# Patient Record
Sex: Male | Born: 1950 | Race: White | Hispanic: No | Marital: Married | Smoking: Former smoker
Health system: Southern US, Community
[De-identification: ages and names within clinical notes are randomized; demographics above are authoritative.]

## PROBLEM LIST (undated history)

## (undated) DIAGNOSIS — I10 Essential (primary) hypertension: Secondary | ICD-10-CM

## (undated) DIAGNOSIS — J45909 Unspecified asthma, uncomplicated: Secondary | ICD-10-CM

## (undated) DIAGNOSIS — J690 Pneumonitis due to inhalation of food and vomit: Secondary | ICD-10-CM

## (undated) DIAGNOSIS — D649 Anemia, unspecified: Secondary | ICD-10-CM

## (undated) DIAGNOSIS — N1831 Chronic kidney disease, stage 3a: Secondary | ICD-10-CM

## (undated) DIAGNOSIS — R079 Chest pain, unspecified: Secondary | ICD-10-CM

## (undated) DIAGNOSIS — J449 Chronic obstructive pulmonary disease, unspecified: Secondary | ICD-10-CM

## (undated) DIAGNOSIS — K219 Gastro-esophageal reflux disease without esophagitis: Secondary | ICD-10-CM

## (undated) DIAGNOSIS — C801 Malignant (primary) neoplasm, unspecified: Secondary | ICD-10-CM

## (undated) DIAGNOSIS — R7303 Prediabetes: Secondary | ICD-10-CM

## (undated) DIAGNOSIS — I2510 Atherosclerotic heart disease of native coronary artery without angina pectoris: Secondary | ICD-10-CM

## (undated) DIAGNOSIS — M869 Osteomyelitis, unspecified: Secondary | ICD-10-CM

## (undated) DIAGNOSIS — Z8719 Personal history of other diseases of the digestive system: Secondary | ICD-10-CM

## (undated) DIAGNOSIS — E785 Hyperlipidemia, unspecified: Secondary | ICD-10-CM

## (undated) DIAGNOSIS — I779 Disorder of arteries and arterioles, unspecified: Secondary | ICD-10-CM

## (undated) DIAGNOSIS — I219 Acute myocardial infarction, unspecified: Secondary | ICD-10-CM

## (undated) DIAGNOSIS — F909 Attention-deficit hyperactivity disorder, unspecified type: Secondary | ICD-10-CM

## (undated) DIAGNOSIS — R911 Solitary pulmonary nodule: Secondary | ICD-10-CM

## (undated) DIAGNOSIS — M2550 Pain in unspecified joint: Secondary | ICD-10-CM

## (undated) DIAGNOSIS — Z87442 Personal history of urinary calculi: Secondary | ICD-10-CM

## (undated) DIAGNOSIS — I00 Rheumatic fever without heart involvement: Secondary | ICD-10-CM

## (undated) DIAGNOSIS — M810 Age-related osteoporosis without current pathological fracture: Secondary | ICD-10-CM

## (undated) HISTORY — DX: Pain in unspecified joint: M25.50

## (undated) HISTORY — PX: COLONOSCOPY WITH ESOPHAGOGASTRODUODENOSCOPY (EGD): SHX5779

## (undated) HISTORY — PX: CYSTOSCOPY/RETROGRADE/URETEROSCOPY/STONE EXTRACTION WITH BASKET: SHX5317

## (undated) HISTORY — PX: OTHER SURGICAL HISTORY: SHX169

## (undated) HISTORY — DX: Osteomyelitis, unspecified: M86.9

## (undated) HISTORY — DX: Chest pain, unspecified: R07.9

## (undated) HISTORY — DX: Rheumatic fever without heart involvement: I00

## (undated) HISTORY — PX: ESOPHAGECTOMY: SUR457

---

## 2007-02-18 ENCOUNTER — Ambulatory Visit (HOSPITAL_COMMUNITY): Admission: RE | Admit: 2007-02-18 | Discharge: 2007-02-18 | Payer: Self-pay | Admitting: Urology

## 2007-02-18 IMAGING — CR DG ABDOMEN 1V
1 series · 1 of 1 positions shown · non-contrast
Comparison: none

HISTORY: Left ureteral calculus, pre lithotripsy

[view not recorded]
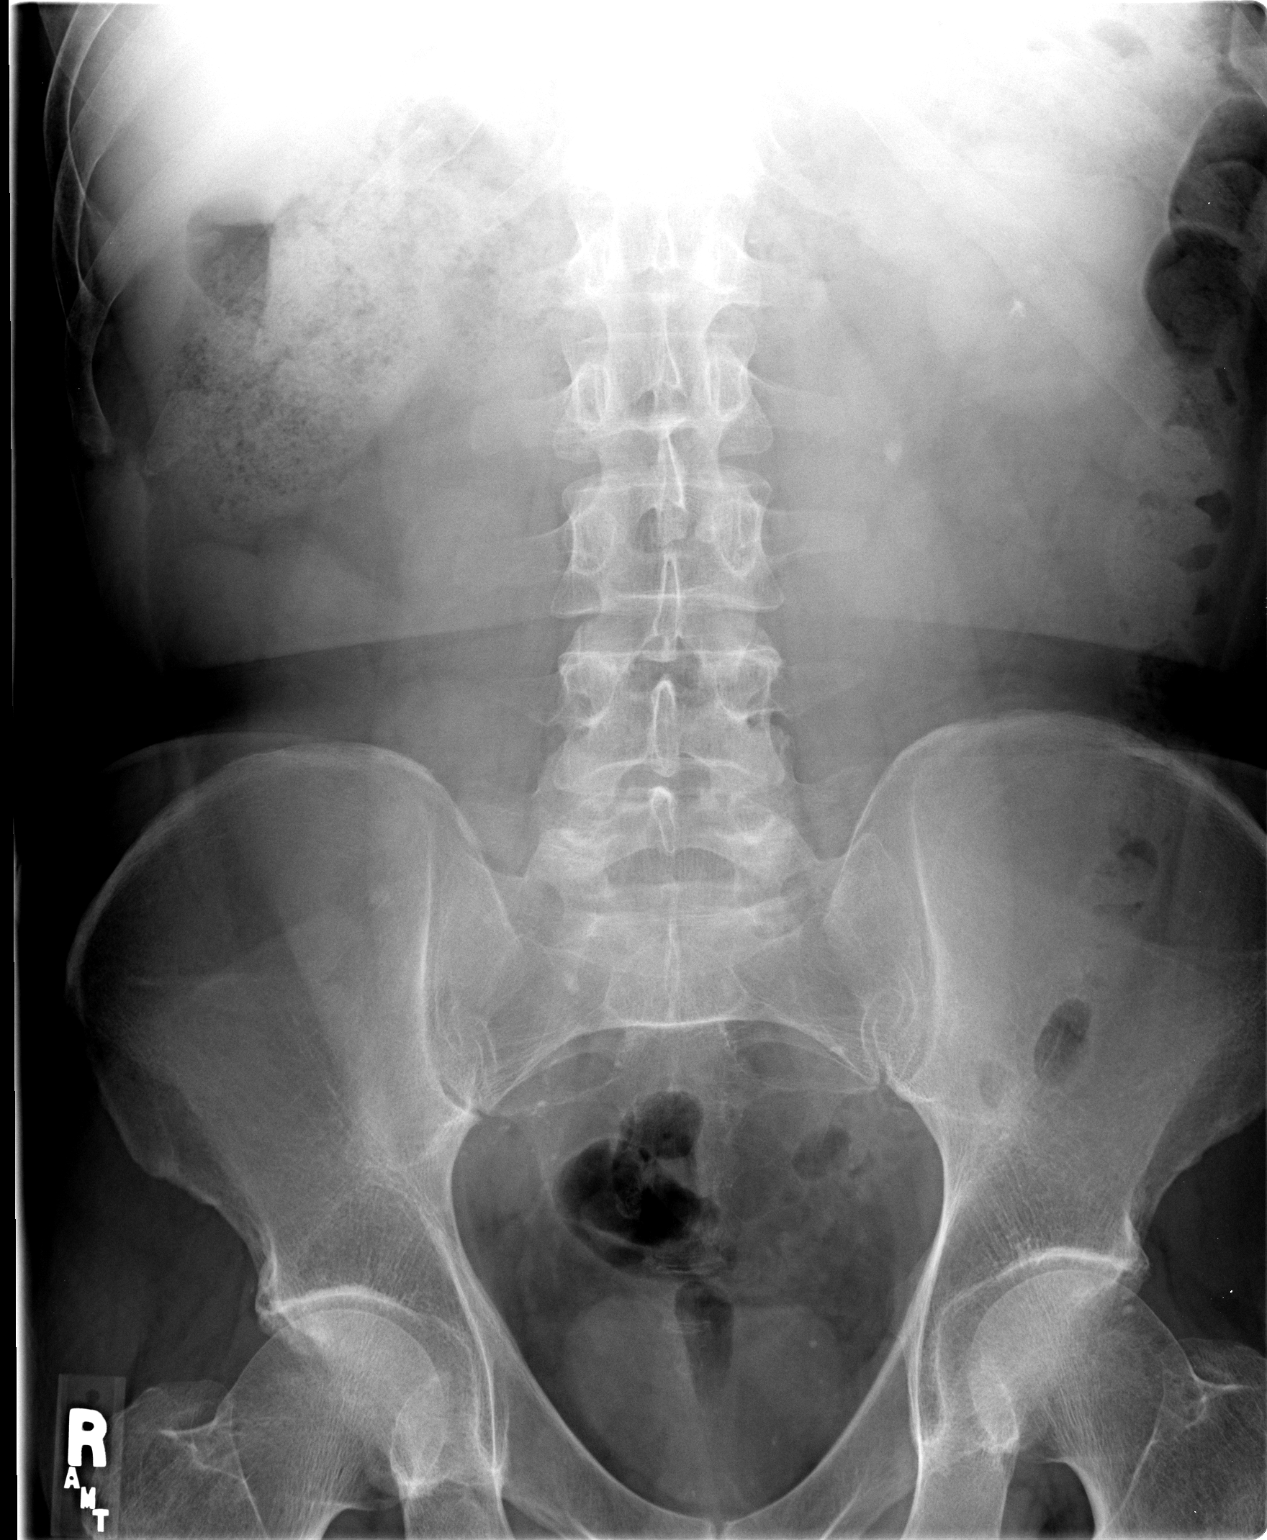

[1 of 1 positions shown; findings below may reference images not displayed]

ABDOMEN ONE VIEW:

6 mm diameter calculus left paraspinal at the L2-L3 disc space LEVEL,  either AT
left ureteropelvic junction or very proximal left ureter.
Tiny pelvic phleboliths.
Additional calculus in left kidney, 5 x 5 mm.
Question additional calculus 3 mm diameter mid left kidney.
Mildly demineralization.
Calcification projects over the right sacrum, uncertain etiology, 6 mm diameter.
Bowel gas pattern normal.
IMPRESSION: Two small left renal calculi with 6 mm diameter calculus at either the left
renal pelvic junction or proximal left ureter.

## 2007-02-20 ENCOUNTER — Ambulatory Visit (HOSPITAL_COMMUNITY): Admission: RE | Admit: 2007-02-20 | Discharge: 2007-02-20 | Payer: Self-pay | Admitting: Urology

## 2007-04-27 ENCOUNTER — Ambulatory Visit (HOSPITAL_COMMUNITY): Admission: RE | Admit: 2007-04-27 | Discharge: 2007-04-27 | Payer: Self-pay | Admitting: Urology

## 2007-04-27 IMAGING — CR DG ABDOMEN 1V
2 series · 2 of 2 positions shown · non-contrast
Comparison: [DATE]

CLINICAL DATA: Left ureteral calculus

ABDOMEN - 1 VIEW

[view not recorded (1 of 2)]
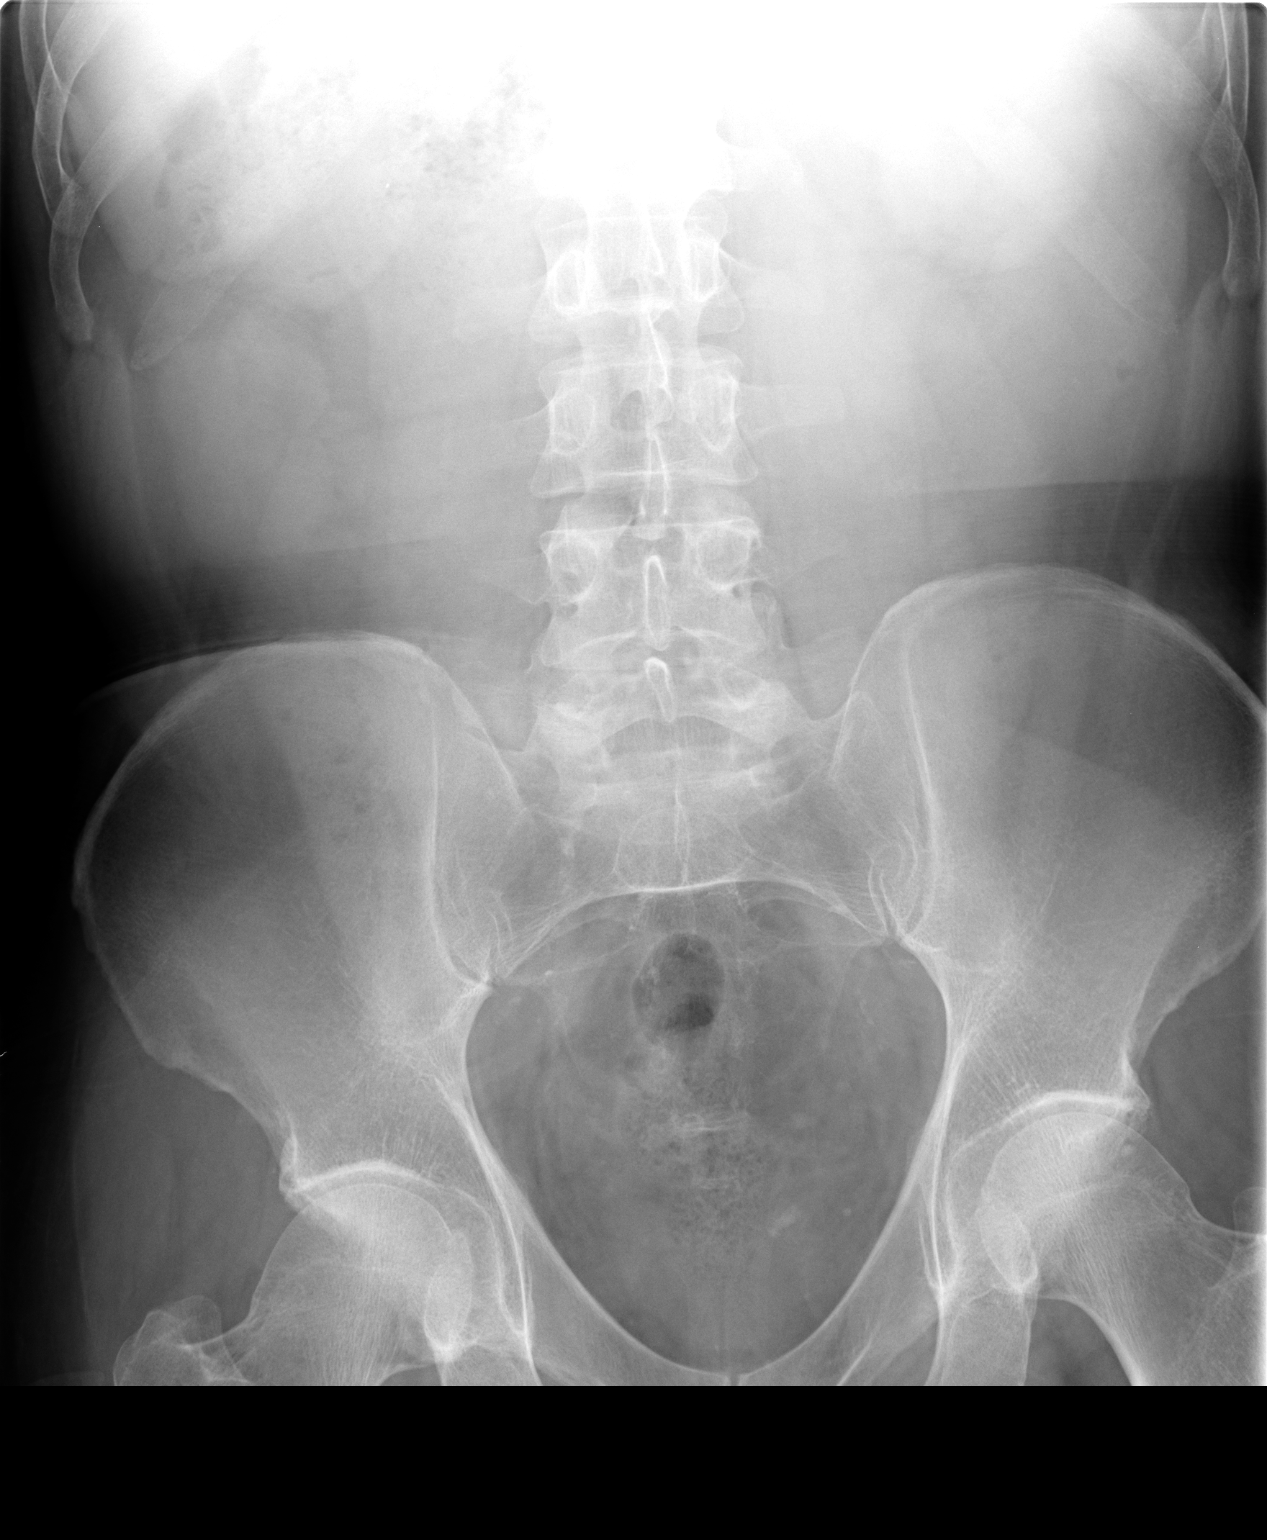

[view not recorded (2 of 2)]
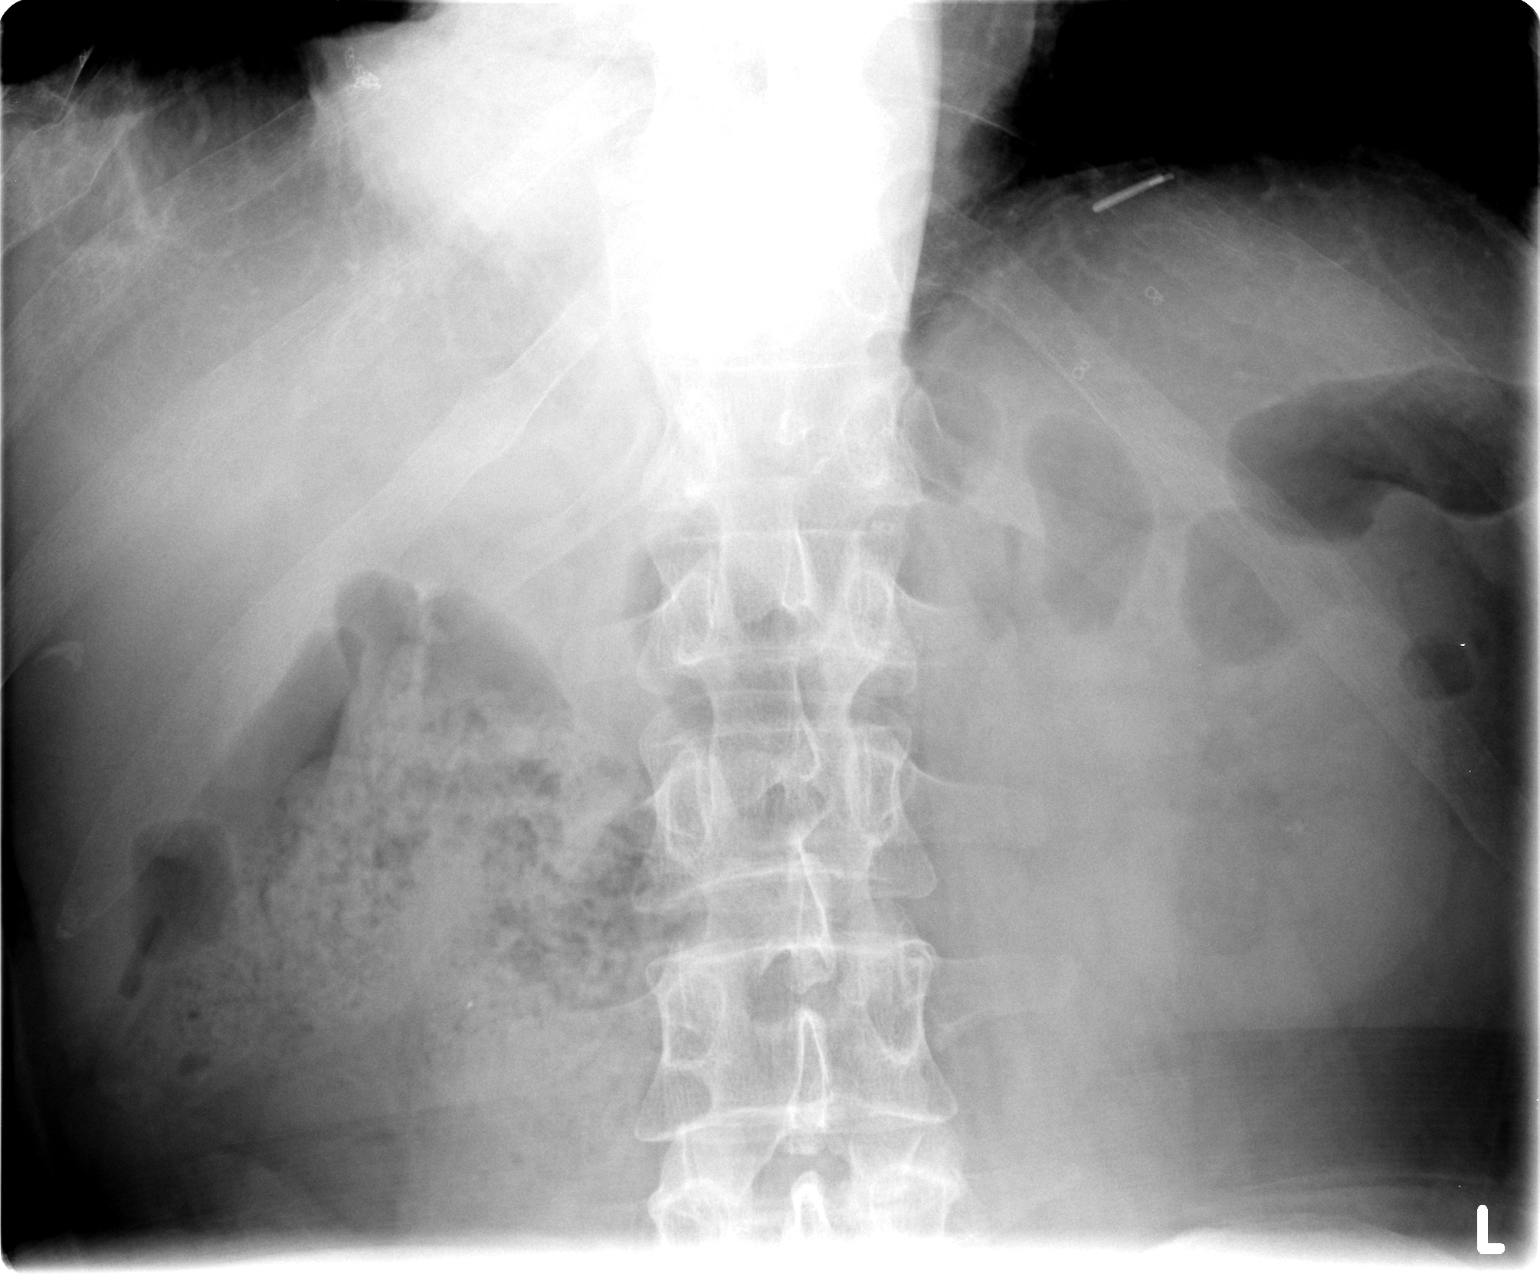

[2 of 2 positions shown; findings below may reference images not displayed]

FINDINGS: Small pelvic phleboliths.
Additional calcifications in left pelvis are new since previous
exam compatible with distal left ureteral calculi, measuring up to
7 x 4 and 2 x 2 mm.
Minimal vascular calcification.
Tiny calculus 2-3 mm diameter in mid left kidney.
No definite right side calculi, stool artifacts projecting over
right renal fossa.
Bony demineralization.
Bowel gas pattern normal.
IMPRESSION: Suspect two small distal left ureteral calculi.

## 2007-05-04 ENCOUNTER — Ambulatory Visit (HOSPITAL_COMMUNITY): Admission: RE | Admit: 2007-05-04 | Discharge: 2007-05-04 | Payer: Self-pay | Admitting: Urology

## 2007-05-04 IMAGING — CR DG ABDOMEN 1V
1 series · 1 of 1 positions shown · non-contrast
Comparison: [DATE]

CLINICAL DATA: Left ureteral calculus

ABDOMEN - 1 VIEW

[view not recorded]
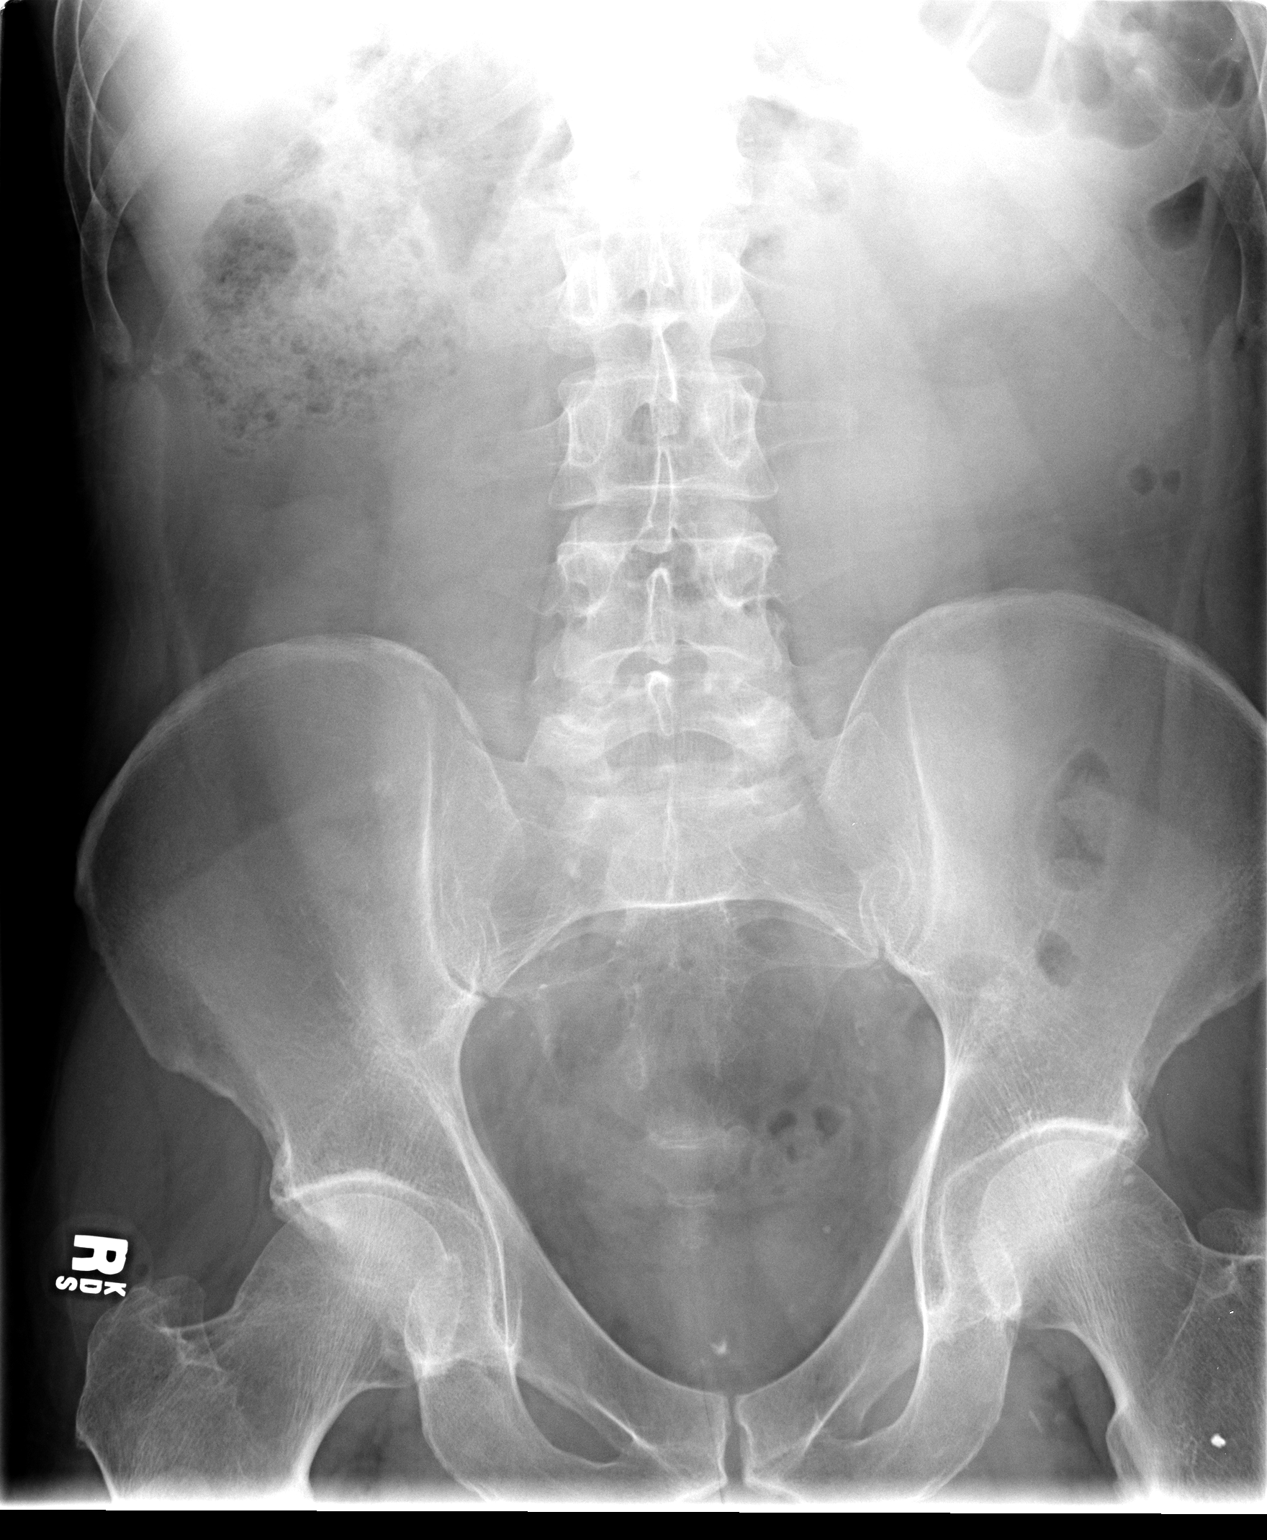

[1 of 1 positions shown; findings below may reference images not displayed]

FINDINGS: Previously identified tiny calculi in distal left ureter are no
longer seen at that site.
Single new calcification is seen dependently at the base of the
bladder which may represent passed stone within the bladder lumen,
not seen previously, measuring 5 x 3 mm.
Minimal vascular calcification.
Diffuse bony demineralization.
Bowel gas pattern normal.
IMPRESSION: Previously seen distal left ureteral calculus no longer identified,
with questionable visualization of a passed calculus in urinary
bladder.

## 2007-08-20 ENCOUNTER — Ambulatory Visit (HOSPITAL_COMMUNITY): Admission: RE | Admit: 2007-08-20 | Discharge: 2007-08-20 | Payer: Self-pay | Admitting: Urology

## 2008-08-29 ENCOUNTER — Ambulatory Visit (HOSPITAL_COMMUNITY): Admission: RE | Admit: 2008-08-29 | Discharge: 2008-08-29 | Payer: Self-pay | Admitting: Urology

## 2008-08-29 IMAGING — CR DG ABDOMEN 1V
1 series · 1 of 1 positions shown · non-contrast
Comparison: [DATE] and earlier.

CLINICAL DATA: 58-year-old male with right ureteral calculus.

ABDOMEN - 1 VIEW

[view not recorded]
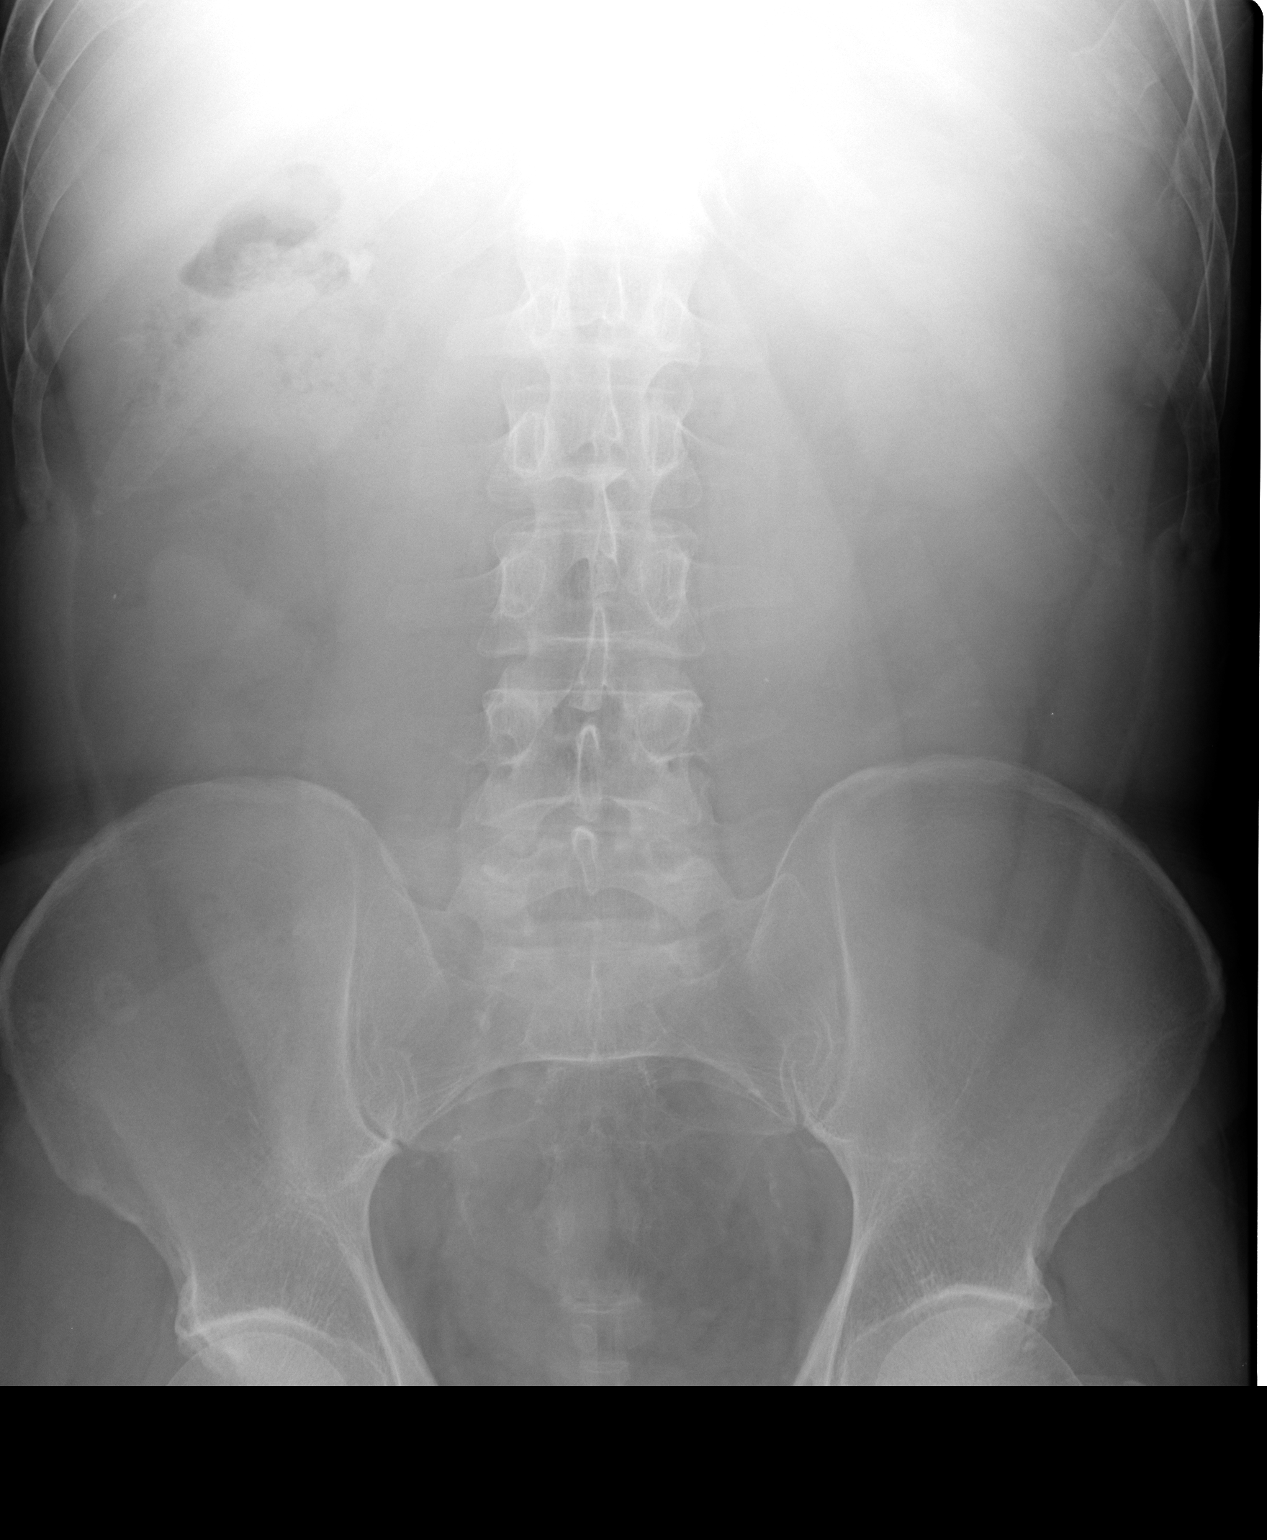

[1 of 1 positions shown; findings below may reference images not displayed]

FINDINGS: Supine AP view of the abdomen.  Paucity of bowel gas.
Visceral contours are within normal limits.  Right nephrolithiasis
suspected measuring 9 x 7 mm.  Alternatively, this could be
artifact due to enteric contents of the hepatic flexure.  Stable
small sclerotic foci, probably related to calcified
atherosclerosis, projecting over the right hemi sacrum.  No
discrete ureteral calculus identified.  No definite left
nephrolithiasis. No acute osseous abnormality identified.
IMPRESSION: 1.  No definite ureteral calculus identified, correlation with
cross sectional imaging would be helpful.
2.  Right nephrolithiasis suspected measuring 7 x 9 mm at the level
of the mid pole.

## 2008-09-02 ENCOUNTER — Ambulatory Visit (HOSPITAL_COMMUNITY): Admission: RE | Admit: 2008-09-02 | Discharge: 2008-09-02 | Payer: Self-pay | Admitting: Urology

## 2008-09-02 IMAGING — US US RENAL
1 series · 14 of 22 positions shown · non-contrast
Comparison: KUB [DATE]

CLINICAL DATA: Right renal stone.

RENAL/URINARY TRACT ULTRASOUND COMPLETE

[Series 1: unknown · 0.27mm/px · 14 of 22 slices shown]
[im 1/22]
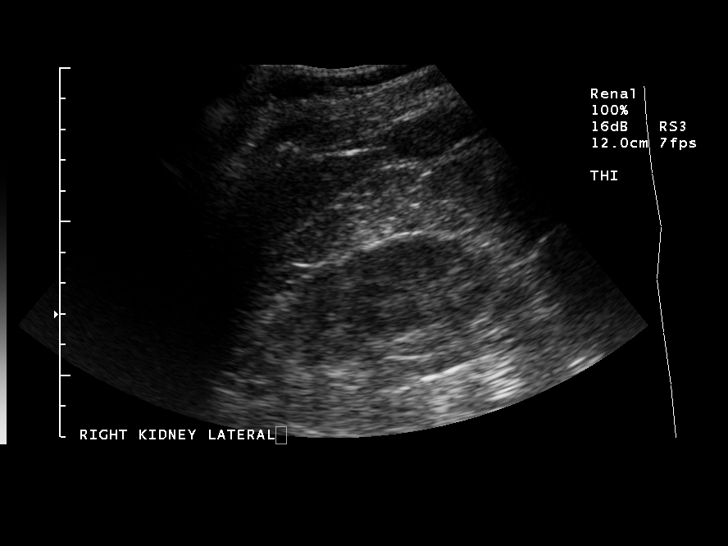
[im 3/22]
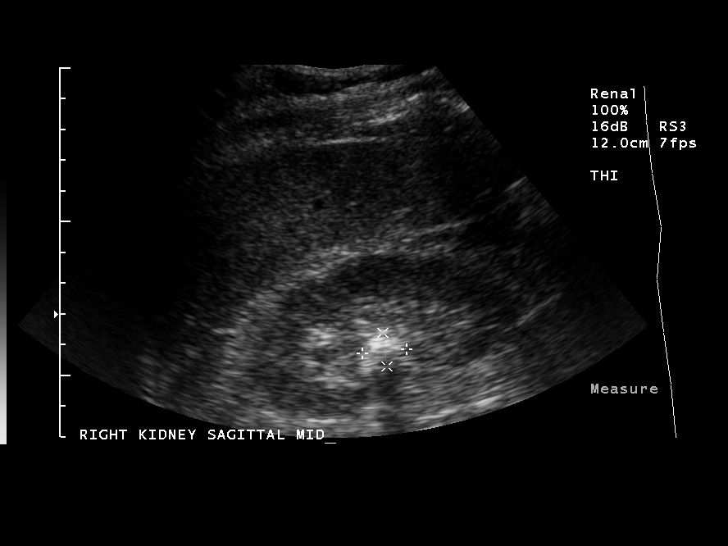
[im 4/22]
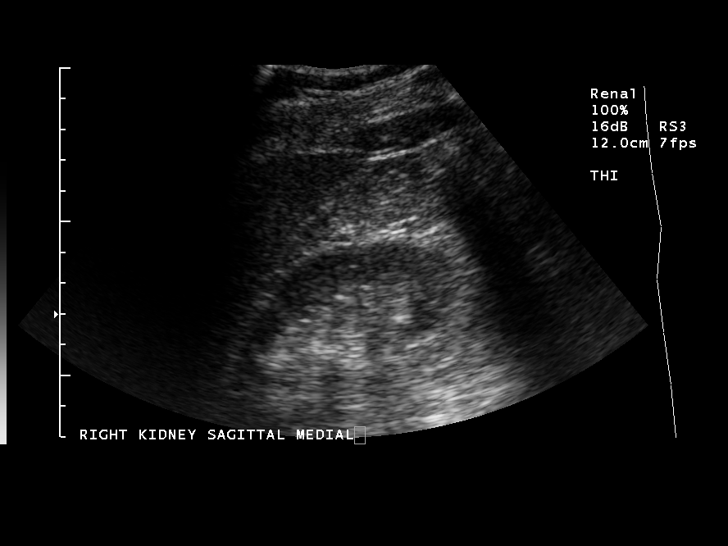
[im 6/22]
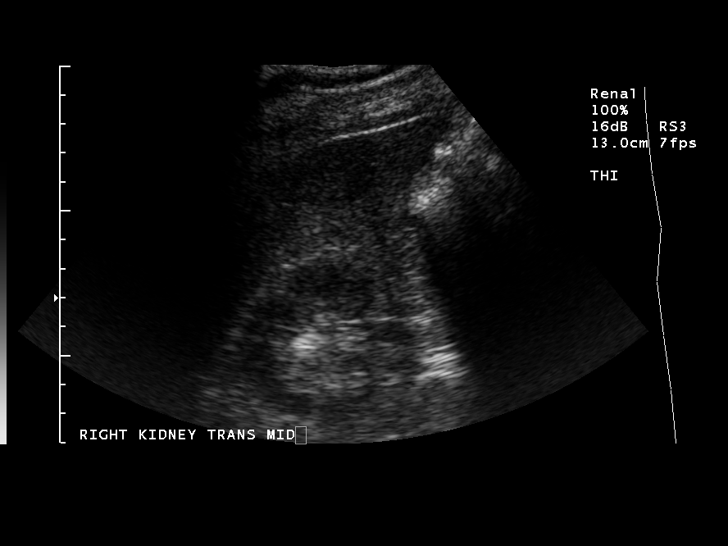
[im 8/22]
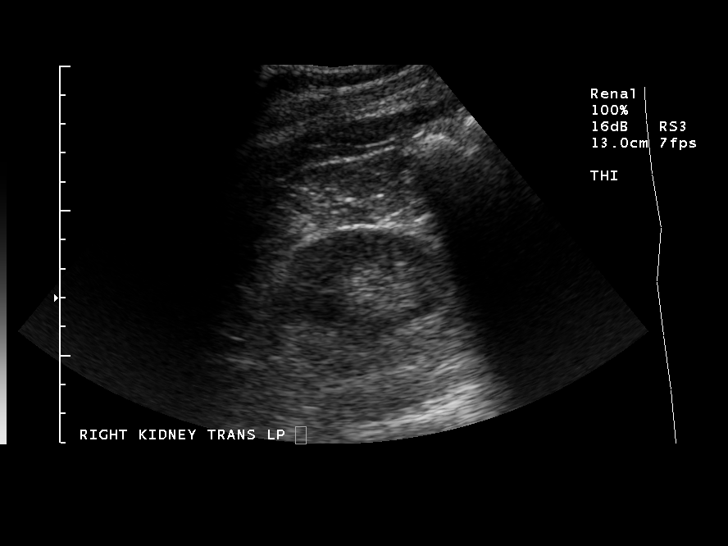
[im 9/22]
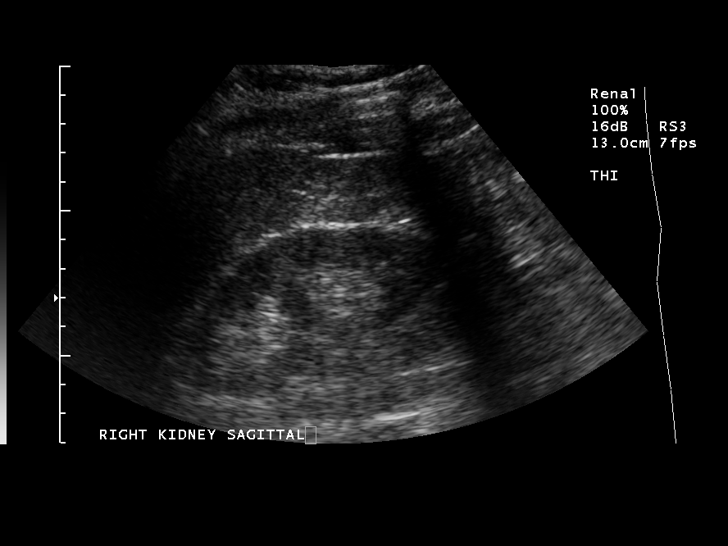
[im 11/22]
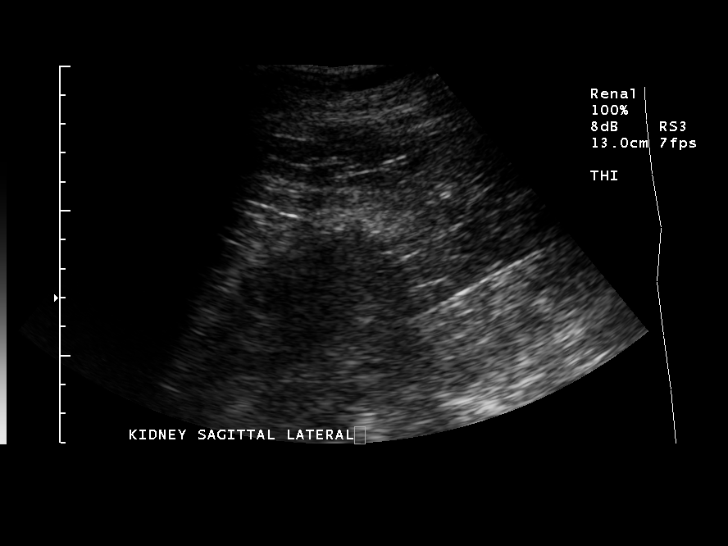
[im 12/22]
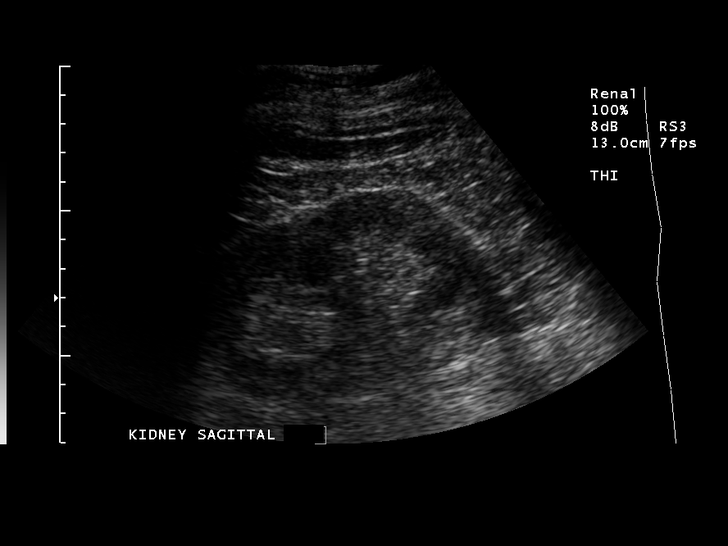
[im 14/22]
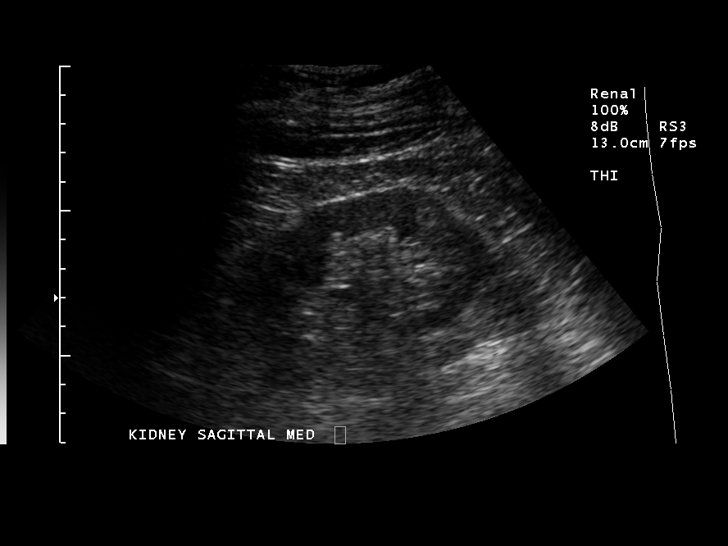
[im 15/22]
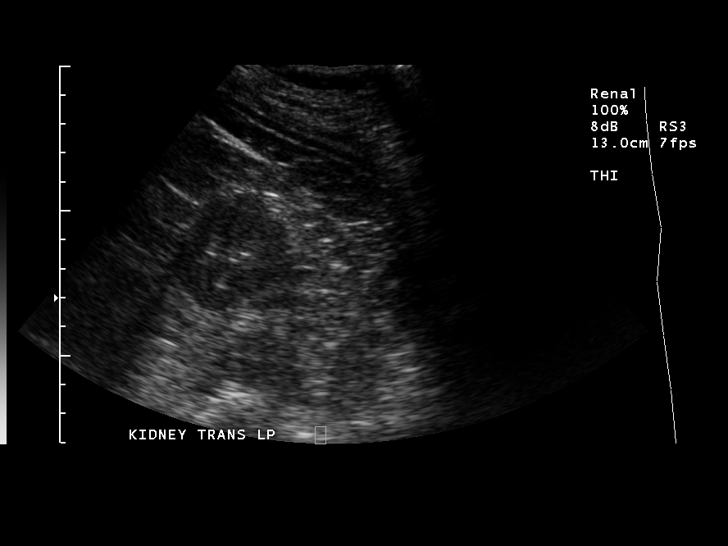
[im 17/22]
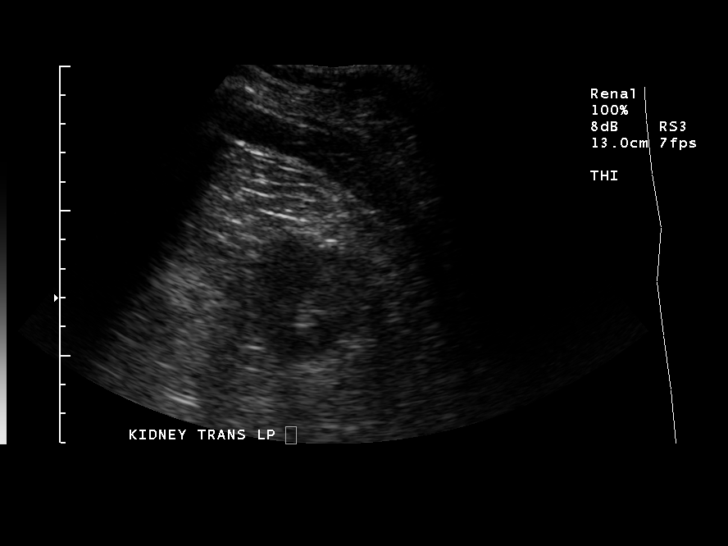
[im 19/22]
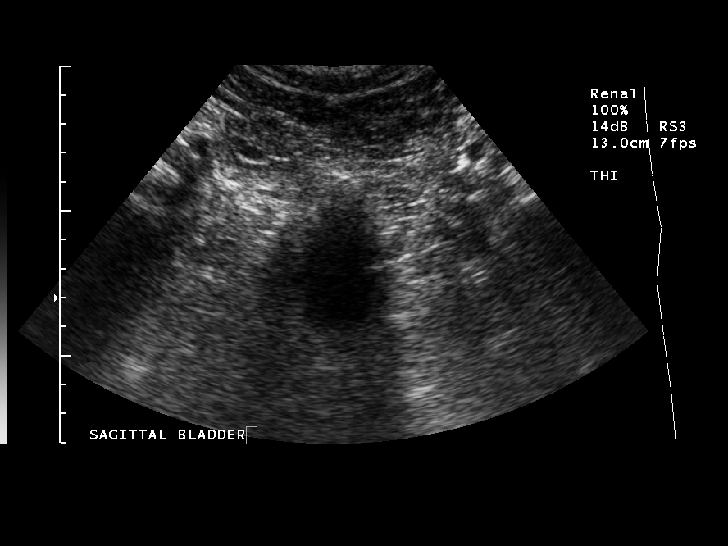
[im 20/22]
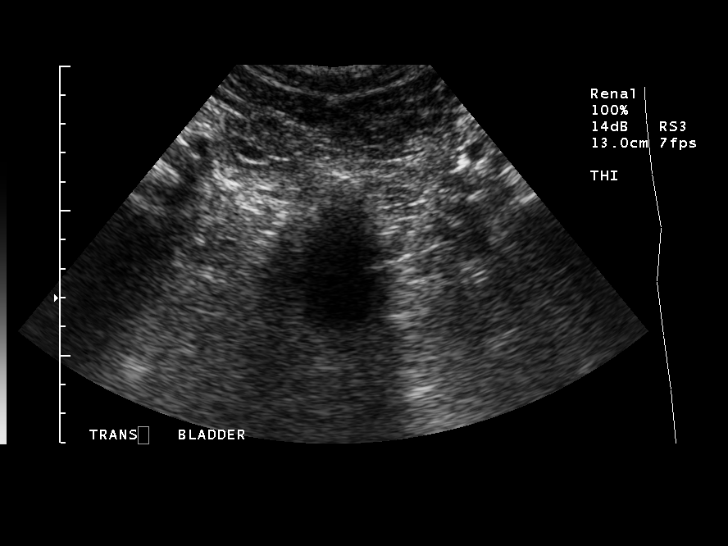
[im 22/22]
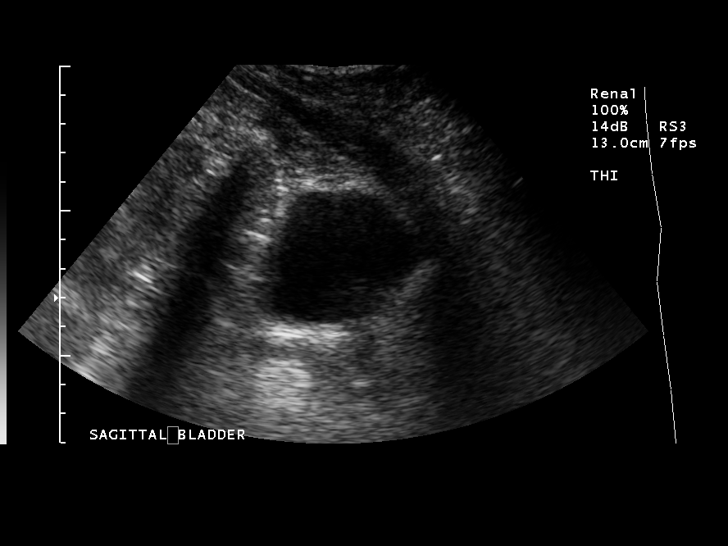

[14 of 22 positions shown; findings below may reference images not displayed]

FINDINGS: Right Kidney:  Measures 10.1 cm.  A 1.4 x 0.8 x 1.4 cm echogenic
lesion with posterior acoustic shadowing is seen in the mid pole of
the right kidney.  No obstruction.

Left Kidney:  Measures 10.1 cm, negative.

Bladder:  Under distended, otherwise unremarkable.
IMPRESSION: Nonobstructing right renal stone.

## 2008-10-24 ENCOUNTER — Ambulatory Visit (HOSPITAL_COMMUNITY): Admission: RE | Admit: 2008-10-24 | Discharge: 2008-10-24 | Payer: Self-pay | Admitting: Urology

## 2008-10-24 IMAGING — CR DG ABDOMEN 1V
1 series · 1 of 1 positions shown · non-contrast
Comparison: Renal ultrasound [DATE].  Abdominal plain film
[DATE].

CLINICAL DATA: Right flank pain.  History of kidney stones.

ABDOMEN - 1 VIEW

[view not recorded]
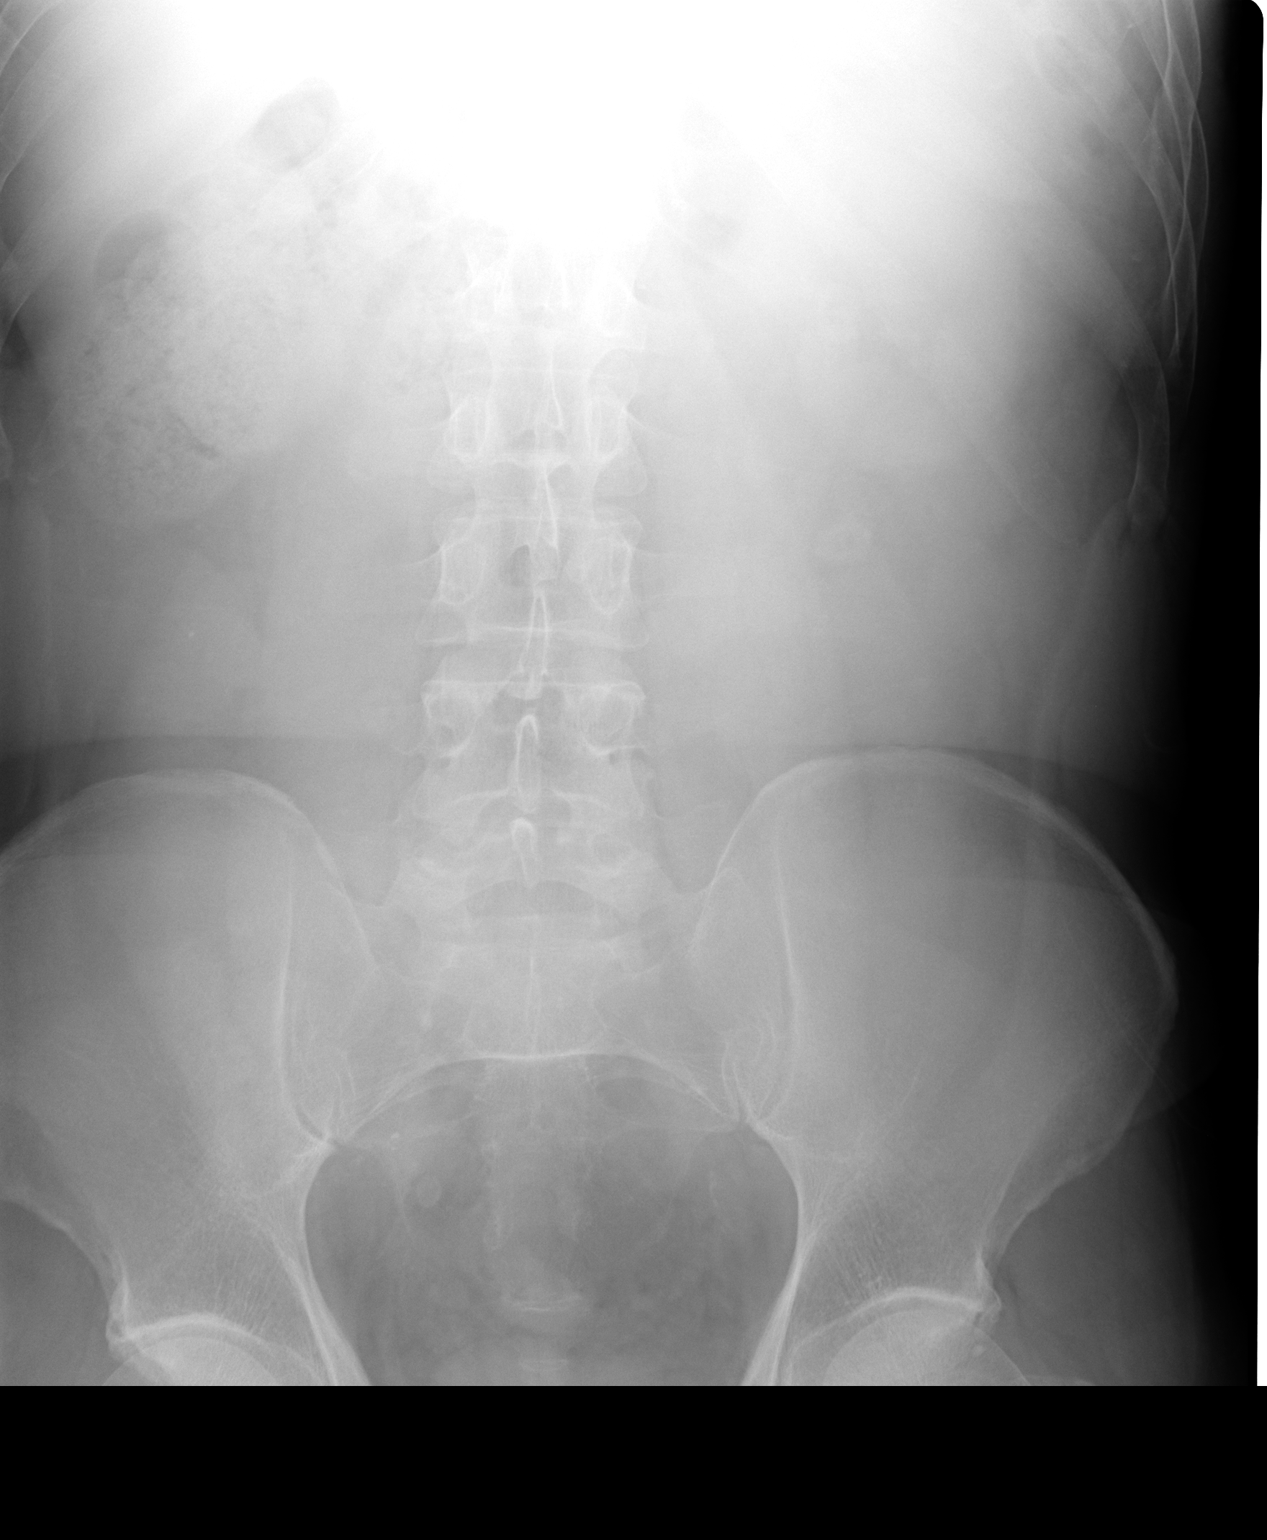

[1 of 1 positions shown; findings below may reference images not displayed]

FINDINGS: No radiopaque stones are identified within the abdomen.
There is a peripherally calcified density in the anatomic pelvis
which likely represents a phlebolith.  There is stool overlying the
right kidney.
IMPRESSION: 1.  No definite radiopaque stones identified over the expected
location of the right kidney or ureter.
2.  Rounded calcification in the anatomic pelvis likely reflects a
phlebolith.

## 2009-08-21 ENCOUNTER — Ambulatory Visit (HOSPITAL_COMMUNITY): Admission: RE | Admit: 2009-08-21 | Discharge: 2009-08-21 | Payer: Self-pay | Admitting: Urology

## 2009-08-21 IMAGING — CR DG ABDOMEN 1V
2 series · 2 of 2 positions shown · non-contrast
Comparison: [DATE]

CLINICAL DATA: Kidney stone.

ABDOMEN - 1 VIEW

[view not recorded (1 of 2)]
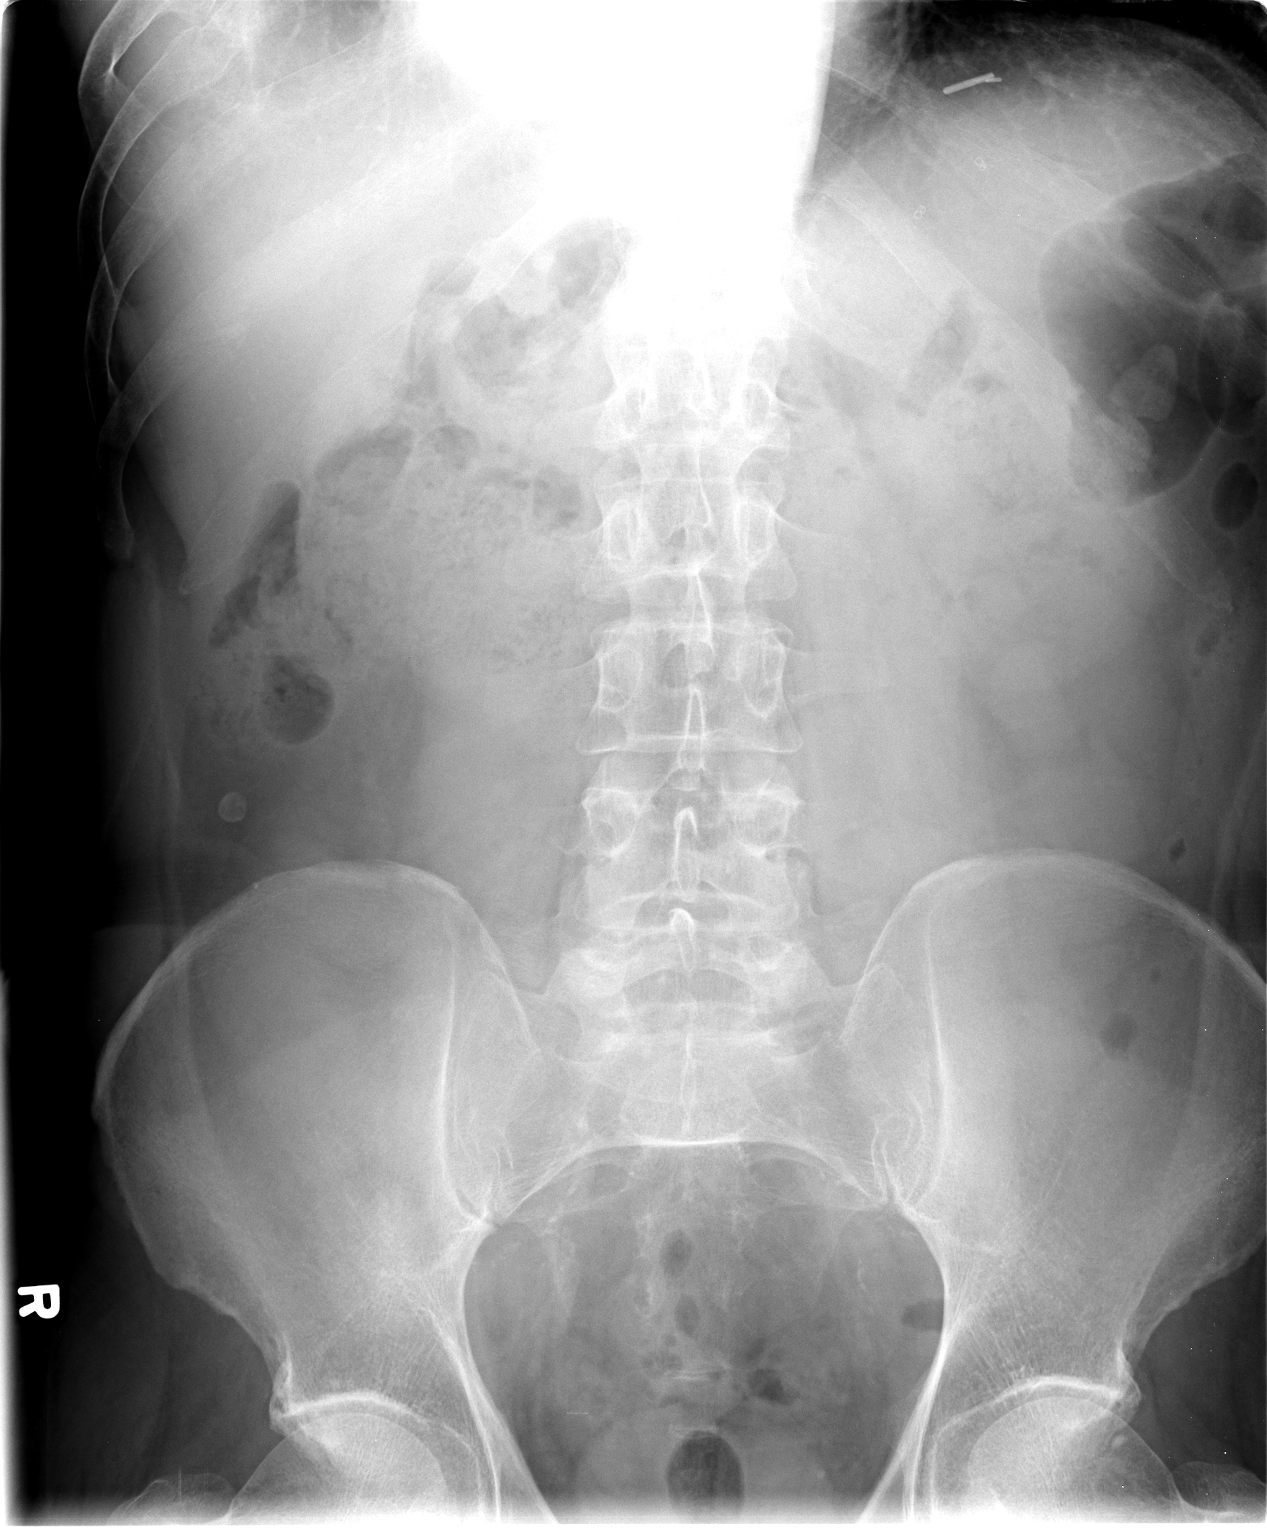

[view not recorded (2 of 2)]
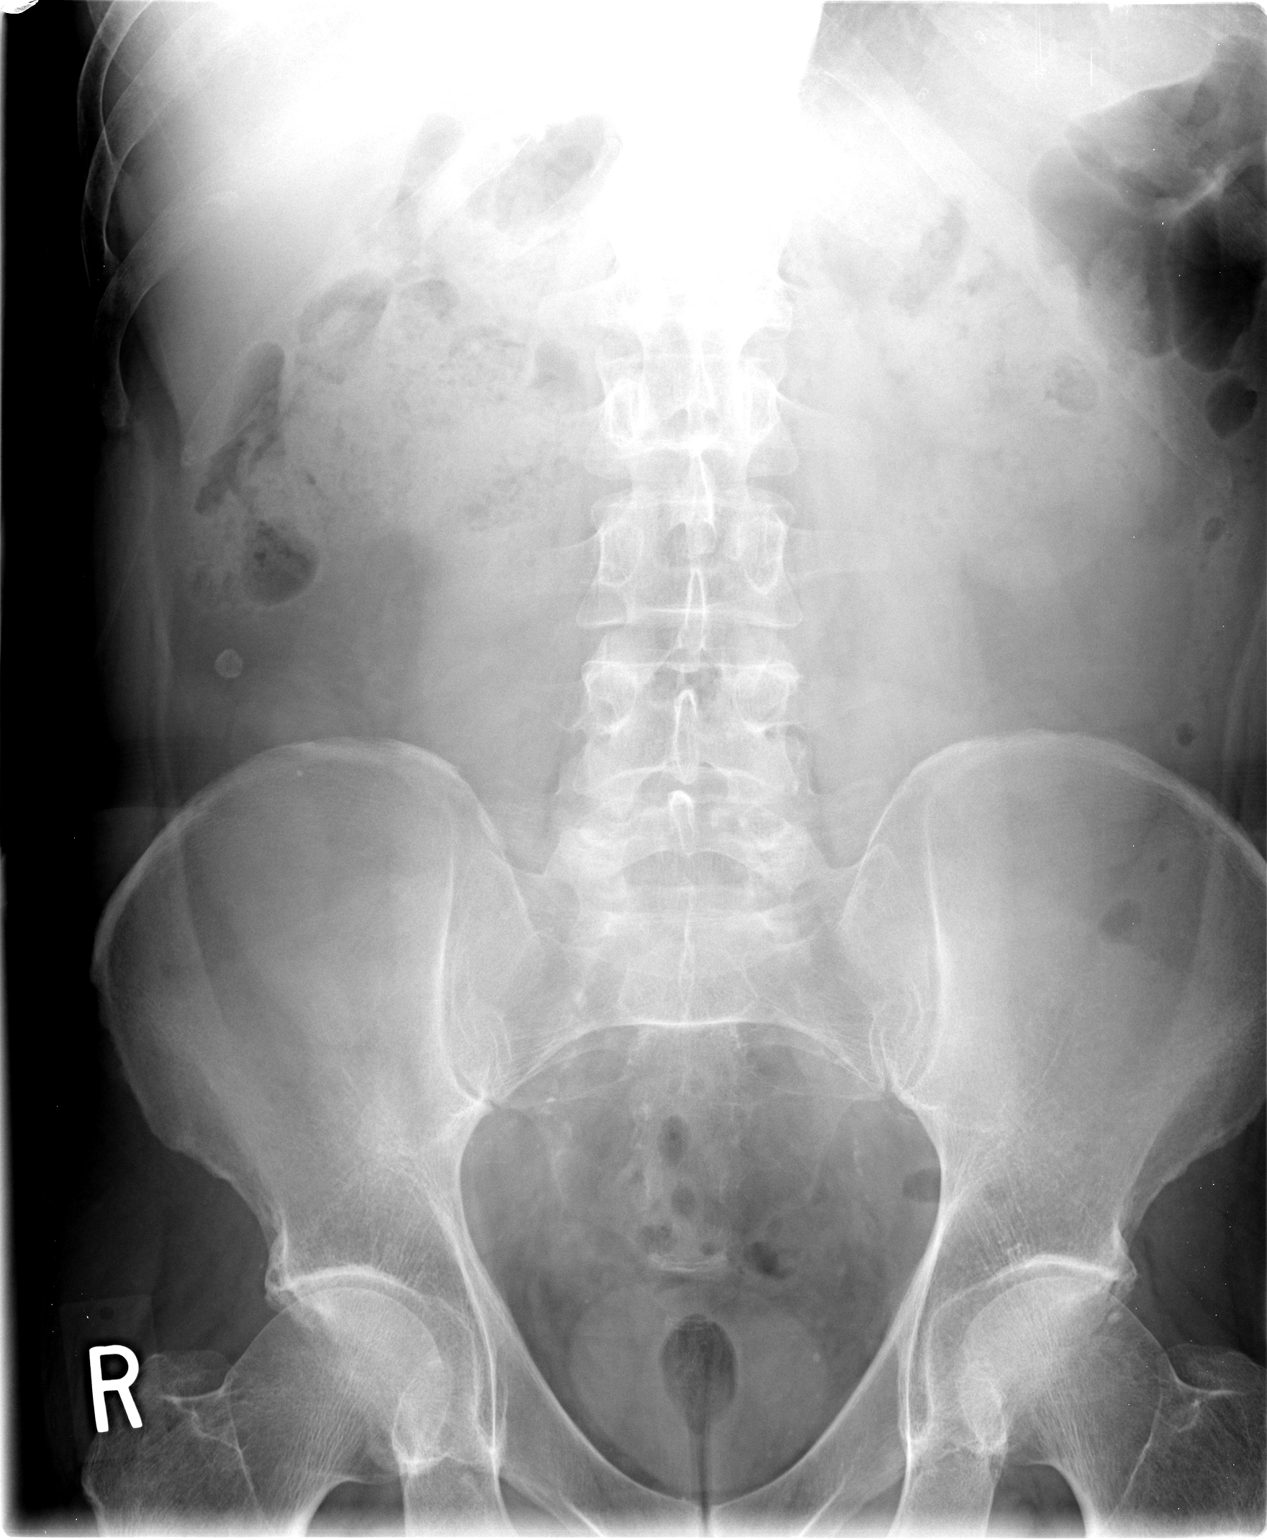

[2 of 2 positions shown; findings below may reference images not displayed]

FINDINGS: No definite radiopaque calcifications over the kidneys or
expected course of the ureters.  Stable phlebolith within the left
anatomic pelvis.  There is a normal bowel gas pattern.  No free
air, organomegaly or suspicious calcification.
IMPRESSION: No definite urinary tract calculi.  No acute findings.

## 2010-02-19 ENCOUNTER — Other Ambulatory Visit (HOSPITAL_COMMUNITY): Payer: Self-pay | Admitting: Urology

## 2010-02-19 ENCOUNTER — Ambulatory Visit (HOSPITAL_COMMUNITY)
Admission: RE | Admit: 2010-02-19 | Discharge: 2010-02-19 | Disposition: A | Discharge status: Home / Self Care | Payer: 59 | Source: Ambulatory Visit | Attending: Urology | Admitting: Urology

## 2010-02-19 DIAGNOSIS — R109 Unspecified abdominal pain: Secondary | ICD-10-CM | POA: Insufficient documentation

## 2010-02-19 DIAGNOSIS — N200 Calculus of kidney: Secondary | ICD-10-CM

## 2010-02-19 IMAGING — CR DG ABDOMEN 1V
2 series · 2 of 2 positions shown · non-contrast
Comparison: [DATE]

CLINICAL DATA: Right renal calculus

ABDOMEN - 1 VIEW

[view not recorded (1 of 2)]
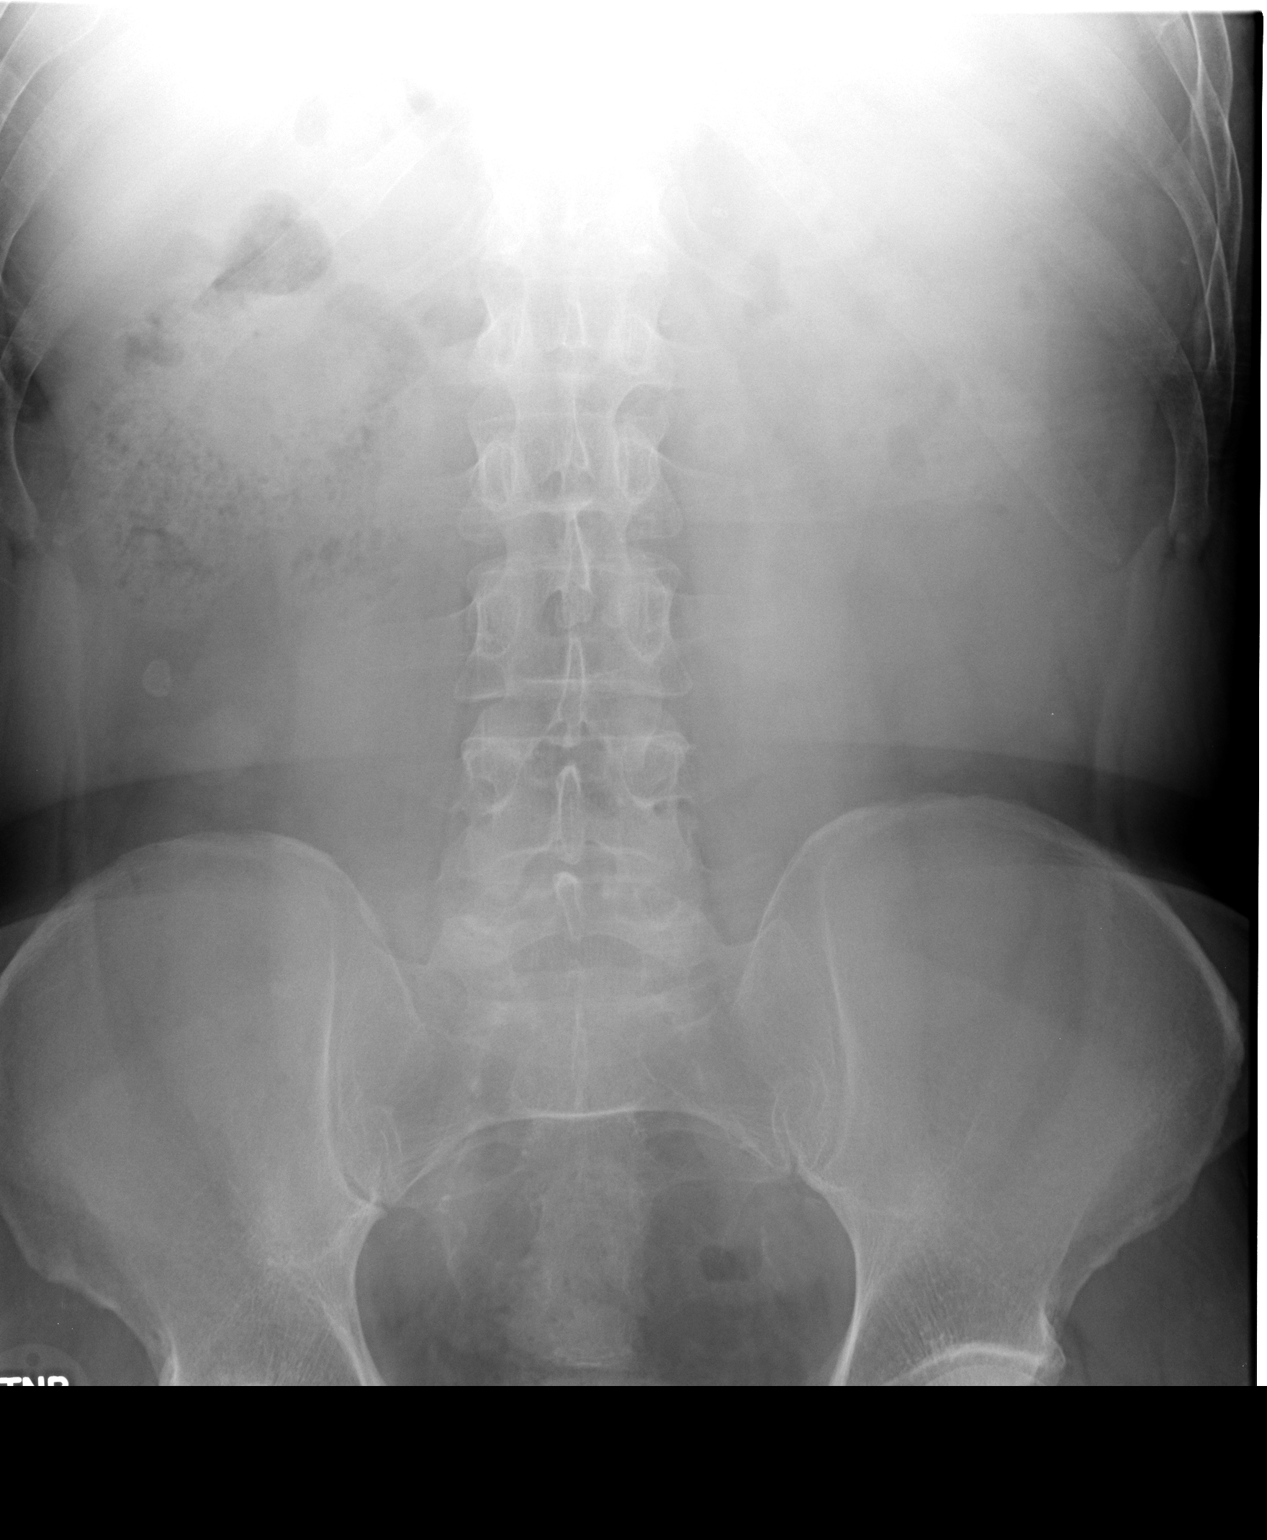

[view not recorded (2 of 2)]
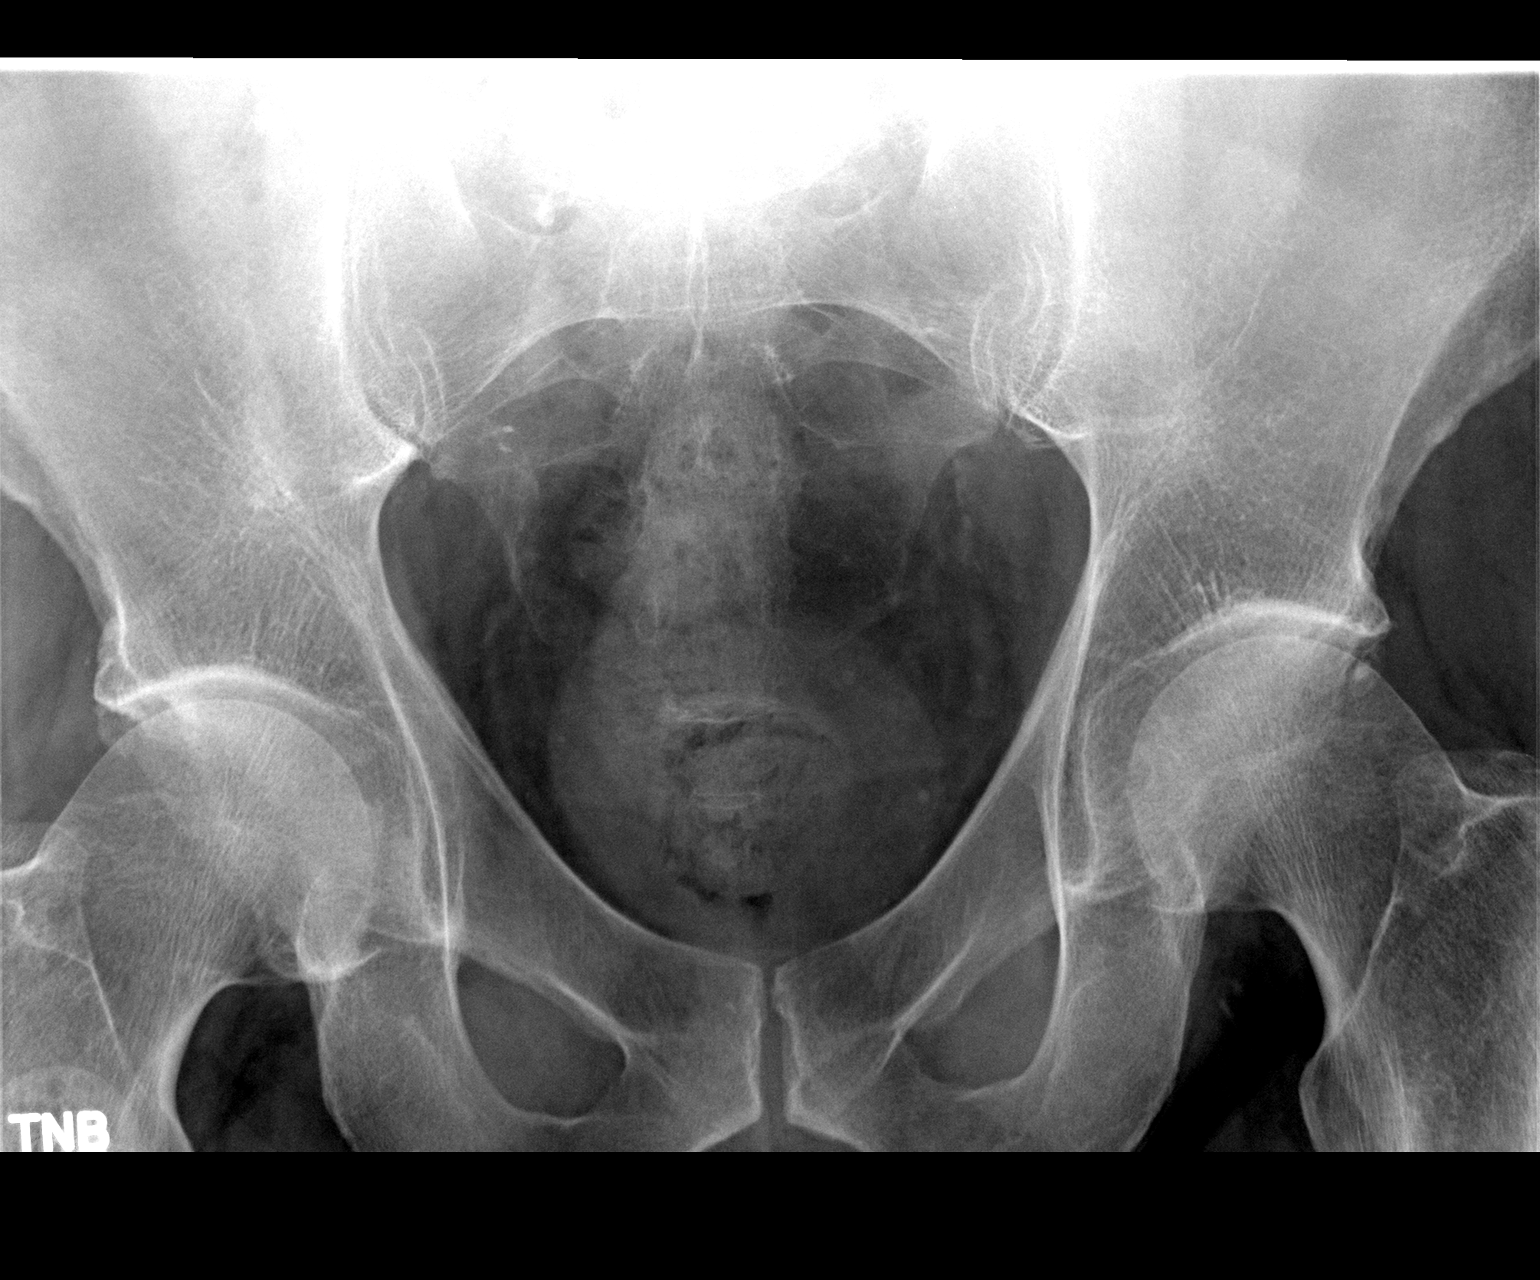

[2 of 2 positions shown; findings below may reference images not displayed]

FINDINGS: Calcification of the right abdomen could represent a
phlebolith, appendicolith, or remote fat necrosis.

A faint rounded radiodensity projects between the spinous processes
of L1-L2 on the left.  This is probably some form a button.

Vascular calcifications noted in the pelvis.

I do not discern a definite renal calculus or ureteral calculus.
IMPRESSION: 1.  No definite urinary tract calculus is identified.

## 2010-05-22 NOTE — H&P (Signed)
NAMEDUVALL, COMES              ACCOUNT NO.:  [id number]   MEDICAL RECORD NO.:  [id number]          PATIENT TYPE:  AMB   LOCATION:  DAY                           FACILITY:  [facility]   PHYSICIAN:  Dennie Maizes, M.D.   DATE OF BIRTH:  1950-04-02   DATE OF ADMISSION:  02/18/2007  DATE OF DISCHARGE:  LH                              HISTORY & PHYSICAL   CHIEF COMPLAINT:  Severe left flank pain, left upper ureteral calculus  with obstruction.   HISTORY OF PRESENT ILLNESS:  This 60 year old male has a past history of  recurrent urolithiasis.  Undergone multiple lithotripsies in the past.  Last lithotripsy was done in June 2007.  He has been having intermittent  severe left flank pain, and he was evaluated in the emergency room at  [facility].  CT scan of abdomen and pelvis without  contrast revealed a 6 mm size left proximal ureteral calculus with  obstruction and hydronephrosis.  The patient also has a 4-mm size  nonobstructing right renal calculus.  Denied having any fever, chills,  voiding difficulty or gross hematuria at present.  He is brought to the  short stay center today for extracorporeal shock wave lithotripsy of  obstructing left upper ureteral calculus.   PAST MEDICAL HISTORY:  1. Status post multiple lithotripsies for recurrent urolithiasis.  2. History of aspiration pneumonia and acute respiratory failure in      June 2007 after stent placement.  3. History of osteoporosis.  4. Gastroesophageal reflux disease.  5. Sliding hiatal hernia.  6. Hypertension.  7. Status post esophageal surgery in 1995.   MEDICATIONS:  1. Micardis 40 mg p.o. daily.  2. Nexium 40 mg p.o. b.i.d.   ALLERGIES:  None.   PHYSICAL EXAMINATION:  HEAD, EYES, EARS, NOSE AND THROAT:  Normal.  NECK:  No masses.  LUNGS:  Clear to auscultation.  HEART:  Regular rate and rhythm.  No murmurs.  ABDOMEN:  Soft. No palpable flank mass.  Mild left costovertebral angle  tenderness is  noted.  Bladder was not palpable.  GU:  Penis and testes are normal.   IMPRESSION:  1. Left upper ureteral calculus with obstruction.  2. Left renal colic.  3. Left hydronephrosis.  4. Nonobstructing right renal calculus.   PLAN:  Extracorporeal shock wave lithotripsy of left upper ureteral  calculus with IV sedation in the hospital in the short-stay center.  I  explained to the patient regarding diagnosis, operative details,  alternative treatments, outcome, possible risks and complications.  They  have agreed for the procedure to be done.  Due to gastroesophageal  reflux disease, he has a risk of aspiration pneumonia, and this was  discussed with the patient and his family.      Dennie Maizes, M.D.  Electronically Signed     SK/MEDQ  D:  02/18/2007  T:  02/18/2007  Job:  [id number]   cc:   [facility]  Fax: [phone]

## 2011-03-21 ENCOUNTER — Ambulatory Visit (HOSPITAL_COMMUNITY)
Admission: RE | Admit: 2011-03-21 | Discharge: 2011-03-21 | Disposition: A | Discharge status: Home / Self Care | Payer: 59 | Source: Ambulatory Visit | Attending: Urology | Admitting: Urology

## 2011-03-21 ENCOUNTER — Other Ambulatory Visit (HOSPITAL_COMMUNITY): Payer: Self-pay | Admitting: Urology

## 2011-03-21 DIAGNOSIS — N200 Calculus of kidney: Secondary | ICD-10-CM | POA: Insufficient documentation

## 2011-03-21 DIAGNOSIS — N201 Calculus of ureter: Secondary | ICD-10-CM

## 2011-03-21 IMAGING — CR DG ABDOMEN 1V
2 series · 2 of 2 positions shown · non-contrast
Comparison: CT scan dated [DATE] and abdominal radiographs
dated [DATE] and [DATE].

CLINICAL DATA: Bilateral renal calculi.  Right lithotripsy 2 weeks
ago.  Right ureteral calculus.

ABDOMEN - 1 VIEW

[view not recorded (1 of 2)]
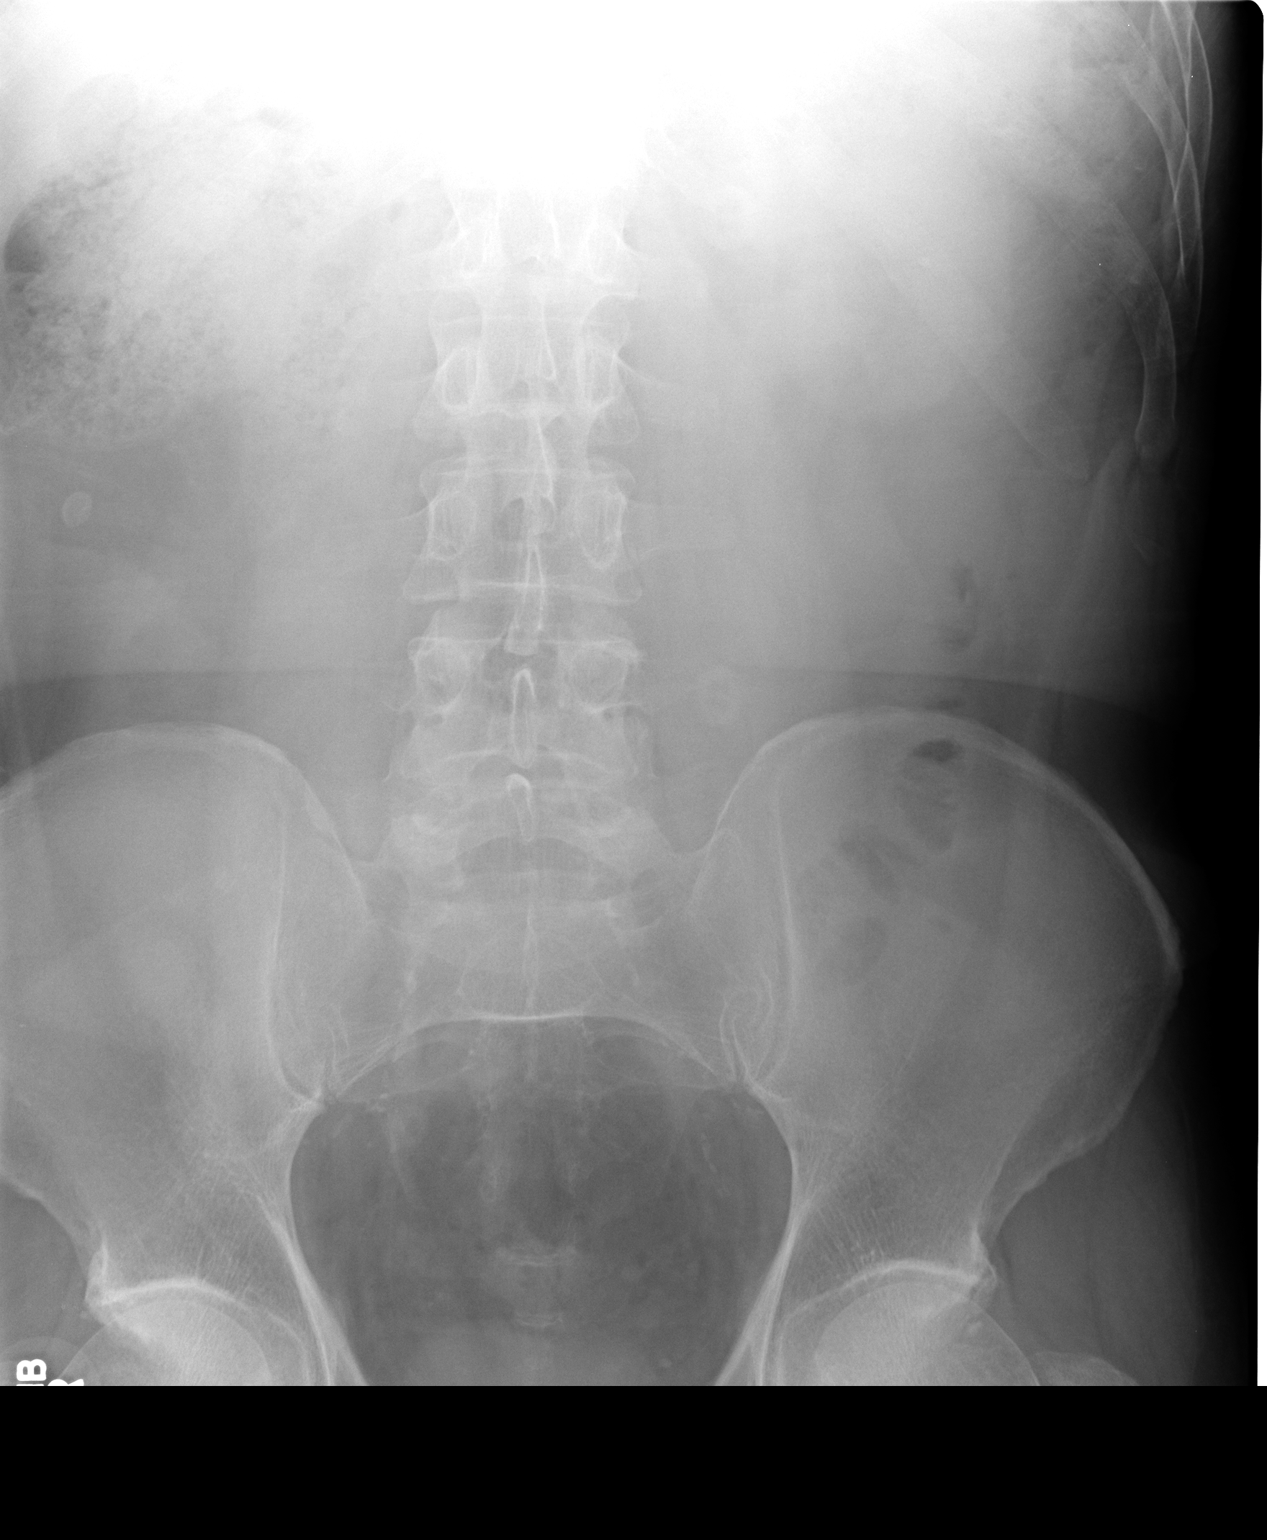

[view not recorded (2 of 2)]
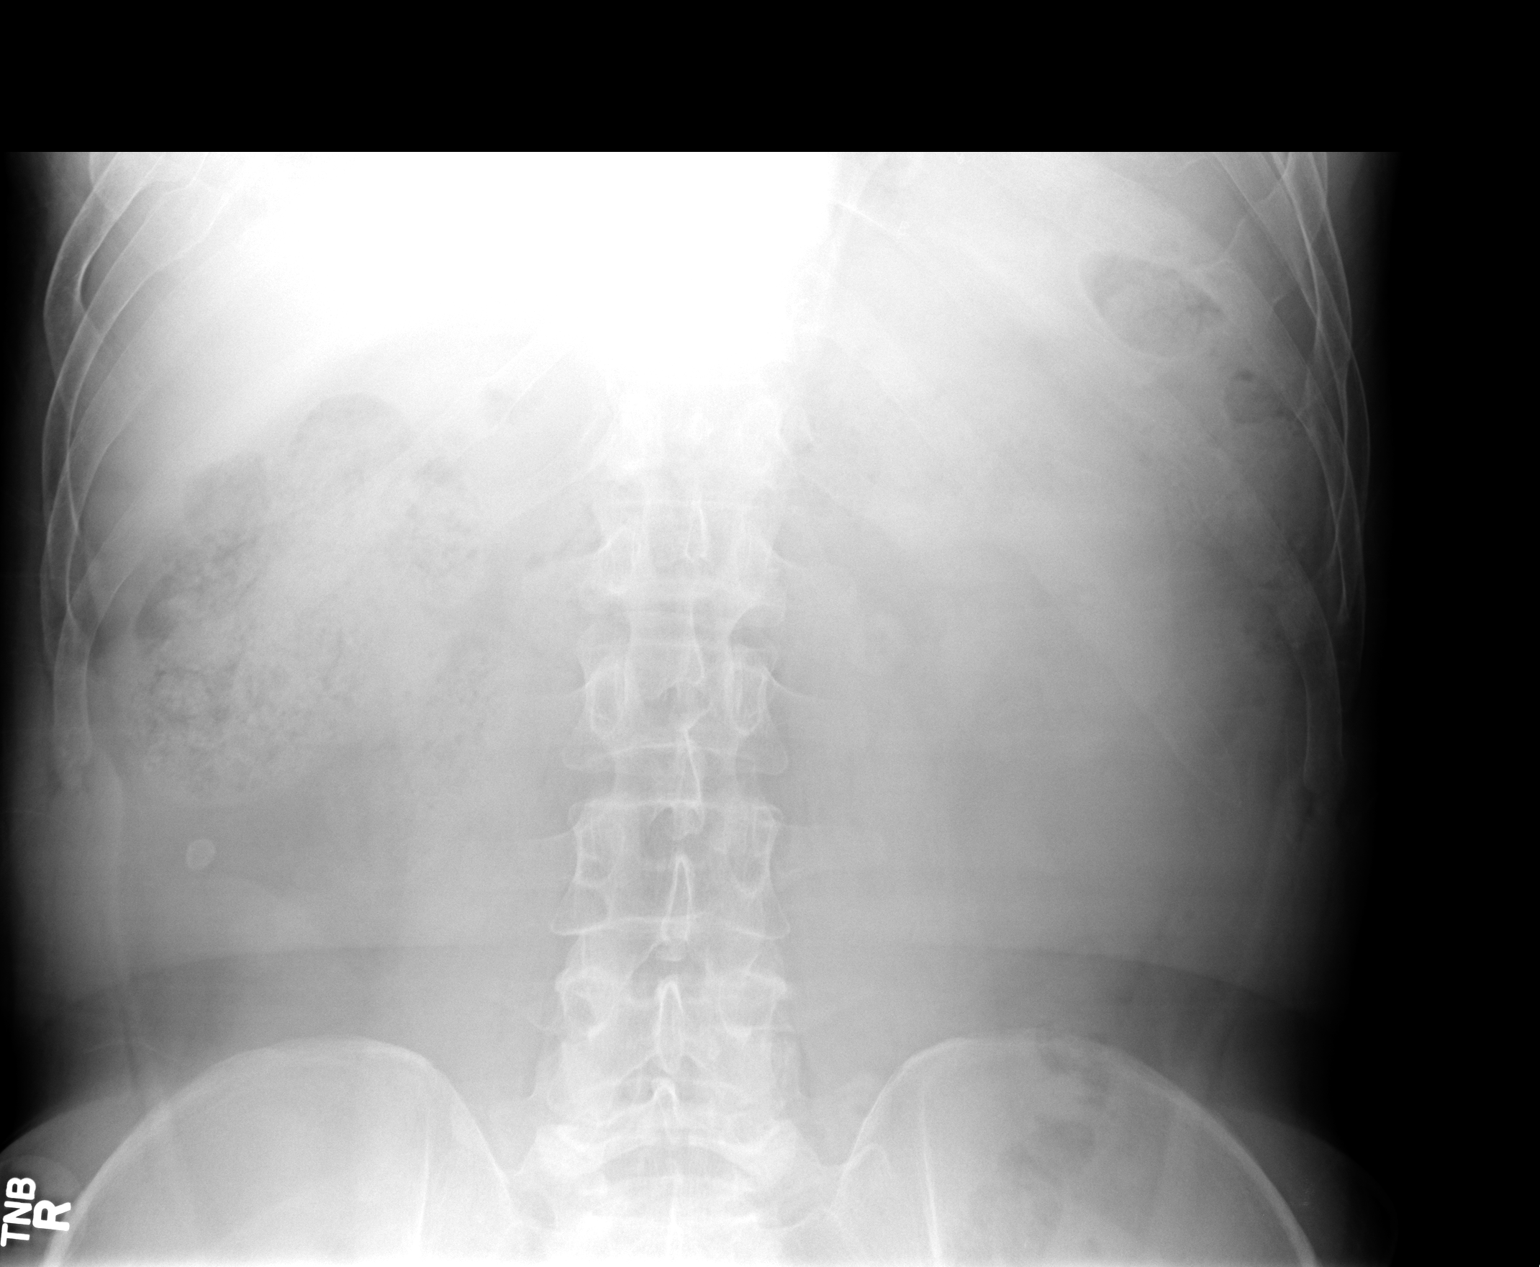

[2 of 2 positions shown; findings below may reference images not displayed]

FINDINGS: There are no visible stones in the right kidney or along
the course of the right ureter.  Vascular calcifications are
present in the pelvis.  Tiny stone in the lower pole of the left
kidney is unchanged.

Bowel gas pattern is normal.  11 mm calcification in the right mid
abdomen has been previously demonstrated to be adjacent to the
bowel, probably a calcified epiploic appendage of no significance.
IMPRESSION: 1.  No visible right renal or ureteral calculi.
2.  Stable tiny stone in the lower pole of the left kidney.

## 2011-08-02 DIAGNOSIS — D3131 Benign neoplasm of right choroid: Secondary | ICD-10-CM | POA: Insufficient documentation

## 2011-09-06 DIAGNOSIS — H029 Unspecified disorder of eyelid: Secondary | ICD-10-CM | POA: Insufficient documentation

## 2011-12-17 DIAGNOSIS — D509 Iron deficiency anemia, unspecified: Secondary | ICD-10-CM | POA: Insufficient documentation

## 2013-04-29 DIAGNOSIS — K2270 Barrett's esophagus without dysplasia: Secondary | ICD-10-CM | POA: Insufficient documentation

## 2013-12-21 DIAGNOSIS — R7302 Impaired glucose tolerance (oral): Secondary | ICD-10-CM | POA: Insufficient documentation

## 2017-07-19 ENCOUNTER — Encounter (HOSPITAL_COMMUNITY): Payer: Self-pay

## 2017-07-19 ENCOUNTER — Other Ambulatory Visit: Payer: Self-pay

## 2017-07-19 ENCOUNTER — Emergency Department (HOSPITAL_COMMUNITY): Payer: BLUE CROSS/BLUE SHIELD

## 2017-07-19 ENCOUNTER — Inpatient Hospital Stay (HOSPITAL_COMMUNITY)
Admission: EM | Admit: 2017-07-19 | Discharge: 2017-07-21 | DRG: 177 | Disposition: A | Discharge status: Home / Self Care | Payer: BLUE CROSS/BLUE SHIELD | Attending: Internal Medicine | Admitting: Internal Medicine

## 2017-07-19 DIAGNOSIS — J449 Chronic obstructive pulmonary disease, unspecified: Secondary | ICD-10-CM | POA: Diagnosis present

## 2017-07-19 DIAGNOSIS — R000 Tachycardia, unspecified: Secondary | ICD-10-CM | POA: Diagnosis present

## 2017-07-19 DIAGNOSIS — J9811 Atelectasis: Secondary | ICD-10-CM | POA: Diagnosis present

## 2017-07-19 DIAGNOSIS — J690 Pneumonitis due to inhalation of food and vomit: Secondary | ICD-10-CM | POA: Diagnosis present

## 2017-07-19 DIAGNOSIS — K210 Gastro-esophageal reflux disease with esophagitis: Secondary | ICD-10-CM | POA: Diagnosis present

## 2017-07-19 DIAGNOSIS — I10 Essential (primary) hypertension: Secondary | ICD-10-CM | POA: Diagnosis present

## 2017-07-19 DIAGNOSIS — Z881 Allergy status to other antibiotic agents status: Secondary | ICD-10-CM | POA: Diagnosis not present

## 2017-07-19 DIAGNOSIS — E785 Hyperlipidemia, unspecified: Secondary | ICD-10-CM | POA: Diagnosis present

## 2017-07-19 DIAGNOSIS — R55 Syncope and collapse: Secondary | ICD-10-CM | POA: Diagnosis present

## 2017-07-19 DIAGNOSIS — K22711 Barrett's esophagus with high grade dysplasia: Secondary | ICD-10-CM | POA: Diagnosis present

## 2017-07-19 DIAGNOSIS — Z8501 Personal history of malignant neoplasm of esophagus: Secondary | ICD-10-CM | POA: Diagnosis not present

## 2017-07-19 DIAGNOSIS — J9601 Acute respiratory failure with hypoxia: Secondary | ICD-10-CM | POA: Diagnosis present

## 2017-07-19 DIAGNOSIS — Z79899 Other long term (current) drug therapy: Secondary | ICD-10-CM

## 2017-07-19 DIAGNOSIS — Z87891 Personal history of nicotine dependence: Secondary | ICD-10-CM | POA: Diagnosis not present

## 2017-07-19 DIAGNOSIS — R0600 Dyspnea, unspecified: Secondary | ICD-10-CM

## 2017-07-19 DIAGNOSIS — M810 Age-related osteoporosis without current pathological fracture: Secondary | ICD-10-CM | POA: Diagnosis present

## 2017-07-19 HISTORY — DX: Gastro-esophageal reflux disease without esophagitis: K21.9

## 2017-07-19 HISTORY — DX: Essential (primary) hypertension: I10

## 2017-07-19 HISTORY — DX: Chronic obstructive pulmonary disease, unspecified: J44.9

## 2017-07-19 HISTORY — DX: Malignant (primary) neoplasm, unspecified: C80.1

## 2017-07-19 HISTORY — DX: Anemia, unspecified: D64.9

## 2017-07-19 HISTORY — DX: Age-related osteoporosis without current pathological fracture: M81.0

## 2017-07-19 HISTORY — DX: Hyperlipidemia, unspecified: E78.5

## 2017-07-19 HISTORY — DX: Pneumonitis due to inhalation of food and vomit: J69.0

## 2017-07-19 LAB — CBC WITH DIFFERENTIAL/PLATELET
BASOS ABS: 0 10*3/uL (ref 0.0–0.1)
BASOS PCT: 0 %
Eosinophils Absolute: 0 10*3/uL (ref 0.0–0.7)
Eosinophils Relative: 0 %
HEMATOCRIT: 43.3 % (ref 39.0–52.0)
HEMOGLOBIN: 14.3 g/dL (ref 13.0–17.0)
LYMPHS PCT: 4 %
Lymphs Abs: 0.7 10*3/uL (ref 0.7–4.0)
MCH: 29.9 pg (ref 26.0–34.0)
MCHC: 33 g/dL (ref 30.0–36.0)
MCV: 90.4 fL (ref 78.0–100.0)
Monocytes Absolute: 0.9 10*3/uL (ref 0.1–1.0)
Monocytes Relative: 4 %
NEUTROS ABS: 17.8 10*3/uL — AB (ref 1.7–7.7)
Neutrophils Relative %: 92 %
Platelets: 189 10*3/uL (ref 150–400)
RBC: 4.79 MIL/uL (ref 4.22–5.81)
RDW: 15.1 % (ref 11.5–15.5)
WBC: 19.4 10*3/uL — AB (ref 4.0–10.5)

## 2017-07-19 LAB — COMPREHENSIVE METABOLIC PANEL
ALBUMIN: 3.9 g/dL (ref 3.5–5.0)
ALK PHOS: 42 U/L (ref 38–126)
ALT: 25 U/L (ref 0–44)
ANION GAP: 8 (ref 5–15)
AST: 27 U/L (ref 15–41)
BUN: 14 mg/dL (ref 8–23)
CALCIUM: 8.7 mg/dL — AB (ref 8.9–10.3)
CO2: 26 mmol/L (ref 22–32)
Chloride: 107 mmol/L (ref 98–111)
Creatinine, Ser: 1.22 mg/dL (ref 0.61–1.24)
GLUCOSE: 155 mg/dL — AB (ref 70–99)
POTASSIUM: 4.3 mmol/L (ref 3.5–5.1)
Sodium: 141 mmol/L (ref 135–145)
TOTAL PROTEIN: 7.4 g/dL (ref 6.5–8.1)
Total Bilirubin: 0.6 mg/dL (ref 0.3–1.2)

## 2017-07-19 LAB — BLOOD GAS, VENOUS
ACID-BASE EXCESS: 2.1 mmol/L — AB (ref 0.0–2.0)
BICARBONATE: 24 mmol/L (ref 20.0–28.0)
FIO2: 21
O2 Saturation: 32.5 %
PATIENT TEMPERATURE: 37.3
PCO2 VEN: 48 mmHg (ref 44.0–60.0)
pH, Ven: 7.36 (ref 7.250–7.430)

## 2017-07-19 LAB — TROPONIN I: Troponin I: <0.03 ng/mL (ref <0.03)

## 2017-07-19 LAB — PROCALCITONIN: Procalcitonin: 0.22 ng/mL

## 2017-07-19 LAB — D-DIMER, QUANTITATIVE (NOT AT ARMC): D DIMER QUANT: 0.9 ug{FEU}/mL — AB (ref 0.00–0.50)

## 2017-07-19 IMAGING — CT CT CHEST W/O CM
2 of 3 series · 15 of 36 positions shown, 18 images · non-contrast
Comparison: Chest x-ray on [DATE]

CLINICAL DATA: Per ED note.Pt woke up with acid obstructing airway.
Pt became weak, dizzy and nauseated due to trying to clear airway.
Reported by wife that he had 2 syncopal episodes. Pt reports became
pale and diaphoretic. History of esophageal cancer with
esophagectomy. History of COPD.

EXAM:
CT CHEST WITHOUT CONTRAST
TECHNIQUE: Multidetector CT imaging of the chest was performed following the
standard protocol without IV contrast.

[Series 2: thorax · axial · 0.70mm/px · z∈[+1287,+1543]mm · 12 of 152 slices shown, 15 images]
[im 12/152  mediastinal]
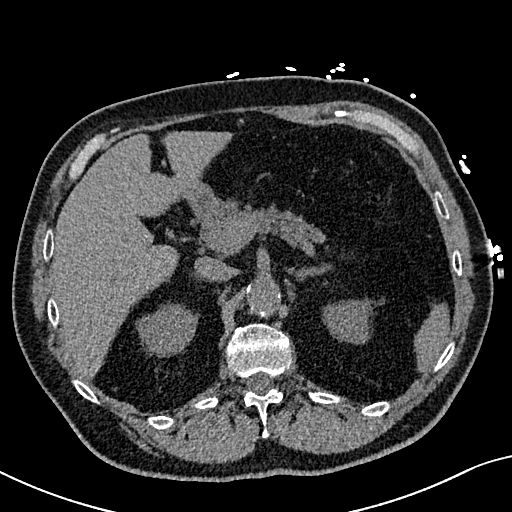
[im 12/152  lung]
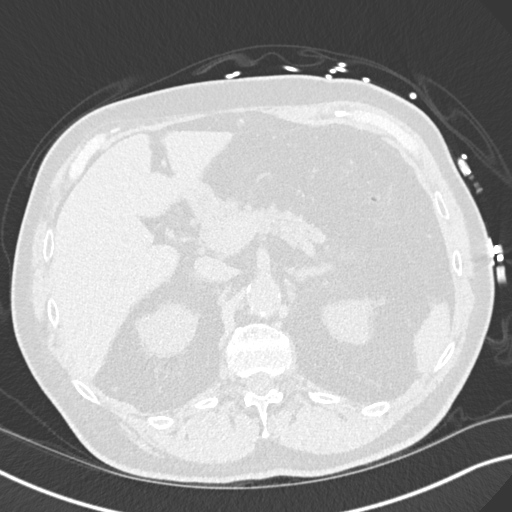
[im 23/152  lung]
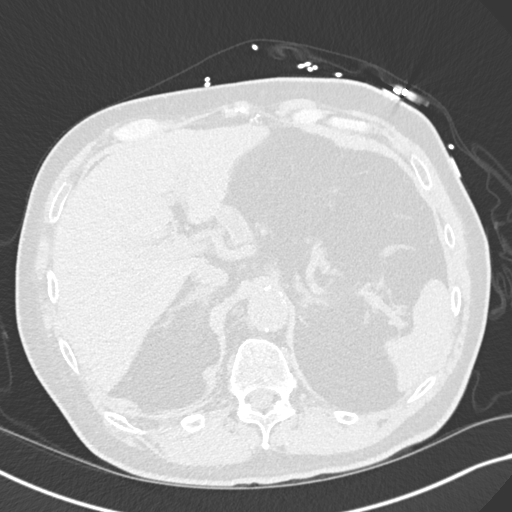
[im 34/152  lung]
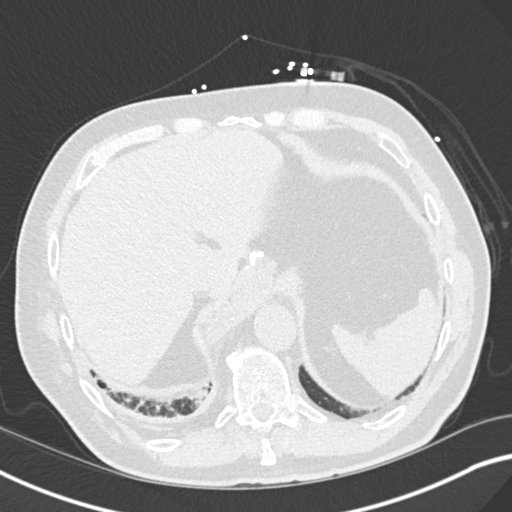
[im 45/152  lung]
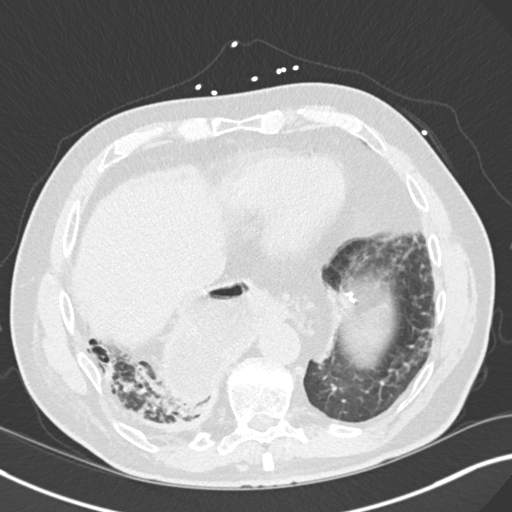
[im 56/152  mediastinal]
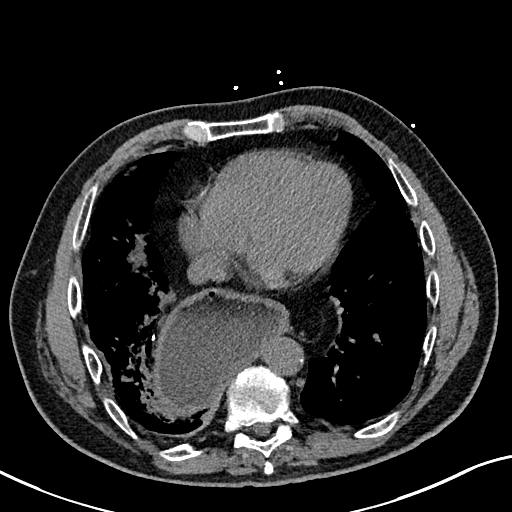
[im 56/152  lung]
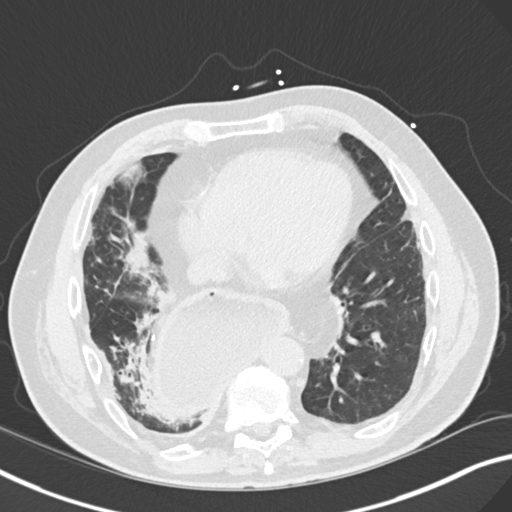
[im 68/152  lung]
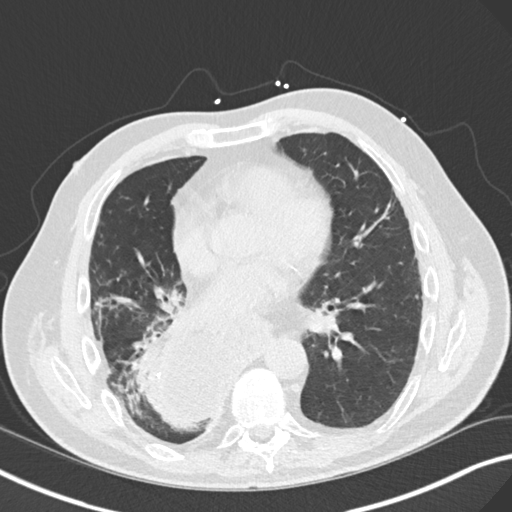
[im 84/152  lung]
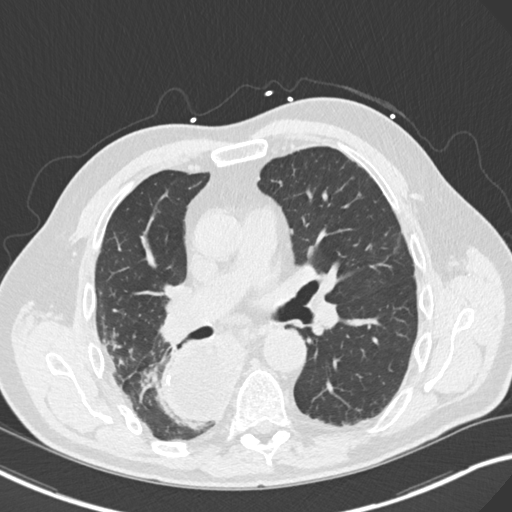
[im 96/152  lung]
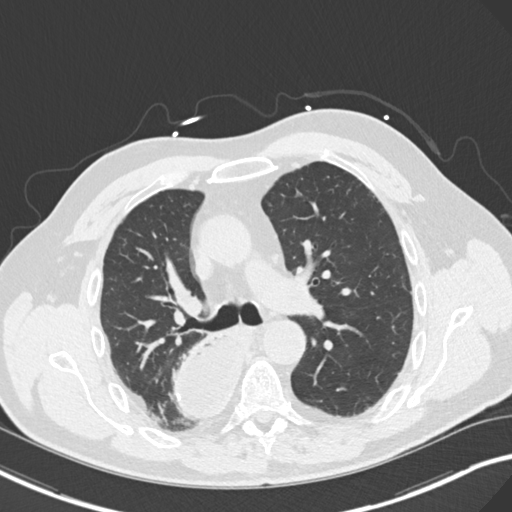
[im 107/152  mediastinal]
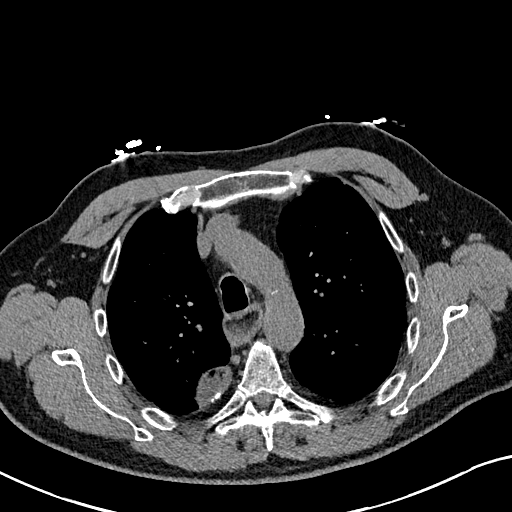
[im 107/152  lung]
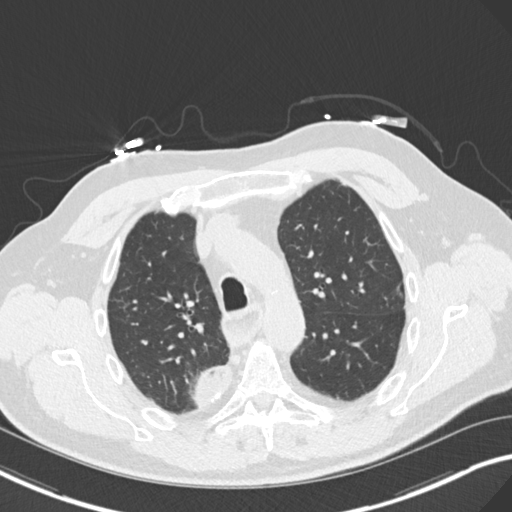
[im 118/152  lung]
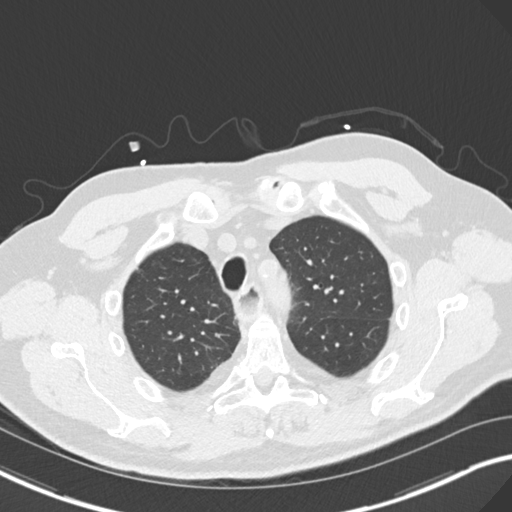
[im 129/152  lung]
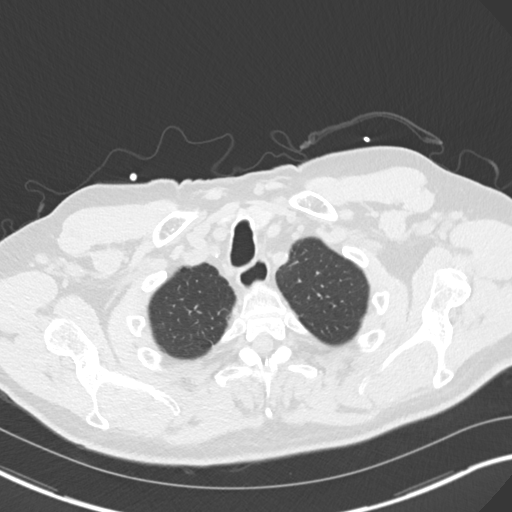
[im 140/152  lung]
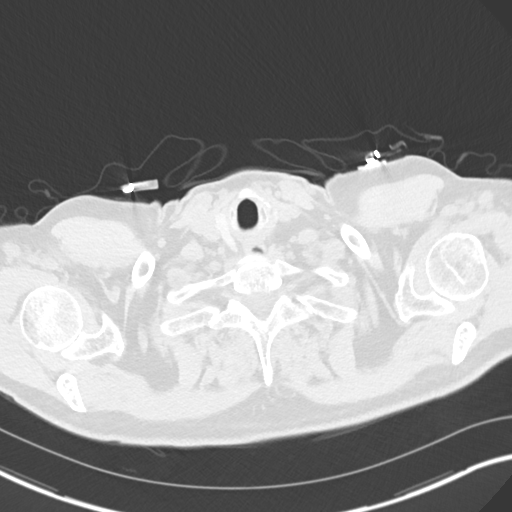

[Series 5: coronal · coronal · 0.59mm/px · 3 of 131 slices shown]
[im 27/131  lung]
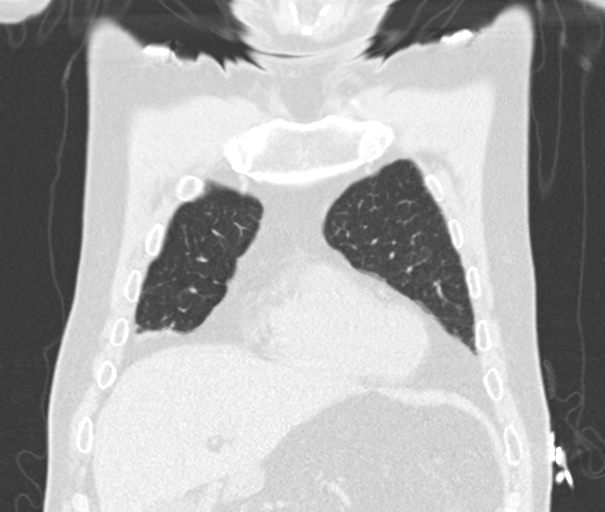
[im 53/131  lung]
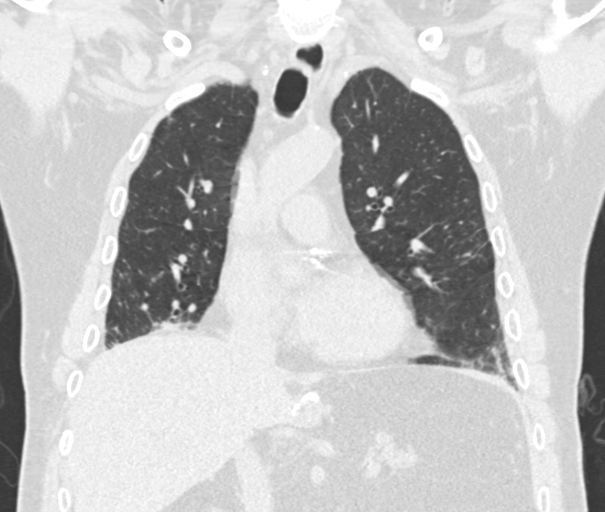
[im 79/131  lung]
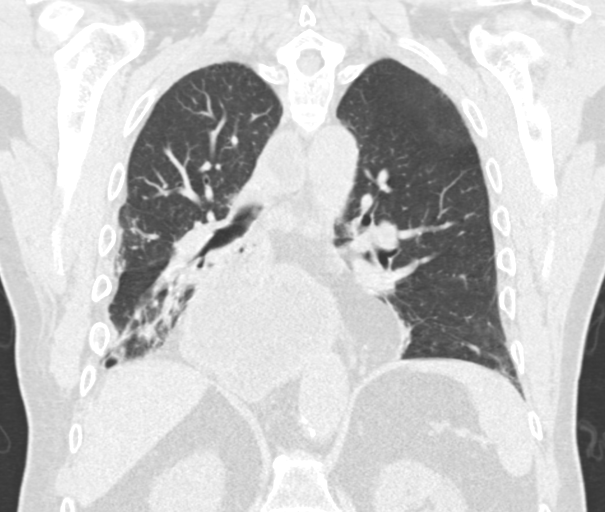

[15 of 36 positions shown; findings below may reference images not displayed]

FINDINGS: Cardiovascular: The heart size is normal. There is atherosclerotic
calcification of the coronary vessels. No pericardial effusion.
There is minimal atherosclerotic calcification of the thoracic aorta
not associated with aneurysm. Normal noncontrast appearance of the
pulmonary arteries.

Mediastinum/Nodes: Status post esophagectomy and gastric
pull-through. Expected air-fluid level identified within the
thoracic stomach. The visualized portion of the thyroid gland has a
normal appearance. No mediastinal, hilar, or axillary adenopathy.

Lungs/Pleura: Airways are patent. There is atelectasis in the RIGHT
LOWER lobe adjacent to the gastric pull-through. No suspicious
pulmonary nodules, consolidations, or pleural effusions.

Upper Abdomen: Nonobstructing calculi in the UPPER pole region of
the RIGHT kidney measure up to 4 millimeters in diameter. There is
atherosclerotic calcification of the abdominal aorta.

Musculoskeletal: Kyphotic curvature of the thoracic spine. Numerous
wedge compression fractures, of indeterminate age. With
IMPRESSION: 1. No acute abnormality.
2. Coronary artery disease.
3.  Aortic atherosclerosis.  ([9X]-[9X])
4. Esophagectomy and gastric pull-through, unremarkable in
appearance.
5. No suspicious pulmonary abnormality. RIGHT LOWER lobe
atelectasis.
6. Numerous thoracic wedge compression fractures.

## 2017-07-19 MED ORDER — SENNOSIDES-DOCUSATE SODIUM 8.6-50 MG PO TABS
1.0000 | ORAL_TABLET | Freq: Every evening | ORAL | Status: DC | PRN
Start: 2017-07-19 — End: 2017-07-21

## 2017-07-19 MED ORDER — ACETAMINOPHEN 325 MG PO TABS
650.0000 mg | ORAL_TABLET | Freq: Four times a day (QID) | ORAL | Status: DC | PRN
  Administered 2017-07-20: 650 mg via ORAL
  Filled 2017-07-19: qty 2

## 2017-07-19 MED ORDER — SODIUM CHLORIDE 0.9% FLUSH
3.0000 mL | Freq: Two times a day (BID) | INTRAVENOUS | Status: DC
  Administered 2017-07-19 – 2017-07-20 (×3): 3 mL via INTRAVENOUS

## 2017-07-19 MED ORDER — SODIUM CHLORIDE 0.9 % IV SOLN
INTRAVENOUS | Status: AC
  Administered 2017-07-19: 23:00:00 via INTRAVENOUS

## 2017-07-19 MED ORDER — ACETAMINOPHEN 650 MG RE SUPP: 650.0000 mg | Freq: Four times a day (QID) | RECTAL | Status: DC | PRN

## 2017-07-19 MED ORDER — ALBUTEROL SULFATE (2.5 MG/3ML) 0.083% IN NEBU: 2.5000 mg | INHALATION_SOLUTION | RESPIRATORY_TRACT | Status: DC | PRN

## 2017-07-19 MED ORDER — IRBESARTAN 75 MG PO TABS
75.0000 mg | ORAL_TABLET | Freq: Every day | ORAL | Status: DC
  Administered 2017-07-20 – 2017-07-21 (×2): 75 mg via ORAL
  Filled 2017-07-19 (×2): qty 1

## 2017-07-19 MED ORDER — FAMOTIDINE IN NACL 20-0.9 MG/50ML-% IV SOLN
20.0000 mg | Freq: Two times a day (BID) | INTRAVENOUS | Status: DC
  Administered 2017-07-20 (×2): 20 mg via INTRAVENOUS
  Filled 2017-07-19 (×2): qty 50

## 2017-07-19 MED ORDER — DICLOFENAC SODIUM 1 % TD GEL
4.0000 g | Freq: Four times a day (QID) | TRANSDERMAL | Status: DC | PRN
  Filled 2017-07-19 (×2): qty 100

## 2017-07-19 MED ORDER — SODIUM CHLORIDE 0.9 % IV BOLUS
500.0000 mL | Freq: Once | INTRAVENOUS | Status: AC
  Administered 2017-07-19: 500 mL via INTRAVENOUS

## 2017-07-19 MED ORDER — ONDANSETRON HCL 4 MG PO TABS: 4.0000 mg | ORAL_TABLET | Freq: Four times a day (QID) | ORAL | Status: DC | PRN

## 2017-07-19 MED ORDER — ENOXAPARIN SODIUM 40 MG/0.4ML ~~LOC~~ SOLN
40.0000 mg | SUBCUTANEOUS | Status: DC
  Administered 2017-07-20 (×2): 40 mg via SUBCUTANEOUS
  Filled 2017-07-19 (×2): qty 0.4

## 2017-07-19 MED ORDER — HYDROCODONE-ACETAMINOPHEN 5-325 MG PO TABS: 1.0000 | ORAL_TABLET | ORAL | Status: DC | PRN

## 2017-07-19 MED ORDER — CLINDAMYCIN PHOSPHATE 600 MG/50ML IV SOLN
600.0000 mg | Freq: Once | INTRAVENOUS | Status: AC
  Administered 2017-07-19: 600 mg via INTRAVENOUS
  Filled 2017-07-19: qty 50

## 2017-07-19 MED ORDER — VITAMIN B COMPLEX PO TABS: 1.0000 | ORAL_TABLET | Freq: Two times a day (BID) | ORAL | Status: DC

## 2017-07-19 MED ORDER — ONDANSETRON HCL 4 MG/2ML IJ SOLN: 4.0000 mg | Freq: Four times a day (QID) | INTRAMUSCULAR | Status: DC | PRN

## 2017-07-19 MED ORDER — SODIUM CHLORIDE 0.9 % IV SOLN
3.0000 g | Freq: Four times a day (QID) | INTRAVENOUS | Status: DC
  Administered 2017-07-19 – 2017-07-21 (×6): 3 g via INTRAVENOUS
  Filled 2017-07-19 (×12): qty 3

## 2017-07-19 MED ORDER — B COMPLEX-C PO TABS
1.0000 | ORAL_TABLET | Freq: Two times a day (BID) | ORAL | Status: DC
  Administered 2017-07-20 – 2017-07-21 (×3): 1 via ORAL
  Filled 2017-07-19 (×3): qty 1

## 2017-07-19 MED ORDER — NON FORMULARY
40.0000 mg | Freq: Once | Status: DC
  Administered 2017-07-19: 40 mg via ORAL

## 2017-07-19 NOTE — ED Notes (Signed)
Hospitalist in room.

## 2017-07-19 NOTE — ED Notes (Signed)
Dr Myna Hidalgo ok'd for pt to take home Nexium as well as able to eat

## 2017-07-19 NOTE — ED Notes (Signed)
O2 initiating at 3 L/min via [place]

## 2017-07-19 NOTE — ED Triage Notes (Addendum)
Pt woke up with acid obstructing airway. Pt became weak, dizzy and nauseated due to trying to clear airway. Reported by wife that he had 2 syncopal episodes. Pt reports became pale and diaphoretic. Pt taken to [place] and worked up. Pt experienced 2nd syncopal episode while transferring into care at [facility]. Lasting approx 15-20 secs. Pt wanted to be transferred to this hosp. Pt has hx of esophageal cancer with partial esophageal ejectomy. Pt given approx 800 cc of fluid by EMS

## 2017-07-19 NOTE — ED Notes (Signed)
CRITICAL VALUE ALERT  Critical Value:  PO2 not registering  Date & Time Notied: 07/19/17 1900  Provider Notified: Dr Eulis Foster  Orders Received/Actions taken: No new orders

## 2017-07-19 NOTE — ED Provider Notes (Signed)
[facility] EMERGENCY DEPARTMENT Provider Note   CSN: [id number] Arrival date & time: 07/19/17  1649     History   Chief Complaint Chief Complaint  Patient presents with  . Loss of Consciousness    HPI James Wolf is a 67 y.o. male.  HPI   Patient is here for evaluation of shortness of breath with "wheezing," and 2 episodes of syncope which occurred after "aspirating stomach contents."  Patient was sitting in his recliner around 9:30 AM when he had reflux of gastric contents, and began to choke on them.  Subsequent to that he had trouble breathing with wheezing.  After that he was resting, and his wife was with him when she saw him pass out for 15 to 20 seconds.  She tried to arouse him but he did not speak became able to talk around 30 seconds after passing out.  Patient reported to his wife that he was able to hear her but could not speak while he was "passed out."  He had a second episode of syncope while he is being evaluated in urgent care.  At that point the provider at the urgent care 1 to transfer him to a skilled facility in [place], [place].  Patient and his wife chose to come to this facility, because they were more comfortable with evaluation here.  He presents by EMS for evaluation.  Patient states he feels drained and washed out.  He denies headache, chest pain, back pain, dizziness, nausea or vomiting.  Patient has frequent episodes of reflux of fluid from his stomach, almost daily.  He apparently does self esophageal dilation several times a week.  He apparently had a partial esophageal removal with connection of his stomach in his mid chest by his report.  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  Past Medical History:  Diagnosis Date  . Anemia   . Aspiration pneumonia ([place])   . Cancer ([place])    esophageal cancer 1995  . COPD (chronic obstructive pulmonary disease) ([place])   . GERD (gastroesophageal reflux disease)    . Hyperlipidemia   . Hypertension   . Osteoporosis     Patient Active Problem List   Diagnosis Date Noted  . Hypertension 07/19/2017  . COPD (chronic obstructive pulmonary disease) ([place]) 07/19/2017  . History of esophageal cancer 07/19/2017  . Acute respiratory failure with hypoxia ([place]) 07/19/2017  . Aspiration pneumonitis ([place]) 07/19/2017  . Syncope 07/19/2017    Past Surgical History:  Procedure Laterality Date  . partial esophageal ejectory          Home Medications    Prior to Admission medications   Medication Sig Start Date End Date Taking? Authorizing Provider  B Complex Vitamins (VITAMIN B COMPLEX PO) Take 1 tablet by mouth 2 (two) times daily. 03/10/07  Yes [provider]  diclofenac sodium (VOLTAREN) 1 % GEL 4 GRAM ON SKIN 4 TIMES A DAY AS NEEDED 09/24/15  Yes [provider]  esomeprazole (NEXIUM) 40 MG capsule Take 1 capsule by mouth 2 (two) times daily. 05/26/17  Yes [provider]  telmisartan (MICARDIS) 20 MG tablet Take 1 tablet by mouth daily. 06/30/17  Yes [provider]  Vitamin D, Ergocalciferol, (DRISDOL) 50000 units CAPS capsule Take by mouth.   Yes [provider]    Family History History reviewed. No pertinent family history.  Social History Social History   Tobacco Use  . Smoking status: Former Smoker  Last attempt to quit: 02/19/1993    Years since quitting: 24.4  Substance Use Topics  . Alcohol use: Not Currently  . Drug use: Never     Allergies   Zithromax [azithromycin]   Review of Systems Review of Systems  All other systems reviewed and are negative.    Physical Exam Updated Vital Signs BP 134/87   Pulse (!) 117   Temp 99.2 F (37.3 C) (Oral)   Resp (!) 30   Ht 5\' 5"  (1.651 m)   Wt 77.1 kg (170 lb)   SpO2 96%   BMI 28.29 kg/m   Physical Exam  Constitutional: He is oriented to person, place, and time. He appears well-developed and well-nourished. No distress.    HENT:  Head: Normocephalic and atraumatic.  Right Ear: External ear normal.  Left Ear: External ear normal.  Eyes: Pupils are equal, round, and reactive to light. Conjunctivae and EOM are normal.  Neck: Normal range of motion and phonation normal. Neck supple.  Cardiovascular: Normal rate, regular rhythm and normal heart sounds.  Pulmonary/Chest: Effort normal. No respiratory distress. He exhibits no bony tenderness.  Decreased breath sounds right mid and lower lung.  There is no increased work of breathing.  There are no generalized wheezes.  Abdominal: Soft. There is no tenderness.  Musculoskeletal: Normal range of motion.  Neurological: He is alert and oriented to person, place, and time. No cranial nerve deficit or sensory deficit. He exhibits normal muscle tone. Coordination normal.  Skin: Skin is warm, dry and intact.  Psychiatric: He has a normal mood and affect. His behavior is normal. Judgment and thought content normal.  Nursing note and vitals reviewed.    ED Treatments / Results  Labs (all labs ordered are listed, but only abnormal results are displayed) Labs Reviewed  BLOOD GAS, VENOUS - Abnormal; Notable for the following components:      Result Value   Acid-Base Excess 2.1 (*)    All other components within normal limits  COMPREHENSIVE METABOLIC PANEL - Abnormal; Notable for the following components:   Glucose, Bld 155 (*)    Calcium 8.7 (*)    All other components within normal limits  CBC WITH DIFFERENTIAL/PLATELET - Abnormal; Notable for the following components:   WBC 19.4 (*)    Neutro Abs 17.8 (*)    All other components within normal limits  TROPONIN I    EKG EKG Interpretation  Date/Time:  Saturday July 19 2017 17:37:39 EDT Ventricular Rate:  112 PR Interval:    QRS Duration: 101 QT Interval:  326 QTC Calculation: 445 R Axis:   8 Text Interpretation:  Sinus tachycardia Abnormal R-wave progression, early transition Nonspecific repol abnormality,  inferior leads No old tracing to compare Confirmed by Daleen Bo [phone]) on 07/19/2017 5:56:46 PM   Radiology Ct Chest Wo Contrast  Result Date: 07/19/2017 CLINICAL DATA:  Per ED note.Pt woke up with acid obstructing airway. Pt became weak, dizzy and nauseated due to trying to clear airway. Reported by wife that he had 2 syncopal episodes. Pt reports became pale and diaphoretic. History of esophageal cancer with esophagectomy. History of COPD. EXAM: CT CHEST WITHOUT CONTRAST TECHNIQUE: Multidetector CT imaging of the chest was performed following the standard protocol without IV contrast. COMPARISON:  Chest x-ray on 03/21/2009 FINDINGS: Cardiovascular: The heart size is normal. There is atherosclerotic calcification of the coronary vessels. No pericardial effusion. There is minimal atherosclerotic calcification of the thoracic aorta not associated with aneurysm. Normal noncontrast appearance of the  pulmonary arteries. Mediastinum/Nodes: Status post esophagectomy and gastric pull-through. Expected air-fluid level identified within the thoracic stomach. The visualized portion of the thyroid gland has a normal appearance. No mediastinal, hilar, or axillary adenopathy. Lungs/Pleura: Airways are patent. There is atelectasis in the RIGHT LOWER lobe adjacent to the gastric pull-through. No suspicious pulmonary nodules, consolidations, or pleural effusions. Upper Abdomen: Nonobstructing calculi in the UPPER pole region of the RIGHT kidney measure up to 4 millimeters in diameter. There is atherosclerotic calcification of the abdominal aorta. Musculoskeletal: Kyphotic curvature of the thoracic spine. Numerous wedge compression fractures, of indeterminate age. With IMPRESSION: 1. No acute abnormality. 2. Coronary artery disease. 3.  Aortic atherosclerosis.  (ICD10-I70.0) 4. Esophagectomy and gastric pull-through, unremarkable in appearance. 5. No suspicious pulmonary abnormality. RIGHT LOWER lobe atelectasis. 6.  Numerous thoracic wedge compression fractures. Electronically Signed   By: Nolon Nations M.D.   On: 07/19/2017 18:33    Procedures .Critical Care Performed by: Daleen Bo, MD Authorized by: Daleen Bo, MD   Critical care provider statement:    Critical care time (minutes):  40   Critical care start time:  07/19/2017 5:00 PM   Critical care end time:  07/19/2017 8:37 PM   Critical care time was exclusive of:  Separately billable procedures and treating other patients   Critical care was necessary to treat or prevent imminent or life-threatening deterioration of the following conditions:  Respiratory failure   Critical care was time spent personally by me on the following activities:  Blood draw for specimens, development of treatment plan with patient or surrogate, discussions with consultants, evaluation of patient's response to treatment, examination of patient, obtaining history from patient or surrogate, ordering and performing treatments and interventions, ordering and review of laboratory studies, pulse oximetry, re-evaluation of patient's condition, review of old charts and ordering and review of radiographic studies   (including critical care time)  Medications Ordered in ED Medications  sodium chloride 0.9 % bolus 500 mL (500 mLs Intravenous New Bag/Given 07/19/17 2044)  clindamycin (CLEOCIN) IVPB 600 mg (600 mg Intravenous New Bag/Given 07/19/17 2044)  Vitamin B Complex TABS 1 tablet (has no administration in time range)  diclofenac sodium (VOLTAREN) 1 % transdermal gel 4 g (has no administration in time range)  irbesartan (AVAPRO) tablet 75 mg (has no administration in time range)  sodium chloride flush (NS) 0.9 % injection 3 mL (has no administration in time range)  enoxaparin (LOVENOX) injection 40 mg (has no administration in time range)  0.9 %  sodium chloride infusion (has no administration in time range)  acetaminophen (TYLENOL) tablet 650 mg (has no administration  in time range)    Or  acetaminophen (TYLENOL) suppository 650 mg (has no administration in time range)  HYDROcodone-acetaminophen (NORCO/VICODIN) 5-325 MG per tablet 1-2 tablet (has no administration in time range)  senna-docusate (Senokot-S) tablet 1 tablet (has no administration in time range)  ondansetron (ZOFRAN) tablet 4 mg (has no administration in time range)    Or  ondansetron (ZOFRAN) injection 4 mg (has no administration in time range)  albuterol (PROVENTIL) (2.5 MG/3ML) 0.083% nebulizer solution 2.5 mg (has no administration in time range)  sodium chloride 0.9 % bolus 500 mL (0 mLs Intravenous Stopped 07/19/17 1833)  NON FORMULARY 40 mg (40 mg Oral Given 07/19/17 2052)     Initial Impression / Assessment and Plan / ED Course  I have reviewed the triage vital signs and the nursing notes.  Pertinent labs & imaging results that were available during my  care of the patient were reviewed by me and considered in my medical decision making (see chart for details).  Clinical Course as of Jul 20 2102  Sat Jul 19, 2017  1949 Normal except oxygen value is invalid.  Blood gas, venous(!) [EW]  1954 Normal except white count elevated  CBC with Differential(!) [EW]  1955 Normal  Troponin I [EW]  1956 Normal except glucose high 155, calcium low 8.7  Comprehensive metabolic panel(!) [EW]  1700 No abnormal fluid collection, no pneumonia, images reviewed by me  CT CHEST WO CONTRAST [EW]    Clinical Course User Index [EW] Daleen Bo, MD     Patient Vitals for the past 24 hrs:  BP Temp Temp src Pulse Resp SpO2 Height Weight  07/19/17 2025 - 99.2 F (37.3 C) Oral - - - - -  07/19/17 2020 - - - - - 96 % - -  07/19/17 1944 134/87 - - (!) 117 - 97 % - -  07/19/17 1900 - - - (!) 111 - 96 % - -  07/19/17 1834 - 99.1 F (37.3 C) Oral - - - - -  07/19/17 1830 (!) 132/91 - - (!) 117 (!) 30 93 % - -  07/19/17 1800 129/88 - - (!) 118 20 93 % - -  07/19/17 1730 122/89 - - (!) 115 (!) 24  91 % - -  07/19/17 1709 - 99.3 F (37.4 C) Oral - - - - -  07/19/17 1700 (!) 120/91 - - (!) 106 18 94 % - -  07/19/17 1657 - - - - - - 5\' 5"  (1.651 m) 77.1 kg (170 lb)  07/19/17 1656 136/80 - Oral (!) 108 (!) 28 92 % - -    8:11 PM Reevaluation with update and discussion. After initial assessment and treatment, an updated evaluation reveals oxygen saturation nadir was 91%.  He was placed on oxygen per nasal cannula with improvement of the saturation to 97%.  He continues to be tachypneic and tachycardic.  Lung exam continues to have decreased air movement in right lower lung.  Findings discussed with patient and family member. Daleen Bo   Medical Decision Making: Patient with clinical aspiration, by report, with abnormal lung exam and persistent tachypnea and tachycardia.  CT imaging does not indicate pneumonitis, abnormal appearance of lung, or show signs of early pneumonia.  Patient with borderline low oxygen saturation.  Saturation normalizes with nasal cannula oxygen.  Patient with chronic, daily, reflux with esophageal abnormality requiring frequent dilations, which he does 3 times a week using a bougie catheter, at home.  Patient is at risk for decompensation, therefore clindamycin will be started, to prevent infection, and he will be admitted for observation and symptomatic treatment.  CRITICAL CARE-yes Performed by: Daleen Bo   Nursing Notes Reviewed/ Care Coordinated Applicable Imaging Reviewed Interpretation of Laboratory Data incorporated into ED treatment   8:36 PM-Consult complete with hospitalist. Patient case explained and discussed.  He agrees to admit patient for further evaluation and treatment. Call ended at 8:42 PM  Plan: Admit    Final Clinical Impressions(s) / ED Diagnoses   Final diagnoses:  Aspiration pneumonia of right lung due to gastric secretions, unspecified part of lung [facility])    ED Discharge Orders    None       Daleen Bo, MD 07/19/17  2105

## 2017-07-19 NOTE — H&P (Addendum)
History and Physical    James Wolf NIO:[id number] DOB: Apr 04, 1950 DOA: 07/19/2017  PCP: Sherrilee Gilles, DO   Patient coming from: Home  Chief Complaint: Reflux, coughing, SOB, syncope   HPI: James Wolf is a 67 y.o. male with medical history significant for hypertension and history of esophageal cancer status post esophagectomy with gastric pull-through, the presenting to the emergency department for evaluation of shortness of breath, cough, and syncope. Patient reports that he went to bed last night in his usual state of health and woke this morning with a "reflux on the gush of stomach fluids."  He reports occasional regurgitation of gastric fluids that he describes as acid reflux and managed with Nexium at home. He had one of these episodes upon waking this morning that was followed immediately by a severe coughing fit. Patient reports that he choked on the fluids and aspirated. Patient's wife describes severe coughing fits, states that the patient appeared pale, and had a brief syncopal episode. He went on to have severe dyspnea and cough with a second brief syncopal episode. Patient denies any chest pain or palpitations and denies any lower extremity swelling or tenderness. He reports experiencing similar symptoms many times in the past, but not this severe.   ED Course: Upon arrival to the ED, patient is found to be afebrile, requiring 2 L/m supplemental oxygen in order to maintain saturations in the 90s, tachypneic, tachycardic, and with stable blood pressure. EKG features a sinus tachycardia with rate 112 noncontrast chest CT is negative for acute findings, but notable for stable appearance to his esophagectomy and gastric cold through, as well as right lower lobe atelectasis. Chemistry panel was unremarkable and CBC is notable for a leukocytosis to 19,400.  Troponin is undetectable. Patient was treated with a liter of normal saline in the ED and a dose of clindamycin. Tachycardia and  tachypnea improved, blood pressure remained stable, and patient will be admitted for ongoing evaluation and management of acute hypoxic respiratory failure suspected secondary to aspiration.  Review of Systems:  All other systems reviewed and apart from HPI, are negative.  Past Medical History:  Diagnosis Date  . Anemia   . Aspiration pneumonia ([place])   . Cancer ([place])    esophageal cancer 1995  . COPD (chronic obstructive pulmonary disease) ([place])   . GERD (gastroesophageal reflux disease)   . Hyperlipidemia   . Hypertension   . Osteoporosis     Past Surgical History:  Procedure Laterality Date  . partial esophageal ejectory       reports that he quit smoking about 24 years ago. He does not have any smokeless tobacco history on file. He reports that he drank alcohol. He reports that he does not use drugs.  Allergies  Allergen Reactions  . Zithromax [Azithromycin] Diarrhea    Causes pt's stomach to "tear up" does not want to take medication again.     History reviewed. No pertinent family history.   Prior to Admission medications   Medication Sig Start Date End Date Taking? Authorizing Provider  B Complex Vitamins (VITAMIN B COMPLEX PO) Take 1 tablet by mouth 2 (two) times daily. 03/10/07  Yes [provider]  diclofenac sodium (VOLTAREN) 1 % GEL 4 GRAM ON SKIN 4 TIMES A DAY AS NEEDED 09/24/15  Yes [provider]  esomeprazole (NEXIUM) 40 MG capsule Take 1 capsule by mouth 2 (two) times daily. 05/26/17  Yes [provider]  telmisartan (MICARDIS) 20 MG tablet Take 1 tablet by  mouth daily. 06/30/17  Yes [provider]  Vitamin D, Ergocalciferol, (DRISDOL) 50000 units CAPS capsule Take by mouth.   Yes [provider]    Physical Exam: Vitals:   07/19/17 1900 07/19/17 1944 07/19/17 2020 07/19/17 2025  BP:  134/87    Pulse: (!) 111 (!) 117    Resp:      Temp:    99.2 F (37.3 C)  TempSrc:    Oral  SpO2: 96% 97% 96%   Weight:        Height:          Constitutional: Not in acute distress, no pallor, no diaphoresis Eyes: PERTLA, lids and conjunctivae normal ENMT: Mucous membranes are moist. Posterior pharynx clear of any exudate or lesions.   Neck: normal, supple, no masses, no thyromegaly Respiratory: Mild tachypnea, speaking full sentances. Rhonchi at right base. No accessory muscle use.  Cardiovascular: Rate ~110 and regular. No extremity edema. No significant JVD. Abdomen: No distension, no tenderness, soft. Bowel sounds active.  Musculoskeletal: no clubbing / cyanosis. No joint deformity upper and lower extremities.   Skin: no significant rashes, lesions, ulcers. Warm, dry, well-perfused. Neurologic: CN 2-12 grossly intact. Sensation intact. Strength 5/5 in all 4 limbs.  Psychiatric: Alert and oriented x 3. Calm, cooperative.     Labs on Admission: I have personally reviewed following labs and imaging studies  CBC: Recent Labs  Lab 07/19/17 1759  WBC 19.4*  NEUTROABS 17.8*  HGB 14.3  HCT 43.3  MCV 90.4  PLT 702   Basic Metabolic Panel: Recent Labs  Lab 07/19/17 1759  NA 141  K 4.3  CL 107  CO2 26  GLUCOSE 155*  BUN 14  CREATININE 1.22  CALCIUM 8.7*   GFR: Estimated Creatinine Clearance: 57 mL/min (by C-G formula based on SCr of 1.22 mg/dL). Liver Function Tests: Recent Labs  Lab 07/19/17 1759  AST 27  ALT 25  ALKPHOS 42  BILITOT 0.6  PROT 7.4  ALBUMIN 3.9   No results for input(s): LIPASE, AMYLASE in the last 168 hours. No results for input(s): AMMONIA in the last 168 hours. Coagulation Profile: No results for input(s): INR, PROTIME in the last 168 hours. Cardiac Enzymes: Recent Labs  Lab 07/19/17 1759  TROPONINI <0.03   BNP (last 3 results) No results for input(s): PROBNP in the last 8760 hours. HbA1C: No results for input(s): HGBA1C in the last 72 hours. CBG: No results for input(s): GLUCAP in the last 168 hours. Lipid Profile: No results for input(s): CHOL, HDL,  LDLCALC, TRIG, CHOLHDL, LDLDIRECT in the last 72 hours. Thyroid Function Tests: No results for input(s): TSH, T4TOTAL, FREET4, T3FREE, THYROIDAB in the last 72 hours. Anemia Panel: No results for input(s): VITAMINB12, FOLATE, FERRITIN, TIBC, IRON, RETICCTPCT in the last 72 hours. Urine analysis: No results found for: COLORURINE, APPEARANCEUR, LABSPEC, PHURINE, GLUCOSEU, HGBUR, BILIRUBINUR, KETONESUR, PROTEINUR, UROBILINOGEN, NITRITE, LEUKOCYTESUR Sepsis Labs: @LABRCNTIP (procalcitonin:4,lacticidven:4) )No results found for this or any previous visit (from the past 240 hour(s)).   Radiological Exams on Admission: Ct Chest Wo Contrast  Result Date: 07/19/2017 CLINICAL DATA:  Per ED note.Pt woke up with acid obstructing airway. Pt became weak, dizzy and nauseated due to trying to clear airway. Reported by wife that he had 2 syncopal episodes. Pt reports became pale and diaphoretic. History of esophageal cancer with esophagectomy. History of COPD. EXAM: CT CHEST WITHOUT CONTRAST TECHNIQUE: Multidetector CT imaging of the chest was performed following the standard protocol without IV contrast. COMPARISON:  Chest x-ray  on 03/21/2009 FINDINGS: Cardiovascular: The heart size is normal. There is atherosclerotic calcification of the coronary vessels. No pericardial effusion. There is minimal atherosclerotic calcification of the thoracic aorta not associated with aneurysm. Normal noncontrast appearance of the pulmonary arteries. Mediastinum/Nodes: Status post esophagectomy and gastric pull-through. Expected air-fluid level identified within the thoracic stomach. The visualized portion of the thyroid gland has a normal appearance. No mediastinal, hilar, or axillary adenopathy. Lungs/Pleura: Airways are patent. There is atelectasis in the RIGHT LOWER lobe adjacent to the gastric pull-through. No suspicious pulmonary nodules, consolidations, or pleural effusions. Upper Abdomen: Nonobstructing calculi in the UPPER  pole region of the RIGHT kidney measure up to 4 millimeters in diameter. There is atherosclerotic calcification of the abdominal aorta. Musculoskeletal: Kyphotic curvature of the thoracic spine. Numerous wedge compression fractures, of indeterminate age. With IMPRESSION: 1. No acute abnormality. 2. Coronary artery disease. 3.  Aortic atherosclerosis.  (ICD10-I70.0) 4. Esophagectomy and gastric pull-through, unremarkable in appearance. 5. No suspicious pulmonary abnormality. RIGHT LOWER lobe atelectasis. 6. Numerous thoracic wedge compression fractures. Electronically Signed   By: Nolon Nations M.D.   On: 07/19/2017 18:33    EKG: Independently reviewed. Sinus tachycardia (rate 112).   Assessment/Plan   1. Acute hypoxic respiratory failure; suspected aspiration  - Presents with acute-onset of SOB and coughing fits, preceded by reflux of gastric fluids, followed by syncope x2  - Found to be tachycardic, tachypneic, hypoxic, and with marked leukocytosis  - No acute findings noted on non-contrast CT chest  - Aspiration suspected; PE considered but no evidence of DVT and no hx of VTE  - Continue supportive care with supplemental O2, SLP eval, check sputum culture, check procalcitonin, check d-dimer, favor continuing empiric abx given severity of illness and reassess need tomorrow    2. Syncope  - Pt reports 2 syncopal episodes prior to arrival  - Suspect this was neurally-mediated in setting of coughing fits - PE considered given hypoxia with clear lungs on imaging, persistent tachycardia, and hx of cancer; check d-dimer, orthostatic vitals, continue cardiac monitoring   3. Hypertension  - BP at goal  - Continue ARB  4. Hx of esophageal cancer  - Status-post esophagectomy with gastric pull-through  - CT chest appears stable  - Continue supportive care with PPI (pt will use his home Nexium, reporting Protonix not effective)     DVT prophylaxis: Lovenox  Code Status: Full  Family  Communication: Wife updated at bedside Consults called: None Admission status: Inpatient     Vianne Bulls, MD [facility] Pager [phone]  If 7PM-7AM, please contact night-coverage [email or web address] Password [facility]  07/19/2017, 9:05 PM

## 2017-07-19 NOTE — Progress Notes (Signed)
Pharmacy Antibiotic Note  James Wolf is a 67 y.o. male admitted on 07/19/2017 with pneumonia.  Pharmacy has been consulted for Unasyn  dosing.  Plan: Start Unasyn 3g IV q6h Pharmacy will continue to monitor renal function, cultures and patient progress.   Height: 5\' 5"  (165.1 cm) Weight: 170 lb (77.1 kg) IBW/kg (Calculated) : 61.5  Temp (24hrs), Avg:99.2 F (37.3 C), Min:99.1 F (37.3 C), Max:99.3 F (37.4 C)  Recent Labs  Lab 07/19/17 1759  WBC 19.4*  CREATININE 1.22    Estimated Creatinine Clearance: 57 mL/min (by C-G formula based on SCr of 1.22 mg/dL).    Allergies  Allergen Reactions  . Zithromax [Azithromycin] Diarrhea    Causes pt's stomach to "tear up" does not want to take medication again.     Antimicrobials this admission: 7/13 Unasyn >>    Dose adjustments this admission: n/a  Microbiology results: n/a  Thank you for allowing pharmacy to be a part of this patient's care.  Despina Pole 07/19/2017 9:54 PM

## 2017-07-20 ENCOUNTER — Inpatient Hospital Stay (HOSPITAL_COMMUNITY): Payer: BLUE CROSS/BLUE SHIELD

## 2017-07-20 DIAGNOSIS — K22711 Barrett's esophagus with high grade dysplasia: Secondary | ICD-10-CM

## 2017-07-20 LAB — BASIC METABOLIC PANEL
ANION GAP: 7 (ref 5–15)
BUN: 12 mg/dL (ref 8–23)
CO2: 26 mmol/L (ref 22–32)
Calcium: 8 mg/dL — ABNORMAL LOW (ref 8.9–10.3)
Chloride: 109 mmol/L (ref 98–111)
Creatinine, Ser: 1.14 mg/dL (ref 0.61–1.24)
GFR calc Af Amer: >60 mL/min (ref >60)
Glucose, Bld: 133 mg/dL — ABNORMAL HIGH (ref 70–99)
POTASSIUM: 3.7 mmol/L (ref 3.5–5.1)
SODIUM: 142 mmol/L (ref 135–145)

## 2017-07-20 LAB — PROCALCITONIN: PROCALCITONIN: 0.63 ng/mL

## 2017-07-20 LAB — CBC
HEMATOCRIT: 38.7 % — AB (ref 39.0–52.0)
HEMOGLOBIN: 12.6 g/dL — AB (ref 13.0–17.0)
MCH: 29.4 pg (ref 26.0–34.0)
MCHC: 32.6 g/dL (ref 30.0–36.0)
MCV: 90.2 fL (ref 78.0–100.0)
Platelets: 168 10*3/uL (ref 150–400)
RBC: 4.29 MIL/uL (ref 4.22–5.81)
RDW: 15.7 % — AB (ref 11.5–15.5)
WBC: 16.7 10*3/uL — AB (ref 4.0–10.5)

## 2017-07-20 IMAGING — CT CT ANGIO CHEST
2 of 6 series · 18 of 46 positions shown · IV contrast (Isovue)
Comparison: Prior CT scan of the chest yesterday, [DATE]

CLINICAL DATA: 67-year-old male with chest pain, history of
esophageal cancer and positive D-dimer. Evaluate for pulmonary
embolus.

EXAM:
CT ANGIOGRAPHY CHEST WITH CONTRAST
TECHNIQUE: Multidetector CT imaging of the chest was performed using the
standard protocol during bolus administration of intravenous
contrast. Multiplanar CT image reconstructions and MIPs were
obtained to evaluate the vascular anatomy.
CONTRAST:  100mL [4B] IOPAMIDOL ([4B]) INJECTION 76%

[Series 5: thins · axial · 0.63mm/px · z∈[+1277,+1563]mm · 15 of 314 slices shown]
[im 14/314  lung]
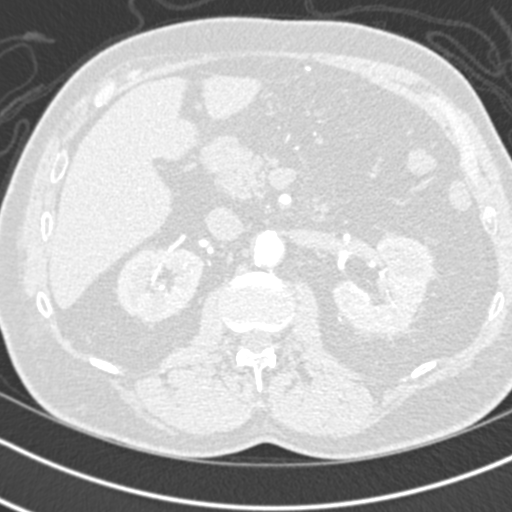
[im 41/314  soft-tissue]
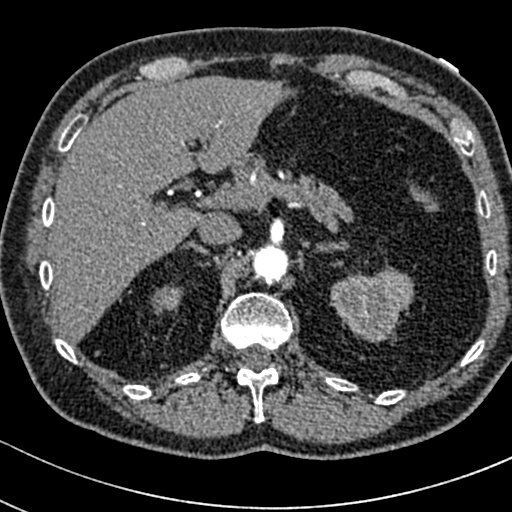
[im 55/314  lung]
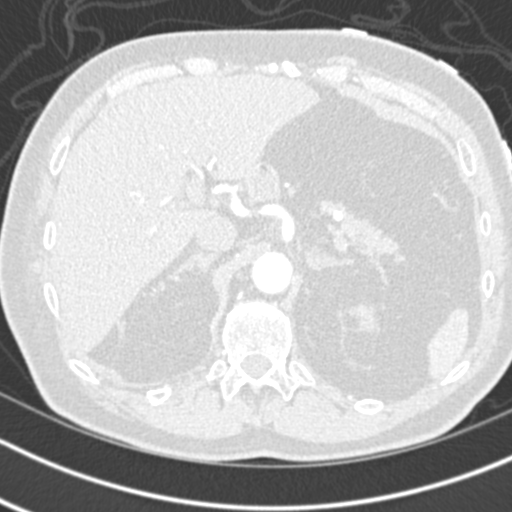
[im 82/314  soft-tissue]
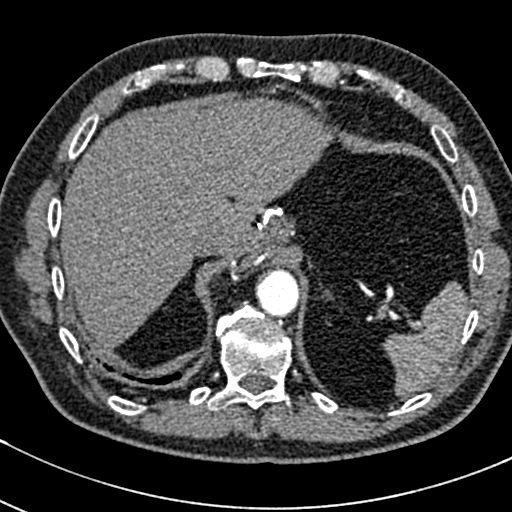
[im 96/314  lung]
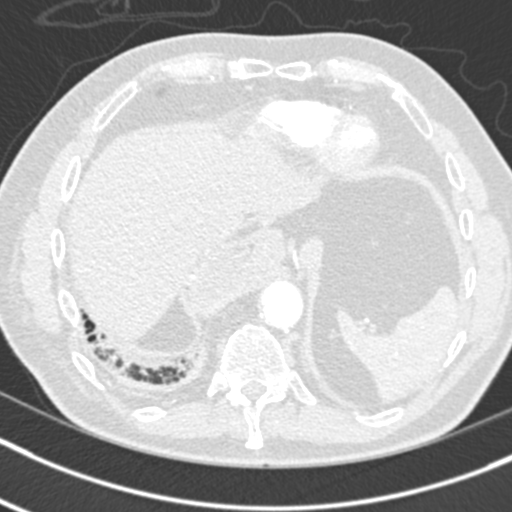
[im 123/314  soft-tissue]
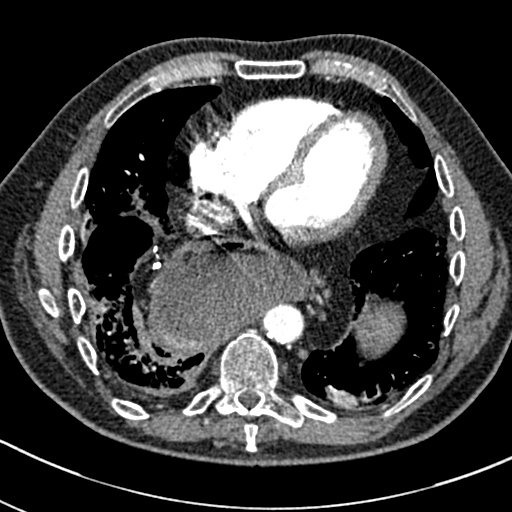
[im 137/314  lung]
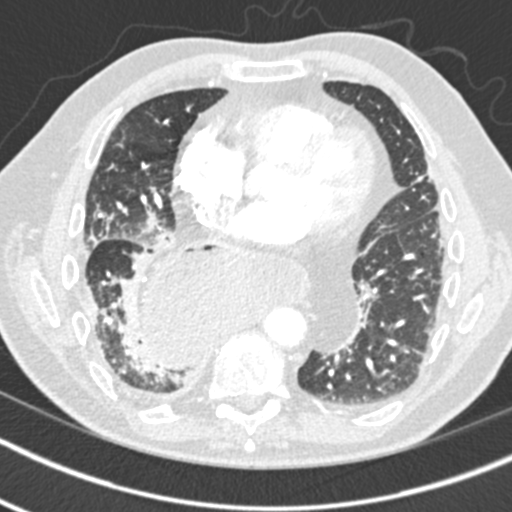
[im 164/314  soft-tissue]
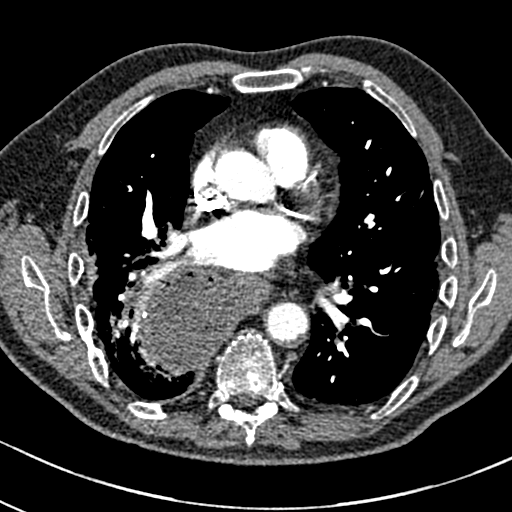
[im 177/314  lung]
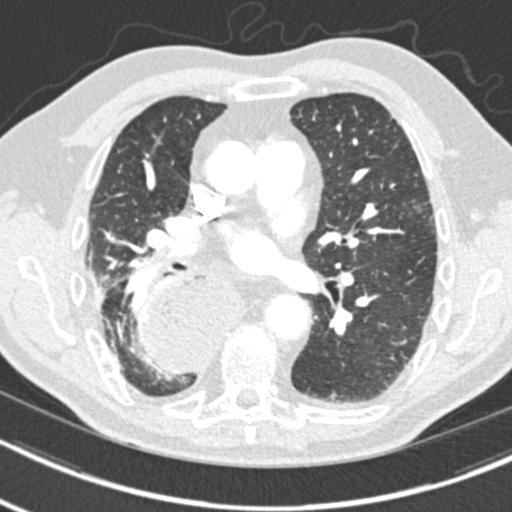
[im 191/314  soft-tissue]
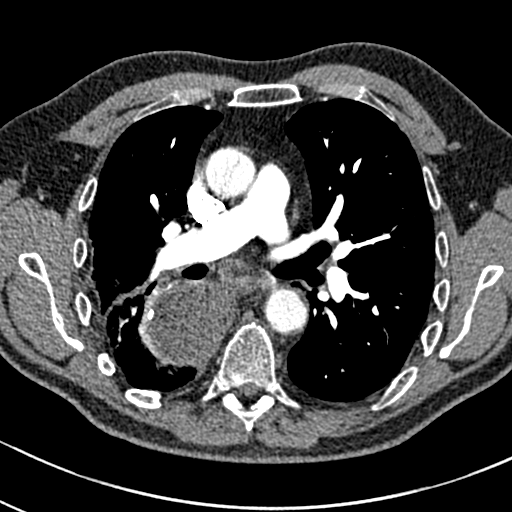
[im 218/314  lung]
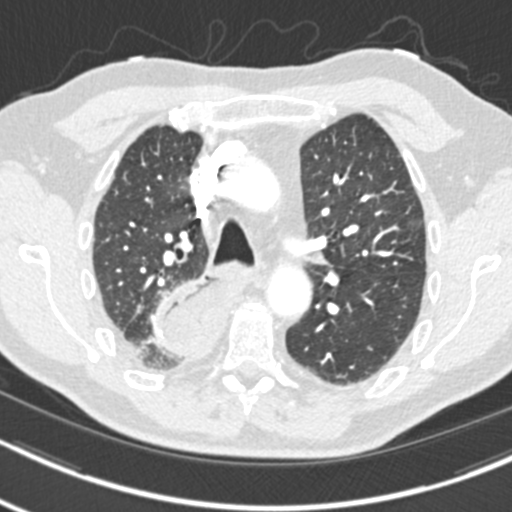
[im 232/314  soft-tissue]
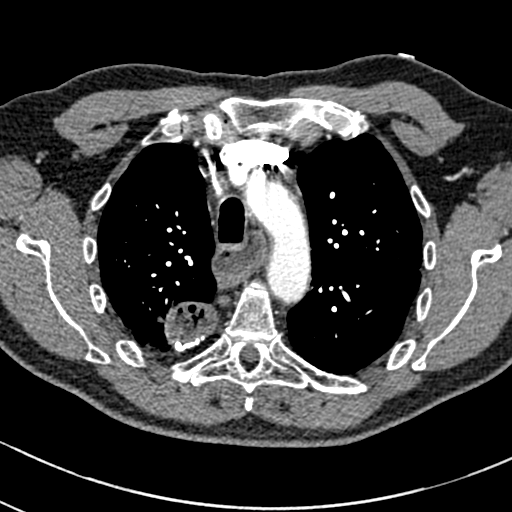
[im 259/314  lung]
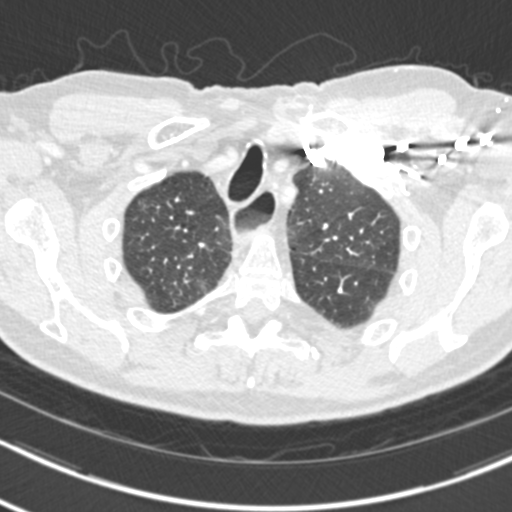
[im 273/314  soft-tissue]
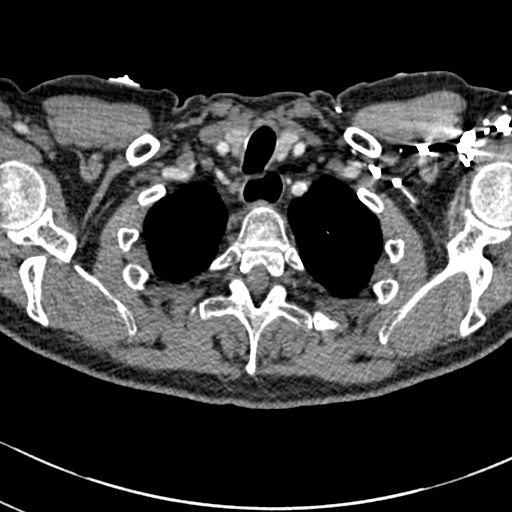
[im 300/314  lung]
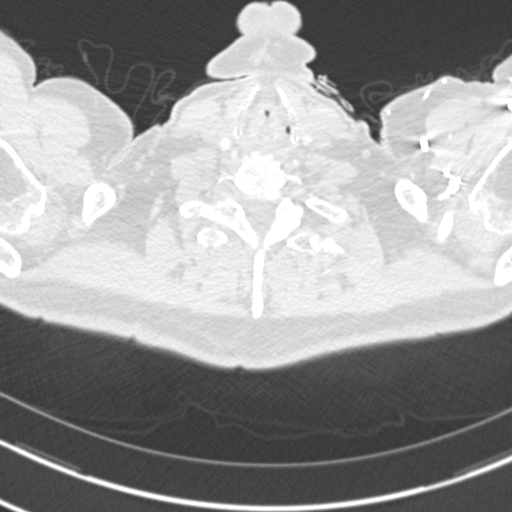

[Series 7: coronal mpr · coronal · 0.68mm/px · 3 of 151 slices shown]
[im 38/151  soft-tissue]
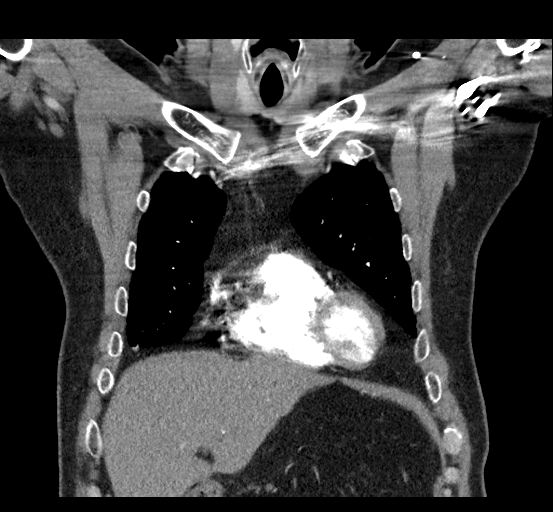
[im 76/151  soft-tissue]
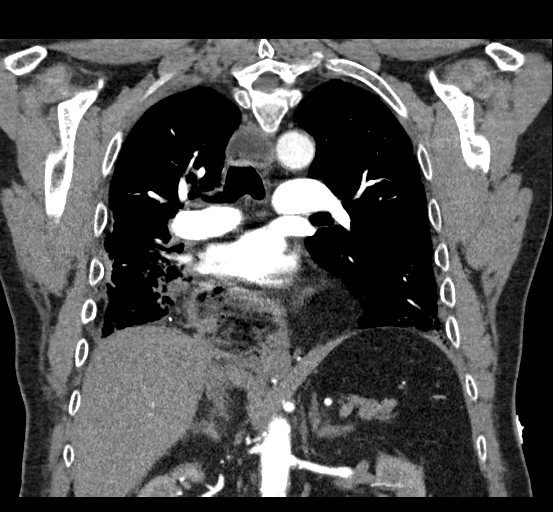
[im 113/151  soft-tissue]
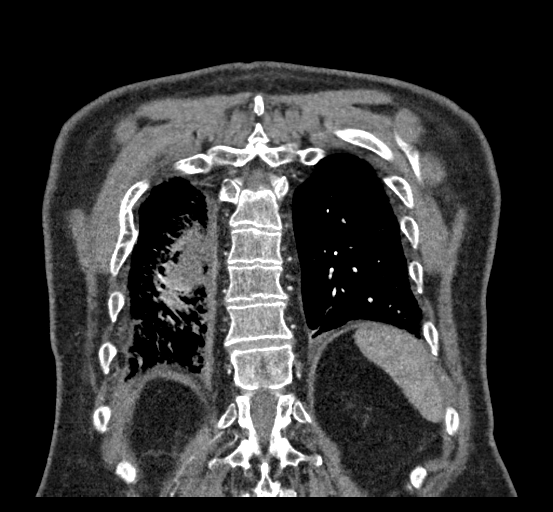

[18 of 46 positions shown; findings below may reference images not displayed]

FINDINGS: Cardiovascular: Adequate opacification of the pulmonary arteries to
the proximal segmental level. No evidence of central filling defect
to suggest acute pulmonary embolus. The main pulmonary artery is
normal in size. The thoracic aorta is normal in size. Four vessel
arch anatomy. The left vertebral artery arises directly from the
aorta. Mild scattered atherosclerotic plaque throughout the thoracic
aorta. Calcifications present throughout the coronary arteries. The
heart remains normal in size. No pericardial effusion.

Mediastinum/Nodes: Unremarkable appearance of the thyroid gland. No
suspicious mediastinal or hilar adenopathy. Surgical changes of
prior esophagectomy with gastric pull-through.

Lungs/Pleura: Similar appearance of passive atelectasis in the right
lower lobe compared to yesterday's imaging. Slightly increased
subsegmental atelectasis in the inferior left lower lobe and
inferior middle lobe as well. The lungs are otherwise clear. No new
pleural effusion or pneumothorax.

Upper Abdomen: No acute abnormality in the visualized upper abdomen.

Musculoskeletal: No chest wall abnormality. No acute or significant
osseous findings.

Review of the MIP images confirms the above findings.
IMPRESSION: 1. Negative for acute pulmonary embolus, pneumonia or other acute
cardiopulmonary process.
2. Stable findings of esophagectomy and gastric pull-through with
associated chronic right lower lobe atelectasis.
3. Coronary artery calcifications.
4.  Aortic Atherosclerosis ([4B]-170.0)

## 2017-07-20 MED ORDER — SUCRALFATE 1 GM/10ML PO SUSP
1.0000 g | Freq: Three times a day (TID) | ORAL | Status: DC
  Administered 2017-07-20 – 2017-07-21 (×3): 1 g via ORAL
  Filled 2017-07-20 (×3): qty 10

## 2017-07-20 MED ORDER — ESOMEPRAZOLE MAGNESIUM 20 MG PO CPDR: 40.0000 mg | DELAYED_RELEASE_CAPSULE | Freq: Two times a day (BID) | ORAL | Status: DC

## 2017-07-20 MED ORDER — PANTOPRAZOLE SODIUM 40 MG PO TBEC: 40.0000 mg | DELAYED_RELEASE_TABLET | Freq: Two times a day (BID) | ORAL | Status: DC

## 2017-07-20 MED ORDER — ESOMEPRAZOLE MAGNESIUM 40 MG PO CPDR
40.0000 mg | DELAYED_RELEASE_CAPSULE | Freq: Two times a day (BID) | ORAL | Status: DC
  Administered 2017-07-20 – 2017-07-21 (×2): 40 mg via ORAL

## 2017-07-20 MED ORDER — IOPAMIDOL (ISOVUE-370) INJECTION 76%
100.0000 mL | Freq: Once | INTRAVENOUS | Status: AC | PRN
  Administered 2017-07-20: 100 mL via INTRAVENOUS

## 2017-07-20 NOTE — Progress Notes (Signed)
PROGRESS NOTE    James Wolf  IOX:[id number] DOB: August 02, 1950 DOA: 07/19/2017 PCP: Sherrilee Gilles, DO    Brief Narrative:  67 yo male who presented with dyspnea, cough and syncope. 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  He does have esophageal gastric reflux disease, on the day of admission he experienced severe reflux that woke him up from his sleep, followed immediately by severe coughing and dyspnea.  After several episodes of coughing spells he had a brief syncope episode.  On the initial physical examination blood pressure 134/87, heart rate 117, temperature 99.2, oxygen saturation 96% on submental oxygen.  Moist mucous membranes, lungs with rhonchi at right base, heart S1-S2 present, tachycardic, regular, abdomen soft nontender, no lower extremity edema.  Sodium 141, potassium 4.3, chloride 1 7, bicarb 26, glucose 155, creatinine 1.22, 0.03, procalcitonin 0.22, white count 19.4, hemoglobin 14.3, hematocrit 43.3, platelets 189, noncontrast CT chest with right lower lobe atelectasis no pneumonic infiltrate.  EKG sinus tachycardia, normal intervals, normal axis.  Patient was admitted to the hospital with working diagnosis of acute hypoxic respiratory failure due to aspiration pneumonitis.  Assessment & Plan:   Principal Problem:   Aspiration pneumonitis ([place]) Active Problems:   Hypertension   COPD (chronic obstructive pulmonary disease) ([place])   History of esophageal cancer   Acute respiratory failure with hypoxia (HCC)   Syncope   1. Acute hypoxic respiratory failure due to aspiration pneumonitis due to severe reflux disease (present on admission). Will continue oxymetry monitoring and supplemental 02 per [place], follow on repeat chest imaging today. Patient with positive d dimer and will get CT angiography. Will keep antibiotic therapy with IV Unasyn for the next 24 hours with  intentions to discontinue pending result of imaging and procalcitonin. Aspiration precautions, head elevated 45 degrees at all times.   2. GERD with severe reflux. Patient is sp esophagectomy and gastric pull-through, will continue antiacid therapy with po pantoprazole and will add sucralfate, patient will bring his own Nexium. Will request bedside swallow evaluation. Will add sucralfate.   3. HTN. Will continue blood pressure control with irbesartan. Systolic blood pressure 268 to 131 mmHg.   4. Barrett's esophagus with high grade dysplasia sp esophagectomy and gastric pull-through. Last EGD at [facility], 10/2016 Grade B esophagitis at the anastomosis, recommendation for bid proton pump inhibitor. (Old records personally reviewed).   DVT prophylaxis: enoxaparin   Code Status: full Family Communication: I spoke with patient's family at the bedside and all questions were addressed.  Disposition Plan/ discharge barriers: Possible discharge in am, pending oxymetry and chest imaging.    Consultants:     Procedures:     Antimicrobials:   IV Unasyn     Subjective: Patient is feeling well, no further dyspnea or cough, no nausea or chest pain, no lower extremity edema or history of DVT in the past.   Objective: Vitals:   07/19/17 2228 07/19/17 2232 07/20/17 0159 07/20/17 0557  BP: 121/76 115/86 119/79 131/80  Pulse: (!) 111 (!) 116 (!) 112 90  Resp: 20  18 18   Temp:   98.9 F (37.2 C) 98.9 F (37.2 C)  TempSrc:   Oral Oral  SpO2: 96% 96% 97% 95%  Weight:    77.3 kg (170 lb 6.7 oz)  Height:        Intake/Output Summary (Last 24 hours) at 07/20/2017 0858 Last data filed at 07/20/2017 0527 Gross per 24 hour  Intake 1235.42 ml  Output -  Net 1235.42 ml   Filed Weights   07/19/17 1657 07/19/17 2226 07/20/17 0557  Weight: 77.1 kg (170 lb) 77.4 kg (170 lb 11.2 oz) 77.3 kg (170 lb 6.7 oz)    Examination:   General: Not in pain or dyspnea.  Neurology: Awake and alert, non focal    E ENT: mild pallor, no icterus, oral mucosa moist Cardiovascular: No JVD. S1-S2 present, rhythmic, no gallops, rubs, or murmurs. No lower extremity edema. Pulmonary: positive breath sounds bilaterally, adequate air movement, no wheezing, rhonchi or rales. Gastrointestinal. Abdomen with no organomegaly, non tender, no rebound or guarding Skin. No rashes Musculoskeletal: no joint deformities     Data Reviewed: I have personally reviewed following labs and imaging studies  CBC: Recent Labs  Lab 07/19/17 1759 07/20/17 0628  WBC 19.4* 16.7*  NEUTROABS 17.8*  --   HGB 14.3 12.6*  HCT 43.3 38.7*  MCV 90.4 90.2  PLT 189 165   Basic Metabolic Panel: Recent Labs  Lab 07/19/17 1759 07/20/17 0628  NA 141 142  K 4.3 3.7  CL 107 109  CO2 26 26  GLUCOSE 155* 133*  BUN 14 12  CREATININE 1.22 1.14  CALCIUM 8.7* 8.0*   GFR: Estimated Creatinine Clearance: 61.1 mL/min (by C-G formula based on SCr of 1.14 mg/dL). Liver Function Tests: Recent Labs  Lab 07/19/17 1759  AST 27  ALT 25  ALKPHOS 42  BILITOT 0.6  PROT 7.4  ALBUMIN 3.9   No results for input(s): LIPASE, AMYLASE in the last 168 hours. No results for input(s): AMMONIA in the last 168 hours. Coagulation Profile: No results for input(s): INR, PROTIME in the last 168 hours. Cardiac Enzymes: Recent Labs  Lab 07/19/17 1759  TROPONINI <0.03   BNP (last 3 results) No results for input(s): PROBNP in the last 8760 hours. HbA1C: No results for input(s): HGBA1C in the last 72 hours. CBG: No results for input(s): GLUCAP in the last 168 hours. Lipid Profile: No results for input(s): CHOL, HDL, LDLCALC, TRIG, CHOLHDL, LDLDIRECT in the last 72 hours. Thyroid Function Tests: No results for input(s): TSH, T4TOTAL, FREET4, T3FREE, THYROIDAB in the last 72 hours. Anemia Panel: No results for input(s): VITAMINB12, FOLATE, FERRITIN, TIBC, IRON, RETICCTPCT in the last 72 hours.    Radiology Studies: I have reviewed all of  the imaging during this hospital visit personally     Scheduled Meds: . B-complex with vitamin C  1 tablet Oral BID  . enoxaparin (LOVENOX) injection  40 mg Subcutaneous Q24H  . irbesartan  75 mg Oral Daily  . sodium chloride flush  3 mL Intravenous Q12H   Continuous Infusions: . sodium chloride 75 mL/hr at 07/19/17 2312  . ampicillin-sulbactam (UNASYN) IV Stopped (07/20/17 0557)  . famotidine (PEPCID) IV 20 mg (07/20/17 0810)     LOS: 1 day        Reem Fleury Gerome Apley, MD [facility] Pager [phone]

## 2017-07-21 LAB — BASIC METABOLIC PANEL
ANION GAP: 6 (ref 5–15)
BUN: 9 mg/dL (ref 8–23)
CALCIUM: 8.2 mg/dL — AB (ref 8.9–10.3)
CO2: 28 mmol/L (ref 22–32)
CREATININE: 1.08 mg/dL (ref 0.61–1.24)
Chloride: 108 mmol/L (ref 98–111)
GFR calc Af Amer: >60 mL/min (ref >60)
GLUCOSE: 113 mg/dL — AB (ref 70–99)
Potassium: 3.8 mmol/L (ref 3.5–5.1)
Sodium: 142 mmol/L (ref 135–145)

## 2017-07-21 LAB — CBC WITH DIFFERENTIAL/PLATELET
Basophils Absolute: 0 10*3/uL (ref 0.0–0.1)
Basophils Relative: 0 %
Eosinophils Absolute: 0.2 10*3/uL (ref 0.0–0.7)
Eosinophils Relative: 1 %
HEMATOCRIT: 37.6 % — AB (ref 39.0–52.0)
HEMOGLOBIN: 12.3 g/dL — AB (ref 13.0–17.0)
LYMPHS PCT: 10 %
Lymphs Abs: 1.3 10*3/uL (ref 0.7–4.0)
MCH: 29.6 pg (ref 26.0–34.0)
MCHC: 32.7 g/dL (ref 30.0–36.0)
MCV: 90.6 fL (ref 78.0–100.0)
Monocytes Absolute: 1.3 10*3/uL — ABNORMAL HIGH (ref 0.1–1.0)
Monocytes Relative: 9 %
NEUTROS PCT: 80 %
Neutro Abs: 10.6 10*3/uL — ABNORMAL HIGH (ref 1.7–7.7)
PLATELETS: 160 10*3/uL (ref 150–400)
RBC: 4.15 MIL/uL — AB (ref 4.22–5.81)
RDW: 15.6 % — AB (ref 11.5–15.5)
WBC: 13.4 10*3/uL — AB (ref 4.0–10.5)

## 2017-07-21 LAB — PROCALCITONIN: Procalcitonin: 0.31 ng/mL

## 2017-07-21 MED ORDER — AMOXICILLIN-POT CLAVULANATE 875-125 MG PO TABS: 1.0000 | ORAL_TABLET | Freq: Two times a day (BID) | ORAL | 0 refills | Status: AC

## 2017-07-21 MED ORDER — SUCRALFATE 1 GM/10ML PO SUSP: 1.0000 g | Freq: Three times a day (TID) | ORAL | 0 refills | Status: DC

## 2017-07-21 NOTE — Progress Notes (Signed)
Patient discharged home with personal belongings and prescriptions. Patient's IV removed and site intact.

## 2017-07-21 NOTE — Discharge Summary (Signed)
Physician Discharge Summary  TRI CHITTICK FYB:[id number] DOB: [birth year] DOA: 07/19/2017  PCP: Sherrilee Gilles, DO  Admit date: 07/19/2017  Discharge date: 07/21/2017  Admitted From:Home  Disposition:  Home  Recommendations for Outpatient Follow-up:  1. Follow up with PCP in 1-2 weeks  Home Health:N/A  Equipment/Devices:N/A  Discharge Condition:Stable  CODE STATUS: Full  Diet recommendation: Heart Healthy  Brief/Interim Summary:  67 yo male who presented with dyspnea, cough and syncope. 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  He does have esophageal gastric reflux disease, on the day of admission he experienced severe reflux that woke him up from his sleep, followed immediately by severe coughing and dyspnea.  After several episodes of coughing spells he also had a brief syncopal episode.  On the initial physical examination blood pressure 134/87, heart rate 117, temperature 99.2, oxygen saturation 96% on submental oxygen.  Moist mucous membranes, lungs with rhonchi at right base, heart S1-S2 present, tachycardic, regular, abdomen soft nontender, no lower extremity edema.  Sodium 141, potassium 4.3, chloride 1 7, bicarb 26, glucose 155, creatinine 1.22, 0.03, procalcitonin 0.22, white count 19.4, hemoglobin 14.3, hematocrit 43.3, platelets 189, noncontrast CT chest with right lower lobe atelectasis no pneumonic infiltrate.  EKG sinus tachycardia, normal intervals, normal axis.  Patient was admitted to the hospital with working diagnosis of acute hypoxic respiratory failure due to aspiration pneumonitis.  He has empirically remained on IV Unasyn with improvement in leukocytosis, but continues to have some elevated procalcitonin levels.  He has no significant signs or symptoms of pneumonia, but does have some COPD at home.  He will remain on Augmentin for 5 more days to finish a  7-day course of treatment.  Additionally, he has been recommended to remain on his home oral Nexium and use Zantac or other H2 blockers as needed.  We will also prescribe him sucralfate to continue taking and follow-up with his GI physician in [place], [place].  I have recommended that he discuss possible use of a longer acting PPI such as Dexilant in the near future to assist with his symptoms.   Discharge Diagnoses:  Principal Problem:   Aspiration pneumonitis ([place]) Active Problems:   Hypertension   COPD (chronic obstructive pulmonary disease) ([place])   History of esophageal cancer   Acute respiratory failure with hypoxia (HCC)   Syncope  1. Acute hypoxemic respiratory failure secondary to aspiration pneumonitis related to severe reflux disease.  Continue on home PPI as prescribed with H2 blockers as needed and Carafate prescribed.  Follow-up with GI and consider use of long-acting PPI as noted above.  Will empirically complete course of treatment with Augmentin for 5 more days which has been prescribed. 2. GERD with severe reflux in the setting of partial esophagectomy and gastric pull-through.  Continue medications as prescribed with GI follow-up in the near future. 3. Barrett's esophagitis related to above.  Last EGD at [facility] 10/2016 with grade B esophagitis.  Continue PPI twice daily as prescribed. 4. Hypertension.  This is controlled with home medication of irbesartan.  Discharge Instructions  Discharge Instructions    Diet - low sodium heart healthy   Complete by:  As directed    Increase activity slowly   Complete by:  As directed      Allergies as of 07/21/2017      Reactions   Zithromax [azithromycin] Diarrhea   Causes pt's stomach to "tear up" does not want to  take medication again.       Medication List    TAKE these medications   amoxicillin-clavulanate 875-125 MG tablet Commonly known as:  AUGMENTIN Take 1 tablet by mouth 2 (two) times daily for 5 days.    diclofenac sodium 1 % Gel Commonly known as:  VOLTAREN 4 GRAM ON SKIN 4 TIMES A DAY AS NEEDED   esomeprazole 40 MG capsule Commonly known as:  NEXIUM Take 1 capsule by mouth 2 (two) times daily.   sucralfate 1 GM/10ML suspension Commonly known as:  CARAFATE Take 10 mLs (1 g total) by mouth 4 (four) times daily -  with meals and at bedtime.   telmisartan 20 MG tablet Commonly known as:  MICARDIS Take 1 tablet by mouth daily.   VITAMIN B COMPLEX PO Take 1 tablet by mouth 2 (two) times daily.   Vitamin D (Ergocalciferol) 50000 units Caps capsule Commonly known as:  DRISDOL Take by mouth.      Follow-up Information    Sherrilee Gilles, DO Follow up in 2 week(s).   Specialty:  Family Medicine Contact information: [place] [address] [phone]          Allergies  Allergen Reactions  . Zithromax [Azithromycin] Diarrhea    Causes pt's stomach to "tear up" does not want to take medication again.     Consultations:  None   Procedures/Studies: Ct Chest Wo Contrast  Result Date: 07/19/2017 CLINICAL DATA:  Per ED note.Pt woke up with acid obstructing airway. Pt became weak, dizzy and nauseated due to trying to clear airway. Reported by wife that he had 2 syncopal episodes. Pt reports became pale and diaphoretic. History of esophageal cancer with esophagectomy. History of COPD. EXAM: CT CHEST WITHOUT CONTRAST TECHNIQUE: Multidetector CT imaging of the chest was performed following the standard protocol without IV contrast. COMPARISON:  Chest x-ray on 03/21/2009 FINDINGS: Cardiovascular: The heart size is normal. There is atherosclerotic calcification of the coronary vessels. No pericardial effusion. There is minimal atherosclerotic calcification of the thoracic aorta not associated with aneurysm. Normal noncontrast appearance of the pulmonary arteries. Mediastinum/Nodes: Status post esophagectomy and gastric pull-through. Expected air-fluid level identified within  the thoracic stomach. The visualized portion of the thyroid gland has a normal appearance. No mediastinal, hilar, or axillary adenopathy. Lungs/Pleura: Airways are patent. There is atelectasis in the RIGHT LOWER lobe adjacent to the gastric pull-through. No suspicious pulmonary nodules, consolidations, or pleural effusions. Upper Abdomen: Nonobstructing calculi in the UPPER pole region of the RIGHT kidney measure up to 4 millimeters in diameter. There is atherosclerotic calcification of the abdominal aorta. Musculoskeletal: Kyphotic curvature of the thoracic spine. Numerous wedge compression fractures, of indeterminate age. With IMPRESSION: 1. No acute abnormality. 2. Coronary artery disease. 3.  Aortic atherosclerosis.  (ICD10-I70.0) 4. Esophagectomy and gastric pull-through, unremarkable in appearance. 5. No suspicious pulmonary abnormality. RIGHT LOWER lobe atelectasis. 6. Numerous thoracic wedge compression fractures. Electronically Signed   By: Nolon Nations M.D.   On: 07/19/2017 18:33   Ct Angio Chest Pe W Or Wo Contrast  Result Date: 07/20/2017 CLINICAL DATA:  67 year old male with chest pain, history of esophageal cancer and positive D-dimer. Evaluate for pulmonary embolus. EXAM: CT ANGIOGRAPHY CHEST WITH CONTRAST TECHNIQUE: Multidetector CT imaging of the chest was performed using the standard protocol during bolus administration of intravenous contrast. Multiplanar CT image reconstructions and MIPs were obtained to evaluate the vascular anatomy. CONTRAST:  150mL ISOVUE-370 IOPAMIDOL (ISOVUE-370) INJECTION 76% COMPARISON:  Prior CT scan of the chest  yesterday, 07/19/2017 FINDINGS: Cardiovascular: Adequate opacification of the pulmonary arteries to the proximal segmental level. No evidence of central filling defect to suggest acute pulmonary embolus. The main pulmonary artery is normal in size. The thoracic aorta is normal in size. Four vessel arch anatomy. The left vertebral artery arises directly  from the aorta. Mild scattered atherosclerotic plaque throughout the thoracic aorta. Calcifications present throughout the coronary arteries. The heart remains normal in size. No pericardial effusion. Mediastinum/Nodes: Unremarkable appearance of the thyroid gland. No suspicious mediastinal or hilar adenopathy. Surgical changes of prior esophagectomy with gastric pull-through. Lungs/Pleura: Similar appearance of passive atelectasis in the right lower lobe compared to yesterday's imaging. Slightly increased subsegmental atelectasis in the inferior left lower lobe and inferior middle lobe as well. The lungs are otherwise clear. No new pleural effusion or pneumothorax. Upper Abdomen: No acute abnormality in the visualized upper abdomen. Musculoskeletal: No chest wall abnormality. No acute or significant osseous findings. Review of the MIP images confirms the above findings. IMPRESSION: 1. Negative for acute pulmonary embolus, pneumonia or other acute cardiopulmonary process. 2. Stable findings of esophagectomy and gastric pull-through with associated chronic right lower lobe atelectasis. 3. Coronary artery calcifications. 4.  Aortic Atherosclerosis (ICD10-170.0) Electronically Signed   By: Jacqulynn Cadet M.D.   On: 07/20/2017 12:05    Discharge Exam: Vitals:   07/20/17 2141 07/21/17 0641  BP: 133/87 139/84  Pulse: 97 89  Resp:    Temp: 98.8 F (37.1 C) 98.2 F (36.8 C)  SpO2: 92% 94%   Vitals:   07/20/17 1403 07/20/17 1800 07/20/17 2141 07/21/17 0641  BP: 117/76 124/71 133/87 139/84  Pulse: 78 92 97 89  Resp: 18 20    Temp: 98.5 F (36.9 C) 99.6 F (37.6 C) 98.8 F (37.1 C) 98.2 F (36.8 C)  TempSrc: Oral Oral Oral Oral  SpO2: 99% 95% 92% 94%  Weight:    77.1 kg (169 lb 15.6 oz)  Height:        General: Pt is alert, awake, not in acute distress Cardiovascular: RRR, S1/S2 +, no rubs, no gallops Respiratory: CTA bilaterally, no wheezing, no rhonchi Abdominal: Soft, NT, ND, bowel sounds  + Extremities: no edema, no cyanosis    The results of significant diagnostics from this hospitalization (including imaging, microbiology, ancillary and laboratory) are listed below for reference.     Microbiology: No results found for this or any previous visit (from the past 240 hour(s)).   Labs: BNP (last 3 results) No results for input(s): BNP in the last 8760 hours. Basic Metabolic Panel: Recent Labs  Lab 07/19/17 1759 07/20/17 0628 07/21/17 0742  NA 141 142 142  K 4.3 3.7 3.8  CL 107 109 108  CO2 26 26 28   GLUCOSE 155* 133* 113*  BUN 14 12 9   CREATININE 1.22 1.14 1.08  CALCIUM 8.7* 8.0* 8.2*   Liver Function Tests: Recent Labs  Lab 07/19/17 1759  AST 27  ALT 25  ALKPHOS 42  BILITOT 0.6  PROT 7.4  ALBUMIN 3.9   No results for input(s): LIPASE, AMYLASE in the last 168 hours. No results for input(s): AMMONIA in the last 168 hours. CBC: Recent Labs  Lab 07/19/17 1759 07/20/17 0628 07/21/17 0742  WBC 19.4* 16.7* 13.4*  NEUTROABS 17.8*  --  10.6*  HGB 14.3 12.6* 12.3*  HCT 43.3 38.7* 37.6*  MCV 90.4 90.2 90.6  PLT 189 168 160   Cardiac Enzymes: Recent Labs  Lab 07/19/17 1759  TROPONINI <0.03   BNP:  Invalid input(s): POCBNP CBG: No results for input(s): GLUCAP in the last 168 hours. D-Dimer Recent Labs    07/19/17 1759  DDIMER 0.90*   Hgb A1c No results for input(s): HGBA1C in the last 72 hours. Lipid Profile No results for input(s): CHOL, HDL, LDLCALC, TRIG, CHOLHDL, LDLDIRECT in the last 72 hours. Thyroid function studies No results for input(s): TSH, T4TOTAL, T3FREE, THYROIDAB in the last 72 hours.  Invalid input(s): FREET3 Anemia work up No results for input(s): VITAMINB12, FOLATE, FERRITIN, TIBC, IRON, RETICCTPCT in the last 72 hours. Urinalysis No results found for: COLORURINE, APPEARANCEUR, LABSPEC, [place], GLUCOSEU, HGBUR, BILIRUBINUR, KETONESUR, PROTEINUR, UROBILINOGEN, NITRITE, LEUKOCYTESUR Sepsis Labs Invalid input(s):  PROCALCITONIN,  WBC,  LACTICIDVEN Microbiology No results found for this or any previous visit (from the past 240 hour(s)).   Time coordinating discharge: 35 minutes  SIGNED:   Rodena Goldmann, DO [facility] 07/21/2017, 10:00 AM Pager [phone]  If 7PM-7AM, please contact night-coverage [email or web address] Password [email or web address]

## 2017-07-22 LAB — HIV ANTIBODY (ROUTINE TESTING W REFLEX): HIV Screen 4th Generation wRfx: NONREACTIVE

## 2018-11-02 ENCOUNTER — Other Ambulatory Visit: Payer: Self-pay

## 2018-11-02 ENCOUNTER — Emergency Department (HOSPITAL_COMMUNITY): Payer: BC Managed Care – PPO

## 2018-11-02 ENCOUNTER — Emergency Department (HOSPITAL_COMMUNITY)
Admission: EM | Admit: 2018-11-02 | Discharge: 2018-11-03 | Disposition: A | Discharge status: Home / Self Care | Payer: BC Managed Care – PPO | Attending: Emergency Medicine | Admitting: Emergency Medicine

## 2018-11-02 ENCOUNTER — Encounter (HOSPITAL_COMMUNITY): Payer: Self-pay | Admitting: Emergency Medicine

## 2018-11-02 DIAGNOSIS — Z79899 Other long term (current) drug therapy: Secondary | ICD-10-CM | POA: Diagnosis not present

## 2018-11-02 DIAGNOSIS — Z87891 Personal history of nicotine dependence: Secondary | ICD-10-CM | POA: Insufficient documentation

## 2018-11-02 DIAGNOSIS — I10 Essential (primary) hypertension: Secondary | ICD-10-CM | POA: Diagnosis not present

## 2018-11-02 DIAGNOSIS — J449 Chronic obstructive pulmonary disease, unspecified: Secondary | ICD-10-CM | POA: Insufficient documentation

## 2018-11-02 DIAGNOSIS — Z20828 Contact with and (suspected) exposure to other viral communicable diseases: Secondary | ICD-10-CM | POA: Insufficient documentation

## 2018-11-02 DIAGNOSIS — J189 Pneumonia, unspecified organism: Secondary | ICD-10-CM | POA: Diagnosis not present

## 2018-11-02 DIAGNOSIS — R05 Cough: Secondary | ICD-10-CM | POA: Diagnosis present

## 2018-11-02 LAB — BASIC METABOLIC PANEL
Anion gap: 11 (ref 5–15)
BUN: 17 mg/dL (ref 8–23)
CO2: 25 mmol/L (ref 22–32)
Calcium: 9.1 mg/dL (ref 8.9–10.3)
Chloride: 103 mmol/L (ref 98–111)
Creatinine, Ser: 1.32 mg/dL — ABNORMAL HIGH (ref 0.61–1.24)
GFR calc Af Amer: >60 mL/min (ref >60)
GFR calc non Af Amer: 55 mL/min — ABNORMAL LOW (ref >60)
Glucose, Bld: 134 mg/dL — ABNORMAL HIGH (ref 70–99)
Potassium: 4.1 mmol/L (ref 3.5–5.1)
Sodium: 139 mmol/L (ref 135–145)

## 2018-11-02 LAB — CBC WITH DIFFERENTIAL/PLATELET
Abs Immature Granulocytes: 0.06 10*3/uL (ref 0.00–0.07)
Basophils Absolute: 0.1 10*3/uL (ref 0.0–0.1)
Basophils Relative: 0 %
Eosinophils Absolute: 0.1 10*3/uL (ref 0.0–0.5)
Eosinophils Relative: 0 %
HCT: 47.3 % (ref 39.0–52.0)
Hemoglobin: 15 g/dL (ref 13.0–17.0)
Immature Granulocytes: 0 %
Lymphocytes Relative: 9 %
Lymphs Abs: 1.4 10*3/uL (ref 0.7–4.0)
MCH: 29.4 pg (ref 26.0–34.0)
MCHC: 31.7 g/dL (ref 30.0–36.0)
MCV: 92.7 fL (ref 80.0–100.0)
Monocytes Absolute: 1.2 10*3/uL — ABNORMAL HIGH (ref 0.1–1.0)
Monocytes Relative: 8 %
Neutro Abs: 12.6 10*3/uL — ABNORMAL HIGH (ref 1.7–7.7)
Neutrophils Relative %: 83 %
Platelets: 248 10*3/uL (ref 150–400)
RBC: 5.1 MIL/uL (ref 4.22–5.81)
RDW: 14 % (ref 11.5–15.5)
WBC: 15.3 10*3/uL — ABNORMAL HIGH (ref 4.0–10.5)
nRBC: 0 % (ref 0.0–0.2)

## 2018-11-02 NOTE — ED Triage Notes (Signed)
Pt states he aspirated 3 days ago due to reflux and has been coughing since. Pt went to urgent care in [place] and was told come to the er. Pt states he has pneumonia.

## 2018-11-03 LAB — SARS CORONAVIRUS 2 (TAT 6-24 HRS): SARS Coronavirus 2: NEGATIVE

## 2018-11-03 IMAGING — DX DG CHEST 2V
2 series · 2 of 2 positions shown · non-contrast
Comparison: Chest radiograph dated [DATE]

CLINICAL DATA: 68-year-old male with cough.

EXAM:
CHEST - 2 VIEW

[chest pa]
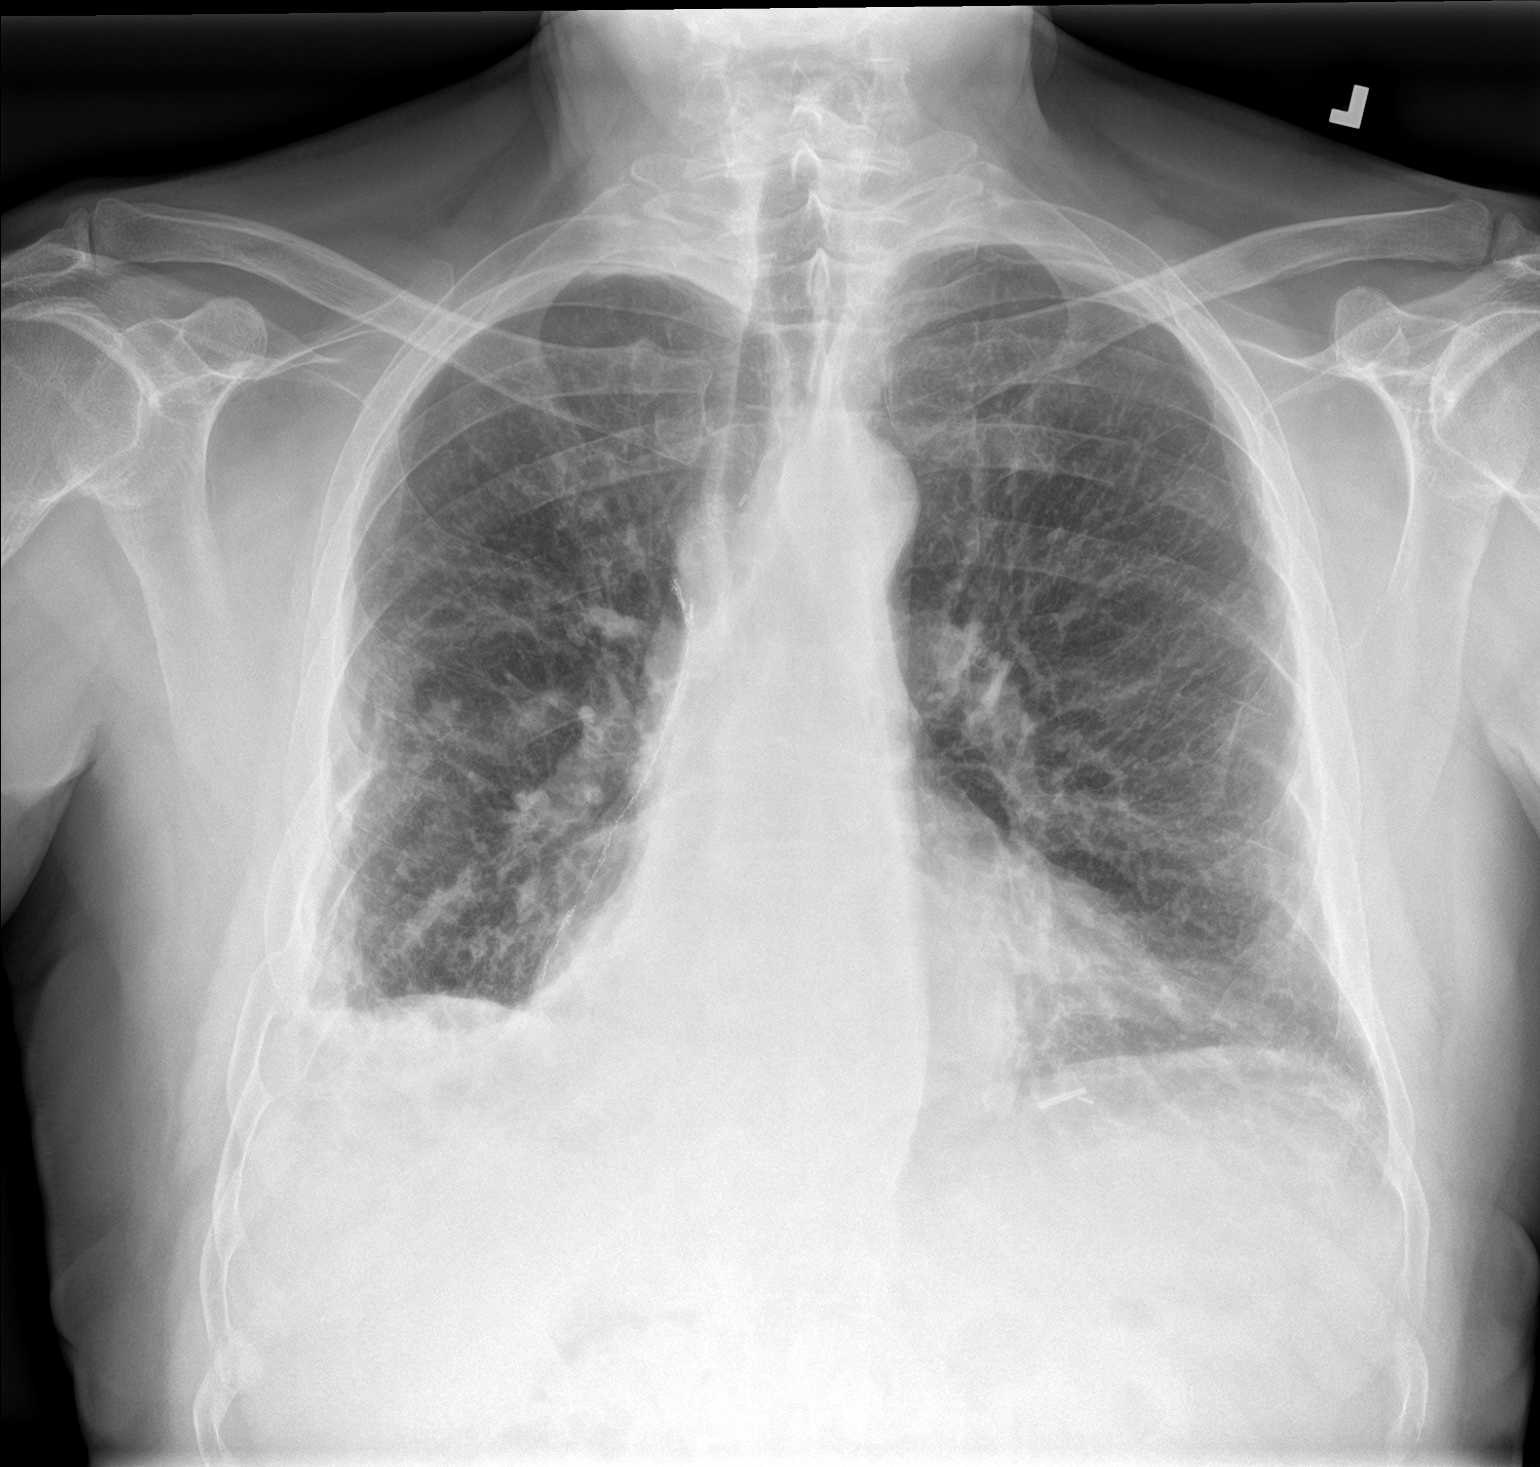

[chest lat]
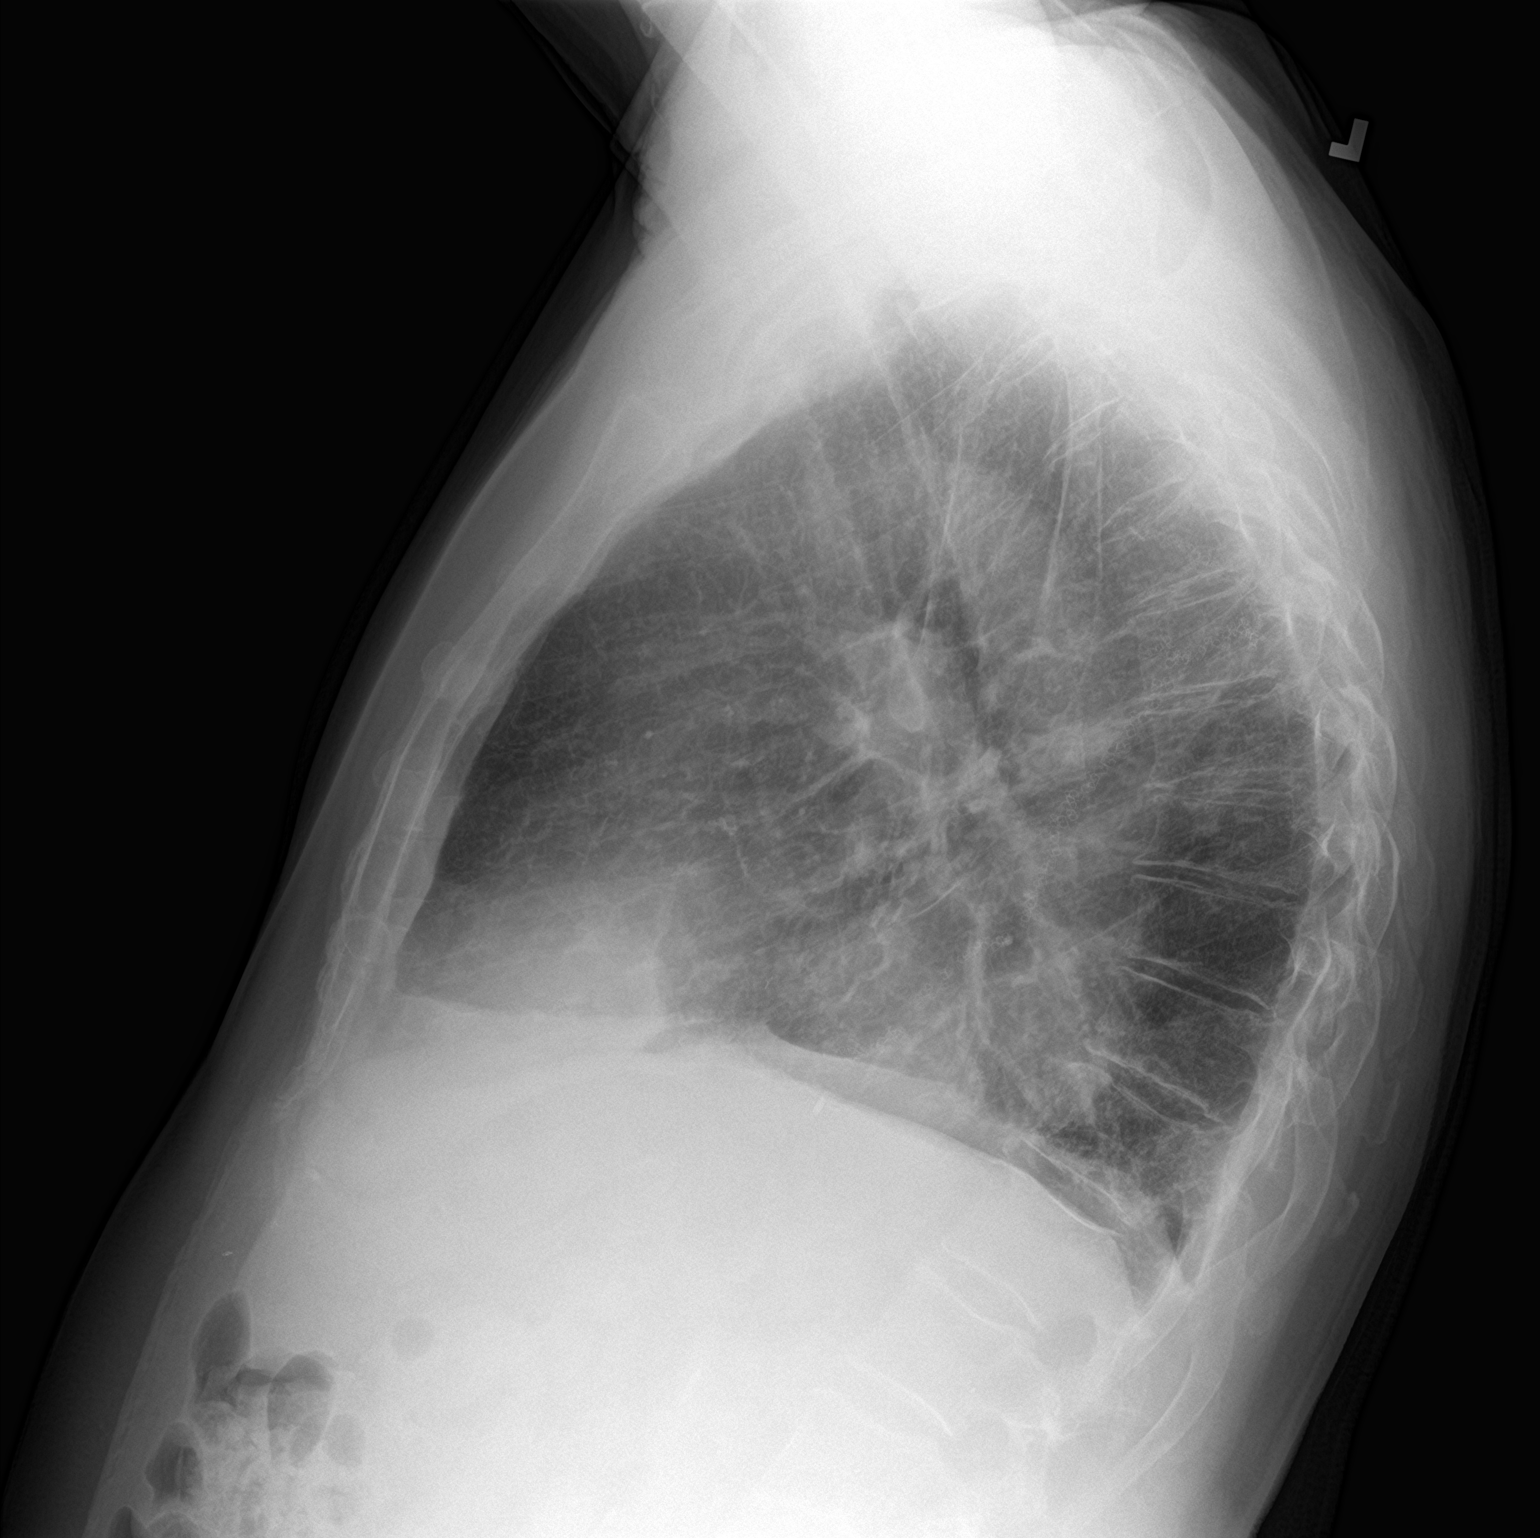

[2 of 2 positions shown; findings below may reference images not displayed]

FINDINGS: Focal area of apparent discontinuity of the posterolateral aspect of
a right lower rib may be artifactual or represent and acute
fracture. Correlation with clinical exam and point tenderness
recommended. Several old healed right rib fractures noted. Density
along the lateral aspect of the right pleural surface may represent
atelectasis/scarring or small right pleural effusion or hematoma
secondary to rib fractures. There are bibasilar interstitial
coarsening which may represent atelectasis/scarring or developing
infiltrate. No lobar consolidation or pneumothorax. Stable cardiac
silhouette. Postsurgical changes of the right cardiac border.
Degenerative changes of the spine.
IMPRESSION: 1. Artifact versus a possible acute right rib fractures with
probable adjacent pleuroparenchymal contusion. No large
pneumothorax.
2. Bibasilar atelectasis/scarring versus developing infiltrate.

## 2018-11-03 MED ORDER — AMOXICILLIN-POT CLAVULANATE 875-125 MG PO TABS
1.0000 | ORAL_TABLET | Freq: Once | ORAL | Status: AC
  Administered 2018-11-03: 1 via ORAL
  Filled 2018-11-03: qty 1

## 2018-11-03 MED ORDER — AMOXICILLIN-POT CLAVULANATE 875-125 MG PO TABS: 1.0000 | ORAL_TABLET | Freq: Two times a day (BID) | ORAL | 0 refills | Status: DC

## 2018-11-03 MED ORDER — DEXAMETHASONE 4 MG PO TABS
10.0000 mg | ORAL_TABLET | Freq: Once | ORAL | Status: AC
  Administered 2018-11-03: 10 mg via ORAL
  Filled 2018-11-03: qty 3

## 2018-11-03 MED ORDER — ALBUTEROL SULFATE HFA 108 (90 BASE) MCG/ACT IN AERS
2.0000 | INHALATION_SPRAY | Freq: Once | RESPIRATORY_TRACT | Status: AC
  Administered 2018-11-03: 2 via RESPIRATORY_TRACT
  Filled 2018-11-03: qty 6.7

## 2018-11-03 MED ORDER — DEXAMETHASONE 6 MG PO TABS: 6.0000 mg | ORAL_TABLET | Freq: Every day | ORAL | 0 refills | Status: DC

## 2018-11-03 NOTE — ED Notes (Signed)
Ambulated pt 02 started out at 97% full lap around nursing station,02 ended at 97%,patiet steady on feet

## 2018-11-03 NOTE — ED Provider Notes (Signed)
[facility] EMERGENCY DEPARTMENT Provider Note   CSN: [place] Wheeler:[id number] Arrival date & time: 11/02/18  2021    History   Chief Complaint Chief Complaint  Patient presents with   Cough    HPI James Wolf is a 68 y.o. male.   The history is provided by the patient.  He has history of hypertension, hyperlipidemia, COPD, esophageal surgery for esophageal cancer and frequent, aspiration pneumonia.  He comes in because of an episode of aspiration which occurred 2 days ago.  He had recurrent episodes of aspiration yesterday and today.  He has been short of breath and has run low-grade fevers as high as 100.3.  He denies chills or sweats.  There has been some dyspnea.  Has a nonproductive cough with episodes of posttussive emesis.  He denies exposure to COVID-19 and denies change in sense of smell or taste.  There has been no diarrhea and no arthralgias or myalgias.  He went to an urgent care center where they were concerned about an area on his chest x-ray and was advised to come to the emergency department.  Past Medical History:  Diagnosis Date   Anemia    Aspiration pneumonia ([place])    Cancer ([place])    esophageal cancer 1995   COPD (chronic obstructive pulmonary disease) ([place])    GERD (gastroesophageal reflux disease)    Hyperlipidemia    Hypertension    Osteoporosis     Patient Active Problem List   Diagnosis Date Noted   Hypertension 07/19/2017   COPD (chronic obstructive pulmonary disease) ([place]) 07/19/2017   History of esophageal cancer 07/19/2017   Acute respiratory failure with hypoxia ([place]) 07/19/2017   Aspiration pneumonitis ([place]) 07/19/2017   Syncope 07/19/2017    Past Surgical History:  Procedure Laterality Date   partial esophageal ejectory          Home Medications    Prior to Admission medications   Medication Sig Start Date End Date Taking? Authorizing Provider  B Complex Vitamins (VITAMIN B COMPLEX PO) Take 1 tablet by mouth 2 (two)  times daily. 03/10/07   [provider]  diclofenac sodium (VOLTAREN) 1 % GEL 4 GRAM ON SKIN 4 TIMES A DAY AS NEEDED 09/24/15   [provider]  esomeprazole (NEXIUM) 40 MG capsule Take 1 capsule by mouth 2 (two) times daily. 05/26/17   [provider]  sucralfate (CARAFATE) 1 GM/10ML suspension Take 10 mLs (1 g total) by mouth 4 (four) times daily -  with meals and at bedtime. 07/21/17   Manuella Ghazi, Pratik D, DO  telmisartan (MICARDIS) 20 MG tablet Take 1 tablet by mouth daily. 06/30/17   [provider]  Vitamin D, Ergocalciferol, (DRISDOL) 50000 units CAPS capsule Take by mouth.    [provider]    Family History No family history on file.  Social History Social History   Tobacco Use   Smoking status: Former Smoker    Quit date: 02/19/1993    Years since quitting: 25.7   Smokeless tobacco: Never Used  Substance Use Topics   Alcohol use: Not Currently   Drug use: Never     Allergies   Zithromax [azithromycin]   Review of Systems Review of Systems  All other systems reviewed and are negative.    Physical Exam Updated Vital Signs BP 105/76    Pulse (!) 111    Temp 98.2 F (36.8 C)    Resp (!) 21    Ht 5\' 5"  (1.651 m)  Wt 78.9 kg    SpO2 96%    BMI 28.96 kg/m   Physical Exam Vitals signs and nursing note reviewed.    68 year old male, mildly dyspneic, but in no acute distress. Vital signs are significant for mildly elevated respiratory rate and heart rate. Oxygen saturation is 96%, which is normal. Head is normocephalic and atraumatic. PERRLA, EOMI. Oropharynx is clear. Neck is nontender and supple without adenopathy or JVD. Back is nontender and there is no CVA tenderness. Lungs have mild wheezing which is worse on the right.  There are no rales or rhonchi. Chest is nontender. Heart has regular rate and rhythm without murmur. Abdomen is soft, flat, nontender without masses or hepatosplenomegaly and peristalsis is  normoactive. Extremities have no cyanosis or edema, full range of motion is present. Skin is warm and dry without rash. Neurologic: Mental status is normal, cranial nerves are intact, there are no motor or sensory deficits.  ED Treatments / Results  Labs (all labs ordered are listed, but only abnormal results are displayed) Labs Reviewed  CBC WITH DIFFERENTIAL/PLATELET - Abnormal; Notable for the following components:      Result Value   WBC 15.3 (*)    Neutro Abs 12.6 (*)    Monocytes Absolute 1.2 (*)    All other components within normal limits  BASIC METABOLIC PANEL - Abnormal; Notable for the following components:   Glucose, Bld 134 (*)    Creatinine, Ser 1.32 (*)    GFR calc non Af Amer 55 (*)    All other components within normal limits   Radiology Dg Chest 2 View  Result Date: 11/03/2018 CLINICAL DATA:  68 year old male with cough. EXAM: CHEST - 2 VIEW COMPARISON:  Chest radiograph dated 03/21/2009 FINDINGS: Focal area of apparent discontinuity of the posterolateral aspect of a right lower rib may be artifactual or represent and acute fracture. Correlation with clinical exam and point tenderness recommended. Several old healed right rib fractures noted. Density along the lateral aspect of the right pleural surface may represent atelectasis/scarring or small right pleural effusion or hematoma secondary to rib fractures. There are bibasilar interstitial coarsening which may represent atelectasis/scarring or developing infiltrate. No lobar consolidation or pneumothorax. Stable cardiac silhouette. Postsurgical changes of the right cardiac border. Degenerative changes of the spine. IMPRESSION: 1. Artifact versus a possible acute right rib fractures with probable adjacent pleuroparenchymal contusion. No large pneumothorax. 2. Bibasilar atelectasis/scarring versus developing infiltrate. Electronically Signed   By: Anner Crete M.D.   On: 11/03/2018 00:48    Procedures Procedures    Medications Ordered in ED Medications  amoxicillin-clavulanate (AUGMENTIN) 875-125 MG per tablet 1 tablet (has no administration in time range)  albuterol (VENTOLIN HFA) 108 (90 Base) MCG/ACT inhaler 2 puff (2 puffs Inhalation Given 11/03/18 0104)  dexamethasone (DECADRON) tablet 10 mg (10 mg Oral Given 11/03/18 0144)     Initial Impression / Assessment and Plan / ED Course  I have reviewed the triage vital signs and the nursing notes.  Pertinent labs & imaging results that were available during my care of the patient were reviewed by me and considered in my medical decision making (see chart for details).  Probable aspiration pneumonia.  Old records are reviewed, and he did have a CT of his chest in 2019 showing some chronic scarring and atelectasis of the right base.  X-ray from urgent care was reviewed and apparently there were concerned about what appears to be a right pleural effusion.  This actually just appears  to be known chronic scarring.  However, there is some concern about developing infiltrate.  He will need to be placed on antibiotics.  We will also test for COVID-19, although this is somewhat unlikely since he has been very careful about isolation from others and the clinical course is typical of his prior episodes of aspiration.  Labs do show mild leukocytosis, mild renal insufficiency, mildly elevated glucose.  None of these is significantly changed from baseline.  Old records reviewed confirming prior hospitalizations for aspiration pneumonia.  Currently, patient does not appear sick enough to need hospitalization.  He will be given albuterol for his wheezing and started on dexamethasone which would treat COPD exacerbation and also be helpful for COVID-19.  He feels somewhat better following albuterol.  He was ambulated in the emergency department and did very well with no oxygen desaturation.  KOLBE MIAH was evaluated in Emergency Department on 11/03/2018 for the  symptoms described in the history of present illness. He was evaluated in the context of the global COVID-19 pandemic, which necessitated consideration that the patient might be at risk for infection with the SARS-CoV-2 virus that causes COVID-19. Institutional protocols and algorithms that pertain to the evaluation of patients at risk for COVID-19 are in a state of rapid change based on information released by regulatory bodies including the CDC and federal and state organizations. These policies and algorithms were followed during the patient's care in the ED.  Final Clinical Impressions(s) / ED Diagnoses   Final diagnoses:  Community acquired pneumonia of right lower lobe of lung    ED Discharge Orders         Ordered    amoxicillin-clavulanate (AUGMENTIN) 875-125 MG tablet  2 times daily     11/03/18 0221    dexamethasone (DECADRON) 6 MG tablet  Daily     11/03/18 [id number]           Delora Fuel, MD [id number] [phone]

## 2018-11-03 NOTE — Discharge Instructions (Addendum)
"  Use the inhaler as needed.  Return if shortness of breath or fever are getting worse.

## 2018-11-29 ENCOUNTER — Emergency Department (HOSPITAL_COMMUNITY): Payer: BC Managed Care – PPO

## 2018-11-29 ENCOUNTER — Other Ambulatory Visit: Payer: Self-pay

## 2018-11-29 ENCOUNTER — Encounter (HOSPITAL_COMMUNITY): Payer: Self-pay | Admitting: Emergency Medicine

## 2018-11-29 ENCOUNTER — Emergency Department (HOSPITAL_COMMUNITY)
Admission: EM | Admit: 2018-11-29 | Discharge: 2018-11-29 | Disposition: A | Discharge status: Home / Self Care | Payer: BC Managed Care – PPO | Attending: Emergency Medicine | Admitting: Emergency Medicine

## 2018-11-29 DIAGNOSIS — J449 Chronic obstructive pulmonary disease, unspecified: Secondary | ICD-10-CM | POA: Diagnosis not present

## 2018-11-29 DIAGNOSIS — Z8501 Personal history of malignant neoplasm of esophagus: Secondary | ICD-10-CM | POA: Diagnosis not present

## 2018-11-29 DIAGNOSIS — I10 Essential (primary) hypertension: Secondary | ICD-10-CM | POA: Diagnosis not present

## 2018-11-29 DIAGNOSIS — R0602 Shortness of breath: Secondary | ICD-10-CM | POA: Insufficient documentation

## 2018-11-29 DIAGNOSIS — Z87891 Personal history of nicotine dependence: Secondary | ICD-10-CM | POA: Diagnosis not present

## 2018-11-29 DIAGNOSIS — R05 Cough: Secondary | ICD-10-CM | POA: Diagnosis not present

## 2018-11-29 LAB — CBC WITH DIFFERENTIAL/PLATELET
Abs Immature Granulocytes: 0.05 10*3/uL (ref 0.00–0.07)
Basophils Absolute: 0 10*3/uL (ref 0.0–0.1)
Basophils Relative: 0 %
Eosinophils Absolute: 0 10*3/uL (ref 0.0–0.5)
Eosinophils Relative: 0 %
HCT: 47 % (ref 39.0–52.0)
Hemoglobin: 14.8 g/dL (ref 13.0–17.0)
Immature Granulocytes: 0 %
Lymphocytes Relative: 5 %
Lymphs Abs: 0.7 10*3/uL (ref 0.7–4.0)
MCH: 28.8 pg (ref 26.0–34.0)
MCHC: 31.5 g/dL (ref 30.0–36.0)
MCV: 91.6 fL (ref 80.0–100.0)
Monocytes Absolute: 1 10*3/uL (ref 0.1–1.0)
Monocytes Relative: 8 %
Neutro Abs: 11.5 10*3/uL — ABNORMAL HIGH (ref 1.7–7.7)
Neutrophils Relative %: 87 %
Platelets: 278 10*3/uL (ref 150–400)
RBC: 5.13 MIL/uL (ref 4.22–5.81)
RDW: 14 % (ref 11.5–15.5)
WBC: 13.3 10*3/uL — ABNORMAL HIGH (ref 4.0–10.5)
nRBC: 0 % (ref 0.0–0.2)

## 2018-11-29 LAB — COMPREHENSIVE METABOLIC PANEL
ALT: 21 U/L (ref 0–44)
AST: 21 U/L (ref 15–41)
Albumin: 3.9 g/dL (ref 3.5–5.0)
Alkaline Phosphatase: 49 U/L (ref 38–126)
Anion gap: 9 (ref 5–15)
BUN: 13 mg/dL (ref 8–23)
CO2: 23 mmol/L (ref 22–32)
Calcium: 8.8 mg/dL — ABNORMAL LOW (ref 8.9–10.3)
Chloride: 104 mmol/L (ref 98–111)
Creatinine, Ser: 1.31 mg/dL — ABNORMAL HIGH (ref 0.61–1.24)
GFR calc Af Amer: >60 mL/min (ref >60)
GFR calc non Af Amer: 56 mL/min — ABNORMAL LOW (ref >60)
Glucose, Bld: 162 mg/dL — ABNORMAL HIGH (ref 70–99)
Potassium: 3.8 mmol/L (ref 3.5–5.1)
Sodium: 136 mmol/L (ref 135–145)
Total Bilirubin: 0.9 mg/dL (ref 0.3–1.2)
Total Protein: 7.9 g/dL (ref 6.5–8.1)

## 2018-11-29 IMAGING — DX DG CHEST 1V PORT
1 series · 1 of 1 positions shown · non-contrast
Comparison: [DATE]

CLINICAL DATA: Pt states he aspirated this AM about [I9]. Pt was
seen in [HOSPITAL] in [HOSPITAL] and was sent here. Pt c/o SOB and a
cough since it happened.

EXAM:
PORTABLE CHEST 1 VIEW

[chest ap]
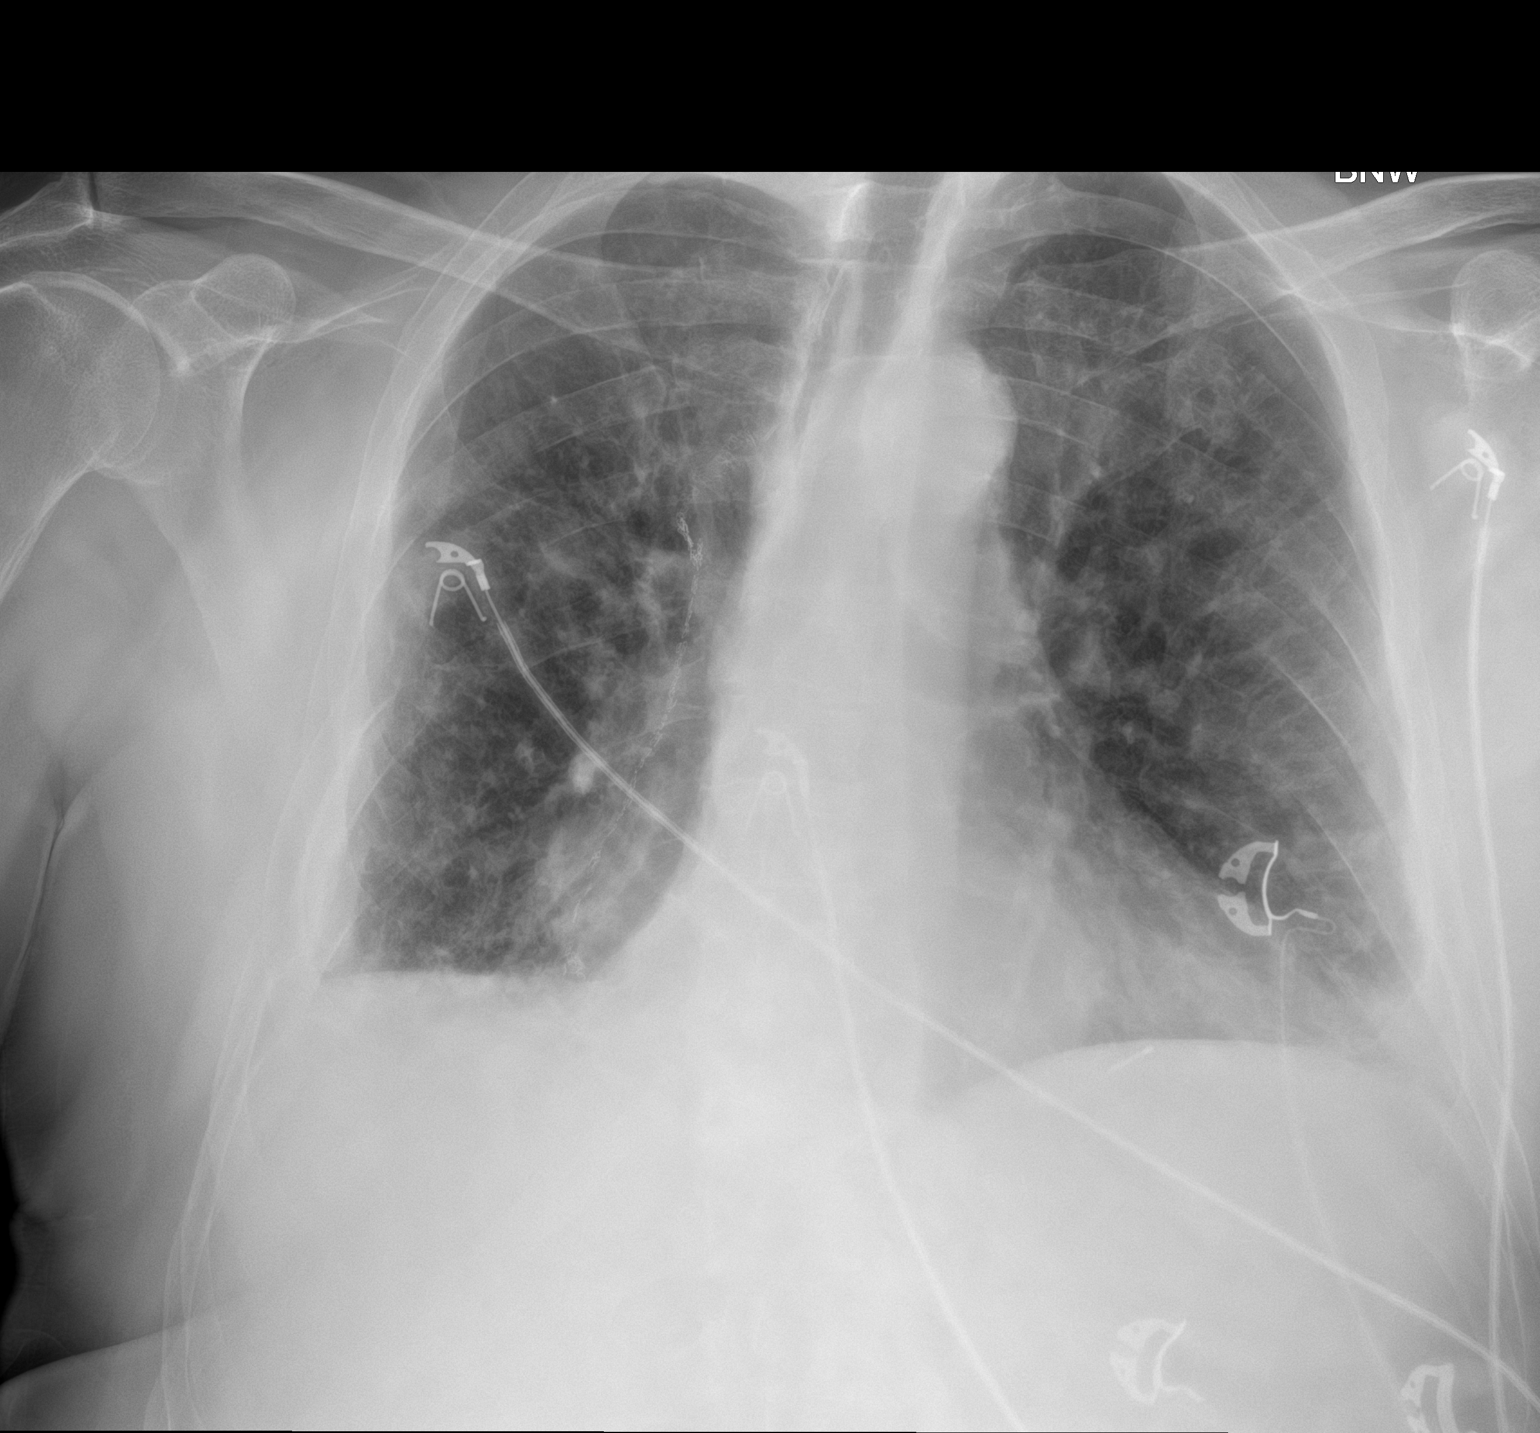

[1 of 1 positions shown; findings below may reference images not displayed]

FINDINGS: Cardiac silhouette is normal in size. No mediastinal or hilar
masses.

Left lateral lung base hazy opacity. Mild medial right lung base
opacity. There are prominent bronchovascular markings bilaterally,
stable. There stable pulmonary anastomosis staples extending from
the right hilum towards the right medial lung base.

No convincing pleural effusion.  No pneumothorax.

Old right-sided rib fractures.
IMPRESSION: 1. Mild opacity noted at the lung bases which may reflect
atelectasis. And aspiration pneumonitis is possible as is infection.
2. No other evidence of acute cardiopulmonary disease.

## 2018-11-29 MED ORDER — AEROCHAMBER PLUS FLO-VU MISC
1.0000 | Freq: Once | Status: AC
  Administered 2018-11-29: 1
  Filled 2018-11-29: qty 1

## 2018-11-29 MED ORDER — FAMOTIDINE IN NACL 20-0.9 MG/50ML-% IV SOLN
20.0000 mg | Freq: Once | INTRAVENOUS | Status: AC
  Administered 2018-11-29: 20:00:00 20 mg via INTRAVENOUS
  Filled 2018-11-29: qty 50

## 2018-11-29 MED ORDER — AMOXICILLIN-POT CLAVULANATE 875-125 MG PO TABS: 1.0000 | ORAL_TABLET | Freq: Two times a day (BID) | ORAL | 0 refills | Status: DC

## 2018-11-29 MED ORDER — METHYLPREDNISOLONE 4 MG PO TBPK: ORAL_TABLET | ORAL | 0 refills | Status: DC

## 2018-11-29 MED ORDER — SODIUM CHLORIDE 0.9 % IV BOLUS
1000.0000 mL | Freq: Once | INTRAVENOUS | Status: AC
  Administered 2018-11-29: 1000 mL via INTRAVENOUS

## 2018-11-29 MED ORDER — ALBUTEROL SULFATE HFA 108 (90 BASE) MCG/ACT IN AERS
4.0000 | INHALATION_SPRAY | Freq: Once | RESPIRATORY_TRACT | Status: AC
  Administered 2018-11-29: 4 via RESPIRATORY_TRACT
  Filled 2018-11-29: qty 6.7

## 2018-11-29 MED ORDER — DEXAMETHASONE SODIUM PHOSPHATE 10 MG/ML IJ SOLN
10.0000 mg | Freq: Once | INTRAMUSCULAR | Status: AC
  Administered 2018-11-29: 10 mg via INTRAVENOUS
  Filled 2018-11-29: qty 1

## 2018-11-29 NOTE — ED Triage Notes (Signed)
Pt states he aspirated this AM about 0420. Pt was seen in Urgent Care in [place] and was sent here. Pt c/o SOB and a cough since it happened.

## 2018-11-29 NOTE — Discharge Instructions (Addendum)
Get help right away if: You have worsening shortness of breath, even when resting. You have trouble talking. You have severe chest pain. You cough up blood. You have a fever. You have weakness, vomit repeatedly, or faint. You feel confused. You are not able to sleep because of your symptoms. You have trouble doing daily activities.

## 2018-11-29 NOTE — ED Provider Notes (Signed)
[facility] EMERGENCY DEPARTMENT Provider Note   CSN: SK:[id number] Arrival date & time: 11/29/18  1859     History   Chief Complaint Chief Complaint  Patient presents with   Aspiration    HPI James Wolf is a 68 y.o. male with a pmh of hypertension, hyperlipidemia, COPD, and previous espophagectomy for esophageal cancer with recurrent episodes aspiration pneumonia.  The patient states taht around 4:00AM he had an episodes of aspiration. Since that time he has had coughing, sob, post tussive vomiting, reflux and wheezing. His wife tried percussive massage at home without relief. He did not use his inhaler. He denies fever or chills.     HPI  Past Medical History:  Diagnosis Date   Anemia    Aspiration pneumonia ([place])    Cancer ([place])    esophageal cancer 1995   COPD (chronic obstructive pulmonary disease) ([place])    GERD (gastroesophageal reflux disease)    Hyperlipidemia    Hypertension    Osteoporosis     Patient Active Problem List   Diagnosis Date Noted   Hypertension 07/19/2017   COPD (chronic obstructive pulmonary disease) ([place]) 07/19/2017   History of esophageal cancer 07/19/2017   Acute respiratory failure with hypoxia ([place]) 07/19/2017   Aspiration pneumonitis ([place]) 07/19/2017   Syncope 07/19/2017    Past Surgical History:  Procedure Laterality Date   partial esophageal ejectory          Home Medications    Prior to Admission medications   Medication Sig Start Date End Date Taking? Authorizing Provider  amoxicillin-clavulanate (AUGMENTIN) 875-125 MG tablet Take 1 tablet by mouth 2 (two) times daily. One po bid x 7 days [id number]   Delora Fuel, MD  B Complex Vitamins (VITAMIN B COMPLEX PO) Take 1 tablet by mouth 2 (two) times daily. 03/10/07   [provider]  dexamethasone (DECADRON) 6 MG tablet Take 1 tablet (6 mg total) by mouth daily. [id number]   Delora Fuel, MD  diclofenac sodium (VOLTAREN) 1 % GEL 4 GRAM ON SKIN 4 TIMES A  DAY AS NEEDED 09/24/15   [provider]  esomeprazole (NEXIUM) 40 MG capsule Take 1 capsule by mouth 2 (two) times daily. 05/26/17   [provider]  sucralfate (CARAFATE) 1 GM/10ML suspension Take 10 mLs (1 g total) by mouth 4 (four) times daily -  with meals and at bedtime. 07/21/17   Manuella Ghazi, Pratik D, DO  telmisartan (MICARDIS) 20 MG tablet Take 1 tablet by mouth daily. 06/30/17   [provider]  Vitamin D, Ergocalciferol, (DRISDOL) 50000 units CAPS capsule Take by mouth.    [provider]    Family History No family history on file.  Social History Social History   Tobacco Use   Smoking status: Former Smoker    Quit date: 02/19/1993    Years since quitting: 25.7   Smokeless tobacco: Never Used  Substance Use Topics   Alcohol use: Not Currently    Comment: occ   Drug use: Never     Allergies   Zithromax [azithromycin]   Review of Systems Review of Systems  Ten systems reviewed and are negative for acute change, except as noted in the HPI.   Physical Exam Updated Vital Signs BP 108/81    Pulse (!) 129    Temp 99.4 F (37.4 C)    Resp 18    Ht 5' 5.5" (1.664 m)    Wt 77.1 kg    SpO2 98%    BMI 27.86  kg/m   Physical Exam Vitals signs and nursing note reviewed.  Constitutional:      General: He is not in acute distress.    Appearance: He is well-developed. He is not diaphoretic.  HENT:     Head: Normocephalic and atraumatic.  Eyes:     General: No scleral icterus.    Conjunctiva/sclera: Conjunctivae normal.  Neck:     Musculoskeletal: Normal range of motion and neck supple.  Cardiovascular:     Rate and Rhythm: Normal rate and regular rhythm.     Heart sounds: Normal heart sounds.  Pulmonary:     Effort: No respiratory distress.     Breath sounds: Wheezing and rhonchi present.     Comments: Dyspneic, winded speech Abdominal:     Palpations: Abdomen is soft.     Tenderness: There is no abdominal tenderness.  Skin:     General: Skin is warm and dry.  Neurological:     Mental Status: He is alert.  Psychiatric:        Behavior: Behavior normal.      ED Treatments / Results  Labs (all labs ordered are listed, but only abnormal results are displayed) Labs Reviewed  CBC WITH DIFFERENTIAL/PLATELET  COMPREHENSIVE METABOLIC PANEL    EKG EKG Interpretation  Date/Time:  Sunday November 29 2018 19:13:20 EST Ventricular Rate:  126 PR Interval:    QRS Duration: 92 QT Interval:  299 QTC Calculation: 433 R Axis:   -126 Text Interpretation: Sinus tachycardia Markedly posterior QRS axis Baseline wander in lead(s) V2 V3 Confirmed by Gerlene Fee [phone]) on 11/29/2018 7:27:23 PM   Radiology No results found.  Procedures Procedures (including critical care time)  Medications Ordered in ED Medications  albuterol (VENTOLIN HFA) 108 (90 Base) MCG/ACT inhaler 4 puff (has no administration in time range)  aerochamber plus with mask device 1 each (has no administration in time range)  sodium chloride 0.9 % bolus 1,000 mL (has no administration in time range)  dexamethasone (DECADRON) injection 10 mg (has no administration in time range)  famotidine (PEPCID) IVPB 20 mg premix (has no administration in time range)     Initial Impression / Assessment and Plan / ED Course  I have reviewed the triage vital signs and the nursing notes.  Pertinent labs & imaging results that were available during my care of the patient were reviewed by me and considered in my medical decision making (see chart for details).  Clinical Course as of Nov 28 2209  Nancy Fetter Nov 29, 2018  2007 Glucose(!): 162 [AH]  2008 WBC(!): 13.3 [AH]    Clinical Course User Index [AH] Margarita Mail, PA-C       CC: Cough and shortness of breath VS:  Vitals:   11/29/18 2030 11/29/18 2100 11/29/18 2115 11/29/18 2130  BP: 110/74 105/80  (!) 119/94  Pulse: (!) 116 (!) 113 (!) 124 (!) 124  Resp:  16 (!) 29 18  Temp:      SpO2: 94% 97% 94%  95%  Weight:      Height:        [id number] is gathered by patient, EMR, and wife who is at bedside. DDX:The emergent differential diagnosis for shortness of breath includes, but is not limited to, Pulmonary edema, bronchoconstriction, Pneumonia, Pulmonary embolism, Pneumotherax/ Hemothorax, Dysrythmia, ACS.  Labs: I reviewed the labs which show slightly elevated white white blood cell count she appears chronic in nature.  Elevated blood glucose.  Creatinine is slightly above baseline. Imaging: I personally reviewed  the images (1 view chest x-ray) which show(s) atelectasis versus developing infiltrate on my interpretation EKG: Sinus tachycardia MDM: Patient here with shortness of breath, complaint of aspiration.  Patient treated as a COPD D exacerbation with significant improvement in his symptoms.  Patient will be discharged with Medrol Dosepak.  He is given a spacer and advised close follow-up with outpatient PCP.  Patient is also advised to hold an antibiotic at this time unless he develops fever or worsening symptoms.  He then may then begin the antibiotics at that point.  Patient feels greatly improved and appears appropriate for discharge at this time Patient disposition: Discharge Patient condition: Good. The patient appears reasonably screened and/or stabilized for discharge and I doubt any other medical condition or other [facility] requiring further screening, evaluation, or treatment in the ED at this time prior to discharge. I have discussed lab and/or imaging findings with the patient and answered all questions/concerns to the best of my ability. I have discussed return precautions and OP follow up.      Final Clinical Impressions(s) / ED Diagnoses   Final diagnoses:  SOB (shortness of breath)    ED Discharge Orders    None       Margarita Mail, PA-C 11/29/18 2216    Maudie Flakes, MD 11/30/18 [phone]

## 2019-03-22 ENCOUNTER — Encounter (HOSPITAL_COMMUNITY): Payer: Self-pay

## 2019-03-22 ENCOUNTER — Emergency Department (HOSPITAL_COMMUNITY): Payer: BC Managed Care – PPO

## 2019-03-22 ENCOUNTER — Other Ambulatory Visit: Payer: Self-pay

## 2019-03-22 ENCOUNTER — Emergency Department (HOSPITAL_COMMUNITY)
Admission: EM | Admit: 2019-03-22 | Discharge: 2019-03-23 | Disposition: A | Discharge status: Home / Self Care | Payer: BC Managed Care – PPO | Attending: Emergency Medicine | Admitting: Emergency Medicine

## 2019-03-22 DIAGNOSIS — J449 Chronic obstructive pulmonary disease, unspecified: Secondary | ICD-10-CM | POA: Insufficient documentation

## 2019-03-22 DIAGNOSIS — N132 Hydronephrosis with renal and ureteral calculous obstruction: Secondary | ICD-10-CM | POA: Diagnosis not present

## 2019-03-22 DIAGNOSIS — Z79899 Other long term (current) drug therapy: Secondary | ICD-10-CM | POA: Insufficient documentation

## 2019-03-22 DIAGNOSIS — Z8501 Personal history of malignant neoplasm of esophagus: Secondary | ICD-10-CM | POA: Insufficient documentation

## 2019-03-22 DIAGNOSIS — R1030 Lower abdominal pain, unspecified: Secondary | ICD-10-CM | POA: Diagnosis present

## 2019-03-22 DIAGNOSIS — I10 Essential (primary) hypertension: Secondary | ICD-10-CM | POA: Diagnosis not present

## 2019-03-22 DIAGNOSIS — N23 Unspecified renal colic: Secondary | ICD-10-CM

## 2019-03-22 DIAGNOSIS — Z87891 Personal history of nicotine dependence: Secondary | ICD-10-CM | POA: Diagnosis not present

## 2019-03-22 LAB — URINALYSIS, ROUTINE W REFLEX MICROSCOPIC
Bacteria, UA: NONE SEEN
Bilirubin Urine: NEGATIVE
Glucose, UA: 150 mg/dL — AB
Ketones, ur: NEGATIVE mg/dL
Leukocytes,Ua: NEGATIVE
Nitrite: NEGATIVE
Protein, ur: NEGATIVE mg/dL
Specific Gravity, Urine: 1.015 (ref 1.005–1.030)
pH: 6 (ref 5.0–8.0)

## 2019-03-22 IMAGING — CT CT RENAL STONE PROTOCOL
2 of 4 series · 16 of 46 positions shown, 18 images · non-contrast
Comparison: Chest CT [DATE]

CLINICAL DATA: Left-sided flank pain

EXAM:
CT ABDOMEN AND PELVIS WITHOUT CONTRAST
TECHNIQUE: Multidetector CT imaging of the abdomen and pelvis was performed
following the standard protocol without IV contrast.

[Series 2: axial st · axial · 0.86mm/px · z∈[-558,-158]mm · 13 of 91 slices shown, 15 images]
[im 6/91  soft-tissue]
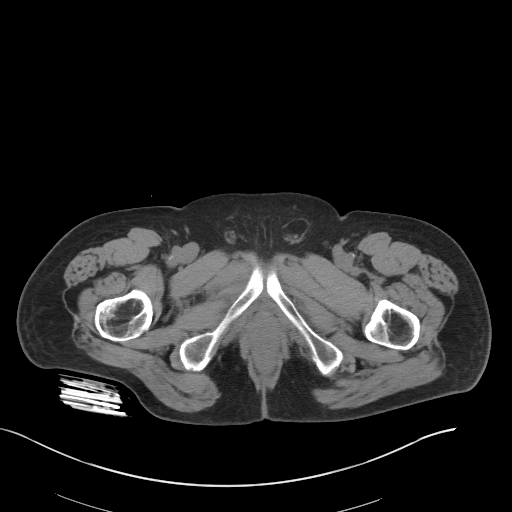
[im 6/91  bone]
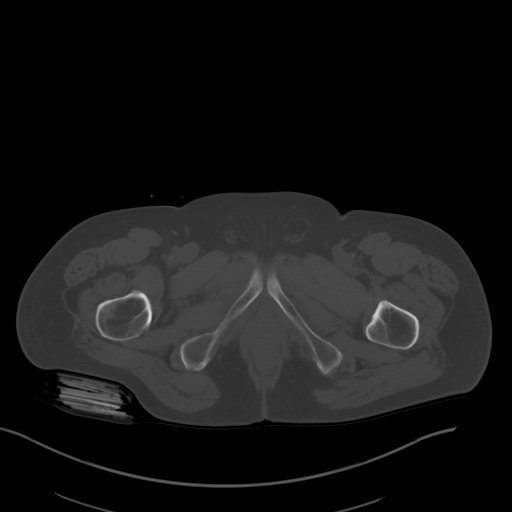
[im 11/91  soft-tissue]
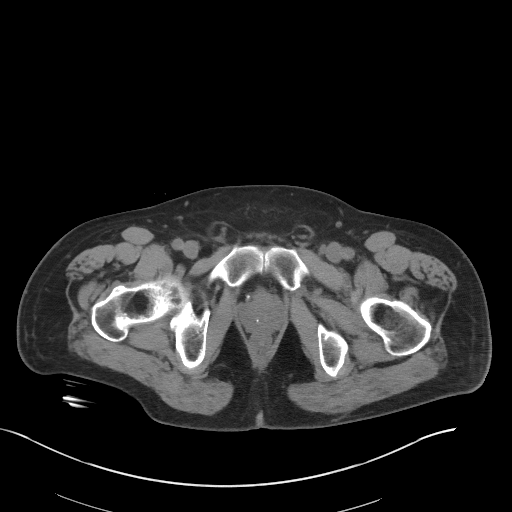
[im 21/91  soft-tissue]
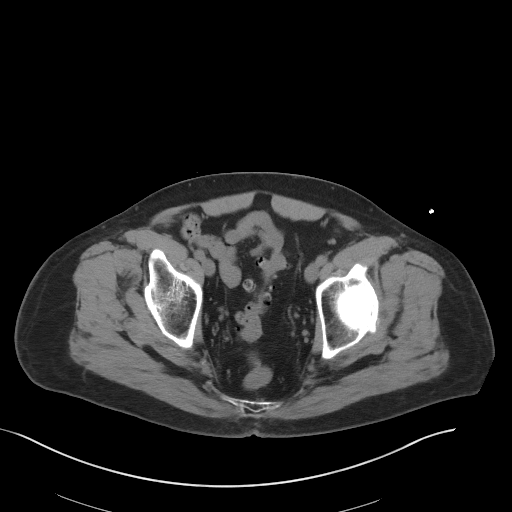
[im 26/91  soft-tissue]
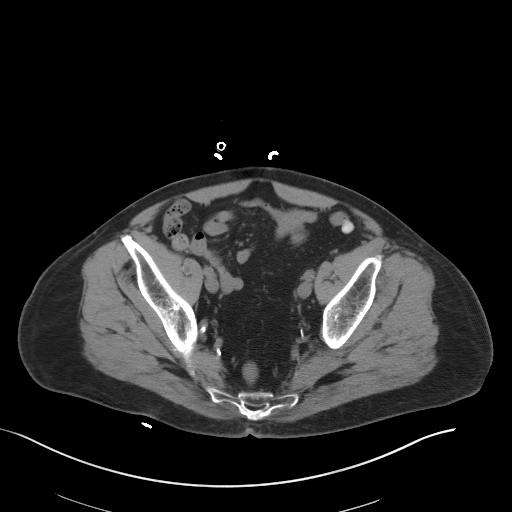
[im 31/91  soft-tissue]
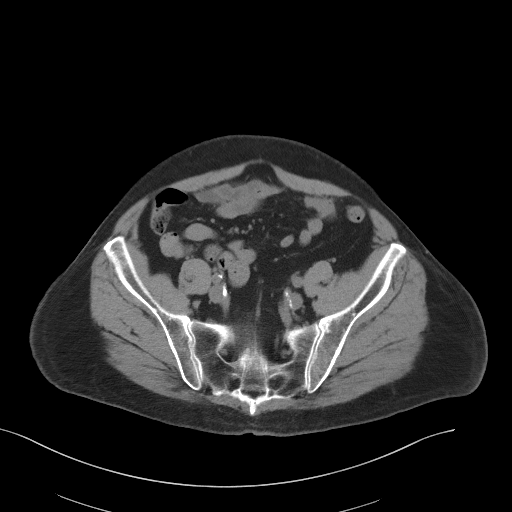
[im 41/91  soft-tissue]
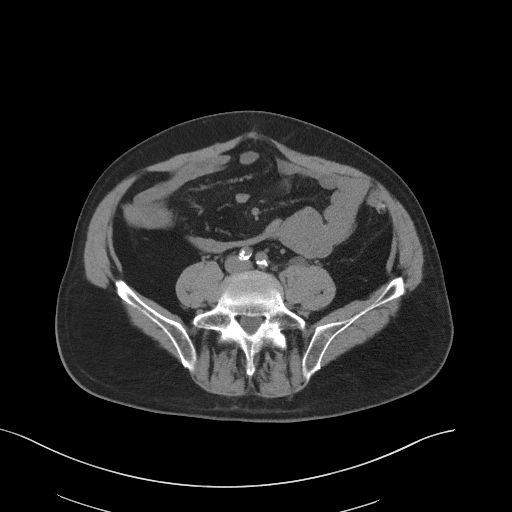
[im 46/91  soft-tissue]
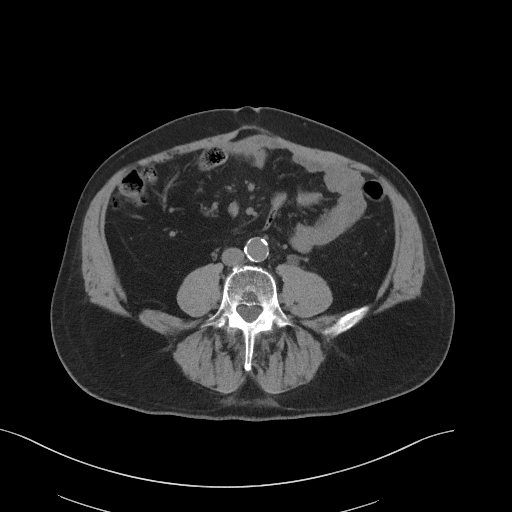
[im 51/91  soft-tissue]
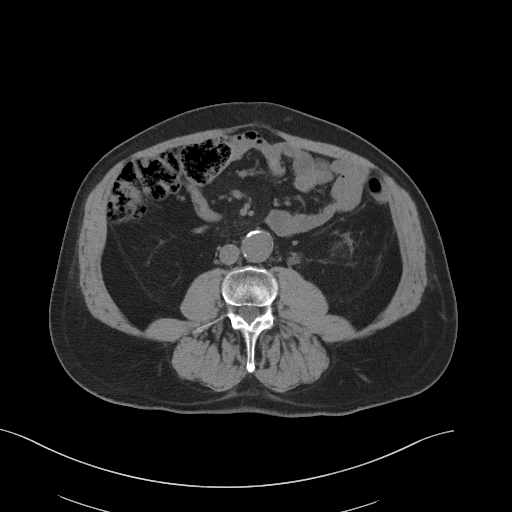
[im 61/91  soft-tissue]
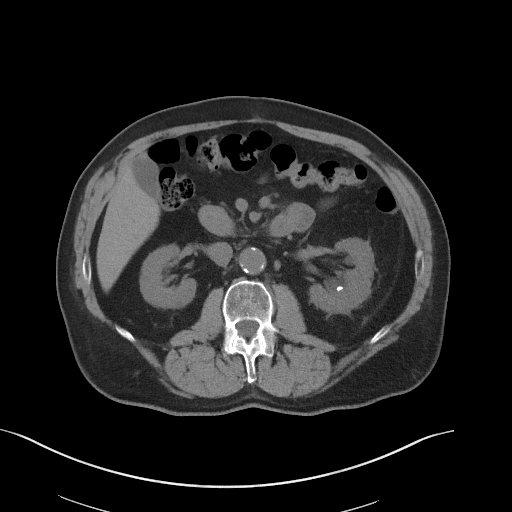
[im 61/91  bone]
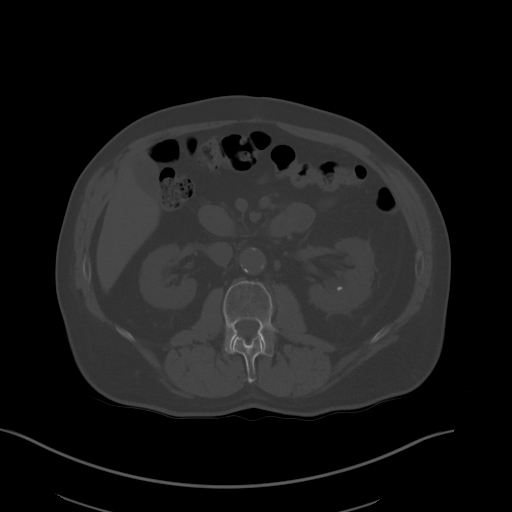
[im 66/91  soft-tissue]
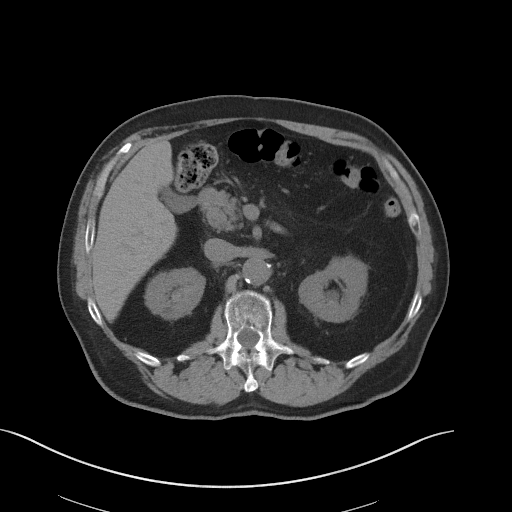
[im 71/91  soft-tissue]
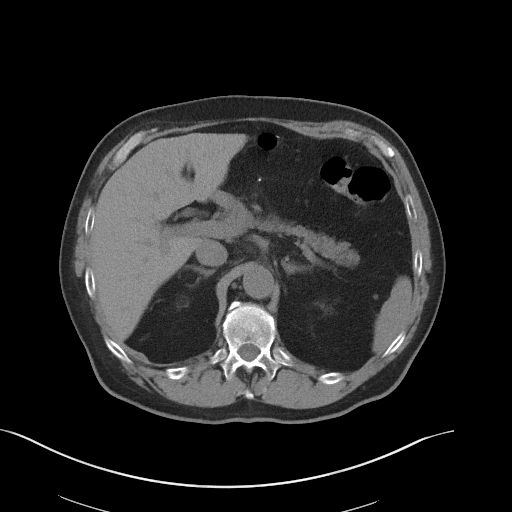
[im 81/91  soft-tissue]
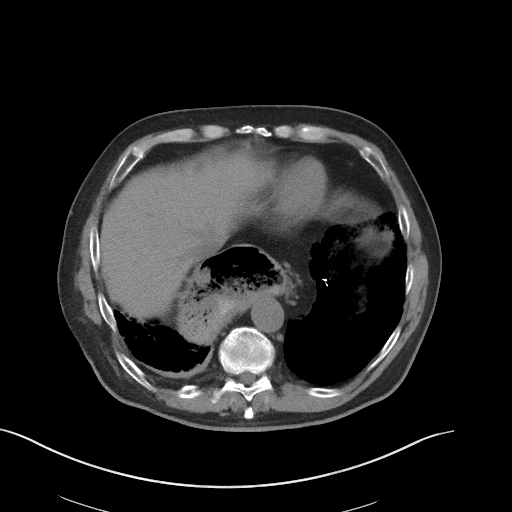
[im 86/91  soft-tissue]
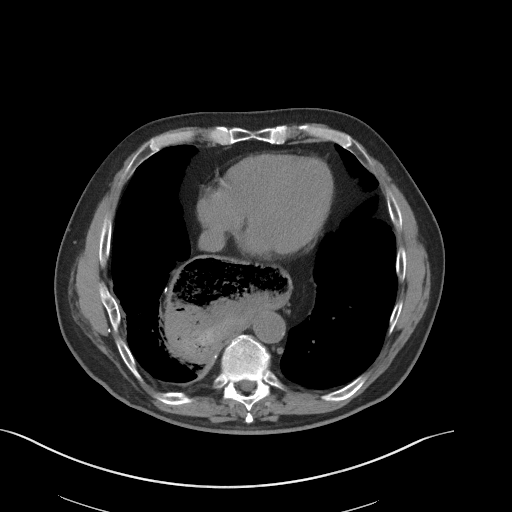

[Series 5: coronal st · coronal · 0.76mm/px · 3 of 91 slices shown]
[im 31/91  soft-tissue]
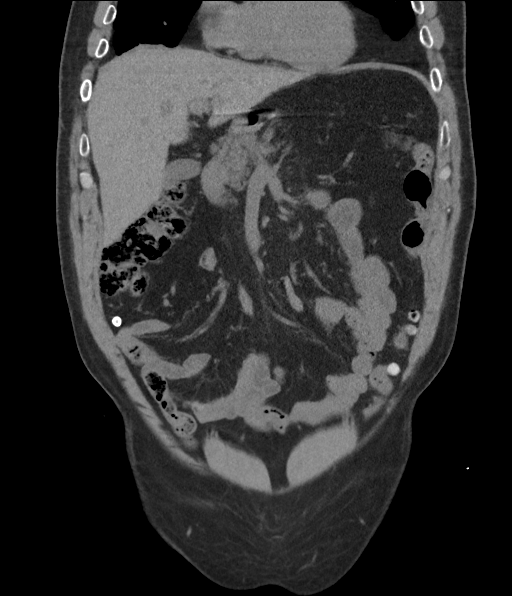
[im 41/91  soft-tissue]
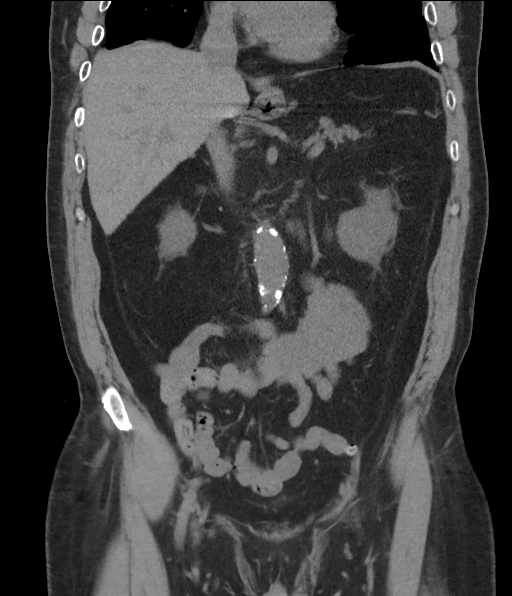
[im 51/91  soft-tissue]
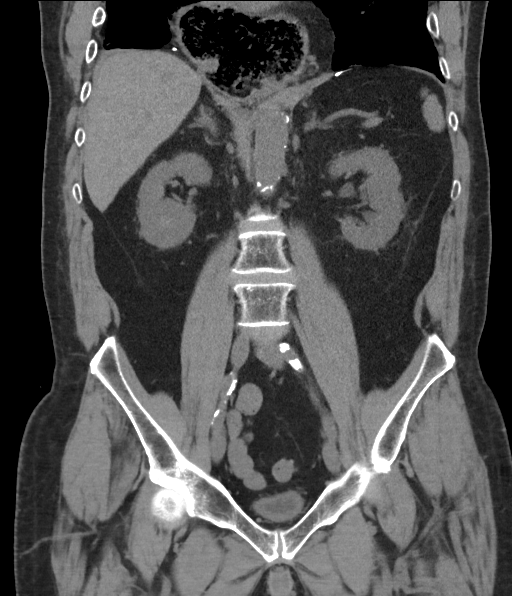

[16 of 46 positions shown; findings below may reference images not displayed]

FINDINGS: Lower chest: Lung bases demonstrate partially visualized gastric
pull-through with internal debris. Atelectasis in the right lower
lobe. Mild fibrosis at the lung bases. No acute consolidation or
effusion. Normal heart size.

Hepatobiliary: No focal liver abnormality is seen. No gallstones,
gallbladder wall thickening, or biliary dilatation.

Pancreas: Unremarkable. No pancreatic ductal dilatation or
surrounding inflammatory changes.

Spleen: Normal in size without focal abnormality.

Adrenals/Urinary Tract: Adrenal glands are normal. Intrarenal stones
bilaterally. On the right, stones measure up to 4 mm in size. On the
left, stones measure up to 5 mm in size. Mild left hydronephrosis
hydroureter, secondary to a 4 mm stone in the mid left ureter.
Bladder is nearly empty

Stomach/Bowel: Status post gastric pull-through surgery. No dilated
small bowel. No colon wall thickening. Diverticular disease of the
left colon without acute inflammatory changes. Negative appendix.

Vascular/Lymphatic: Moderate aortic atherosclerosis without
aneurysm. No suspicious adenopathy.

Reproductive: Prostate is unremarkable.

Other: Negative for free air or free fluid. Small fat containing
umbilical hernia

Musculoskeletal: No acute or suspicious osseous abnormality
IMPRESSION: 1. Mild left hydronephrosis and hydroureter secondary to a 4 mm
stone in the mid left ureter.
2. Intrarenal stones bilaterally.
3. Status post gastric pull-through surgery
4. Left colon diverticular disease without acute inflammatory change

## 2019-03-22 NOTE — ED Provider Notes (Signed)
[facility] EMERGENCY DEPARTMENT Provider Note   CSN: [id number] Arrival date & time: 03/22/19  2228     History Chief Complaint  Patient presents with  . Flank Pain    James Wolf is a 69 y.o. male.  The history is provided by the patient.  Flank Pain This is a new problem. The current episode started more than 1 week ago. The problem occurs daily. The problem has been gradually worsening. Associated symptoms include abdominal pain. Pertinent negatives include no chest pain and no shortness of breath. Nothing aggravates the symptoms. Nothing relieves the symptoms.  Patient presents with flank pain and testicle pain He reports he has had this intermittently for 2 weeks, worse over the past day.  He reports long history of kidney stones and has required urologic procedures in the past No fevers or vomiting.  He has no other acute complaints     Past Medical History:  Diagnosis Date  . Anemia   . Aspiration pneumonia ([place])   . Cancer ([place])    esophageal cancer 1995  . COPD (chronic obstructive pulmonary disease) ([place])   . GERD (gastroesophageal reflux disease)   . Hyperlipidemia   . Hypertension   . Osteoporosis     Patient Active Problem List   Diagnosis Date Noted  . Hypertension 07/19/2017  . COPD (chronic obstructive pulmonary disease) ([place]) 07/19/2017  . History of esophageal cancer 07/19/2017  . Acute respiratory failure with hypoxia ([place]) 07/19/2017  . Aspiration pneumonitis ([place]) 07/19/2017  . Syncope 07/19/2017    Past Surgical History:  Procedure Laterality Date  . partial esophageal ejectory         No family history on file.  Social History   Tobacco Use  . Smoking status: Former Smoker    Quit date: 02/19/1993    Years since quitting: 26.1  . Smokeless tobacco: Never Used  Substance Use Topics  . Alcohol use: Not Currently    Comment: occ  . Drug use: Never    Home Medications Prior to Admission medications   Medication Sig Start  Date End Date Taking? Authorizing Provider  B Complex Vitamins (VITAMIN B COMPLEX PO) Take 1 tablet by mouth 2 (two) times daily. 03/10/07   [provider]  diclofenac sodium (VOLTAREN) 1 % GEL 4 GRAM ON SKIN 4 TIMES A DAY AS NEEDED 09/24/15   [provider]  esomeprazole (NEXIUM) 40 MG capsule Take 1 capsule by mouth 2 (two) times daily. 05/26/17   [provider]  sucralfate (CARAFATE) 1 GM/10ML suspension Take 10 mLs (1 g total) by mouth 4 (four) times daily -  with meals and at bedtime. 07/21/17   Manuella Ghazi, Pratik D, DO  telmisartan (MICARDIS) 20 MG tablet Take 1 tablet by mouth daily. 06/30/17   [provider]  Vitamin D, Ergocalciferol, (DRISDOL) 50000 units CAPS capsule Take by mouth.    [provider]    Allergies    Zithromax [azithromycin]  Review of Systems   Review of Systems  Constitutional: Negative for fever.  Respiratory: Negative for shortness of breath.   Cardiovascular: Negative for chest pain.  Gastrointestinal: Positive for abdominal pain.  Genitourinary: Positive for flank pain and testicular pain. Negative for dysuria.  All other systems reviewed and are negative.   Physical Exam Updated Vital Signs BP (!) 157/80 (BP Location: Right Arm)   Pulse 90   Temp 97.8 F (36.6 C) (Oral)   Ht 1.651 m (5\' 5" )   Wt 77.1 kg  SpO2 100%   BMI 28.29 kg/m   Physical Exam CONSTITUTIONAL: Well developed/well nourished HEAD: Normocephalic/atraumatic EYES: EOMI/PERRL ENMT: Mucous membranes moist NECK: supple no meningeal signs SPINE/BACK:entire spine nontender CV: S1/S2 noted, no murmurs/rubs/gallops noted LUNGS: Lungs are clear to auscultation bilaterally, no apparent distress ABDOMEN: soft, nontender, no rebound or guarding, bowel sounds noted throughout abdomen, small umbilical hernia that is easily reducible GU:no cva tenderness, no scrotal tenderness, testicles descended bilaterally, no inguinal hernia, no testicular  tenderness, nurse Vivien Rota present for exam NEURO: Pt is awake/alert/appropriate, moves all extremitiesx4.  No facial droop.   EXTREMITIES: pulses normal/equal, full ROM SKIN: warm, color normal PSYCH: no abnormalities of mood noted, alert and oriented to situation  ED Results / Procedures / Treatments   Labs (all labs ordered are listed, but only abnormal results are displayed) Labs Reviewed  URINALYSIS, ROUTINE W REFLEX MICROSCOPIC - Abnormal; Notable for the following components:      Result Value   Glucose, UA 150 (*)    Hgb urine dipstick SMALL (*)    All other components within normal limits    EKG None  Radiology CT Renal Stone Study  Result Date: 03/22/2019 CLINICAL DATA:  Left-sided flank pain EXAM: CT ABDOMEN AND PELVIS WITHOUT CONTRAST TECHNIQUE: Multidetector CT imaging of the abdomen and pelvis was performed following the standard protocol without IV contrast. COMPARISON:  Chest CT 07/20/2017 FINDINGS: Lower chest: Lung bases demonstrate partially visualized gastric pull-through with internal debris. Atelectasis in the right lower lobe. Mild fibrosis at the lung bases. No acute consolidation or effusion. Normal heart size. Hepatobiliary: No focal liver abnormality is seen. No gallstones, gallbladder wall thickening, or biliary dilatation. Pancreas: Unremarkable. No pancreatic ductal dilatation or surrounding inflammatory changes. Spleen: Normal in size without focal abnormality. Adrenals/Urinary Tract: Adrenal glands are normal. Intrarenal stones bilaterally. On the right, stones measure up to 4 mm in size. On the left, stones measure up to 5 mm in size. Mild left hydronephrosis hydroureter, secondary to a 4 mm stone in the mid left ureter. Bladder is nearly empty Stomach/Bowel: Status post gastric pull-through surgery. No dilated small bowel. No colon wall thickening. 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 Negative appendix. Vascular/Lymphatic:  Moderate aortic atherosclerosis without aneurysm. No suspicious adenopathy. Reproductive: Prostate is unremarkable. Other: Negative for free air or free fluid. Small fat containing umbilical hernia Musculoskeletal: No acute or suspicious osseous abnormality IMPRESSION: 1. Mild left hydronephrosis and hydroureter secondary to a 4 mm stone in the mid left ureter. 2. Intrarenal stones bilaterally. 3. Status post gastric pull-through surgery 4. 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   On: 03/22/2019 23:48    Procedures Procedures  Medications Ordered in ED Medications - No data to display  ED Course  I have reviewed the triage vital signs and the nursing notes.  Pertinent labs & imaging results that were available during my care of the patient were reviewed by me and considered in my medical decision making (see chart for details).    MDM Rules/Calculators/A&P                      Patient is very well-appearing.  He admits that his pain is actually improving at this time.  He declines any pain medication at this time Patient was found to have a left 4 mm ureteral stone with mild hydronephrosis No signs of infectious etiology Patient already has urology follow-up arranged.  He is already on Flomax.  Will give short course of pain medicines.  We discussed return precautions Final Clinical Impression(s) / ED Diagnoses Final diagnoses:  Ureteral colic    Rx / DC Orders ED Discharge Orders         Ordered    HYDROcodone-acetaminophen (NORCO/VICODIN) 5-325 MG tablet  Every 6 hours PRN     03/23/19 0018           Ripley Fraise, MD 03/23/19 0037

## 2019-03-22 NOTE — ED Triage Notes (Signed)
Pt presents to ED with complaints of left sided flank pain and groin pain. Pt with hx of kidney stones. Denies UA symptoms.

## 2019-03-23 MED ORDER — HYDROCODONE-ACETAMINOPHEN 5-325 MG PO TABS: 1.0000 | ORAL_TABLET | Freq: Four times a day (QID) | ORAL | 0 refills | Status: DC | PRN

## 2019-06-29 ENCOUNTER — Encounter: Payer: Self-pay | Admitting: Orthopaedic Surgery

## 2019-06-29 ENCOUNTER — Other Ambulatory Visit: Payer: Self-pay

## 2019-06-29 ENCOUNTER — Ambulatory Visit: Payer: BC Managed Care – PPO | Admitting: Orthopaedic Surgery

## 2019-06-29 ENCOUNTER — Ambulatory Visit: Payer: BC Managed Care – PPO

## 2019-06-29 VITALS — BP 123/78 | HR 95 | Ht 65.5 in | Wt 167.0 lb

## 2019-06-29 DIAGNOSIS — G8929 Other chronic pain: Secondary | ICD-10-CM

## 2019-06-29 DIAGNOSIS — M25512 Pain in left shoulder: Secondary | ICD-10-CM

## 2019-06-29 DIAGNOSIS — Z8501 Personal history of malignant neoplasm of esophagus: Secondary | ICD-10-CM

## 2019-06-29 NOTE — Progress Notes (Signed)
Subjective:    Patient ID: James Wolf, male    DOB: 1950-05-21, 69 y.o.   MRN: [id number]  HPI He has a long history of left shoulder pain getting worse over the last several months.  He has no trauma.  He has pain with overhead use and laying on it in bed.  He has no paresthesias or neck pain, no redness or weakness.  He has history of esophageal cancer about 20 years ago and cannot take any NSAIDs.  He has used Voltaren Gel on the shoulder which helps some as well as ice and heat.  He is tired of the shoulder hurting.  I have notes from [facility] and I have reviewed the notes dated 06-09-2019.  He has not had any x-rays recently.   Review of Systems  Constitutional: Positive for activity change.  Respiratory: Positive for shortness of breath.   Musculoskeletal: Positive for arthralgias and back pain.  All other systems reviewed and are negative. He has history of compression fractures in the back.  For Review of Systems, all other systems reviewed and are negative.  The following is a summary of the past history medically, past history surgically, known current medicines, social history and family history.  This information is gathered electronically by the computer from prior information and documentation.  I review this each visit and have found including this information at this point in the chart is beneficial and informative.   Past Medical History:  Diagnosis Date  . Anemia   . Aspiration pneumonia ([place])   . Cancer ([place])    esophageal cancer 1995  . Chest pain   . COPD (chronic obstructive pulmonary disease) ([place])   . Depression   . GERD (gastroesophageal reflux disease)   . 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 Hyperlipidemia   . Hypertension   . Joint pain   . Loss of consciousness ([place])   . Osteomyelitis ([place])   . Osteoporosis   . Rheumatic fever     Past Surgical History:  Procedure Laterality Date  . partial esophageal ejectory      Current Outpatient  Medications on File Prior to Visit  Medication Sig Dispense Refill  . B Complex Vitamins (VITAMIN B COMPLEX PO) Take 1 tablet by mouth 2 (two) times daily.    . diclofenac sodium (VOLTAREN) 1 % GEL 4 GRAM ON SKIN 4 TIMES A DAY AS NEEDED    . esomeprazole (NEXIUM) 40 MG capsule Take 1 capsule by mouth 2 (two) times daily.    Marland Kitchen telmisartan (MICARDIS) 20 MG tablet Take 1 tablet by mouth daily.    Marland Kitchen HYDROcodone-acetaminophen (NORCO/VICODIN) 5-325 MG tablet Take 1 tablet by mouth every 6 (six) hours as needed. (Patient not taking: Reported on 06/29/2019) 5 tablet 0  . sucralfate (CARAFATE) 1 GM/10ML suspension Take 10 mLs (1 g total) by mouth 4 (four) times daily -  with meals and at bedtime. (Patient not taking: Reported on 06/29/2019) 420 mL 0  . Vitamin D, Ergocalciferol, (DRISDOL) 50000 units CAPS capsule Take by mouth. (Patient not taking: Reported on 06/29/2019)     No current facility-administered medications on file prior to visit.    Social History   Socioeconomic History  . Marital status: Married    Spouse name: Not on file  . Number of children: Not on file  . Years of education: Not on file  . Highest education level: Not on file  Occupational History  . Not on file  Tobacco  Use  . Smoking status: Former Smoker    Quit date: 02/19/1993    Years since quitting: 26.3  . Smokeless tobacco: Never Used  Substance and Sexual Activity  . Alcohol use: Not Currently    Comment: occ  . Drug use: Never  . Sexual activity: Not on file  Other Topics Concern  . Not on file  Social History Narrative  . Not on file   Social Determinants of Health   Financial Resource Strain:   . Difficulty of Paying Living Expenses:   Food Insecurity:   . Worried About Charity fundraiser in the Last Year:   . Arboriculturist in the Last Year:   Transportation Needs:   . Film/video editor (Medical):   Marland Kitchen Lack of Transportation (Non-Medical):   Physical Activity:   . Days of Exercise per Week:    . Minutes of Exercise per Session:   Stress:   . Feeling of Stress :   Social Connections:   . Frequency of Communication with Friends and Family:   . Frequency of Social Gatherings with Friends and Family:   . Attends Religious Services:   . Active Member of Clubs or Organizations:   . Attends Archivist Meetings:   Marland Kitchen Marital Status:   Intimate Partner Violence:   . Fear of Current or Ex-Partner:   . Emotionally Abused:   Marland Kitchen Physically Abused:   . Sexually Abused:     History reviewed. No pertinent family history.  BP 123/78   Pulse 95   Ht 5' 5.5" (1.664 m)   Wt 167 lb (75.8 kg)   BMI 27.37 kg/m   Body mass index is 27.37 kg/m.      Objective:   Physical Exam Vitals and nursing note reviewed.  Constitutional:      Appearance: He is well-developed.  HENT:     Head: Normocephalic and atraumatic.  Eyes:     Conjunctiva/sclera: Conjunctivae normal.     Pupils: Pupils are equal, round, and reactive to light.  Cardiovascular:     Rate and Rhythm: Normal rate and regular rhythm.  Pulmonary:     Effort: Pulmonary effort is normal.  Abdominal:     Palpations: Abdomen is soft.  Musculoskeletal:       Arms:     Cervical back: Normal range of motion and neck supple.  Skin:    General: Skin is warm and dry.  Neurological:     Mental Status: He is alert and oriented to person, place, and time.     Cranial Nerves: No cranial nerve deficit.     Motor: No abnormal muscle tone.     Coordination: Coordination normal.     Deep Tendon Reflexes: Reflexes are normal and symmetric. Reflexes normal.  Psychiatric:        Behavior: Behavior normal.        Thought Content: Thought content normal.        Judgment: Judgment normal.    X-rays were done of the left shoulder, reported separately.       Assessment & Plan:   Encounter Diagnoses  Name Primary?  . Chronic left shoulder pain Yes  . History of esophageal cancer    PROCEDURE NOTE:  The patient  request injection, verbal consent was obtained.  The left shoulder was prepped appropriately after time out was performed.   Sterile technique was observed and injection of 1 cc of Depo-Medrol 40 mg with several cc's of plain xylocaine.  Anesthesia was provided by ethyl chloride and a 20-gauge needle was used to inject the shoulder area. A posterior approach was used.  The injection was tolerated well.  A band aid dressing was applied.  The patient was advised to apply ice later today and tomorrow to the injection sight as needed.  Continue the Voltaren Gel.  Begin PT in [place].  He is going to beach in the next few weeks.  I have gone over exercises with him to do in a pool.  Return in one month.  Call if any problem.  Precautions discussed.   Electronically Signed Sanjuana Kava, MD 6/22/20212:38 PM

## 2019-07-27 ENCOUNTER — Other Ambulatory Visit: Payer: Self-pay

## 2019-07-27 ENCOUNTER — Encounter: Payer: Self-pay | Admitting: Orthopaedic Surgery

## 2019-07-27 ENCOUNTER — Ambulatory Visit (INDEPENDENT_AMBULATORY_CARE_PROVIDER_SITE_OTHER): Payer: BC Managed Care – PPO | Admitting: Orthopaedic Surgery

## 2019-07-27 VITALS — Ht 65.5 in | Wt 169.0 lb

## 2019-07-27 DIAGNOSIS — M25512 Pain in left shoulder: Secondary | ICD-10-CM

## 2019-07-27 DIAGNOSIS — G8929 Other chronic pain: Secondary | ICD-10-CM | POA: Diagnosis not present

## 2019-07-27 MED ORDER — PREDNISONE 5 MG (21) PO TBPK: ORAL_TABLET | ORAL | 0 refills | Status: DC

## 2019-07-27 NOTE — Progress Notes (Signed)
Patient James Wolf, male DOB:Sep 06, 1950, 69 y.o. [id number]  Chief Complaint  Patient presents with  . Shoulder Pain    Lt shoulder    HPI  James Wolf is a 69 y.o. male who has right shoulder pain.  He has been to PT and is making slow progress.  He went to [facility] last week and did well with no new trauma.  I have read the PT notes.  I want him to continue this.  Most of his pain is anterior on the left shoulder.  I will call in prednisone dose pack.   Body mass index is 27.7 kg/m.  ROS  Review of Systems  Constitutional: Positive for activity change.  Respiratory: Positive for shortness of breath.   Musculoskeletal: Positive for arthralgias and back pain.  All other systems reviewed and are negative.   All other systems reviewed and are negative.  The following is a summary of the past history medically, past history surgically, known current medicines, social history and family history.  This information is gathered electronically by the computer from prior information and documentation.  I review this each visit and have found including this information at this point in the chart is beneficial and informative.    Past Medical History:  Diagnosis Date  . Anemia   . Aspiration pneumonia ([place])   . Cancer ([place])    esophageal cancer 1995  . Chest pain   . COPD (chronic obstructive pulmonary disease) ([place])   . GERD (gastroesophageal reflux disease)   . Hyperlipidemia   . Hypertension   . Joint pain   . Osteomyelitis ([place])   . Osteoporosis   . Rheumatic fever     Past Surgical History:  Procedure Laterality Date  . partial esophageal ejectory      History reviewed. No pertinent family history.  Social History Social History   Tobacco Use  . Smoking status: Former Smoker    Quit date: 02/19/1993    Years since quitting: 26.4  . Smokeless tobacco: Never Used  Substance Use Topics  . Alcohol use: Not Currently    Comment: occ  . Drug  use: Never    Allergies  Allergen Reactions  . Zithromax [Azithromycin] Diarrhea    Causes pt's stomach to "tear up" does not want to take medication again.     Current Outpatient Medications  Medication Sig Dispense Refill  . B Complex Vitamins (VITAMIN B COMPLEX PO) Take 1 tablet by mouth 2 (two) times daily.    . diclofenac sodium (VOLTAREN) 1 % GEL 4 GRAM ON SKIN 4 TIMES A DAY AS NEEDED    . esomeprazole (NEXIUM) 40 MG capsule Take 1 capsule by mouth 2 (two) times daily.    Marland Kitchen telmisartan (MICARDIS) 20 MG tablet Take 1 tablet by mouth daily.    Marland Kitchen HYDROcodone-acetaminophen (NORCO/VICODIN) 5-325 MG tablet Take 1 tablet by mouth every 6 (six) hours as needed. (Patient not taking: Reported on 06/29/2019) 5 tablet 0  . predniSONE (STERAPRED UNI-PAK 21 TAB) 5 MG (21) TBPK tablet Take 6 pills first day; 5 pills second day; 4 pills third day; 3 pills fourth day; 2 pills next day and 1 pill last day. 21 tablet 0  . sucralfate (CARAFATE) 1 GM/10ML suspension Take 10 mLs (1 g total) by mouth 4 (four) times daily -  with meals and at bedtime. (Patient not taking: Reported on 06/29/2019) 420 mL 0  . Vitamin D, Ergocalciferol, (DRISDOL) 50000 units CAPS capsule Take by mouth. (  Patient not taking: Reported on 06/29/2019)     No current facility-administered medications for this visit.     Physical Exam  Height 5' 5.5" (1.664 m), weight 169 lb (76.7 kg).  Constitutional: overall normal hygiene, normal nutrition, well developed, normal grooming, normal body habitus. Assistive device:none  Musculoskeletal: gait and station Limp none, muscle tone and strength are normal, no tremors or atrophy is present.  .  Neurological: coordination overall normal.  Deep tendon reflex/nerve stretch intact.  Sensation normal.  Cranial nerves II-XII intact.   Skin:   Normal overall no scars, lesions, ulcers or rashes. No psoriasis.  Psychiatric: Alert and oriented x 3.  Recent memory intact, remote memory unclear.   Normal mood and affect. Well groomed.  Good eye contact.  Cardiovascular: overall no swelling, no varicosities, no edema bilaterally, normal temperatures of the legs and arms, no clubbing, cyanosis and good capillary refill.  Examination of left Upper Extremity is done.  Inspection:   Overall:  Elbow non-tender without crepitus or defects, forearm non-tender without crepitus or defects, wrist non-tender without crepitus or defects, hand non-tender.    Shoulder: with glenohumeral joint tenderness, without effusion.   Upper arm:  without swelling and tenderness   Range of motion:   Overall:  Full range of motion of the elbow, full range of motion of wrist and full range of motion in fingers.   Shoulder:  left  160 degrees forward flexion; 145 degrees abduction; 25 degrees internal rotation, 25 degrees external rotation, 10 degrees extension, 40 degrees adduction.   Stability:   Overall:  Shoulder, elbow and wrist stable   Strength and Tone:   Overall full shoulder muscles strength, full upper arm strength and normal upper arm bulk and tone.  Lymphatic: palpation is normal.  All other systems reviewed and are negative   The patient has been educated about the nature of the problem(s) and counseled on treatment options.  The patient appeared to understand what I have discussed and is in agreement with it.  Encounter Diagnosis  Name Primary?  . Chronic left shoulder pain Yes    PLAN Call if any problems.  Precautions discussed.  Continue current medications. Prednisone dose pack called in.  Return to clinic 3 weeks   Electronically Signed Sanjuana Kava, MD 7/20/20212:59 PM

## 2019-08-17 ENCOUNTER — Ambulatory Visit: Payer: BC Managed Care – PPO | Admitting: Orthopaedic Surgery

## 2019-08-17 ENCOUNTER — Other Ambulatory Visit: Payer: Self-pay

## 2019-08-17 ENCOUNTER — Encounter: Payer: Self-pay | Admitting: Orthopaedic Surgery

## 2019-08-17 VITALS — BP 132/91 | HR 85 | Ht 65.5 in | Wt 170.5 lb

## 2019-08-17 DIAGNOSIS — M25512 Pain in left shoulder: Secondary | ICD-10-CM | POA: Diagnosis not present

## 2019-08-17 DIAGNOSIS — G8929 Other chronic pain: Secondary | ICD-10-CM

## 2019-08-17 NOTE — Progress Notes (Signed)
Patient DX:James Wolf, male DOB:04-20-1950, 69 y.o. VEH:[id number]  Chief Complaint  Patient presents with  . Shoulder Pain    L/still hurting some/PT helps some    HPI  James Wolf is a 69 y.o. male who has continued pain in the left shoulder.  He has been to PT and is doing swimming.  I do not have the PT notes to review.  I am concerned about a tear of the rotator cuff.  I will get MRI.  He has no new trauma, no numbness.   Body mass index is 27.94 kg/m.  ROS  Review of Systems  Constitutional: Positive for activity change.  Respiratory: Positive for shortness of breath.   Musculoskeletal: Positive for arthralgias and back pain.  All other systems reviewed and are negative.   All other systems reviewed and are negative.  The following is a summary of the past history medically, past history surgically, known current medicines, social history and family history.  This information is gathered electronically by the computer from prior information and documentation.  I review this each visit and have found including this information at this point in the chart is beneficial and informative.    Past Medical History:  Diagnosis Date  . Anemia   . Aspiration pneumonia ([place])   . Cancer ([place])    esophageal cancer 1995  . Chest pain   . COPD (chronic obstructive pulmonary disease) ([place])   . GERD (gastroesophageal reflux disease)   . Hyperlipidemia   . Hypertension   . Joint pain   . Osteomyelitis ([place])   . Osteoporosis   . Rheumatic fever     Past Surgical History:  Procedure Laterality Date  . partial esophageal ejectory      No family history on file.  Social History Social History   Tobacco Use  . Smoking status: Former Smoker    Quit date: 02/19/1993    Years since quitting: 26.5  . Smokeless tobacco: Never Used  Substance Use Topics  . Alcohol use: Not Currently    Comment: occ  . Drug use: Never    Allergies  Allergen Reactions  . Zithromax  [Azithromycin] Diarrhea    Causes pt's stomach to "tear up" does not want to take medication again.     Current Outpatient Medications  Medication Sig Dispense Refill  . B Complex Vitamins (VITAMIN B COMPLEX PO) Take 1 tablet by mouth 2 (two) times daily.    . diclofenac sodium (VOLTAREN) 1 % GEL 4 GRAM ON SKIN 4 TIMES A DAY AS NEEDED    . esomeprazole (NEXIUM) 40 MG capsule Take 1 capsule by mouth 2 (two) times daily.    Marland Kitchen HYDROcodone-acetaminophen (NORCO/VICODIN) 5-325 MG tablet Take 1 tablet by mouth every 6 (six) hours as needed. 5 tablet 0  . predniSONE (STERAPRED UNI-PAK 21 TAB) 5 MG (21) TBPK tablet Take 6 pills first day; 5 pills second day; 4 pills third day; 3 pills fourth day; 2 pills next day and 1 pill last day. 21 tablet 0  . sucralfate (CARAFATE) 1 GM/10ML suspension Take 10 mLs (1 g total) by mouth 4 (four) times daily -  with meals and at bedtime. 420 mL 0  . telmisartan (MICARDIS) 20 MG tablet Take 1 tablet by mouth daily.    . Vitamin D, Ergocalciferol, (DRISDOL) 50000 units CAPS capsule Take by mouth.      No current facility-administered medications for this visit.     Physical Exam  Blood pressure Marland Kitchen)  132/91, pulse 85, height 5' 5.5" (1.664 m), weight 170 lb 8 oz (77.3 kg).  Constitutional: overall normal hygiene, normal nutrition, well developed, normal grooming, normal body habitus. Assistive device:none  Musculoskeletal: gait and station Limp none, muscle tone and strength are normal, no tremors or atrophy is present.  .  Neurological: coordination overall normal.  Deep tendon reflex/nerve stretch intact.  Sensation normal.  Cranial nerves II-XII intact.   Skin:   Normal overall no scars, lesions, ulcers or rashes. No psoriasis.  Psychiatric: Alert and oriented x 3.  Recent memory intact, remote memory unclear.  Normal mood and affect. Well groomed.  Good eye contact.  Cardiovascular: overall no swelling, no varicosities, no edema bilaterally, normal  temperatures of the legs and arms, no clubbing, cyanosis and good capillary refill.  Left shoulder has good ROM but pain in the extremes.  Pain to resisted abduction.  NV intact.  Lymphatic: palpation is normal.  All other systems reviewed and are negative   The patient has been educated about the nature of the problem(s) and counseled on treatment options.  The patient appeared to understand what I have discussed and is in agreement with it.  No diagnosis found.  PLAN Call if any problems.  Precautions discussed.  Continue current medications.   Return to clinic 3 weeks   Get MRI of the left shoulder  Continue PT.  Electronically Signed Sanjuana Kava, MD 8/10/20212:51 PM

## 2019-08-25 ENCOUNTER — Ambulatory Visit (HOSPITAL_COMMUNITY): Payer: BC Managed Care – PPO

## 2019-09-07 ENCOUNTER — Ambulatory Visit: Payer: BC Managed Care – PPO | Admitting: Orthopaedic Surgery

## 2019-09-20 ENCOUNTER — Ambulatory Visit (HOSPITAL_COMMUNITY)
Admission: RE | Admit: 2019-09-20 | Discharge: 2019-09-20 | Disposition: A | Discharge status: Home / Self Care | Payer: BC Managed Care – PPO | Source: Ambulatory Visit | Attending: Orthopaedic Surgery | Admitting: Orthopaedic Surgery

## 2019-09-20 ENCOUNTER — Other Ambulatory Visit: Payer: Self-pay

## 2019-09-20 DIAGNOSIS — G8929 Other chronic pain: Secondary | ICD-10-CM | POA: Insufficient documentation

## 2019-09-20 DIAGNOSIS — M25512 Pain in left shoulder: Secondary | ICD-10-CM | POA: Diagnosis present

## 2019-09-20 IMAGING — MR MR SHOULDER*L* W/O CM
5 series · 40 of 40 positions shown · non-contrast
Comparison: Left shoulder x-rays dated [DATE].

CLINICAL DATA: Chronic left shoulder pain for the past 6 months. No
injury or prior surgery.

EXAM:
MRI OF THE LEFT SHOULDER WITHOUT CONTRAST
TECHNIQUE: Multiplanar, multisequence MR imaging of the shoulder was performed.
No intravenous contrast was administered.

[Series 6: T2 fat-sat · sagittal · left · 4.0mm · 0.47mm/px · 8 of 24 slices shown (1 of 2)]
[im 1/24]
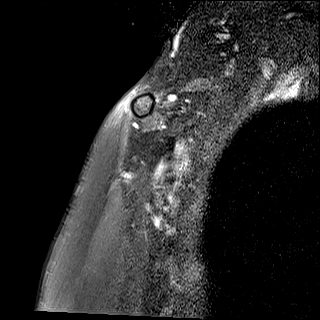
[im 4/24]
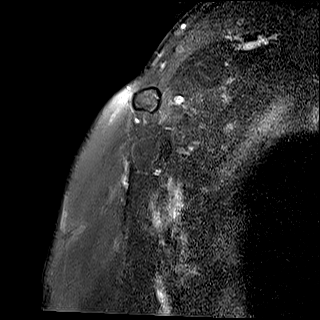
[im 7/24]
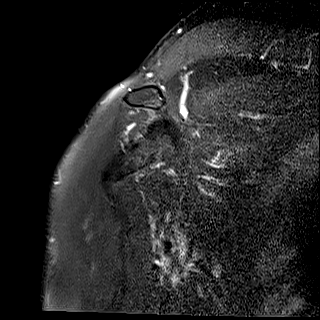
[im 10/24]
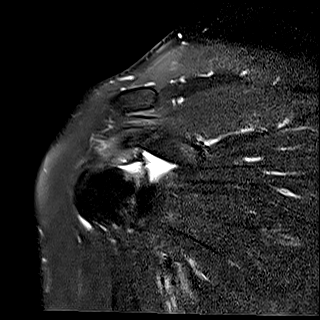
[im 14/24]
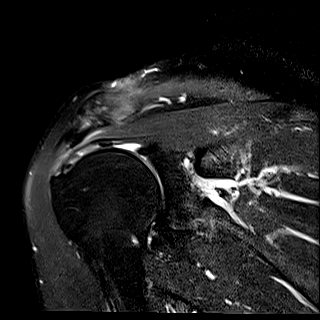
[im 17/24]
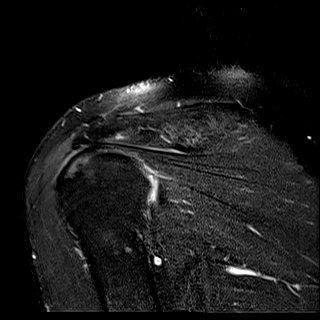
[im 20/24]
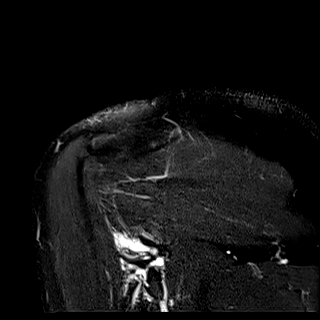
[im 24/24]
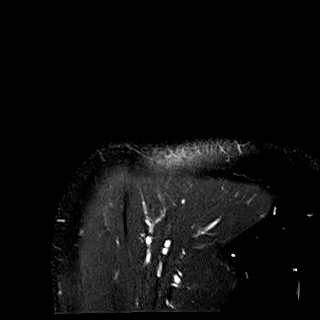

[Series 7: PD fat-sat · sagittal · left · 4.0mm · 0.43mm/px · 8 of 24 slices shown (1 of 2)]
[im 1/24]
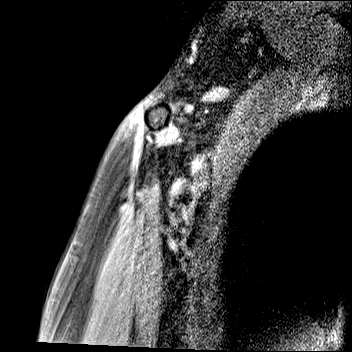
[im 4/24]
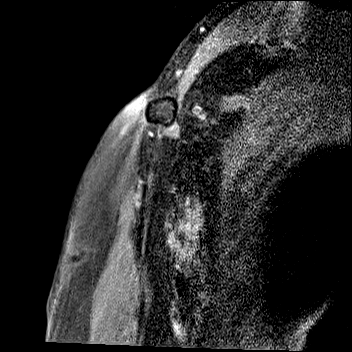
[im 7/24]
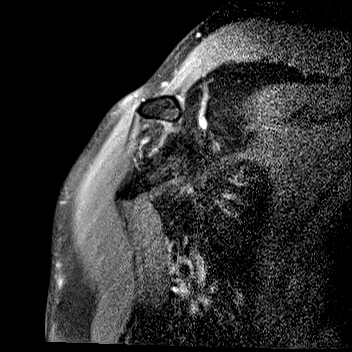
[im 10/24]
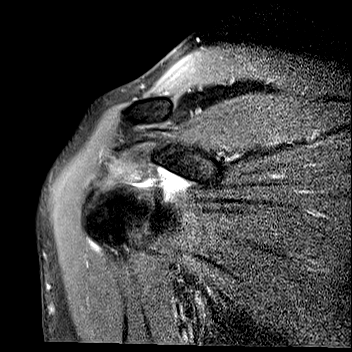
[im 14/24]
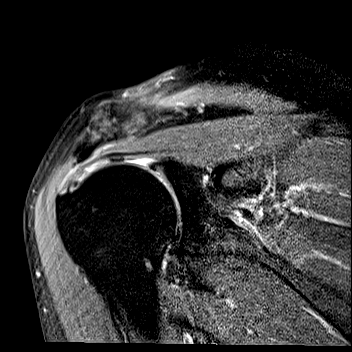
[im 17/24]
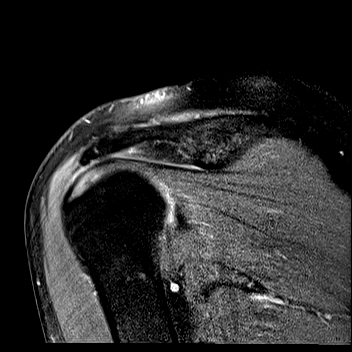
[im 20/24]
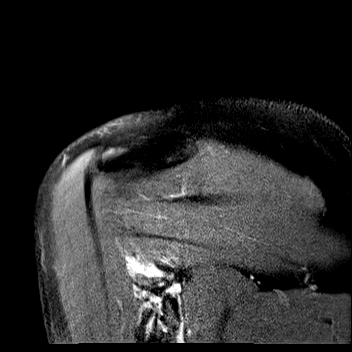
[im 24/24]
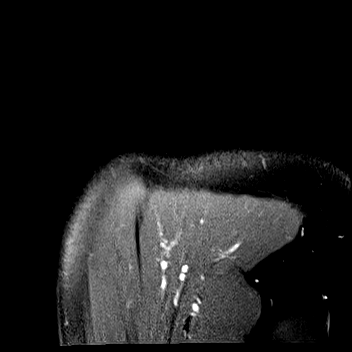

[Series 8: T2 fat-sat · coronal · left · 4.0mm · 0.47mm/px · 8 of 25 slices shown (2 of 2)]
[im 1/25]
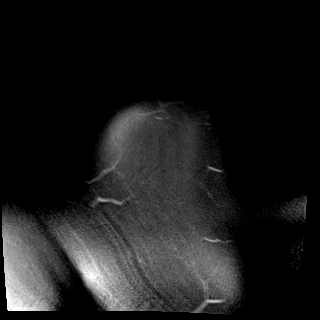
[im 4/25]
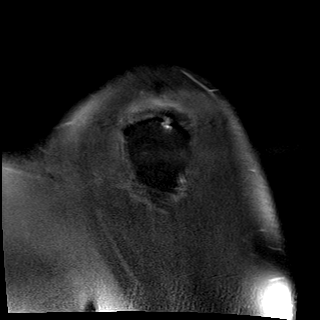
[im 7/25]
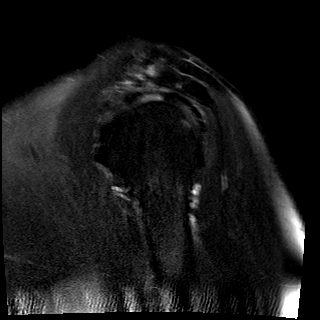
[im 11/25]
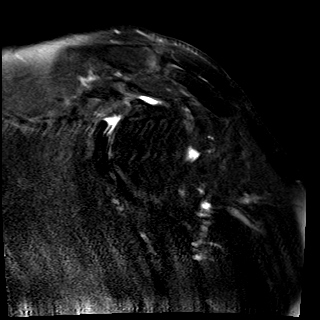
[im 14/25]
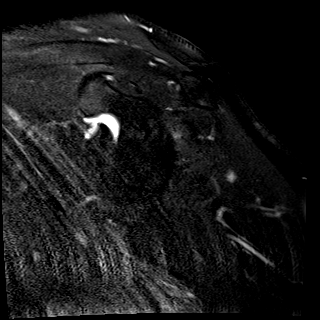
[im 18/25]
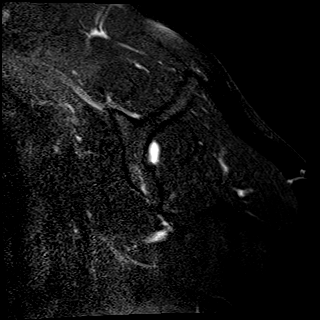
[im 21/25]
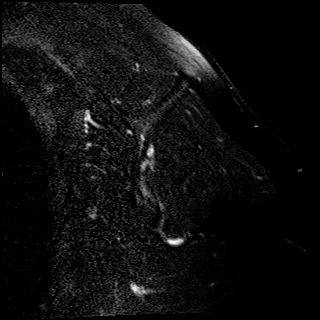
[im 25/25]
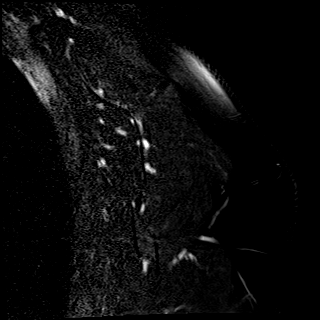

[Series 9: T1 · coronal · left · 4.0mm · 0.47mm/px · 8 of 25 slices shown]
[im 1/25]
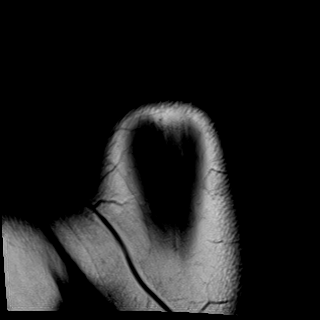
[im 4/25]
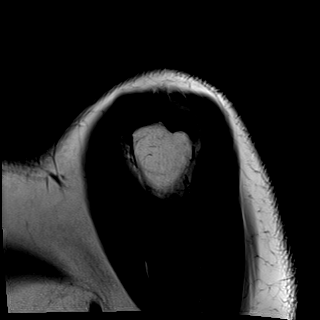
[im 7/25]
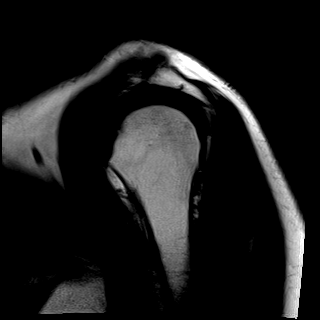
[im 11/25]
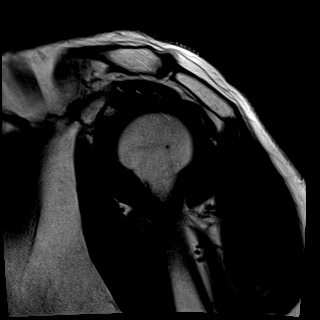
[im 14/25]
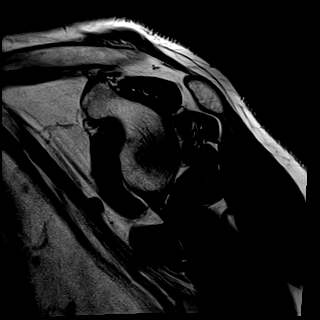
[im 18/25]
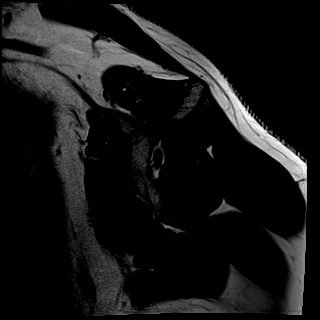
[im 21/25]
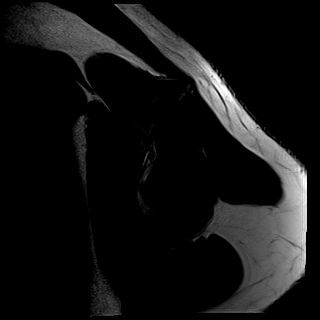
[im 25/25]
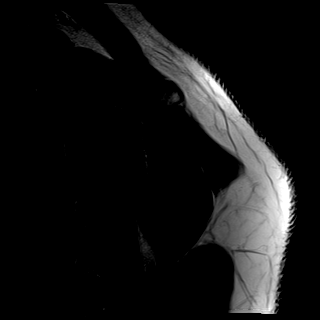

[Series 11: PD fat-sat · axial · left · 4.0mm · 0.36mm/px · z∈[-92,+15]mm · 8 of 25 slices shown (2 of 2)]
[im 1/25]
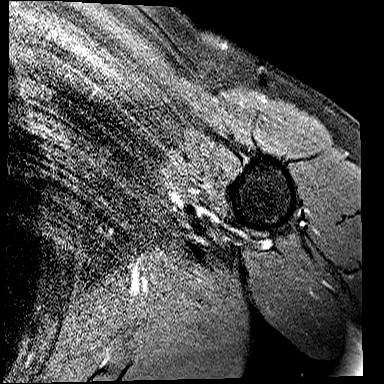
[im 4/25]
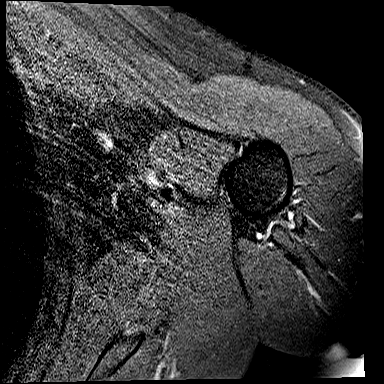
[im 7/25]
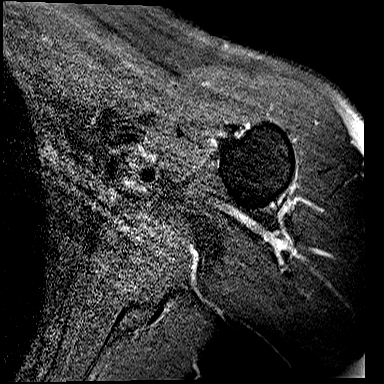
[im 11/25]
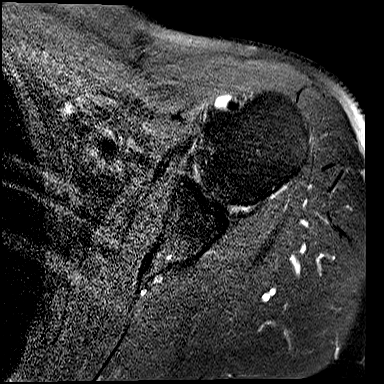
[im 14/25]
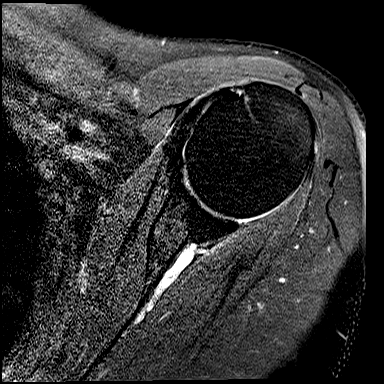
[im 18/25]
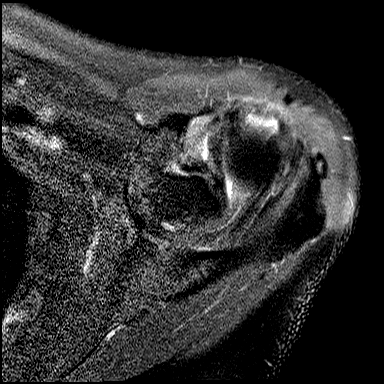
[im 21/25]
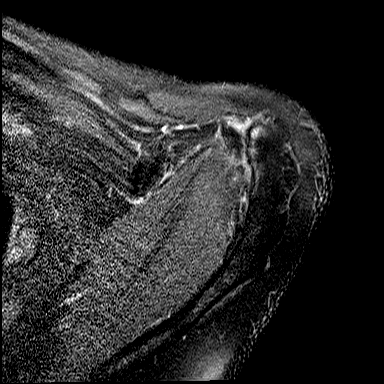
[im 25/25]
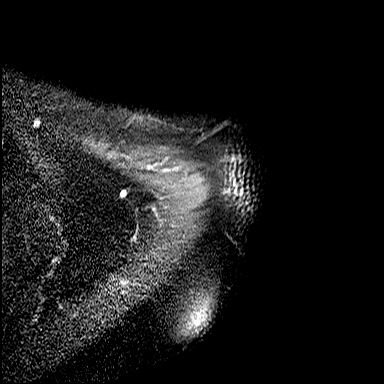

[40 of 40 positions shown; findings below may reference images not displayed]

FINDINGS: Rotator cuff: Moderate supraspinatus tendinosis with low-grade
partial-thickness articular surface tear of the mid and distal
tendon. Moderate distal infraspinatus tendinosis. The subscapularis
and teres minor tendons are unremarkable.

Muscles: No atrophy or abnormal signal of the muscles of the rotator
cuff.

Biceps long head:  Intact and normally positioned.

Acromioclavicular Joint: Mild arthropathy of the acromioclavicular
joint. Type II acromion. No subacromial/subdeltoid bursal fluid.

Glenohumeral Joint: No joint effusion. Mild diffuse cartilage
thinning.

Labrum: Grossly intact, but evaluation is limited by lack of
intraarticular fluid. Anterior superior sublabral foramen.

Bones:  No marrow abnormality, fracture or dislocation.

Other: None.
IMPRESSION: 1. Moderate supraspinatus tendinosis with low-grade
partial-thickness articular surface tear of the mid and distal
tendon.
2. Moderate distal infraspinatus tendinosis.
3. Mild acromioclavicular and glenohumeral osteoarthritis.

## 2019-09-28 ENCOUNTER — Encounter: Payer: Self-pay | Admitting: Orthopaedic Surgery

## 2019-09-28 ENCOUNTER — Other Ambulatory Visit: Payer: Self-pay

## 2019-09-28 ENCOUNTER — Ambulatory Visit: Payer: BC Managed Care – PPO | Admitting: Orthopaedic Surgery

## 2019-09-28 VITALS — BP 147/86 | HR 76 | Ht 65.5 in | Wt 178.0 lb

## 2019-09-28 DIAGNOSIS — M25512 Pain in left shoulder: Secondary | ICD-10-CM | POA: Diagnosis not present

## 2019-09-28 DIAGNOSIS — Z8501 Personal history of malignant neoplasm of esophagus: Secondary | ICD-10-CM | POA: Diagnosis not present

## 2019-09-28 DIAGNOSIS — G8929 Other chronic pain: Secondary | ICD-10-CM | POA: Diagnosis not present

## 2019-09-28 NOTE — Progress Notes (Signed)
Patient NO:IBBCWUG James Wolf, male DOB:July 29, 1950, 69 y.o. QBV:[id number]  Chief Complaint  Patient presents with  . Shoulder Pain    HPI  James Wolf is a 69 y.o. male who has shoulder pain on the left.  He has been to PT, has done exercises, taken medicine, had injection and still hurts.  He had MRI which showed: IMPRESSION: 1. Moderate supraspinatus tendinosis with low-grade partial-thickness articular surface tear of the mid and distal tendon. 2. Moderate distal infraspinatus tendinosis. 3. Mild acromioclavicular and glenohumeral osteoarthritis.  I have explained the findings to him.  He says he is tired of hurting and would like to consider possible arthroscopy of the shoulder. I will have him see Dr. Amedeo Kinsman for this.  Patient is agreeable.   Body mass index is 29.17 kg/m.  ROS  Review of Systems  Constitutional: Positive for activity change.  Respiratory: Positive for shortness of breath.   Musculoskeletal: Positive for arthralgias and back pain.  All other systems reviewed and are negative.   All other systems reviewed and are negative.  The following is a summary of the past history medically, past history surgically, known current medicines, social history and family history.  This information is gathered electronically by the computer from prior information and documentation.  I review this each visit and have found including this information at this point in the chart is beneficial and informative.    Past Medical History:  Diagnosis Date  . Anemia   . Aspiration pneumonia ([place])   . Cancer ([place])    esophageal cancer 1995  . Chest pain   . COPD (chronic obstructive pulmonary disease) ([place])   . GERD (gastroesophageal reflux disease)   . Hyperlipidemia   . Hypertension   . Joint pain   . Osteomyelitis ([place])   . Osteoporosis   . Rheumatic fever     Past Surgical History:  Procedure Laterality Date  . partial esophageal ejectory      No family  history on file.  Social History Social History   Tobacco Use  . Smoking status: Former Smoker    Quit date: 02/19/1993    Years since quitting: 26.6  . Smokeless tobacco: Never Used  Substance Use Topics  . Alcohol use: Not Currently    Comment: occ  . Drug use: Never    Allergies  Allergen Reactions  . Zithromax [Azithromycin] Diarrhea    Causes pt's stomach to "tear up" does not want to take medication again.     Current Outpatient Medications  Medication Sig Dispense Refill  . B Complex Vitamins (VITAMIN B COMPLEX PO) Take 1 tablet by mouth 2 (two) times daily.    . diclofenac sodium (VOLTAREN) 1 % GEL 4 GRAM ON SKIN 4 TIMES A DAY AS NEEDED    . esomeprazole (NEXIUM) 40 MG capsule Take 1 capsule by mouth 2 (two) times daily.    Marland Kitchen HYDROcodone-acetaminophen (NORCO/VICODIN) 5-325 MG tablet Take 1 tablet by mouth every 6 (six) hours as needed. 5 tablet 0  . predniSONE (STERAPRED UNI-PAK 21 TAB) 5 MG (21) TBPK tablet Take 6 pills first day; 5 pills second day; 4 pills third day; 3 pills fourth day; 2 pills next day and 1 pill last day. 21 tablet 0  . sucralfate (CARAFATE) 1 GM/10ML suspension Take 10 mLs (1 g total) by mouth 4 (four) times daily -  with meals and at bedtime. 420 mL 0  . telmisartan (MICARDIS) 20 MG tablet Take 1 tablet by mouth daily.    Marland Kitchen  Vitamin D, Ergocalciferol, (DRISDOL) 50000 units CAPS capsule Take by mouth.      No current facility-administered medications for this visit.     Physical Exam  Blood pressure (!) 147/86, pulse 76, height 5' 5.5" (1.664 m), weight 178 lb (80.7 kg).  Constitutional: overall normal hygiene, normal nutrition, well developed, normal grooming, normal body habitus. Assistive device:none  Musculoskeletal: gait and station Limp none, muscle tone and strength are normal, no tremors or atrophy is present.  .  Neurological: coordination overall normal.  Deep tendon reflex/nerve stretch intact.  Sensation normal.  Cranial nerves  II-XII intact.   Skin:   Normal overall no scars, lesions, ulcers or rashes. No psoriasis.  Psychiatric: Alert and oriented x 3.  Recent memory intact, remote memory unclear.  Normal mood and affect. Well groomed.  Good eye contact.  Cardiovascular: overall no swelling, no varicosities, no edema bilaterally, normal temperatures of the legs and arms, no clubbing, cyanosis and good capillary refill.  Lymphatic: palpation is normal.  He has painful ROM of the left shoulder with more pain in abduction and forward motion.  NV intact.  All other systems reviewed and are negative   The patient has been educated about the nature of the problem(s) and counseled on treatment options.  The patient appeared to understand what I have discussed and is in agreement with it.  Encounter Diagnoses  Name Primary?  . Chronic left shoulder pain Yes  . History of esophageal cancer     PLAN Call if any problems.  Precautions discussed.  Continue current medications.   Return to clinic to see Dr. Amedeo Kinsman   Electronically Signed Sanjuana Kava, MD 9/21/20212:11 PM

## 2019-10-14 ENCOUNTER — Other Ambulatory Visit: Payer: Self-pay

## 2019-10-14 ENCOUNTER — Ambulatory Visit (INDEPENDENT_AMBULATORY_CARE_PROVIDER_SITE_OTHER): Payer: BC Managed Care – PPO | Admitting: Orthopedic Surgery

## 2019-10-14 VITALS — BP 143/87 | HR 94 | Ht 65.5 in | Wt 173.0 lb

## 2019-10-14 DIAGNOSIS — M75112 Incomplete rotator cuff tear or rupture of left shoulder, not specified as traumatic: Secondary | ICD-10-CM

## 2019-10-14 NOTE — Progress Notes (Signed)
James Wolf  Chief Complaint  Patient presents with  . Shoulder Pain    left shoulder surgical consult     69 yo male referred to me by Dr Luna Glasgow for possible surgery on left shoulder;  [name] was treated with prednisone injection and physical therapy for chronic shoulder pain which started back in March with no history of trauma.  He had an MRI which showed he has a partial articular surface tear of his supraspinatus.  He complains of pain weakness and loss of motion in his left shoulder.  He has a history of esophageal cancer and a complication of that surgery included severe reflux when the patient is lying flat in the supine position.       Review of Systems  Constitutional: Negative for chills and fever.  Respiratory: Negative for shortness of breath.   Cardiovascular: Negative for chest pain.  Gastrointestinal: Positive for heartburn.  All other systems reviewed and are negative.    Past Medical History:  Diagnosis Date  . Anemia   . Aspiration pneumonia ([place])   . Cancer ([place])    esophageal cancer 1995  . Chest pain   . COPD (chronic obstructive pulmonary disease) ([place])   . GERD (gastroesophageal reflux disease)   . Hyperlipidemia   . Hypertension   . Joint pain   . Osteomyelitis ([place])   . Osteoporosis   . Rheumatic fever     Past Surgical History:  Procedure Laterality Date  . partial esophageal ejectory      No family history on file. Social History   Tobacco Use  . Smoking status: Former Smoker    Quit date: 02/19/1993    Years since quitting: 26.6  . Smokeless tobacco: Never Used  Substance Use Topics  . Alcohol use: Not Currently    Comment: occ  . Drug use: Never    Allergies  Allergen Reactions  . Zithromax [Azithromycin] Diarrhea    Causes pt's stomach to "tear up" does not want to take medication again.      Current Meds  Medication Sig  . B Complex Vitamins (VITAMIN B COMPLEX PO) Take 1 tablet by mouth 2 (two) times daily.  .  diclofenac sodium (VOLTAREN) 1 % GEL 4 GRAM ON SKIN 4 TIMES A DAY AS NEEDED  . esomeprazole (NEXIUM) 40 MG capsule Take 1 capsule by mouth 2 (two) times daily.  Marland Kitchen telmisartan (MICARDIS) 20 MG tablet Take 1 tablet by mouth daily.  . Vitamin D, Ergocalciferol, (DRISDOL) 50000 units CAPS capsule Take by mouth.     BP (!) 143/87   Pulse 94   Ht 5' 5.5" (1.664 m)   Wt 173 lb (78.5 kg)   BMI 28.35 kg/m   Physical Exam Constitutional:      General: He is not in acute distress.    Appearance: He is well-developed.  Cardiovascular:     Comments: No peripheral edema Skin:    General: Skin is warm and dry.  Neurological:     Mental Status: He is alert and oriented to person, place, and time.     Sensory: No sensory deficit.     Coordination: Coordination normal.     Gait: Gait normal.     Deep Tendon Reflexes: Reflexes are normal and symmetric.     Ortho Exam  Left shoulder: Tenderness in the rotator interval anterior shoulder joint  Active motion: Flexion 150 degrees  External rotation 50 degrees  Abduction 100 degrees   Passive motion flexion 150 degrees  External rotation 50 degrees  Abduction 100 degrees   Drop test was normal  Empty can sign mild weakness grade 4 supraspinatus normal 5 out of 5 infraspinatus and external rotation   Impingement sign was +820 degrees of flexion   MEDICAL DECISION SECTION    My independent reading of xrays: Plain films show no arthritis  MRI shows a tear at the watershed region with a thin wisp of tissue on the bursal side, undersurface tear primarily supraspinatus tendon   Encounter Diagnosis  Name Primary?  . Incomplete rotator cuff tear or rupture of left shoulder, not specified as traumatic Yes     PLAN:     Surgical procedure planned: Open rotator cuff repair left shoulder with possible patch graft  Risk of aspiration  Patient will be in a sling for 6 weeks and then can start physical therapy  The procedure has been  fully reviewed with the patient; The risks and benefits of surgery have been discussed and explained and understood. Alternative treatment has also been reviewed, questions were encouraged and answered. The postoperative plan is also been reviewed.  Nonsurgical treatment as described in the history and physical section was attempted and unsuccessful and the patient has agreed to proceed with surgical intervention to improve their situation.  No orders of the defined types were placed in this encounter.   Arther Abbott, MD 10/14/2019 1:57 PM

## 2019-10-14 NOTE — Patient Instructions (Signed)
Surgery for Rotator Cuff Tear  Rotator cuff surgery is only recommended for individuals who have experienced persistent disability for greater than 3 months of non-surgical (conservative) treatment. Surgery is not necessary but is recommended for individuals who experience difficulty completing daily activities or athletes who are unable to compete. Rotator cuff tears do not usually heal without surgical intervention. If left alone small rotator cuff tears usually become larger. Younger athletes who have a rotator cuff tear may be recommended for surgery without attempting conservative rehabilitation. The purpose of surgery is to regain function of the shoulder joint and eliminate pain associated with the injury. In addition to repairing the tendon tear, the surgery may often remove a portion of the bony roof of the shoulder (acromion) as well as the chronically thickened and inflamed membrane below the acromion (subacromial bursa). REASONS NOT TO OPERATE  Infection of the shoulder. Inability to complete a rehabilitation program. Patients who have other conditions (emotional or psychological) conditions that contribute to their shoulder condition. RISKS AND COMPLICATIONS Infection. Re-tear of the rotator cuff tendons or muscles. Shoulder stiffness and/or weakness. Inability to compete in athletics. Acromioclavicular (AC) joint pain Risks of surgery: infection, bleeding, nerve damage, or damage to surrounding tissues. TECHNIQUE There are different surgical procedures used to treat rotator cuff tears. The type of procedure depends on the extent of injury as well as the surgeon's preference. All of the surgical techniques for rotator cuff tears have the same goal of repairing the torn tendon, removing part of the acromion, and removing the subacromial bursa. There are two main types of procedures: arthroscopic and open incision. Arthroscopic procedures are usually completed and you go home the same day  as surgery (outpatient). These procedures use multiple small incisions in which tools and a video camera are placed to work on the shoulder. An electric shaver removes the bursa, then a power burr shaves down the portion of the acromion that places pressure on the rotator cuff. Finally the rotator cuff is sewed (sutured) back to the humeral head. Open incision procedures require a larger incision. The deltoid muscle is detached from the acromion and a ligament in the shoulder (coracoacromial) is cut in order for the surgeon to access the rotator cuff. The subacromial bursa is removed as well as part of the acromion to give the rotator cuff room to move freely. The torn tendon is then sutured to the humeral head. After the rotator cuff is repaired, then the deltoid is reattached and the incision is closed up.  RECOVERY  Post-operative care depends on the surgical technique and the preferences of your therapist. Keep the wound clean and dry for the first 10 to 14 days after surgery. Keep your shoulder and arm in the sling provided to you for as long as you have been instructed to. You will be given pain medications by your caregiver. Passive (without using muscles) shoulder movements may be begun when instructed. It is important to follow through with you rehabilitation program in order to have the best possible recovery. RETURN TO SPORTS  The rehabilitation period will depend on the sport and position you play as well as the success of the operation. The minimum recovery period is 6 months. You must have regained complete shoulder motion and strength before returning to sports. SEEK IMMEDIATE MEDICAL CARE IF:  Any medications produce adverse side effects. Any complications from surgery occur: Pain, numbness, or coldness in the extremity operated upon. Discoloration of the nail beds (they become blue or gray) of   the extremity operated upon. Signs of infections (fever, pain, inflammation, redness, or  persistent bleeding).   You have decided to proceed with rotator cuff repair surgery. You have decided not to continue with nonoperative measures such as but not limited to oral medication,   activity modification, physical therapy, bracing, or injection.  We will perform rotator cuff repair. Some of the risks associated with rotator cuff repair include but are not limited to Bleeding Infection Swelling Stiffness Blood clot Pain Re-tearing of the rotator cuff Failure of the rotator cuff to heal   If you're not comfortable with these risks and would like to continue with nonoperative treatment please let Dr. Brentley Horrell know prior to your surgery.  

## 2019-10-15 NOTE — Patient Instructions (Signed)
James Wolf  10/15/2019     @PREFPERIOPPHARMACY @   Your procedure is scheduled on  10/26/2019.  Report to [facility] at  [phone]  A.M.  Call this number if you have problems the morning of surgery:  [phone]   Remember:  Do not eat or drink after midnight.                      Take these medicines the morning of surgery with A SIP OF WATER  micardis    Do not wear jewelry, make-up or nail polish.  Do not wear lotions, powders, or perfumes. Please wear deodorant and brush your teeth.  Do not shave 48 hours prior to surgery.  Men may shave face and neck.  Do not bring valuables to the hospital.  [facility] is not responsible for any belongings or valuables.  Contacts, dentures or bridgework may not be worn into surgery.  Leave your suitcase in the car.  After surgery it may be brought to your room.  For patients admitted to the hospital, discharge time will be determined by your treatment team.  Patients discharged the day of surgery will not be allowed to drive home.   Name and phone number of your driver:   family Special instructions:   DO NOT smoke the morning of your procedure.  Please read over the following fact sheets that you were given. Anesthesia Post-op Instructions and Care and Recovery After Surgery       Surgery for Rotator Cuff Tear, Care After This sheet gives you information about how to care for yourself after your procedure. Your health care provider may also give you more specific instructions. If you have problems or questions, contact your health care provider. What can I expect after the procedure? After the procedure, it is common to have:  Swelling.  Pain.  Stiffness.  Tenderness. Follow these instructions at home: If you have a sling or a shoulder immobilizer:  Wear it as told by your health care provider. Remove it only as told by your health care provider.  Loosen it if your fingers tingle, become numb, or turn  cold and blue.  Keep it clean. Bathing  Do not take baths, swim, or use a hot tub until your health care provider approves. Ask your health care provider if you may take showers. You may only be allowed to take sponge baths.  Keep your bandage (dressing) dry until your health care provider says it can be removed.  If your sling or shoulder immobilizer is not waterproof: ? Do not let it get wet. ? Remove it when you take a bath or shower as told by your health care provider. Once the sling or shoulder immobilizer is removed, try not to move your shoulder until your health care provider says that you can. Incision care   Follow instructions from your health care provider about how to take care of your incision. Make sure you: ? Wash your hands with soap and water before and after you change your dressing. If soap and water are not available, use hand sanitizer. ? Change your dressing as told by your health care provider. ? Leave stitches (sutures), skin glue, or adhesive strips in place. These skin closures may need to stay in place for 2 weeks or longer. If adhesive strip edges start to loosen and curl up, you may trim the loose edges. Do not remove adhesive strips  completely unless your health care provider tells you to do that.  Check your incision area every day for signs of infection. Check for: ? More redness, swelling, or pain. ? More fluid or blood. ? Warmth. ? Pus or a bad smell. Managing pain, stiffness, and swelling   If directed, put ice on your shoulder area. ? Put ice in a plastic bag. ? Place a towel between your skin and the bag. ? Leave the ice on for 20 minutes, 2-3 times a day.  Move your fingers often to reduce stiffness and swelling.  Raise (elevate) your upper body on pillows when you lie down and when you sleep. ? Do not sleep on the front of your body (abdomen). ? Do not sleep on the side that your surgery was performed on. Medicines  Take  over-the-counter and prescription medicines only as told by your health care provider.  Ask your health care provider if the medicine prescribed to you: ? Requires you to avoid driving or using heavy machinery. ? Can cause constipation. You may need to take actions to prevent or treat constipation, such as:  Drink enough fluid to keep your urine pale yellow.  Take over-the-counter or prescription medicines.  Eat foods that are high in fiber, such as beans, whole grains, and fresh fruits and vegetables.  Limit foods that are high in fat and processed sugars, such as fried or sweet foods. Driving  Do not drive for 24 hours if you were given a sedative during your procedure.  Do not drive while wearing a sling or a shoulder immobilizer. Ask your health care provider when it is safe to drive. Activity  Do not use your arm to support your body weight until your health care provider says that you can.  Do not lift or hold anything with your arm until your health care provider approves.  Return to your normal activities as told by your health care provider. Ask your health care provider what activities are safe for you.  Do exercises as told by your health care provider. General instructions  Do not use any products that contain nicotine or tobacco, such as cigarettes, e-cigarettes, and chewing tobacco. These can delay healing after surgery. If you need help quitting, ask your health care provider.  Keep all follow-up visits as told by your health care provider. This is important. Contact a health care provider if:  You have a fever.  You have more redness, swelling, or pain around your incision.  You have more fluid or blood coming from your incision.  Your incision feels warm to the touch.  You have pus or a bad smell coming from your incision.  You have pain that gets worse or does not get better with medicine. Get help right away if:  You have severe pain.  You lose  feeling in your arm or hand.  Your hand or fingers turn very pale or blue. Summary  If you have a sling, wear it as told by your health care provider. Remove it only as told by your health care provider.  Change your dressing as told by your health care provider. Check the incision area every day for signs of infection.  If directed, put ice on your shoulder area 2-3 times a day.  Do not use your arm to lift anything or to support your body weight until your health care provider says that you can. This information is not intended to replace advice given to you by your health  care provider. Make sure you discuss any questions you have with your health care provider. Document Revised: 09/29/2017 Document Reviewed: 10/01/2017 Elsevier Patient Education  2020 [place] Anesthesia, Adult, Care After This sheet gives you information about how to care for yourself after your procedure. Your health care provider may also give you more specific instructions. If you have problems or questions, contact your health care provider. What can I expect after the procedure? After the procedure, the following side effects are common:  Pain or discomfort at the IV site.  Nausea.  Vomiting.  Sore throat.  Trouble concentrating.  Feeling cold or chills.  Weak or tired.  Sleepiness and fatigue.  Soreness and body aches. These side effects can affect parts of the body that were not involved in surgery. Follow these instructions at home:  For at least 24 hours after the procedure:  Have a responsible adult stay with you. It is important to have someone help care for you until you are awake and alert.  Rest as needed.  Do not: ? Participate in activities in which you could fall or become injured. ? Drive. ? Use heavy machinery. ? Drink alcohol. ? Take sleeping pills or medicines that cause drowsiness. ? Make important decisions or sign legal documents. ? Take care of children  on your own. Eating and drinking  Follow any instructions from your health care provider about eating or drinking restrictions.  When you feel hungry, start by eating small amounts of foods that are soft and easy to digest (bland), such as toast. Gradually return to your regular diet.  Drink enough fluid to keep your urine pale yellow.  If you vomit, rehydrate by drinking water, juice, or clear broth. General instructions  If you have sleep apnea, surgery and certain medicines can increase your risk for breathing problems. Follow instructions from your health care provider about wearing your sleep device: ? Anytime you are sleeping, including during daytime naps. ? While taking prescription pain medicines, sleeping medicines, or medicines that make you drowsy.  Return to your normal activities as told by your health care provider. Ask your health care provider what activities are safe for you.  Take over-the-counter and prescription medicines only as told by your health care provider.  If you smoke, do not smoke without supervision.  Keep all follow-up visits as told by your health care provider. This is important. Contact a health care provider if:  You have nausea or vomiting that does not get better with medicine.  You cannot eat or drink without vomiting.  You have pain that does not get better with medicine.  You are unable to pass urine.  You develop a skin rash.  You have a fever.  You have redness around your IV site that gets worse. Get help right away if:  You have difficulty breathing.  You have chest pain.  You have blood in your urine or stool, or you vomit blood. Summary  After the procedure, it is common to have a sore throat or nausea. It is also common to feel tired.  Have a responsible adult stay with you for the first 24 hours after general anesthesia. It is important to have someone help care for you until you are awake and alert.  When you feel  hungry, start by eating small amounts of foods that are soft and easy to digest (bland), such as toast. Gradually return to your regular diet.  Drink enough fluid to keep your urine  pale yellow.  Return to your normal activities as told by your health care provider. Ask your health care provider what activities are safe for you. This information is not intended to replace advice given to you by your health care provider. Make sure you discuss any questions you have with your health care provider. Document Revised: 12/27/2016 Document Reviewed: 08/09/2016 Elsevier Patient Education  [place]. How to Use Chlorhexidine for Bathing Chlorhexidine gluconate (CHG) is a germ-killing (antiseptic) solution that is used to clean the skin. It can get rid of the bacteria that normally live on the skin and can keep them away for about 24 hours. To clean your skin with CHG, you may be given:  A CHG solution to use in the shower or as part of a sponge bath.  A prepackaged cloth that contains CHG. Cleaning your skin with CHG may help lower the risk for infection:  While you are staying in the intensive care unit of the hospital.  If you have a vascular access, such as a central line, to provide short-term or long-term access to your veins.  If you have a catheter to drain urine from your bladder.  If you are on a ventilator. A ventilator is a machine that helps you breathe by moving air in and out of your lungs.  After surgery. What are the risks? Risks of using CHG include:  A skin reaction.  Hearing loss, if CHG gets in your ears.  Eye injury, if CHG gets in your eyes and is not rinsed out.  The CHG product catching fire. Make sure that you avoid smoking and flames after applying CHG to your skin. Do not use CHG:  If you have a chlorhexidine allergy or have previously reacted to chlorhexidine.  On babies younger than 54 months of age. How to use CHG solution  Use CHG only as told  by your health care provider, and follow the instructions on the label.  Use the full amount of CHG as directed. Usually, this is one bottle. During a shower Follow these steps when using CHG solution during a shower (unless your health care provider gives you different instructions): 1. Start the shower. 2. Use your normal soap and shampoo to wash your face and hair. 3. Turn off the shower or move out of the shower stream. 4. Pour the CHG onto a clean washcloth. Do not use any type of brush or rough-edged sponge. 5. Starting at your neck, lather your body down to your toes. Make sure you follow these instructions: ? If you will be having surgery, pay special attention to the part of your body where you will be having surgery. Scrub this area for at least 1 minute. ? Do not use CHG on your head or face. If the solution gets into your ears or eyes, rinse them well with water. ? Avoid your genital area. ? Avoid any areas of skin that have broken skin, cuts, or scrapes. ? Scrub your back and under your arms. Make sure to wash skin folds. 6. Let the lather sit on your skin for 1-2 minutes or as long as told by your health care provider. 7. Thoroughly rinse your entire body in the shower. Make sure that all body creases and crevices are rinsed well. 8. Dry off with a clean towel. Do not put any substances on your body afterward--such as powder, lotion, or perfume--unless you are told to do so by your health care provider. Only use lotions that  are recommended by the manufacturer. 9. Put on clean clothes or pajamas. 10. If it is the night before your surgery, sleep in clean sheets.  During a sponge bath Follow these steps when using CHG solution during a sponge bath (unless your health care provider gives you different instructions): 1. Use your normal soap and shampoo to wash your face and hair. 2. Pour the CHG onto a clean washcloth. 3. Starting at your neck, lather your body down to your toes.  Make sure you follow these instructions: ? If you will be having surgery, pay special attention to the part of your body where you will be having surgery. Scrub this area for at least 1 minute. ? Do not use CHG on your head or face. If the solution gets into your ears or eyes, rinse them well with water. ? Avoid your genital area. ? Avoid any areas of skin that have broken skin, cuts, or scrapes. ? Scrub your back and under your arms. Make sure to wash skin folds. 4. Let the lather sit on your skin for 1-2 minutes or as long as told by your health care provider. 5. Using a different clean, wet washcloth, thoroughly rinse your entire body. Make sure that all body creases and crevices are rinsed well. 6. Dry off with a clean towel. Do not put any substances on your body afterward--such as powder, lotion, or perfume--unless you are told to do so by your health care provider. Only use lotions that are recommended by the manufacturer. 7. Put on clean clothes or pajamas. 8. If it is the night before your surgery, sleep in clean sheets. How to use CHG prepackaged cloths  Only use CHG cloths as told by your health care provider, and follow the instructions on the label.  Use the CHG cloth on clean, dry skin.  Do not use the CHG cloth on your head or face unless your health care provider tells you to.  When washing with the CHG cloth: ? Avoid your genital area. ? Avoid any areas of skin that have broken skin, cuts, or scrapes. Before surgery Follow these steps when using a CHG cloth to clean before surgery (unless your health care provider gives you different instructions): 1. Using the CHG cloth, vigorously scrub the part of your body where you will be having surgery. Scrub using a back-and-forth motion for 3 minutes. The area on your body should be completely wet with CHG when you are done scrubbing. 2. Do not rinse. Discard the cloth and let the area air-dry. Do not put any substances on the area  afterward, such as powder, lotion, or perfume. 3. Put on clean clothes or pajamas. 4. If it is the night before your surgery, sleep in clean sheets.  For general bathing Follow these steps when using CHG cloths for general bathing (unless your health care provider gives you different instructions). 1. Use a separate CHG cloth for each area of your body. Make sure you wash between any folds of skin and between your fingers and toes. Wash your body in the following order, switching to a new cloth after each step: ? The front of your neck, shoulders, and chest. ? Both of your arms, under your arms, and your hands. ? Your stomach and groin area, avoiding the genitals. ? Your right leg and foot. ? Your left leg and foot. ? The back of your neck, your back, and your buttocks. 2. Do not rinse. Discard the cloth and let the  area air-dry. Do not put any substances on your body afterward--such as powder, lotion, or perfume--unless you are told to do so by your health care provider. Only use lotions that are recommended by the manufacturer. 3. Put on clean clothes or pajamas. Contact a health care provider if:  Your skin gets irritated after scrubbing.  You have questions about using your solution or cloth. Get help right away if:  Your eyes become very red or swollen.  Your eyes itch badly.  Your skin itches badly and is red or swollen.  Your hearing changes.  You have trouble seeing.  You have swelling or tingling in your mouth or throat.  You have trouble breathing.  You swallow any chlorhexidine. Summary  Chlorhexidine gluconate (CHG) is a germ-killing (antiseptic) solution that is used to clean the skin. Cleaning your skin with CHG may help to lower your risk for infection.  You may be given CHG to use for bathing. It may be in a bottle or in a prepackaged cloth to use on your skin. Carefully follow your health care provider's instructions and the instructions on the product  label.  Do not use CHG if you have a chlorhexidine allergy.  Contact your health care provider if your skin gets irritated after scrubbing. This information is not intended to replace advice given to you by your health care provider. Make sure you discuss any questions you have with your health care provider. Document Revised: 03/12/2018 Document Reviewed: 11/21/2016 Elsevier Patient Education  [place].

## 2019-10-19 ENCOUNTER — Other Ambulatory Visit: Payer: Self-pay

## 2019-10-19 ENCOUNTER — Encounter (HOSPITAL_COMMUNITY): Payer: Self-pay

## 2019-10-19 ENCOUNTER — Encounter (HOSPITAL_COMMUNITY)
Admission: RE | Admit: 2019-10-19 | Discharge: 2019-10-19 | Disposition: A | Discharge status: Home / Self Care | Payer: BC Managed Care – PPO | Source: Ambulatory Visit | Attending: Orthopedic Surgery | Admitting: Orthopedic Surgery

## 2019-10-19 DIAGNOSIS — Z01812 Encounter for preprocedural laboratory examination: Secondary | ICD-10-CM | POA: Insufficient documentation

## 2019-10-19 HISTORY — DX: Personal history of urinary calculi: Z87.442

## 2019-10-19 LAB — CBC WITH DIFFERENTIAL/PLATELET
Abs Immature Granulocytes: 0.03 10*3/uL (ref 0.00–0.07)
Basophils Absolute: 0 10*3/uL (ref 0.0–0.1)
Basophils Relative: 1 %
Eosinophils Absolute: 0.1 10*3/uL (ref 0.0–0.5)
Eosinophils Relative: 2 %
HCT: 39.6 % (ref 39.0–52.0)
Hemoglobin: 12.9 g/dL — ABNORMAL LOW (ref 13.0–17.0)
Immature Granulocytes: 1 %
Lymphocytes Relative: 21 %
Lymphs Abs: 1.2 10*3/uL (ref 0.7–4.0)
MCH: 30.5 pg (ref 26.0–34.0)
MCHC: 32.6 g/dL (ref 30.0–36.0)
MCV: 93.6 fL (ref 80.0–100.0)
Monocytes Absolute: 0.5 10*3/uL (ref 0.1–1.0)
Monocytes Relative: 8 %
Neutro Abs: 3.9 10*3/uL (ref 1.7–7.7)
Neutrophils Relative %: 67 %
Platelets: 221 10*3/uL (ref 150–400)
RBC: 4.23 MIL/uL (ref 4.22–5.81)
RDW: 13.4 % (ref 11.5–15.5)
WBC: 5.8 10*3/uL (ref 4.0–10.5)
nRBC: 0 % (ref 0.0–0.2)

## 2019-10-19 LAB — BASIC METABOLIC PANEL
Anion gap: 8 (ref 5–15)
BUN: 11 mg/dL (ref 8–23)
CO2: 26 mmol/L (ref 22–32)
Calcium: 8.7 mg/dL — ABNORMAL LOW (ref 8.9–10.3)
Chloride: 105 mmol/L (ref 98–111)
Creatinine, Ser: 1.07 mg/dL (ref 0.61–1.24)
GFR, Estimated: >60 mL/min (ref >60)
Glucose, Bld: 117 mg/dL — ABNORMAL HIGH (ref 70–99)
Potassium: 3.9 mmol/L (ref 3.5–5.1)
Sodium: 139 mmol/L (ref 135–145)

## 2019-10-25 ENCOUNTER — Telehealth: Payer: Self-pay | Admitting: Radiology

## 2019-10-25 ENCOUNTER — Other Ambulatory Visit (HOSPITAL_COMMUNITY)
Admission: RE | Admit: 2019-10-25 | Discharge: 2019-10-25 | Disposition: A | Discharge status: Home / Self Care | Payer: BC Managed Care – PPO | Source: Ambulatory Visit | Attending: Orthopedic Surgery | Admitting: Orthopedic Surgery

## 2019-10-25 ENCOUNTER — Other Ambulatory Visit: Payer: Self-pay

## 2019-10-25 DIAGNOSIS — Z20822 Contact with and (suspected) exposure to covid-19: Secondary | ICD-10-CM | POA: Diagnosis not present

## 2019-10-25 DIAGNOSIS — Z01812 Encounter for preprocedural laboratory examination: Secondary | ICD-10-CM | POA: Insufficient documentation

## 2019-10-25 LAB — SARS CORONAVIRUS 2 (TAT 6-24 HRS): SARS Coronavirus 2: NEGATIVE

## 2019-10-25 NOTE — H&P (Signed)
[place]       Chief Complaint  Patient presents with  . Shoulder Pain      left shoulder surgical consult       69 yo male referred to me by Dr Luna Glasgow for possible surgery on left shoulder;   James Wolf was treated with prednisone injection and physical therapy for chronic shoulder pain which started back in March with no history of trauma.  He had an MRI which showed he has a partial articular surface tear of his supraspinatus.  He complains of pain weakness and loss of motion in his left shoulder.  He has a history of esophageal cancer and a complication of that surgery included severe reflux when the patient is lying flat in the supine position.             Review of Systems  Constitutional: Negative for chills and fever.  Respiratory: Negative for shortness of breath.   Cardiovascular: Negative for chest pain.  Gastrointestinal: Positive for heartburn.  All other systems reviewed and are negative.           Past Medical History:  Diagnosis Date  . Anemia    . Aspiration pneumonia ([place])    . Cancer ([place])      esophageal cancer 1995  . Chest pain    . COPD (chronic obstructive pulmonary disease) ([place])    . GERD (gastroesophageal reflux disease)    . Hyperlipidemia    . Hypertension    . Joint pain    . Osteomyelitis ([place])    . Osteoporosis    . Rheumatic fever             Past Surgical History:  Procedure Laterality Date  . partial esophageal ejectory          No family history on file. Social History         Tobacco Use  . Smoking status: Former Smoker      Quit date: 02/19/1993      Years since quitting: 26.6  . Smokeless tobacco: Never Used  Substance Use Topics  . Alcohol use: Not Currently      Comment: occ  . Drug use: Never           Allergies  Allergen Reactions  . Zithromax [Azithromycin] Diarrhea      Causes pt's stomach to "tear up" does not want to take medication again.         Active Medications      Current Meds   Medication Sig  . B Complex Vitamins (VITAMIN B COMPLEX PO) Take 1 tablet by mouth 2 (two) times daily.  . diclofenac sodium (VOLTAREN) 1 % GEL 4 GRAM ON SKIN 4 TIMES A DAY AS NEEDED  . esomeprazole (NEXIUM) 40 MG capsule Take 1 capsule by mouth 2 (two) times daily.  Marland Kitchen telmisartan (MICARDIS) 20 MG tablet Take 1 tablet by mouth daily.  . Vitamin D, Ergocalciferol, (DRISDOL) 50000 units CAPS capsule Take by mouth.         BP (!) 143/87   Pulse 94   Ht 5' 5.5" (1.664 m)   Wt 173 lb (78.5 kg)   BMI 28.35 kg/m    Physical Exam Constitutional:      General: He is not in acute distress.    Appearance: He is well-developed.  Cardiovascular:     Comments: No peripheral edema Skin:    General: Skin is warm and dry.  Neurological:     Mental Status: He  is alert and oriented to person, place, and time.     Sensory: No sensory deficit.     Coordination: Coordination normal.     Gait: Gait normal.     Deep Tendon Reflexes: Reflexes are normal and symmetric.       Ortho Exam   Left shoulder: Tenderness in the rotator interval anterior shoulder joint   Active motion: Flexion 150 degrees  External rotation 50 degrees  Abduction 100 degrees    Passive motion flexion 150 degrees  External rotation 50 degrees  Abduction 100 degrees    Drop test was normal  Empty can sign mild weakness grade 4 supraspinatus normal 5 out of 5 infraspinatus and external rotation    Impingement sign was +820 degrees of flexion    MEDICAL DECISION SECTION      My independent reading of xrays: Plain films show no arthritis   MRI shows a tear at the watershed region with a thin wisp of tissue on the bursal side, undersurface tear primarily supraspinatus tendon         Encounter Diagnosis  Name Primary?  . Incomplete rotator cuff tear or rupture of left shoulder, not specified as traumatic Yes        PLAN:        Surgical procedure planned: Open rotator cuff repair left shoulder with  possible patch graft   Risk of aspiration   Patient will be in a sling for 6 weeks and then can start physical therapy   The procedure has been fully reviewed with the patient; The risks and benefits of surgery have been discussed and explained and understood. Alternative treatment has also been reviewed, questions were encouraged and answered. The postoperative plan is also been reviewed.   Nonsurgical treatment as described in the history and physical section was attempted and unsuccessful and the patient has agreed to proceed with surgical intervention to improve their situation.   No orders of the defined types were placed in this encounter.     Arther Abbott, MD 10/14/2019

## 2019-10-25 NOTE — Telephone Encounter (Signed)
Called [employer] no authorization required for the [place] Reference number for the call is [id number]

## 2019-10-26 ENCOUNTER — Encounter (HOSPITAL_COMMUNITY): Payer: Self-pay | Admitting: Orthopedic Surgery

## 2019-10-26 ENCOUNTER — Ambulatory Visit (HOSPITAL_COMMUNITY): Payer: BC Managed Care – PPO | Admitting: Certified Registered Nurse Anesthetist

## 2019-10-26 ENCOUNTER — Encounter (HOSPITAL_COMMUNITY)
Admission: RE | Disposition: A | Discharge status: Home / Self Care | Payer: Self-pay | Source: Home / Self Care | Attending: Orthopedic Surgery

## 2019-10-26 ENCOUNTER — Ambulatory Visit (HOSPITAL_COMMUNITY)
Admission: RE | Admit: 2019-10-26 | Discharge: 2019-10-26 | Disposition: A | Discharge status: Home / Self Care | Payer: BC Managed Care – PPO | Attending: Orthopedic Surgery | Admitting: Orthopedic Surgery

## 2019-10-26 DIAGNOSIS — Z87891 Personal history of nicotine dependence: Secondary | ICD-10-CM | POA: Insufficient documentation

## 2019-10-26 DIAGNOSIS — Z8501 Personal history of malignant neoplasm of esophagus: Secondary | ICD-10-CM | POA: Diagnosis not present

## 2019-10-26 DIAGNOSIS — K219 Gastro-esophageal reflux disease without esophagitis: Secondary | ICD-10-CM | POA: Diagnosis not present

## 2019-10-26 DIAGNOSIS — J449 Chronic obstructive pulmonary disease, unspecified: Secondary | ICD-10-CM | POA: Diagnosis not present

## 2019-10-26 DIAGNOSIS — Z79899 Other long term (current) drug therapy: Secondary | ICD-10-CM | POA: Insufficient documentation

## 2019-10-26 DIAGNOSIS — M75112 Incomplete rotator cuff tear or rupture of left shoulder, not specified as traumatic: Secondary | ICD-10-CM

## 2019-10-26 DIAGNOSIS — I10 Essential (primary) hypertension: Secondary | ICD-10-CM | POA: Insufficient documentation

## 2019-10-26 DIAGNOSIS — Z881 Allergy status to other antibiotic agents status: Secondary | ICD-10-CM | POA: Diagnosis not present

## 2019-10-26 HISTORY — PX: SHOULDER OPEN ROTATOR CUFF REPAIR: SHX2407

## 2019-10-26 LAB — GLUCOSE, CAPILLARY: Glucose-Capillary: 95 mg/dL (ref 70–99)

## 2019-10-26 SURGERY — REPAIR, ROTATOR CUFF, OPEN
Anesthesia: General | Site: Shoulder | Laterality: Left

## 2019-10-26 MED ORDER — KETAMINE HCL 10 MG/ML IJ SOLN
INTRAMUSCULAR | Status: DC | PRN
  Administered 2019-10-26: 50 mg via INTRAVENOUS

## 2019-10-26 MED ORDER — BUPIVACAINE-EPINEPHRINE (PF) 0.5% -1:200000 IJ SOLN
INTRAMUSCULAR | Status: AC
  Filled 2019-10-26: qty 60

## 2019-10-26 MED ORDER — EPHEDRINE SULFATE 50 MG/ML IJ SOLN
INTRAMUSCULAR | Status: DC | PRN
  Administered 2019-10-26: 10 mg via INTRAVENOUS

## 2019-10-26 MED ORDER — MIDAZOLAM HCL 2 MG/2ML IJ SOLN
INTRAMUSCULAR | Status: AC
  Filled 2019-10-26: qty 2

## 2019-10-26 MED ORDER — HYDROMORPHONE HCL 1 MG/ML IJ SOLN: 0.2500 mg | INTRAMUSCULAR | Status: DC | PRN

## 2019-10-26 MED ORDER — METHOCARBAMOL 1000 MG/10ML IJ SOLN
500.0000 mg | Freq: Once | INTRAVENOUS | Status: AC
  Administered 2019-10-26: 500 mg via INTRAVENOUS
  Filled 2019-10-26: qty 5

## 2019-10-26 MED ORDER — ONDANSETRON HCL 4 MG/2ML IJ SOLN: 4.0000 mg | Freq: Once | INTRAMUSCULAR | Status: DC | PRN

## 2019-10-26 MED ORDER — ACETAMINOPHEN 10 MG/ML IV SOLN
INTRAVENOUS | Status: DC | PRN
  Administered 2019-10-26: 1000 mg via INTRAVENOUS

## 2019-10-26 MED ORDER — PROPOFOL 10 MG/ML IV BOLUS
INTRAVENOUS | Status: AC
  Filled 2019-10-26: qty 40

## 2019-10-26 MED ORDER — KETAMINE HCL 50 MG/5ML IJ SOSY
PREFILLED_SYRINGE | INTRAMUSCULAR | Status: AC
  Filled 2019-10-26: qty 5

## 2019-10-26 MED ORDER — BUPIVACAINE LIPOSOME 1.3 % IJ SUSP
INTRAMUSCULAR | Status: DC | PRN
  Administered 2019-10-26: 20 mL

## 2019-10-26 MED ORDER — FENTANYL CITRATE (PF) 100 MCG/2ML IJ SOLN
INTRAMUSCULAR | Status: AC
  Filled 2019-10-26: qty 2

## 2019-10-26 MED ORDER — PHENYLEPHRINE HCL (PRESSORS) 10 MG/ML IV SOLN
INTRAVENOUS | Status: DC | PRN
  Administered 2019-10-26 (×2): 120 ug via INTRAVENOUS
  Administered 2019-10-26: 40 ug via INTRAVENOUS
  Administered 2019-10-26 (×2): 120 ug via INTRAVENOUS
  Administered 2019-10-26 (×2): 80 ug via INTRAVENOUS

## 2019-10-26 MED ORDER — LACTATED RINGERS IV SOLN
INTRAVENOUS | Status: DC | PRN
  Administered 2019-10-26 (×2): 1000 mL via INTRAVENOUS

## 2019-10-26 MED ORDER — ROCURONIUM BROMIDE 10 MG/ML (PF) SYRINGE
PREFILLED_SYRINGE | INTRAVENOUS | Status: AC
  Filled 2019-10-26: qty 10

## 2019-10-26 MED ORDER — ROPIVACAINE HCL 5 MG/ML IJ SOLN
INTRAMUSCULAR | Status: AC
  Filled 2019-10-26: qty 30

## 2019-10-26 MED ORDER — ONDANSETRON HCL 4 MG/2ML IJ SOLN
INTRAMUSCULAR | Status: DC | PRN
  Administered 2019-10-26: 4 mg via INTRAVENOUS

## 2019-10-26 MED ORDER — HYDROCODONE-ACETAMINOPHEN 7.5-325 MG PO TABS
1.0000 | ORAL_TABLET | ORAL | 0 refills | Status: DC | PRN
Start: 2019-10-26 — End: 2020-01-17

## 2019-10-26 MED ORDER — FENTANYL CITRATE (PF) 100 MCG/2ML IJ SOLN
INTRAMUSCULAR | Status: DC | PRN
  Administered 2019-10-26 (×2): 50 ug via INTRAVENOUS
  Administered 2019-10-26: 100 ug via INTRAVENOUS
  Administered 2019-10-26: 50 ug via INTRAVENOUS

## 2019-10-26 MED ORDER — SODIUM CHLORIDE 0.9 % IV SOLN
INTRAVENOUS | Status: DC | PRN
  Administered 2019-10-26: 30 ug/min via INTRAVENOUS

## 2019-10-26 MED ORDER — ORAL CARE MOUTH RINSE: 15.0000 mL | Freq: Once | OROMUCOSAL | Status: AC

## 2019-10-26 MED ORDER — EPHEDRINE 5 MG/ML INJ
INTRAVENOUS | Status: AC
  Filled 2019-10-26: qty 10

## 2019-10-26 MED ORDER — SUGAMMADEX SODIUM 200 MG/2ML IV SOLN
INTRAVENOUS | Status: DC | PRN
  Administered 2019-10-26 (×4): 50 mg via INTRAVENOUS

## 2019-10-26 MED ORDER — MIDAZOLAM HCL 5 MG/5ML IJ SOLN
INTRAMUSCULAR | Status: DC | PRN
  Administered 2019-10-26: 2 mg via INTRAVENOUS

## 2019-10-26 MED ORDER — HYDROCODONE-ACETAMINOPHEN 5-325 MG PO TABS
1.0000 | ORAL_TABLET | Freq: Once | ORAL | Status: AC
  Administered 2019-10-26: 1 via ORAL
  Filled 2019-10-26: qty 1

## 2019-10-26 MED ORDER — ROCURONIUM BROMIDE 10 MG/ML (PF) SYRINGE
PREFILLED_SYRINGE | INTRAVENOUS | Status: DC | PRN
  Administered 2019-10-26: 70 mg via INTRAVENOUS
  Administered 2019-10-26 (×2): 10 mg via INTRAVENOUS

## 2019-10-26 MED ORDER — GLYCOPYRROLATE PF 0.2 MG/ML IJ SOSY
PREFILLED_SYRINGE | INTRAMUSCULAR | Status: AC
  Filled 2019-10-26: qty 1

## 2019-10-26 MED ORDER — PROMETHAZINE HCL 12.5 MG PO TABS: 12.5000 mg | ORAL_TABLET | Freq: Four times a day (QID) | ORAL | 0 refills | Status: DC | PRN

## 2019-10-26 MED ORDER — PROPOFOL 10 MG/ML IV BOLUS
INTRAVENOUS | Status: DC | PRN
  Administered 2019-10-26 (×2): 50 mg via INTRAVENOUS
  Administered 2019-10-26: 20 mg via INTRAVENOUS

## 2019-10-26 MED ORDER — IBUPROFEN 800 MG PO TABS: 800.0000 mg | ORAL_TABLET | Freq: Three times a day (TID) | ORAL | 1 refills | Status: DC | PRN

## 2019-10-26 MED ORDER — PHENYLEPHRINE HCL (PRESSORS) 10 MG/ML IV SOLN
INTRAVENOUS | Status: AC
  Filled 2019-10-26: qty 1

## 2019-10-26 MED ORDER — CHLORHEXIDINE GLUCONATE 0.12 % MT SOLN
OROMUCOSAL | Status: AC
  Filled 2019-10-26: qty 45

## 2019-10-26 MED ORDER — CEFAZOLIN SODIUM-DEXTROSE 2-4 GM/100ML-% IV SOLN
INTRAVENOUS | Status: AC
  Filled 2019-10-26: qty 100

## 2019-10-26 MED ORDER — BUPIVACAINE LIPOSOME 1.3 % IJ SUSP
INTRAMUSCULAR | Status: AC
  Filled 2019-10-26: qty 20

## 2019-10-26 MED ORDER — CELECOXIB 400 MG PO CAPS
400.0000 mg | ORAL_CAPSULE | Freq: Once | ORAL | Status: AC
  Administered 2019-10-26: 400 mg via ORAL
  Filled 2019-10-26: qty 1

## 2019-10-26 MED ORDER — CHLORHEXIDINE GLUCONATE 0.12 % MT SOLN
15.0000 mL | Freq: Once | OROMUCOSAL | Status: AC
  Administered 2019-10-26: 15 mL via OROMUCOSAL

## 2019-10-26 MED ORDER — LIDOCAINE 2% (20 MG/ML) 5 ML SYRINGE
INTRAMUSCULAR | Status: AC
  Filled 2019-10-26: qty 5

## 2019-10-26 MED ORDER — PREGABALIN 50 MG PO CAPS
50.0000 mg | ORAL_CAPSULE | Freq: Once | ORAL | Status: AC
  Administered 2019-10-26: 50 mg via ORAL
  Filled 2019-10-26: qty 1

## 2019-10-26 MED ORDER — DEXAMETHASONE SODIUM PHOSPHATE 10 MG/ML IJ SOLN
INTRAMUSCULAR | Status: AC
  Filled 2019-10-26: qty 1

## 2019-10-26 MED ORDER — 0.9 % SODIUM CHLORIDE (POUR BTL) OPTIME
TOPICAL | Status: DC | PRN
  Administered 2019-10-26: 1000 mL

## 2019-10-26 MED ORDER — DEXAMETHASONE SODIUM PHOSPHATE 4 MG/ML IJ SOLN
INTRAMUSCULAR | Status: AC
  Filled 2019-10-26: qty 1

## 2019-10-26 MED ORDER — GLYCOPYRROLATE PF 0.2 MG/ML IJ SOSY
PREFILLED_SYRINGE | INTRAMUSCULAR | Status: DC | PRN
  Administered 2019-10-26: .2 mg via INTRAVENOUS

## 2019-10-26 MED ORDER — CEFAZOLIN SODIUM-DEXTROSE 2-4 GM/100ML-% IV SOLN
2.0000 g | INTRAVENOUS | Status: AC
  Administered 2019-10-26: 2 g via INTRAVENOUS

## 2019-10-26 MED ORDER — LACTATED RINGERS IV SOLN: INTRAVENOUS | Status: DC

## 2019-10-26 MED ORDER — TIZANIDINE HCL 4 MG PO TABS: 4.0000 mg | ORAL_TABLET | Freq: Three times a day (TID) | ORAL | 1 refills | Status: AC

## 2019-10-26 MED ORDER — ONDANSETRON HCL 4 MG/2ML IJ SOLN
4.0000 mg | Freq: Once | INTRAMUSCULAR | Status: AC
  Administered 2019-10-26: 4 mg via INTRAVENOUS
  Filled 2019-10-26: qty 2

## 2019-10-26 MED ORDER — DEXAMETHASONE SODIUM PHOSPHATE 10 MG/ML IJ SOLN
INTRAMUSCULAR | Status: DC | PRN
  Administered 2019-10-26: 4 mg via INTRAVENOUS
  Administered 2019-10-26: 8 mg via INTRAVENOUS

## 2019-10-26 MED ORDER — ACETAMINOPHEN 10 MG/ML IV SOLN
INTRAVENOUS | Status: AC
  Filled 2019-10-26: qty 100

## 2019-10-26 MED ORDER — PHENYLEPHRINE 40 MCG/ML (10ML) SYRINGE FOR IV PUSH (FOR BLOOD PRESSURE SUPPORT)
PREFILLED_SYRINGE | INTRAVENOUS | Status: AC
  Filled 2019-10-26: qty 20

## 2019-10-26 SURGICAL SUPPLY — 54 items
ANCH SUT SWLK 19.1X4.75 (Anchor) ×2 IMPLANT
ANCHOR SUT BIO SW 4.75X19.1 (Anchor) ×6 IMPLANT
APL SKNCLS STERI-STRIP NONHPOA (GAUZE/BANDAGES/DRESSINGS) ×1
BENZOIN TINCTURE PRP APPL 2/3 (GAUZE/BANDAGES/DRESSINGS) ×3 IMPLANT
BIT DRILL 2.0X128 (BIT) ×2 IMPLANT
BIT DRILL 2.0X128MM (BIT) ×1
BLADE HEX COATED 2.75 (ELECTRODE) ×3 IMPLANT
BLADE OSC/SAGITTAL MD 9X18.5 (BLADE) ×3 IMPLANT
CLOSURE STERI-STRIP 1/2X4 (GAUZE/BANDAGES/DRESSINGS) ×1
CLOSURE WOUND 1/2 X4 (GAUZE/BANDAGES/DRESSINGS) ×1
CLOTH BEACON ORANGE TIMEOUT ST (SAFETY) ×3 IMPLANT
CLSR STERI-STRIP ANTIMIC 1/2X4 (GAUZE/BANDAGES/DRESSINGS) ×2 IMPLANT
COVER LIGHT HANDLE STERIS (MISCELLANEOUS) ×6 IMPLANT
COVER WAND RF STERILE (DRAPES) ×3 IMPLANT
DRAPE ORTHO 2.5IN SPLIT 77X108 (DRAPES) ×2 IMPLANT
DRAPE ORTHO SPLIT 77X108 STRL (DRAPES) ×6
DRESSING MEPILEX BORDER 6X8 (GAUZE/BANDAGES/DRESSINGS) ×1 IMPLANT
DRSG MEPILEX BORDER 6X8 (GAUZE/BANDAGES/DRESSINGS) ×3
DURAPREP 26ML APPLICATOR (WOUND CARE) ×3 IMPLANT
ELECT REM PT RETURN 9FT ADLT (ELECTROSURGICAL) ×3
ELECTRODE REM PT RTRN 9FT ADLT (ELECTROSURGICAL) ×1 IMPLANT
GLOVE BIO SURGEON STRL SZ7 (GLOVE) ×3 IMPLANT
GLOVE BIOGEL PI IND STRL 7.0 (GLOVE) ×3 IMPLANT
GLOVE BIOGEL PI IND STRL 7.5 (GLOVE) ×1 IMPLANT
GLOVE BIOGEL PI INDICATOR 7.0 (GLOVE) ×6
GLOVE BIOGEL PI INDICATOR 7.5 (GLOVE) ×2
GLOVE ECLIPSE 6.5 STRL STRAW (GLOVE) ×3 IMPLANT
GLOVE SKINSENSE NS SZ8.0 LF (GLOVE) ×2
GLOVE SKINSENSE STRL SZ8.0 LF (GLOVE) ×1 IMPLANT
GLOVE SS N UNI LF 8.5 STRL (GLOVE) ×3 IMPLANT
GOWN STRL REUS W/ TWL XL LVL3 (GOWN DISPOSABLE) ×2 IMPLANT
GOWN STRL REUS W/TWL LRG LVL3 (GOWN DISPOSABLE) ×6 IMPLANT
GOWN STRL REUS W/TWL XL LVL3 (GOWN DISPOSABLE) ×9 IMPLANT
INST SET MINOR BONE (KITS) ×3 IMPLANT
KIT BLADEGUARD II DBL (SET/KITS/TRAYS/PACK) ×3 IMPLANT
KIT TURNOVER KIT A (KITS) ×3 IMPLANT
MANIFOLD NEPTUNE II (INSTRUMENTS) ×3 IMPLANT
MARKER SKIN DUAL TIP RULER LAB (MISCELLANEOUS) ×3 IMPLANT
NEEDLE HYPO 21X1.5 SAFETY (NEEDLE) ×3 IMPLANT
NEEDLE MAYO 6 CRC TAPER PT (NEEDLE) ×3 IMPLANT
NS IRRIG 1000ML POUR BTL (IV SOLUTION) ×3 IMPLANT
PACK TOTAL JOINT (CUSTOM PROCEDURE TRAY) ×3 IMPLANT
PAD ARMBOARD 7.5X6 YLW CONV (MISCELLANEOUS) ×3 IMPLANT
PENCIL SMOKE EVACUATOR (MISCELLANEOUS) ×3 IMPLANT
RASP SM TEAR CROSS CUT (RASP) ×3 IMPLANT
SET BASIN LINEN APH (SET/KITS/TRAYS/PACK) ×3 IMPLANT
SLING ARM IMMOBILIZER LRG (SOFTGOODS) ×3 IMPLANT
STRIP CLOSURE SKIN 1/2X4 (GAUZE/BANDAGES/DRESSINGS) ×2 IMPLANT
SUT ETHIBOND NAB OS 4 #2 30IN (SUTURE) ×6 IMPLANT
SUT MON AB 0 CT1 (SUTURE) ×6 IMPLANT
SUT MON AB 2-0 CT1 36 (SUTURE) ×3 IMPLANT
SUT TIGER TAPE 7 IN WHITE (SUTURE) ×3 IMPLANT
SYR BULB IRRIG 60ML STRL (SYRINGE) ×3 IMPLANT
YANKAUER SUCT 12FT TUBE ARGYLE (SUCTIONS) ×3 IMPLANT

## 2019-10-26 NOTE — Anesthesia Procedure Notes (Addendum)
Anesthesia Regional Block: Interscalene brachial plexus block   Pre-Anesthetic Checklist: ,, timeout performed, Correct Patient, Correct Site, Correct Laterality, Correct Procedure, Correct Position, site marked, Risks and benefits discussed,  Surgical consent,  Pre-op evaluation,  At surgeon's request and post-op pain management  Laterality: Left  Prep: chloraprep       Needles:  Injection technique: Single-shot  Needle Type: Stimulator Needle - 40     Needle Length: 5cm  Needle Gauge: 22     Additional Needles:   Procedures:, nerve stimulator,,, ultrasound used (permanent image in chart),,,,   Nerve Stimulator or Paresthesia:  Response: Thenar Twitch, 0.6 mA,   Additional Responses:   Narrative:  Start time: 10/26/2019 7:21 AM End time: 10/26/2019 7:24 AM Injection made incrementally with aspirations every 30 mL.  Performed by: With CRNAs  Anesthesiologist: Louann Sjogren, MD CRNA: Tacy Learn, CRNA

## 2019-10-26 NOTE — Anesthesia Preprocedure Evaluation (Signed)
Anesthesia Evaluation  Patient identified by MRN, date of birth, ID band Patient awake    Reviewed: Allergy & Precautions, H&P , NPO status , Patient's Chart, lab work & pertinent test results, reviewed documented beta blocker date and time   Airway Mallampati: II  TM Distance: >3 FB Neck ROM: full    Dental no notable dental hx.    Pulmonary pneumonia, resolved, COPD, former smoker,    Pulmonary exam normal breath sounds clear to auscultation       Cardiovascular Exercise Tolerance: Good hypertension, negative cardio ROS   Rhythm:regular Rate:Normal     Neuro/Psych negative neurological ROS  negative psych ROS   GI/Hepatic Neg liver ROS, GERD  Medicated,  Endo/Other  negative endocrine ROS  Renal/GU negative Renal ROS  negative genitourinary   Musculoskeletal   Abdominal   Peds  Hematology  (+) Blood dyscrasia, anemia ,   Anesthesia Other Findings   Reproductive/Obstetrics negative OB ROS                             Anesthesia Physical Anesthesia Plan  ASA: III  Anesthesia Plan: General   Post-op Pain Management: GA combined w/ Regional for post-op pain   Induction:   PONV Risk Score and Plan: Ondansetron  Airway Management Planned:   Additional Equipment:   Intra-op Plan:   Post-operative Plan:   Informed Consent: I have reviewed the patients History and Physical, chart, labs and discussed the procedure including the risks, benefits and alternatives for the proposed anesthesia with the patient or authorized representative who has indicated his/her understanding and acceptance.     Dental Advisory Given  Plan Discussed with: CRNA  Anesthesia Plan Comments:         Anesthesia Quick Evaluation

## 2019-10-26 NOTE — Anesthesia Postprocedure Evaluation (Signed)
Anesthesia Post Note  Patient: James Wolf  Procedure(s) Performed: ROTATOR CUFF REPAIR SHOULDER OPEN LEFT (Left Shoulder)  Patient location during evaluation: Phase II Anesthesia Type: General Level of consciousness: awake Pain management: pain level controlled Vital Signs Assessment: post-procedure vital signs reviewed and stable Respiratory status: spontaneous breathing Cardiovascular status: blood pressure returned to baseline Anesthetic complications: no   No complications documented.   Last Vitals:  Vitals:   10/26/19 1100 10/26/19 1111  BP: 111/77 (!) 127/93  Pulse: 77 90  Resp: 19 18  Temp:  36.5 C  SpO2: 93% 96%    Last Pain:  Vitals:   10/26/19 1111  TempSrc: Oral  PainSc: 0-No pain                 Louann Sjogren

## 2019-10-26 NOTE — Transfer of Care (Signed)
Immediate Anesthesia Transfer of Care Note  Patient: James Wolf  Procedure(s) Performed: ROTATOR CUFF REPAIR SHOULDER OPEN LEFT (Left Shoulder)  Patient Location: PACU  Anesthesia Type:General and Regional  Level of Consciousness: drowsy, patient cooperative and responds to stimulation  Airway & Oxygen Therapy: Patient Spontanous Breathing and Patient connected to nasal cannula oxygen  Post-op Assessment: Report given to RN  Post vital signs: Reviewed and stable  Last Vitals:  Vitals Value Taken Time  BP    Temp    Pulse 83 10/26/19 0937  Resp 12 10/26/19 0937  SpO2 99 % 10/26/19 0937  Vitals shown include unvalidated device data.  Last Pain: There were no vitals filed for this visit.       Complications: No complications documented.

## 2019-10-26 NOTE — Brief Op Note (Signed)
10/26/2019  9:28 AM  PATIENT:  James Wolf  69 y.o. male  PRE-OPERATIVE DIAGNOSIS:  left shoulder rotator cuff tear  POST-OPERATIVE DIAGNOSIS:  left shoulder rotator cuff tear  PROCEDURE:  Procedure(s): ROTATOR CUFF REPAIR SHOULDER OPEN LEFT with acromioplasty (Left)   Anchors: arthrex, swivel lock x 2   Findings: Partial articular surface tearing of the rotator cuff primarily the supraspinatus tendon  SURGEON:  Surgeon(s) and Role:    [name], MD - Primary  PHYSICIAN ASSISTANT:   ASSISTANTS: Corrie Dandy  ANESTHESIA:   general and paracervical block  EBL:  50 mL   BLOOD ADMINISTERED:none  DRAINS: none   LOCAL MEDICATIONS USED:  OTHER 20 cc exparel  SPECIMEN:  No Specimen  DISPOSITION OF SPECIMEN:  N/A  COUNTS:  YES  TOURNIQUET:  * No tourniquets in log *  DICTATION: .Dragon Dictation  PLAN OF CARE: Discharge to home after PACU  PATIENT DISPOSITION:  PACU - hemodynamically stable.   Delay start of Pharmacological VTE agent (>24hrs) due to surgical blood loss or risk of bleeding: not applicable  Details of the surgical procedure  The patient was seen in the preop area and the left shoulder was confirmed as the surgical site and marked by the surgeon chart review including MRI results were reviewed and patient was cleared for surgery.  He had a supraclavicular block by anesthesia then was taken to the operating room for general anesthesia  He was placed in the semimodified beachchair position and his arm was examined there was no instability and no evidence of loss of motion  After sterile prep and drape the timeout was completed  I made an incision over the acromion I brought it down between the anterior and middle portions of the deltoid after dividing the subcutaneous tissue split the deltoid up to the acromion and removed it from the acromion 1 cm anteriorly and posteriorly  I did a bursectomy and then examined the rotator cuff.  There  was no superior surface tear of the rotator cuff however based on the MRI and clinical findings the saline injection test was performed and the weak area in the supraspinatus was taken down from the greater tuberosity the cuff was debrided a #2 Ethibond suture was placed for holding and control of the cuff the greater tuberosity was debrided and then a swivel lock anchor was placed at the articular margin and then suture tape was passed through the cuff pulled over the tuberosity into a push lock anchor  The wound was irrigated  Closure: A drill hole was placed in the acromion and a #2 Ethibond suture was used to reattach the deltoid and then the deltoid split was closed with #2 Ethibond.  Subcutaneous tissue was closed with 0 Monocryl in interrupted fashion followed by running 2-0 Monocryl and Steri-Strips  Sterile dressing was applied after the exparel 20 cc full-strength was injected in the subcu tissue and muscular tissue  Patient was extubated and taken to recovery room in stable condition  Postop plan immediate range of motion immediate physical therapy

## 2019-10-26 NOTE — Interval H&P Note (Signed)
History and Physical Interval Note:  10/26/2019 7:19 AM  James Wolf  has presented today for surgery, with the diagnosis of left shoulder rotator cuff tear.  The various methods of treatment have been discussed with the patient and family. After consideration of risks, benefits and other options for treatment, the patient has consented to  Procedure(s): ROTATOR CUFF REPAIR SHOULDER OPEN LEFT (Left) as a surgical intervention.  The patient's history has been reviewed, patient examined, no change in status, stable for surgery.  I have reviewed the patient's chart and labs.  Questions were answered to the patient's satisfaction.     Arther Abbott

## 2019-10-26 NOTE — Op Note (Signed)
10/26/2019  9:33 AM  PATIENT:  James Wolf  69 y.o. male  PRE-OPERATIVE DIAGNOSIS:  left shoulder rotator cuff tear  POST-OPERATIVE DIAGNOSIS:  left shoulder rotator cuff tear  PROCEDURE:  Procedure(s): ROTATOR CUFF REPAIR SHOULDER OPEN LEFT with acromioplasty (Left)   Anchors: arthrex, swivel lock x 2   Findings: Partial articular surface tearing of the rotator cuff primarily the supraspinatus tendon  SURGEON:  Surgeon(s) and Role:    [name], MD - Primary  PHYSICIAN ASSISTANT:   ASSISTANTS: Corrie Dandy  ANESTHESIA:   general and paracervical block  EBL:  50 mL   BLOOD ADMINISTERED:none  DRAINS: none   LOCAL MEDICATIONS USED:  OTHER 20 cc exparel  SPECIMEN:  No Specimen  DISPOSITION OF SPECIMEN:  N/A  COUNTS:  YES  TOURNIQUET:  * No tourniquets in log *  DICTATION: .Dragon Dictation  PLAN OF CARE: Discharge to home after PACU  PATIENT DISPOSITION:  PACU - hemodynamically stable.   Delay start of Pharmacological VTE agent (>24hrs) due to surgical blood loss or risk of bleeding: not applicable  Details of the surgical procedure  The patient was seen in the preop area and the left shoulder was confirmed as the surgical site and marked by the surgeon chart review including MRI results were reviewed and patient was cleared for surgery.  He had a supraclavicular block by anesthesia then was taken to the operating room for general anesthesia  He was placed in the semimodified beachchair position and his arm was examined there was no instability and no evidence of loss of motion  After sterile prep and drape the timeout was completed  I made an incision over the acromion I brought it down between the anterior and middle portions of the deltoid after dividing the subcutaneous tissue split the deltoid up to the acromion and removed it from the acromion 1 cm anteriorly and posteriorly  I did a bursectomy and then examined the rotator cuff.  There  was no superior surface tear of the rotator cuff however based on the MRI and clinical findings the saline injection test was performed and the weak area in the supraspinatus was taken down from the greater tuberosity the cuff was debrided a #2 Ethibond suture was placed for holding and control of the cuff the greater tuberosity was debrided and then a swivel lock anchor was placed at the articular margin and then suture tape was passed through the cuff pulled over the tuberosity into a push lock anchor  The wound was irrigated  Closure: A drill hole was placed in the acromion and a #2 Ethibond suture was used to reattach the deltoid and then the deltoid split was closed with #2 Ethibond.  Subcutaneous tissue was closed with 0 Monocryl in interrupted fashion followed by running 2-0 Monocryl and Steri-Strips  Sterile dressing was applied after the exparel 20 cc full-strength was injected in the subcu tissue and muscular tissue  Patient was extubated and taken to recovery room in stable condition  Postop plan immediate range of motion immediate physical therapy

## 2019-10-26 NOTE — Brief Op Note (Signed)
10/26/2019  9:33 AM  PATIENT:  Lavona Mound  69 y.o. male  PRE-OPERATIVE DIAGNOSIS:  left shoulder rotator cuff tear  POST-OPERATIVE DIAGNOSIS:  left shoulder rotator cuff tear  PROCEDURE:  Procedure(s): ROTATOR CUFF REPAIR SHOULDER OPEN LEFT with acromioplasty (Left)   Anchors: arthrex, swivel lock x 2   Findings: Partial articular surface tearing of the rotator cuff primarily the supraspinatus tendon  SURGEON:  Surgeon(s) and Role:    [name], MD - Primary  PHYSICIAN ASSISTANT:   ASSISTANTS: Corrie Dandy  ANESTHESIA:   general and paracervical block  EBL:  50 mL   BLOOD ADMINISTERED:none  DRAINS: none   LOCAL MEDICATIONS USED:  OTHER 20 cc exparel  SPECIMEN:  No Specimen  DISPOSITION OF SPECIMEN:  N/A  COUNTS:  YES  TOURNIQUET:  * No tourniquets in log *  DICTATION: .Dragon Dictation  PLAN OF CARE: Discharge to home after PACU  PATIENT DISPOSITION:  PACU - hemodynamically stable.   Delay start of Pharmacological VTE agent (>24hrs) due to surgical blood loss or risk of bleeding: not applicable  Details of the surgical procedure  The patient was seen in the preop area and the left shoulder was confirmed as the surgical site and marked by the surgeon chart review including MRI results were reviewed and patient was cleared for surgery.  He had a supraclavicular block by anesthesia then was taken to the operating room for general anesthesia  He was placed in the semimodified beachchair position and his arm was examined there was no instability and no evidence of loss of motion  After sterile prep and drape the timeout was completed  I made an incision over the acromion I brought it down between the anterior and middle portions of the deltoid after dividing the subcutaneous tissue split the deltoid up to the acromion and removed it from the acromion 1 cm anteriorly and posteriorly  I did a bursectomy and then examined the rotator cuff.  There  was no superior surface tear of the rotator cuff however based on the MRI and clinical findings the saline injection test was performed and the weak area in the supraspinatus was taken down from the greater tuberosity the cuff was debrided a #2 Ethibond suture was placed for holding and control of the cuff the greater tuberosity was debrided and then a swivel lock anchor was placed at the articular margin and then suture tape was passed through the cuff pulled over the tuberosity into a push lock anchor  The wound was irrigated  Closure: A drill hole was placed in the acromion and a #2 Ethibond suture was used to reattach the deltoid and then the deltoid split was closed with #2 Ethibond.  Subcutaneous tissue was closed with 0 Monocryl in interrupted fashion followed by running 2-0 Monocryl and Steri-Strips  Sterile dressing was applied after the exparel 20 cc full-strength was injected in the subcu tissue and muscular tissue  Patient was extubated and taken to recovery room in stable condition  Postop plan immediate range of motion immediate physical therapy

## 2019-10-26 NOTE — Anesthesia Procedure Notes (Signed)
Procedure Name: Intubation Date/Time: 10/26/2019 7:50 AM Performed by: Gayland Curry, CRNA Pre-anesthesia Checklist: Patient identified, Emergency Drugs available, Suction available and Patient being monitored Patient Re-evaluated:Patient Re-evaluated prior to induction Oxygen Delivery Method: Circle system utilized Preoxygenation: Pre-oxygenation with 100% oxygen Induction Type: IV induction Ventilation: Mask ventilation without difficulty and Oral airway inserted - appropriate to patient size Laryngoscope Size: Mac and 3 Grade View: Grade II Tube type: Oral Tube size: 7.0 mm Number of attempts: 1 Placement Confirmation: ETT inserted through vocal cords under direct vision,  positive ETCO2 and breath sounds checked- equal and bilateral Secured at: 21 cm Tube secured with: Tape Dental Injury: Teeth and Oropharynx as per pre-operative assessment

## 2019-10-27 ENCOUNTER — Encounter (HOSPITAL_COMMUNITY): Payer: Self-pay | Admitting: Orthopedic Surgery

## 2019-10-27 MED ORDER — ROPIVACAINE HCL 5 MG/ML IJ SOLN
INTRAMUSCULAR | Status: DC | PRN
  Administered 2019-10-26: 30 mL via EPIDURAL

## 2019-10-27 NOTE — Addendum Note (Signed)
Addendum  created 10/27/19 0800 by Tacy Learn, CRNA   Charge Capture section accepted, Clinical Note Signed, Intraprocedure Blocks edited

## 2019-11-09 DIAGNOSIS — Z9889 Other specified postprocedural states: Secondary | ICD-10-CM | POA: Insufficient documentation

## 2019-11-10 ENCOUNTER — Other Ambulatory Visit: Payer: Self-pay

## 2019-11-10 ENCOUNTER — Ambulatory Visit (INDEPENDENT_AMBULATORY_CARE_PROVIDER_SITE_OTHER): Payer: BC Managed Care – PPO | Admitting: Orthopedic Surgery

## 2019-11-10 DIAGNOSIS — Z9889 Other specified postprocedural states: Secondary | ICD-10-CM

## 2019-11-10 NOTE — Progress Notes (Signed)
Chief Complaint  Patient presents with  . Routine Post Op    10/26/19 post op     15 days post op  Cuff repair left shoulder   10/26/2019  9:33 AM  PATIENT:  James Wolf  69 y.o. male  PRE-OPERATIVE DIAGNOSIS:  left shoulder rotator cuff tear  POST-OPERATIVE DIAGNOSIS:  left shoulder rotator cuff tear  PROCEDURE:  Procedure(s): ROTATOR CUFF REPAIR SHOULDER OPEN LEFT with acromioplasty (Left)   Anchors: arthrex, swivel lock x 2   Findings: Partial articular surface tearing of the rotator cuff primarily the supraspinatus tendon  He is doing well the suture ends were clipped his pain is well controlled he can start his therapy at [facility] ER he can remove his sling for eating  We gave him his instructions on sling wear follow-up in 3 weeks

## 2019-11-10 NOTE — Addendum Note (Signed)
Addended byCandice Camp on: 11/10/2019 11:38 AM   Modules accepted: Orders

## 2019-12-06 ENCOUNTER — Other Ambulatory Visit: Payer: Self-pay

## 2019-12-06 ENCOUNTER — Ambulatory Visit (INDEPENDENT_AMBULATORY_CARE_PROVIDER_SITE_OTHER): Payer: BC Managed Care – PPO | Admitting: Orthopedic Surgery

## 2019-12-06 ENCOUNTER — Encounter: Payer: Self-pay | Admitting: Orthopedic Surgery

## 2019-12-06 DIAGNOSIS — Z9889 Other specified postprocedural states: Secondary | ICD-10-CM

## 2019-12-06 NOTE — Progress Notes (Signed)
Chief Complaint  Patient presents with  . Shoulder Pain    s/p 10/26/2019 for left shoulder,    6-week postop left shoulder patient doing well minimal discomfort ready to start therapy he actually can abduct his arm 60 degrees and flex it about 90 degrees  I told him to cool don't lift anything heavy he cannot take his sling off he can drive follow-up in 6 weeks  Start therapy  Encounter Diagnosis  Name Primary?  . S/P left rotator cuff repair 10/26/19 Yes

## 2019-12-10 ENCOUNTER — Ambulatory Visit: Payer: BC Managed Care – PPO | Admitting: Orthopedic Surgery

## 2019-12-16 ENCOUNTER — Telehealth: Payer: Self-pay | Admitting: Orthopedic Surgery

## 2019-12-16 NOTE — Telephone Encounter (Signed)
Has note for Jury duty Wants note to excuse so he does not lose progress with therapy

## 2019-12-16 NOTE — Telephone Encounter (Signed)
Patient left voice message requesting a call back "regarding a technical question."  Please call [phone]

## 2019-12-17 ENCOUNTER — Encounter: Payer: Self-pay | Admitting: Orthopedic Surgery

## 2019-12-17 NOTE — Telephone Encounter (Signed)
Called patient; aware note ready as requested. (Mailed)

## 2019-12-17 NOTE — Telephone Encounter (Signed)
ok 

## 2020-01-17 ENCOUNTER — Other Ambulatory Visit: Payer: Self-pay

## 2020-01-17 ENCOUNTER — Ambulatory Visit (INDEPENDENT_AMBULATORY_CARE_PROVIDER_SITE_OTHER): Payer: BC Managed Care – PPO | Admitting: Orthopedic Surgery

## 2020-01-17 ENCOUNTER — Encounter: Payer: Self-pay | Admitting: Orthopedic Surgery

## 2020-01-17 VITALS — BP 164/95 | HR 85 | Ht 65.5 in | Wt 171.0 lb

## 2020-01-17 DIAGNOSIS — G8929 Other chronic pain: Secondary | ICD-10-CM

## 2020-01-17 DIAGNOSIS — M25511 Pain in right shoulder: Secondary | ICD-10-CM

## 2020-01-17 DIAGNOSIS — Z9889 Other specified postprocedural states: Secondary | ICD-10-CM

## 2020-01-17 NOTE — Progress Notes (Signed)
Chief Complaint  Patient presents with  . Shoulder Pain    S/P left rotator cuff 10/26/19. Patient reports having some pain,    Patient is having some anterior shoulder pain on the left but he is regained his forward elevation he has 40 degrees of external rotation he just needs some more therapy strengthening and I will see him again in about 2 months  New complaint today pain right shoulder worse since he had his left rotator cuff repair painful forward elevation  Mild weakness  Recommend subacromial injection   Procedure note the subacromial injection shoulder RIGHT    Verbal consent was obtained to inject the  RIGHT   Shoulder  Timeout was completed to confirm the injection site is a subacromial space of the  RIGHT  shoulder   Medication used Depo-Medrol 40 mg and lidocaine 1% 3 cc  Anesthesia was provided by ethyl chloride  The injection was performed in the RIGHT  posterior subacromial space. After pinning the skin with alcohol and anesthetized the skin with ethyl chloride the subacromial space was injected using a 20-gauge needle. There were no complications  Sterile dressing was applied.

## 2020-03-16 ENCOUNTER — Other Ambulatory Visit: Payer: Self-pay

## 2020-03-16 ENCOUNTER — Ambulatory Visit: Payer: BC Managed Care – PPO | Admitting: Orthopedic Surgery

## 2020-03-16 VITALS — BP 134/96 | HR 80 | Ht 65.5 in | Wt 174.0 lb

## 2020-03-16 DIAGNOSIS — M75112 Incomplete rotator cuff tear or rupture of left shoulder, not specified as traumatic: Secondary | ICD-10-CM

## 2020-03-16 DIAGNOSIS — Z9889 Other specified postprocedural states: Secondary | ICD-10-CM

## 2020-03-16 NOTE — Progress Notes (Signed)
Chief Complaint  Patient presents with  . Routine Post Op    Lt shoulder DOS 10/26/19   Encounter Diagnosis  Name Primary?  . S/P left rotator cuff repair Yes    [name] is 5 months after left rotator cuff repair he is progressing very well he has excellent passive range of motion good active range of motion except for internal rotation  Unfortunately the subacromial injections right shoulder did not cure his symptoms and he says he has had pain there for 30 years and we will have to address this at a later date sometime in the summer  He should continue his strengthening exercises I will see him in 3 months but overall progressing very well

## 2020-05-29 ENCOUNTER — Other Ambulatory Visit: Payer: Self-pay

## 2020-05-29 ENCOUNTER — Inpatient Hospital Stay (HOSPITAL_COMMUNITY)
Admission: EM | Admit: 2020-05-29 | Discharge: 2020-06-08 | DRG: 233 | Disposition: A | Discharge status: Home / Self Care | Payer: BC Managed Care – PPO | Attending: Thoracic Surgery (Cardiothoracic Vascular Surgery) | Admitting: Thoracic Surgery (Cardiothoracic Vascular Surgery)

## 2020-05-29 ENCOUNTER — Encounter (HOSPITAL_COMMUNITY): Payer: Self-pay | Admitting: *Deleted

## 2020-05-29 ENCOUNTER — Emergency Department (HOSPITAL_COMMUNITY): Payer: BC Managed Care – PPO

## 2020-05-29 DIAGNOSIS — Z8249 Family history of ischemic heart disease and other diseases of the circulatory system: Secondary | ICD-10-CM

## 2020-05-29 DIAGNOSIS — D62 Acute posthemorrhagic anemia: Secondary | ICD-10-CM | POA: Diagnosis not present

## 2020-05-29 DIAGNOSIS — J449 Chronic obstructive pulmonary disease, unspecified: Secondary | ICD-10-CM | POA: Diagnosis present

## 2020-05-29 DIAGNOSIS — R079 Chest pain, unspecified: Secondary | ICD-10-CM

## 2020-05-29 DIAGNOSIS — D696 Thrombocytopenia, unspecified: Secondary | ICD-10-CM | POA: Diagnosis present

## 2020-05-29 DIAGNOSIS — Z951 Presence of aortocoronary bypass graft: Secondary | ICD-10-CM

## 2020-05-29 DIAGNOSIS — E785 Hyperlipidemia, unspecified: Secondary | ICD-10-CM | POA: Diagnosis present

## 2020-05-29 DIAGNOSIS — R7303 Prediabetes: Secondary | ICD-10-CM | POA: Diagnosis present

## 2020-05-29 DIAGNOSIS — I209 Angina pectoris, unspecified: Secondary | ICD-10-CM

## 2020-05-29 DIAGNOSIS — J9811 Atelectasis: Secondary | ICD-10-CM | POA: Diagnosis present

## 2020-05-29 DIAGNOSIS — I4510 Unspecified right bundle-branch block: Secondary | ICD-10-CM | POA: Diagnosis present

## 2020-05-29 DIAGNOSIS — K219 Gastro-esophageal reflux disease without esophagitis: Secondary | ICD-10-CM | POA: Diagnosis present

## 2020-05-29 DIAGNOSIS — I25110 Atherosclerotic heart disease of native coronary artery with unstable angina pectoris: Secondary | ICD-10-CM | POA: Diagnosis present

## 2020-05-29 DIAGNOSIS — E8770 Fluid overload, unspecified: Secondary | ICD-10-CM | POA: Diagnosis present

## 2020-05-29 DIAGNOSIS — R1310 Dysphagia, unspecified: Secondary | ICD-10-CM | POA: Diagnosis present

## 2020-05-29 DIAGNOSIS — I471 Supraventricular tachycardia: Secondary | ICD-10-CM | POA: Diagnosis not present

## 2020-05-29 DIAGNOSIS — M4854XA Collapsed vertebra, not elsewhere classified, thoracic region, initial encounter for fracture: Secondary | ICD-10-CM | POA: Diagnosis present

## 2020-05-29 DIAGNOSIS — I214 Non-ST elevation (NSTEMI) myocardial infarction: Principal | ICD-10-CM | POA: Diagnosis present

## 2020-05-29 DIAGNOSIS — M810 Age-related osteoporosis without current pathological fracture: Secondary | ICD-10-CM | POA: Diagnosis present

## 2020-05-29 DIAGNOSIS — E876 Hypokalemia: Secondary | ICD-10-CM

## 2020-05-29 DIAGNOSIS — D72829 Elevated white blood cell count, unspecified: Secondary | ICD-10-CM

## 2020-05-29 DIAGNOSIS — I2510 Atherosclerotic heart disease of native coronary artery without angina pectoris: Secondary | ICD-10-CM | POA: Diagnosis present

## 2020-05-29 DIAGNOSIS — Z87891 Personal history of nicotine dependence: Secondary | ICD-10-CM

## 2020-05-29 DIAGNOSIS — M4856XA Collapsed vertebra, not elsewhere classified, lumbar region, initial encounter for fracture: Secondary | ICD-10-CM | POA: Diagnosis present

## 2020-05-29 DIAGNOSIS — Z87442 Personal history of urinary calculi: Secondary | ICD-10-CM

## 2020-05-29 DIAGNOSIS — Z79899 Other long term (current) drug therapy: Secondary | ICD-10-CM

## 2020-05-29 DIAGNOSIS — Z8501 Personal history of malignant neoplasm of esophagus: Secondary | ICD-10-CM

## 2020-05-29 DIAGNOSIS — I10 Essential (primary) hypertension: Secondary | ICD-10-CM | POA: Diagnosis present

## 2020-05-29 DIAGNOSIS — J690 Pneumonitis due to inhalation of food and vomit: Secondary | ICD-10-CM | POA: Diagnosis present

## 2020-05-29 DIAGNOSIS — Z881 Allergy status to other antibiotic agents status: Secondary | ICD-10-CM

## 2020-05-29 DIAGNOSIS — Z20822 Contact with and (suspected) exposure to covid-19: Secondary | ICD-10-CM | POA: Diagnosis present

## 2020-05-29 DIAGNOSIS — J90 Pleural effusion, not elsewhere classified: Secondary | ICD-10-CM | POA: Diagnosis not present

## 2020-05-29 DIAGNOSIS — E782 Mixed hyperlipidemia: Secondary | ICD-10-CM | POA: Diagnosis present

## 2020-05-29 LAB — BASIC METABOLIC PANEL
Anion gap: 7 (ref 5–15)
BUN: 11 mg/dL (ref 8–23)
CO2: 27 mmol/L (ref 22–32)
Calcium: 8.7 mg/dL — ABNORMAL LOW (ref 8.9–10.3)
Chloride: 104 mmol/L (ref 98–111)
Creatinine, Ser: 1.11 mg/dL (ref 0.61–1.24)
GFR, Estimated: >60 mL/min (ref >60)
Glucose, Bld: 128 mg/dL — ABNORMAL HIGH (ref 70–99)
Potassium: 3.3 mmol/L — ABNORMAL LOW (ref 3.5–5.1)
Sodium: 138 mmol/L (ref 135–145)

## 2020-05-29 LAB — CBC
HCT: 40.7 % (ref 39.0–52.0)
Hemoglobin: 13 g/dL (ref 13.0–17.0)
MCH: 29.5 pg (ref 26.0–34.0)
MCHC: 31.9 g/dL (ref 30.0–36.0)
MCV: 92.5 fL (ref 80.0–100.0)
Platelets: 262 10*3/uL (ref 150–400)
RBC: 4.4 MIL/uL (ref 4.22–5.81)
RDW: 14 % (ref 11.5–15.5)
WBC: 13 10*3/uL — ABNORMAL HIGH (ref 4.0–10.5)
nRBC: 0 % (ref 0.0–0.2)

## 2020-05-29 LAB — TROPONIN I (HIGH SENSITIVITY): Troponin I (High Sensitivity): 16 ng/L (ref <18)

## 2020-05-29 IMAGING — DX DG CHEST 2V
2 series · 2 of 2 positions shown · non-contrast
Comparison: Most recent comparison [DATE], CT [DATE]

CLINICAL DATA: Chest pain. Patient reports he went to [REDACTED], left AMA.

EXAM:
CHEST - 2 VIEW

[chest pa]
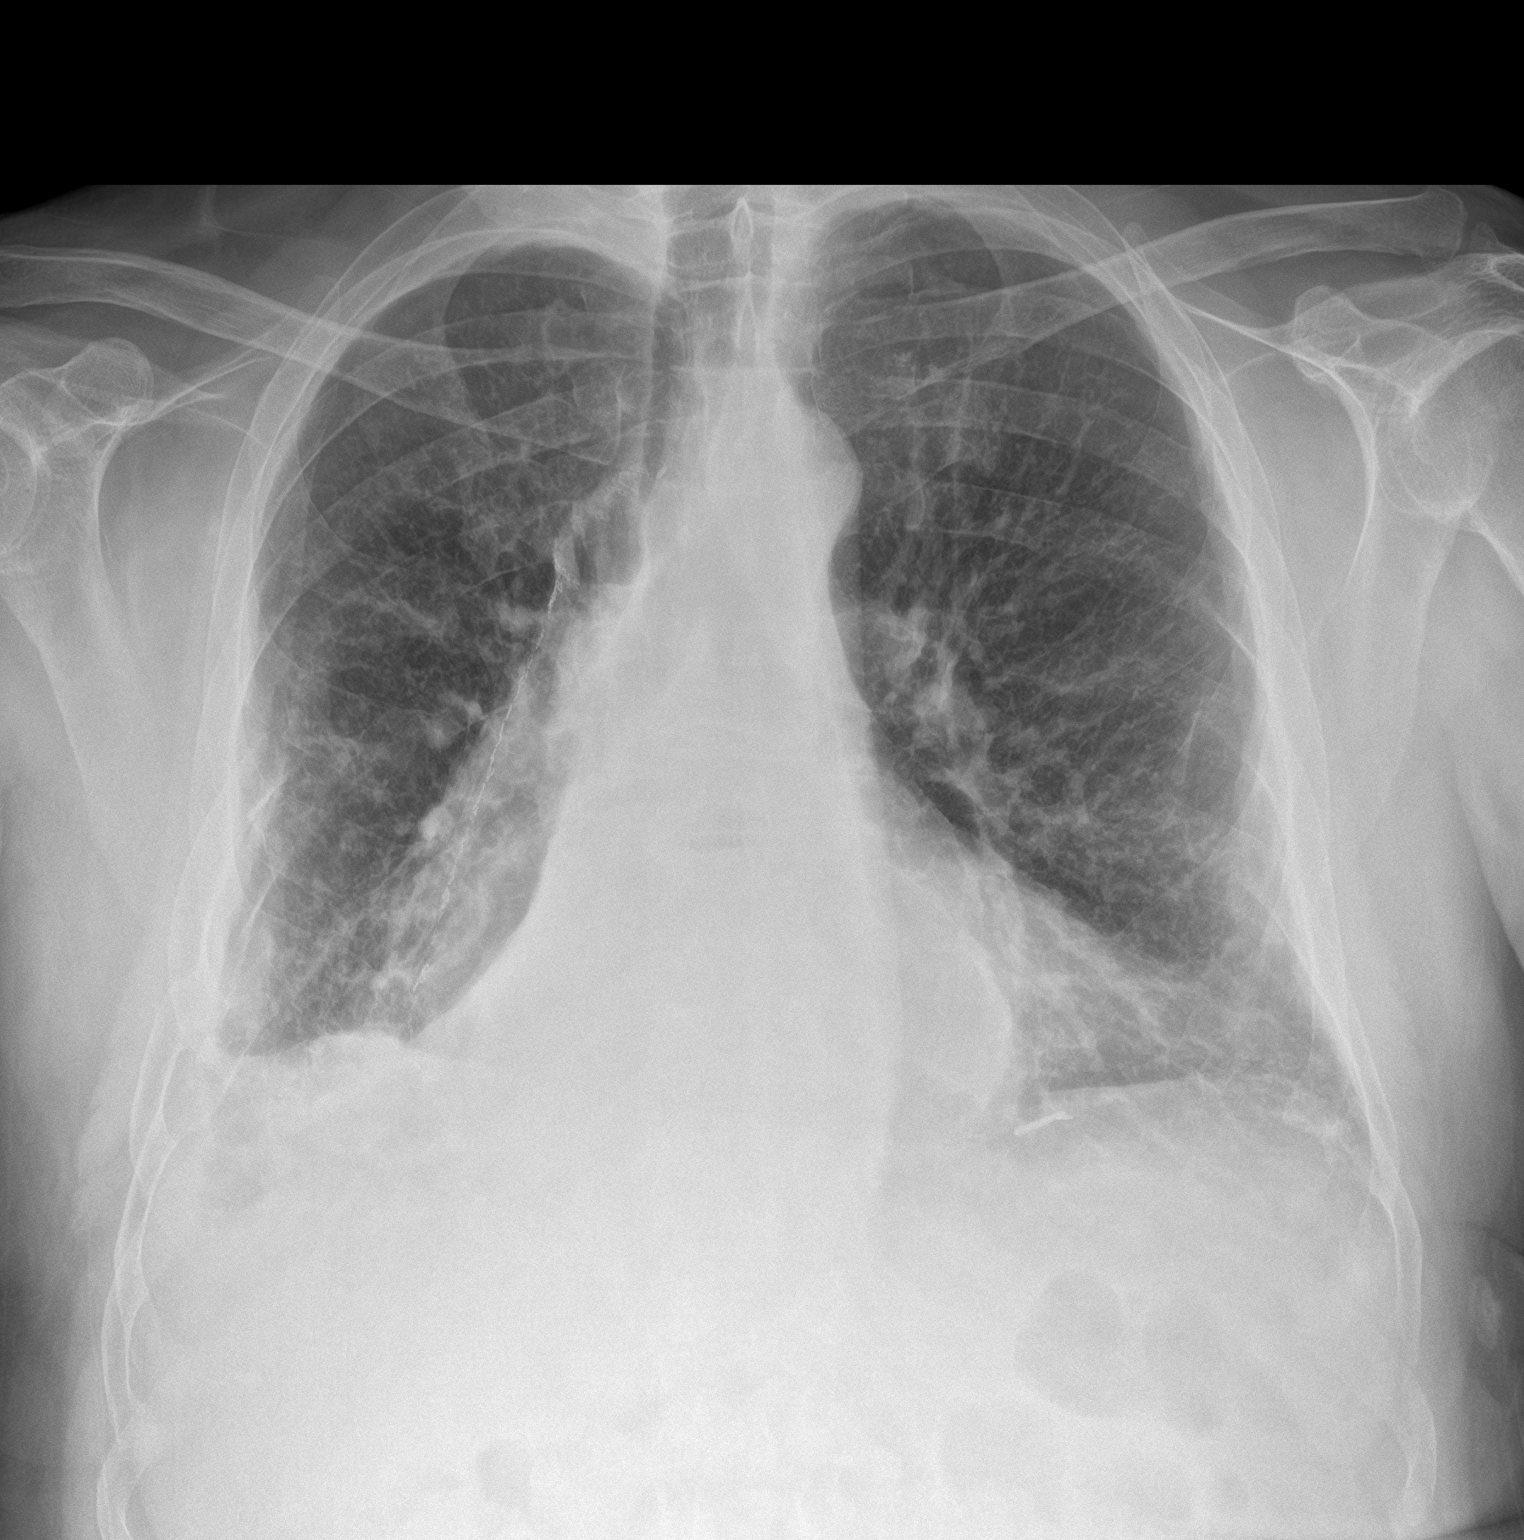

[chest lat]
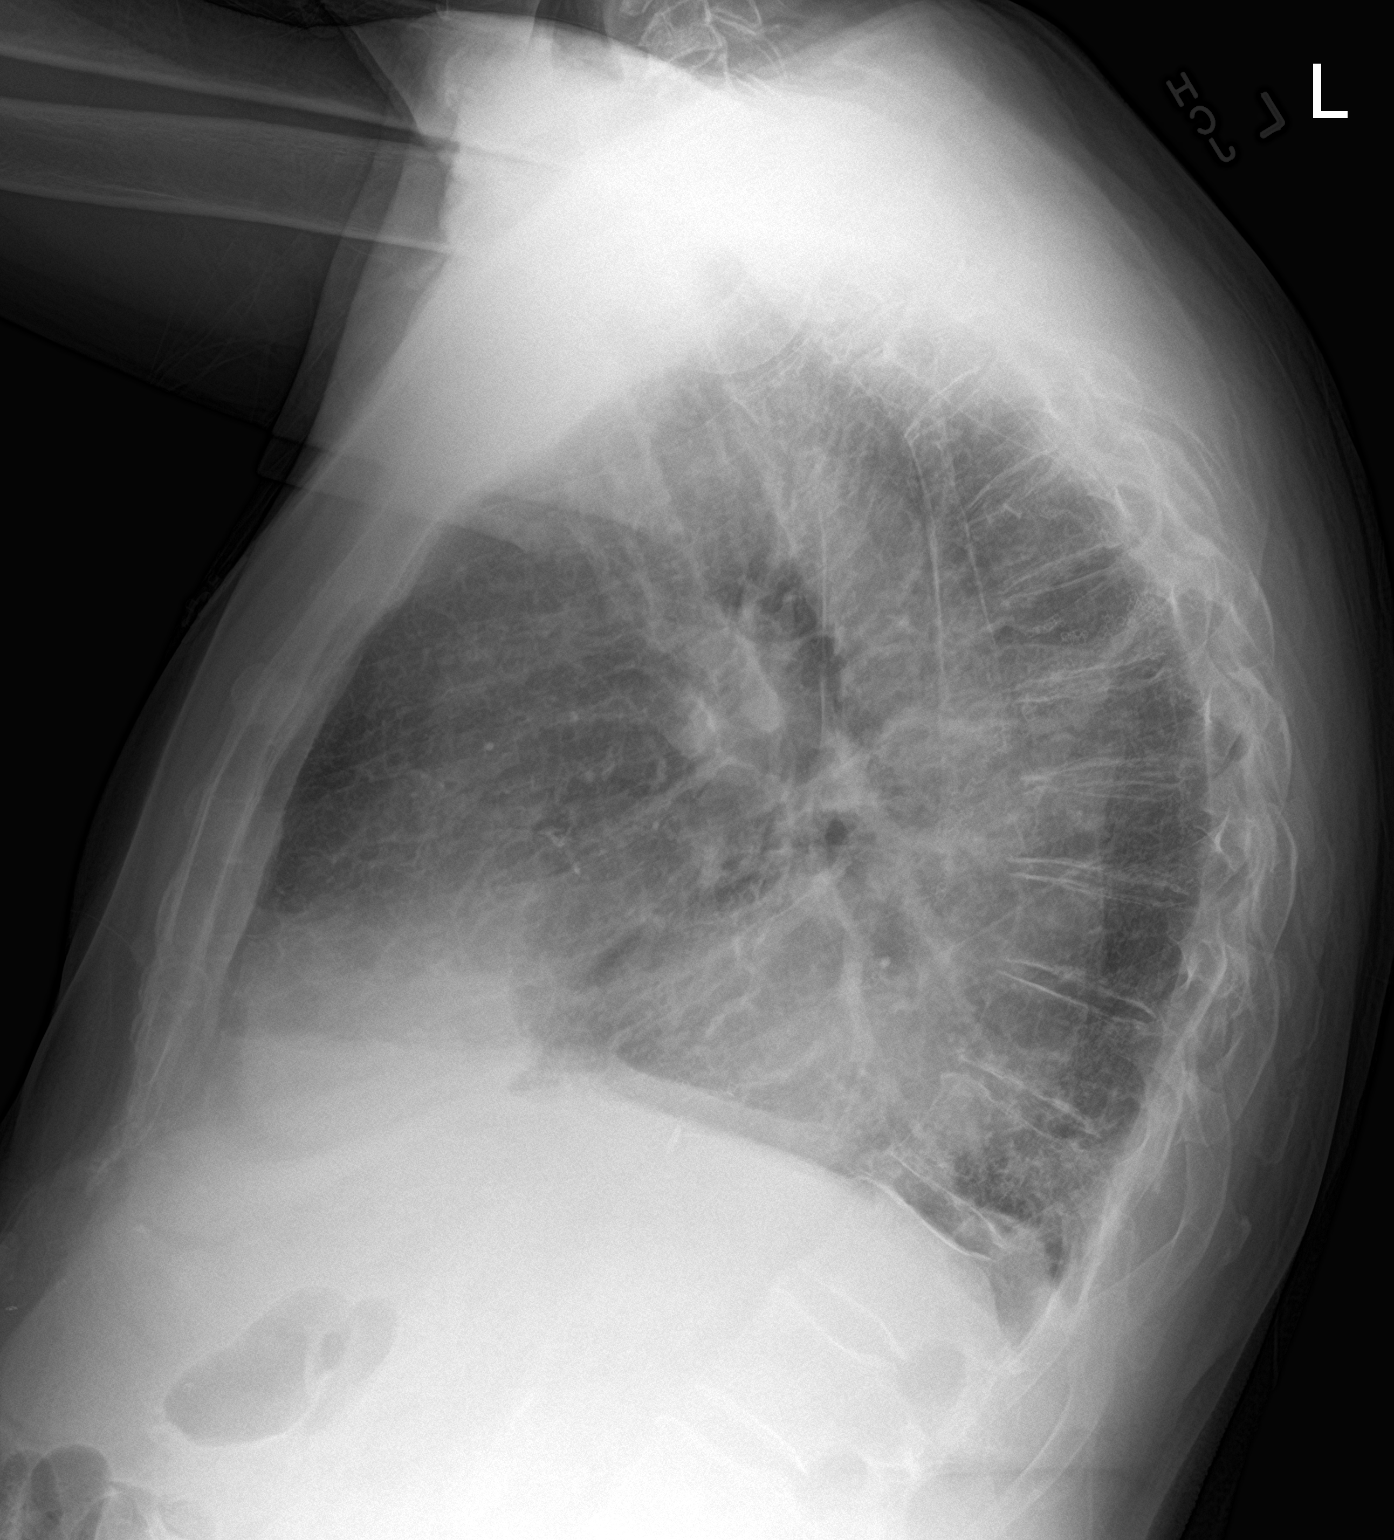

[2 of 2 positions shown; findings below may reference images not displayed]

FINDINGS: Stable heart size and mediastinal contours. Gastric pull-through.
Chronic bronchial thickening and hyperinflation. Patchy lung base
opacity in the peripheries, left greater than right. No large
pleural effusion. No pneumothorax. Chronic right apical
pleuroparenchymal scarring. No acute osseous abnormalities are seen.
Remote right rib fractures.
IMPRESSION: 1. Patchy lung base opacity in the peripheries, left greater than
right, may be atelectasis or pneumonia.
2. Chronic bronchial thickening and hyperinflation.
3. Gastric pull-through.

## 2020-05-29 MED ORDER — ASPIRIN 81 MG PO CHEW
324.0000 mg | CHEWABLE_TABLET | Freq: Once | ORAL | Status: AC
  Administered 2020-05-29: 324 mg via ORAL
  Filled 2020-05-29: qty 4

## 2020-05-29 NOTE — ED Provider Notes (Signed)
[facility] Provider Note  CSN: [id number] Arrival date & time: 05/29/20 1630    History Chief Complaint  Patient presents with  . Chest Pain    HPI  James Wolf is a 70 y.o. male with remote history of esophageal cancer, aspiration PNA and rotator cuff surgery reports he had some L upper chest/shoulder pain 2 days ago he thought was from working out but returned yesterday and became more severe. He went to the ED in [place] where he reports his troponins were trending up and they had planned to admit him for further evaluation. He does not like their cardiologists so he left AMA and then came to [facility] today for re-evaluation. He has not had any further chest pain today. No DOE or leg swelling. No nausea or diaphoresis.    Past Medical History:  Diagnosis Date  . Anemia   . Aspiration pneumonia ([place])   . Cancer ([place])    esophageal cancer 1995  . Chest pain   . COPD (chronic obstructive pulmonary disease) ([place])   . GERD (gastroesophageal reflux disease)   . History of kidney stones   . Hyperlipidemia   . Hypertension   . Joint pain   . Osteomyelitis ([place])   . Osteoporosis   . Rheumatic fever     Past Surgical History:  Procedure Laterality Date  . CYSTOSCOPY/RETROGRADE/URETEROSCOPY/STONE EXTRACTION WITH BASKET    . partial esophageal ejectory    . SHOULDER OPEN ROTATOR CUFF REPAIR Left 10/26/2019   Procedure: ROTATOR CUFF REPAIR SHOULDER OPEN LEFT;  Surgeon: [name], MD;  Location: [facility];  Service: Orthopedics;  Laterality: Left;    No family history on file.  Social History   Tobacco Use  . Smoking status: Former Smoker    Packs/day: 1.00    Years: 25.00    Pack years: 25.00    Types: Cigarettes    Quit date: 02/19/1993    Years since quitting: 27.2  . Smokeless tobacco: Never Used  Vaping Use  . Vaping Use: Never used  Substance Use Topics  . Alcohol use: Not Currently    Comment: occ  . Drug use: Never      Home Medications Prior to Admission medications   Medication Sig Start Date End Date Taking? Authorizing Provider  albuterol (VENTOLIN HFA) 108 (90 Base) MCG/ACT inhaler Inhale 1-2 puffs into the lungs every 6 (six) hours as needed for wheezing or shortness of breath.    [provider]  B Complex Vitamins (VITAMIN B COMPLEX PO) Take 1 tablet by mouth daily.  03/10/07   [provider]  cholecalciferol (VITAMIN D3) 25 MCG (1000 UNIT) tablet Take 1,000 Units by mouth daily.    [provider]  diclofenac sodium (VOLTAREN) 1 % GEL Apply 2 g topically 2 (two) times daily as needed (pain).  09/24/15   [provider]  esomeprazole (NEXIUM) 40 MG capsule Take 40 mg by mouth 2 (two) times daily.  05/26/17   [provider]  ibuprofen (ADVIL) 800 MG tablet Take 1 tablet (800 mg total) by mouth every 8 (eight) hours as needed. 10/26/19   [name], MD  telmisartan (MICARDIS) 20 MG tablet Take 20 mg by mouth daily.  06/30/17   [provider]     Allergies    Zithromax [azithromycin]   Review of Systems   Review of Systems A comprehensive review of systems was completed and negative except as noted in HPI.  Physical Exam BP (!) 138/91 (BP Location: Right Arm)   Pulse 83   Temp 98.4 F (36.9 C) (Oral)   Resp 16   SpO2 98%   Physical Exam Vitals and nursing note reviewed.  Constitutional:      Appearance: Normal appearance.  HENT:     Head: Normocephalic and atraumatic.     Nose: Nose normal.     Mouth/Throat:     Mouth: Mucous membranes are moist.  Eyes:     Extraocular Movements: Extraocular movements intact.     Conjunctiva/sclera: Conjunctivae normal.  Cardiovascular:     Rate and Rhythm: Normal rate.  Pulmonary:     Effort: Pulmonary effort is normal.     Breath sounds: Normal breath sounds.  Abdominal:     General: Abdomen is flat.     Palpations: Abdomen is soft.     Tenderness: There is no abdominal  tenderness.  Musculoskeletal:        General: No swelling. Normal range of motion.     Cervical back: Neck supple.  Skin:    General: Skin is warm and dry.  Neurological:     General: No focal deficit present.     Mental Status: He is alert.  Psychiatric:        Mood and Affect: Mood normal.      ED Results / Procedures / Treatments   Labs (all labs ordered are listed, but only abnormal results are displayed) Labs Reviewed  BASIC METABOLIC PANEL  CBC  TROPONIN I (HIGH SENSITIVITY)    EKG EKG Interpretation  Date/Time:  Monday May 29 2020 17:33:11 EDT Ventricular Rate:  82 PR Interval:  166 QRS Duration: 92 QT Interval:  352 QTC Calculation: 411 R Axis:   25 Text Interpretation: Normal sinus rhythm Incomplete right bundle branch block Borderline ECG No significant change since last tracing Confirmed by Calvert Cantor [phone]) on 05/29/2020 5:38:48 PM   Radiology No results found.  Procedures Procedures  Medications Ordered in the ED Medications - No data to display   MDM Rules/Calculators/A&P MDM EKG without acute ischemic changes. Patient is currently asymptomatic. Will check labs and CXR, attempt to get records from [facility].  ED Course  I have reviewed the triage vital signs and the nursing notes.  Pertinent labs & imaging results that were available during my care of the patient were reviewed by me and considered in my medical decision making (see chart for details).  Clinical Course as of 05/29/20 2345  Mon May 29, 2020  1941 Records from [place] reviewed, Trops were 15->76->101.  [CS]  1956 CBC with mild leukocytosis, otherwise normal.  [CS]  2045 First troponin today is normal.  [CS]  2045 BMP is unremarkable.  [CS]  2053 Given patient's chest pain and elevated trop, concern for ACS/unstable angina. Will discuss admission with the hospitalist for further evaluation.  [CS]  2312 Spoke with Dr. Josephine Cables, Hospitalist, who will evaluate for  admission. [CS]    Clinical Course User Index [CS] Truddie Hidden, MD    Final Clinical Impression(s) / ED Diagnoses Final diagnoses:  Chest pain, unspecified type    Rx / DC Orders ED Discharge Orders    None       Truddie Hidden, MD 05/29/20 2345

## 2020-05-29 NOTE — ED Triage Notes (Signed)
States he had chest pain onset last night and went to [facility] and was worked up and advised he had elevated troponin, signed out AMA and came here. Did not advise registration initially of chest pain

## 2020-05-29 NOTE — ED Notes (Signed)
Pt requesting to take his Nexium from home. Offered alternatives that are used @ [facility] declined stating "he knows that Nexium works for him and has already tried other things". Dr Karle Starch notified and orders given to allow pt to take Nexium from home. Pt's wife informed of this and to car to retrieve medication. In addition, pt requesting something to eat and this was approved as well by Dr Karle Starch until [place]. Pt given meal tray and drink.

## 2020-05-30 ENCOUNTER — Other Ambulatory Visit (HOSPITAL_COMMUNITY): Payer: Self-pay | Admitting: *Deleted

## 2020-05-30 ENCOUNTER — Encounter (HOSPITAL_COMMUNITY): Payer: Self-pay | Admitting: Internal Medicine

## 2020-05-30 ENCOUNTER — Inpatient Hospital Stay (HOSPITAL_COMMUNITY)
Admission: EM | Disposition: A | Discharge status: Home / Self Care | Payer: Self-pay | Source: Home / Self Care | Attending: Thoracic Surgery (Cardiothoracic Vascular Surgery)

## 2020-05-30 ENCOUNTER — Observation Stay (HOSPITAL_COMMUNITY): Payer: BC Managed Care – PPO

## 2020-05-30 DIAGNOSIS — R079 Chest pain, unspecified: Secondary | ICD-10-CM | POA: Diagnosis present

## 2020-05-30 DIAGNOSIS — R7303 Prediabetes: Secondary | ICD-10-CM | POA: Diagnosis present

## 2020-05-30 DIAGNOSIS — I2510 Atherosclerotic heart disease of native coronary artery without angina pectoris: Secondary | ICD-10-CM | POA: Diagnosis not present

## 2020-05-30 DIAGNOSIS — I25110 Atherosclerotic heart disease of native coronary artery with unstable angina pectoris: Secondary | ICD-10-CM | POA: Diagnosis present

## 2020-05-30 DIAGNOSIS — D696 Thrombocytopenia, unspecified: Secondary | ICD-10-CM | POA: Diagnosis present

## 2020-05-30 DIAGNOSIS — E876 Hypokalemia: Secondary | ICD-10-CM | POA: Diagnosis present

## 2020-05-30 DIAGNOSIS — D72829 Elevated white blood cell count, unspecified: Secondary | ICD-10-CM

## 2020-05-30 DIAGNOSIS — K219 Gastro-esophageal reflux disease without esophagitis: Secondary | ICD-10-CM | POA: Insufficient documentation

## 2020-05-30 DIAGNOSIS — I10 Essential (primary) hypertension: Secondary | ICD-10-CM | POA: Diagnosis present

## 2020-05-30 DIAGNOSIS — M4856XA Collapsed vertebra, not elsewhere classified, lumbar region, initial encounter for fracture: Secondary | ICD-10-CM | POA: Diagnosis present

## 2020-05-30 DIAGNOSIS — E782 Mixed hyperlipidemia: Secondary | ICD-10-CM

## 2020-05-30 DIAGNOSIS — Z87891 Personal history of nicotine dependence: Secondary | ICD-10-CM

## 2020-05-30 DIAGNOSIS — I214 Non-ST elevation (NSTEMI) myocardial infarction: Secondary | ICD-10-CM | POA: Diagnosis present

## 2020-05-30 DIAGNOSIS — E785 Hyperlipidemia, unspecified: Secondary | ICD-10-CM | POA: Diagnosis present

## 2020-05-30 DIAGNOSIS — R1310 Dysphagia, unspecified: Secondary | ICD-10-CM | POA: Diagnosis present

## 2020-05-30 DIAGNOSIS — Z8249 Family history of ischemic heart disease and other diseases of the circulatory system: Secondary | ICD-10-CM | POA: Diagnosis not present

## 2020-05-30 DIAGNOSIS — J9811 Atelectasis: Secondary | ICD-10-CM | POA: Diagnosis present

## 2020-05-30 DIAGNOSIS — M4854XA Collapsed vertebra, not elsewhere classified, thoracic region, initial encounter for fracture: Secondary | ICD-10-CM | POA: Diagnosis present

## 2020-05-30 DIAGNOSIS — I209 Angina pectoris, unspecified: Secondary | ICD-10-CM

## 2020-05-30 DIAGNOSIS — Z951 Presence of aortocoronary bypass graft: Secondary | ICD-10-CM | POA: Diagnosis not present

## 2020-05-30 DIAGNOSIS — J449 Chronic obstructive pulmonary disease, unspecified: Secondary | ICD-10-CM

## 2020-05-30 DIAGNOSIS — I471 Supraventricular tachycardia: Secondary | ICD-10-CM | POA: Diagnosis not present

## 2020-05-30 DIAGNOSIS — J90 Pleural effusion, not elsewhere classified: Secondary | ICD-10-CM | POA: Diagnosis not present

## 2020-05-30 DIAGNOSIS — I4510 Unspecified right bundle-branch block: Secondary | ICD-10-CM | POA: Diagnosis present

## 2020-05-30 DIAGNOSIS — D62 Acute posthemorrhagic anemia: Secondary | ICD-10-CM | POA: Diagnosis not present

## 2020-05-30 DIAGNOSIS — Z20822 Contact with and (suspected) exposure to covid-19: Secondary | ICD-10-CM | POA: Diagnosis present

## 2020-05-30 DIAGNOSIS — Z8501 Personal history of malignant neoplasm of esophagus: Secondary | ICD-10-CM | POA: Diagnosis not present

## 2020-05-30 DIAGNOSIS — E8770 Fluid overload, unspecified: Secondary | ICD-10-CM | POA: Diagnosis present

## 2020-05-30 DIAGNOSIS — J690 Pneumonitis due to inhalation of food and vomit: Secondary | ICD-10-CM | POA: Diagnosis present

## 2020-05-30 DIAGNOSIS — Z87442 Personal history of urinary calculi: Secondary | ICD-10-CM | POA: Diagnosis not present

## 2020-05-30 DIAGNOSIS — Z01810 Encounter for preprocedural cardiovascular examination: Secondary | ICD-10-CM | POA: Diagnosis not present

## 2020-05-30 HISTORY — PX: LEFT HEART CATH AND CORONARY ANGIOGRAPHY: CATH118249

## 2020-05-30 LAB — COMPREHENSIVE METABOLIC PANEL
ALT: 17 U/L (ref 0–44)
AST: 16 U/L (ref 15–41)
Albumin: 3.2 g/dL — ABNORMAL LOW (ref 3.5–5.0)
Alkaline Phosphatase: 48 U/L (ref 38–126)
Anion gap: 8 (ref 5–15)
BUN: 11 mg/dL (ref 8–23)
CO2: 26 mmol/L (ref 22–32)
Calcium: 8.6 mg/dL — ABNORMAL LOW (ref 8.9–10.3)
Chloride: 105 mmol/L (ref 98–111)
Creatinine, Ser: 1.03 mg/dL (ref 0.61–1.24)
GFR, Estimated: >60 mL/min (ref >60)
Glucose, Bld: 115 mg/dL — ABNORMAL HIGH (ref 70–99)
Potassium: 3.8 mmol/L (ref 3.5–5.1)
Sodium: 139 mmol/L (ref 135–145)
Total Bilirubin: 0.7 mg/dL (ref 0.3–1.2)
Total Protein: 6.6 g/dL (ref 6.5–8.1)

## 2020-05-30 LAB — CBC
HCT: 37.4 % — ABNORMAL LOW (ref 39.0–52.0)
Hemoglobin: 11.7 g/dL — ABNORMAL LOW (ref 13.0–17.0)
MCH: 29.1 pg (ref 26.0–34.0)
MCHC: 31.3 g/dL (ref 30.0–36.0)
MCV: 93 fL (ref 80.0–100.0)
Platelets: 267 10*3/uL (ref 150–400)
RBC: 4.02 MIL/uL — ABNORMAL LOW (ref 4.22–5.81)
RDW: 14.1 % (ref 11.5–15.5)
WBC: 12.6 10*3/uL — ABNORMAL HIGH (ref 4.0–10.5)
nRBC: 0 % (ref 0.0–0.2)

## 2020-05-30 LAB — LIPID PANEL
Cholesterol: 167 mg/dL (ref 0–200)
HDL: 54 mg/dL (ref >40)
LDL Cholesterol: 99 mg/dL (ref 0–99)
Total CHOL/HDL Ratio: 3.1 RATIO
Triglycerides: 72 mg/dL (ref <150)
VLDL: 14 mg/dL (ref 0–40)

## 2020-05-30 LAB — PHOSPHORUS: Phosphorus: 2.5 mg/dL (ref 2.5–4.6)

## 2020-05-30 LAB — PROTIME-INR
INR: 1 (ref 0.8–1.2)
Prothrombin Time: 13.2 seconds (ref 11.4–15.2)

## 2020-05-30 LAB — TROPONIN I (HIGH SENSITIVITY)
Troponin I (High Sensitivity): 14 ng/L (ref <18)
Troponin I (High Sensitivity): 13 ng/L (ref <18)

## 2020-05-30 LAB — HIV ANTIBODY (ROUTINE TESTING W REFLEX): HIV Screen 4th Generation wRfx: NONREACTIVE

## 2020-05-30 LAB — MRSA PCR SCREENING: MRSA by PCR: NEGATIVE

## 2020-05-30 LAB — SARS CORONAVIRUS 2 (TAT 6-24 HRS): SARS Coronavirus 2: NEGATIVE

## 2020-05-30 LAB — PROCALCITONIN: Procalcitonin: <0.1 ng/mL

## 2020-05-30 LAB — MAGNESIUM: Magnesium: 1.8 mg/dL (ref 1.7–2.4)

## 2020-05-30 LAB — APTT: aPTT: 33 seconds (ref 24–36)

## 2020-05-30 SURGERY — LEFT HEART CATH AND CORONARY ANGIOGRAPHY
Anesthesia: LOCAL

## 2020-05-30 MED ORDER — ENOXAPARIN SODIUM 40 MG/0.4ML IJ SOSY: 40.0000 mg | PREFILLED_SYRINGE | INTRAMUSCULAR | Status: DC

## 2020-05-30 MED ORDER — VERAPAMIL HCL 2.5 MG/ML IV SOLN
INTRAVENOUS | Status: AC
  Filled 2020-05-30: qty 2

## 2020-05-30 MED ORDER — ALBUTEROL SULFATE HFA 108 (90 BASE) MCG/ACT IN AERS
1.0000 | INHALATION_SPRAY | Freq: Four times a day (QID) | RESPIRATORY_TRACT | Status: DC | PRN
  Filled 2020-05-30: qty 6.7

## 2020-05-30 MED ORDER — HEPARIN (PORCINE) 25000 UT/250ML-% IV SOLN
1450.0000 [IU]/h | INTRAVENOUS | Status: DC
  Administered 2020-05-31: 950 [IU]/h via INTRAVENOUS
  Administered 2020-05-31: 1050 [IU]/h via INTRAVENOUS
  Administered 2020-06-01: 1450 [IU]/h via INTRAVENOUS
  Filled 2020-05-30 (×2): qty 250

## 2020-05-30 MED ORDER — SODIUM CHLORIDE 0.9 % IV SOLN
INTRAVENOUS | Status: DC
Start: 2020-05-30 — End: 2020-05-30

## 2020-05-30 MED ORDER — SODIUM CHLORIDE 0.9 % IV SOLN: 250.0000 mL | INTRAVENOUS | Status: DC | PRN

## 2020-05-30 MED ORDER — FENTANYL CITRATE (PF) 100 MCG/2ML IJ SOLN
INTRAMUSCULAR | Status: AC
  Filled 2020-05-30: qty 2

## 2020-05-30 MED ORDER — HEPARIN SODIUM (PORCINE) 1000 UNIT/ML IJ SOLN
INTRAMUSCULAR | Status: AC
  Filled 2020-05-30: qty 1

## 2020-05-30 MED ORDER — SODIUM CHLORIDE 0.9 % IV SOLN
3.0000 g | Freq: Four times a day (QID) | INTRAVENOUS | Status: AC
  Administered 2020-05-30 – 2020-06-04 (×21): 3 g via INTRAVENOUS
  Filled 2020-05-30: qty 8
  Filled 2020-05-30: qty 3
  Filled 2020-05-30: qty 8
  Filled 2020-05-30: qty 3
  Filled 2020-05-30: qty 8
  Filled 2020-05-30: qty 3
  Filled 2020-05-30: qty 8
  Filled 2020-05-30 (×2): qty 3
  Filled 2020-05-30: qty 8
  Filled 2020-05-30: qty 3
  Filled 2020-05-30: qty 8
  Filled 2020-05-30: qty 3
  Filled 2020-05-30 (×2): qty 8
  Filled 2020-05-30: qty 3
  Filled 2020-05-30 (×2): qty 8
  Filled 2020-05-30: qty 3
  Filled 2020-05-30: qty 8
  Filled 2020-05-30 (×2): qty 3
  Filled 2020-05-30: qty 8
  Filled 2020-05-30 (×5): qty 3
  Filled 2020-05-30: qty 8
  Filled 2020-05-30: qty 3

## 2020-05-30 MED ORDER — HEPARIN SODIUM (PORCINE) 1000 UNIT/ML IJ SOLN
INTRAMUSCULAR | Status: DC | PRN
  Administered 2020-05-30: 4000 [IU] via INTRAVENOUS

## 2020-05-30 MED ORDER — IRBESARTAN 75 MG PO TABS
75.0000 mg | ORAL_TABLET | Freq: Every day | ORAL | Status: DC
  Administered 2020-05-30 – 2020-06-01 (×3): 75 mg via ORAL
  Filled 2020-05-30 (×4): qty 1

## 2020-05-30 MED ORDER — MIDAZOLAM HCL 2 MG/2ML IJ SOLN
INTRAMUSCULAR | Status: DC | PRN
  Administered 2020-05-30: 2 mg via INTRAVENOUS

## 2020-05-30 MED ORDER — CHLORHEXIDINE GLUCONATE CLOTH 2 % EX PADS
6.0000 | MEDICATED_PAD | Freq: Every day | CUTANEOUS | Status: DC
  Administered 2020-05-30 – 2020-05-31 (×2): 6 via TOPICAL

## 2020-05-30 MED ORDER — SODIUM CHLORIDE 0.9% FLUSH: 3.0000 mL | INTRAVENOUS | Status: DC | PRN

## 2020-05-30 MED ORDER — ROSUVASTATIN CALCIUM 20 MG PO TABS
40.0000 mg | ORAL_TABLET | Freq: Every day | ORAL | Status: DC
  Administered 2020-05-30 – 2020-06-07 (×8): 40 mg via ORAL
  Filled 2020-05-30 (×8): qty 2

## 2020-05-30 MED ORDER — HEPARIN (PORCINE) IN NACL 1000-0.9 UT/500ML-% IV SOLN
INTRAVENOUS | Status: AC
  Filled 2020-05-30: qty 500

## 2020-05-30 MED ORDER — HEPARIN (PORCINE) 25000 UT/250ML-% IV SOLN
900.0000 [IU]/h | INTRAVENOUS | Status: DC
  Administered 2020-05-30: 900 [IU]/h via INTRAVENOUS
  Filled 2020-05-30: qty 250

## 2020-05-30 MED ORDER — PANTOPRAZOLE SODIUM 40 MG PO TBEC
40.0000 mg | DELAYED_RELEASE_TABLET | Freq: Every day | ORAL | Status: DC
  Administered 2020-05-30 – 2020-05-31 (×2): 40 mg via ORAL
  Filled 2020-05-30 (×3): qty 1

## 2020-05-30 MED ORDER — FENTANYL CITRATE (PF) 100 MCG/2ML IJ SOLN
INTRAMUSCULAR | Status: DC | PRN
  Administered 2020-05-30: 25 ug via INTRAVENOUS

## 2020-05-30 MED ORDER — GUAIFENESIN-DM 100-10 MG/5ML PO SYRP
5.0000 mL | ORAL_SOLUTION | ORAL | Status: DC | PRN
  Administered 2020-05-30 – 2020-06-01 (×6): 5 mL via ORAL
  Filled 2020-05-30 (×6): qty 5

## 2020-05-30 MED ORDER — HEPARIN BOLUS VIA INFUSION
4000.0000 [IU] | Freq: Once | INTRAVENOUS | Status: AC
  Administered 2020-05-30: 4000 [IU] via INTRAVENOUS
  Filled 2020-05-30: qty 4000

## 2020-05-30 MED ORDER — VERAPAMIL HCL 2.5 MG/ML IV SOLN
INTRAVENOUS | Status: DC | PRN
  Administered 2020-05-30: 10 mL via INTRA_ARTERIAL

## 2020-05-30 MED ORDER — HEPARIN (PORCINE) IN NACL 1000-0.9 UT/500ML-% IV SOLN
INTRAVENOUS | Status: DC | PRN
  Administered 2020-05-30 (×2): 500 mL

## 2020-05-30 MED ORDER — NITROGLYCERIN 0.4 MG SL SUBL: 0.4000 mg | SUBLINGUAL_TABLET | SUBLINGUAL | Status: DC | PRN

## 2020-05-30 MED ORDER — SODIUM CHLORIDE 0.9% FLUSH
3.0000 mL | Freq: Two times a day (BID) | INTRAVENOUS | Status: DC
  Administered 2020-05-31 (×2): 3 mL via INTRAVENOUS

## 2020-05-30 MED ORDER — ASPIRIN EC 81 MG PO TBEC
81.0000 mg | DELAYED_RELEASE_TABLET | Freq: Every day | ORAL | Status: DC
  Administered 2020-05-30 – 2020-06-01 (×3): 81 mg via ORAL
  Filled 2020-05-30 (×3): qty 1

## 2020-05-30 MED ORDER — POTASSIUM CHLORIDE CRYS ER 20 MEQ PO TBCR
40.0000 meq | EXTENDED_RELEASE_TABLET | Freq: Once | ORAL | Status: AC
  Administered 2020-05-30: 40 meq via ORAL
  Filled 2020-05-30: qty 2

## 2020-05-30 MED ORDER — SODIUM CHLORIDE 0.9 % WEIGHT BASED INFUSION
1.0000 mL/kg/h | INTRAVENOUS | Status: AC
  Administered 2020-05-30 (×2): 1 mL/kg/h via INTRAVENOUS

## 2020-05-30 MED ORDER — ONDANSETRON HCL 4 MG/2ML IJ SOLN: 4.0000 mg | Freq: Four times a day (QID) | INTRAMUSCULAR | Status: DC | PRN

## 2020-05-30 MED ORDER — IOHEXOL 350 MG/ML SOLN
INTRAVENOUS | Status: DC | PRN
  Administered 2020-05-30: 50 mL

## 2020-05-30 MED ORDER — ACETAMINOPHEN 325 MG PO TABS: 650.0000 mg | ORAL_TABLET | ORAL | Status: DC | PRN

## 2020-05-30 MED ORDER — METOPROLOL TARTRATE 12.5 MG HALF TABLET
12.5000 mg | ORAL_TABLET | Freq: Two times a day (BID) | ORAL | Status: DC
  Administered 2020-05-30 – 2020-05-31 (×3): 12.5 mg via ORAL
  Filled 2020-05-30 (×3): qty 1

## 2020-05-30 MED ORDER — LIDOCAINE HCL (PF) 1 % IJ SOLN
INTRAMUSCULAR | Status: AC
  Filled 2020-05-30: qty 30

## 2020-05-30 MED ORDER — MIDAZOLAM HCL 2 MG/2ML IJ SOLN
INTRAMUSCULAR | Status: AC
  Filled 2020-05-30: qty 2

## 2020-05-30 MED ORDER — SODIUM CHLORIDE 0.9% FLUSH
3.0000 mL | Freq: Two times a day (BID) | INTRAVENOUS | Status: DC
Start: 2020-05-30 — End: 2020-06-02
  Administered 2020-05-30 – 2020-05-31 (×2): 3 mL via INTRAVENOUS

## 2020-05-30 SURGICAL SUPPLY — 9 items

## 2020-05-30 NOTE — Progress Notes (Signed)
[facility]CTS consulted for CABG evaluation. °

## 2020-05-30 NOTE — Plan of Care (Signed)

## 2020-05-30 NOTE — Progress Notes (Signed)
Patient was able to perform 1750 ml's on the incentive spirometer.

## 2020-05-30 NOTE — Progress Notes (Signed)
PROGRESS NOTE  James Wolf [name] DOB: [birth year] DOA: 05/29/2020 PCP: James Gilles, DO  Brief History:  70 year old male with a history of COPD, GERD, hypertension, hyperlipidemia presenting with chest pain.  He describes it as a burning pressure-like sensation.  He initially had an episode on 05/27/2020 after lifting some weights.  He stated the episode lasted about 15 minutes.  His chest pain recurred on the morning of 05/28/2020 after he was carrying some baskets of close upstairs.  He stated the episode lasted about 15 minutes again.  He had some shortness of breath.  Once again, the patient had another episode of chest discomfort on the evening of 05/28/2020 lasting over 2 hours.  He went to the emergency department at [facility].  Troponin was trended and was 15>76>101(per medical record from [facility] [place]).  He subsequently left AMA.   Notably, the patient states that he has had some intermittent chest discomfort in the past week lasting about 10 minutes in duration.  He feels like he has had some intermittent shortness of breath associated with this.  He denies any fevers, chills, nausea, vomiting or diarrhea, abdominal pain, dysuria, hematuria. Patient states that he had an aspirational type event about 1 week prior to this admission resulting in worsening cough in the past week.  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  This is led him to have occasional aspiration events.  He states that he has had a family history of premature coronary disease.  Cardiology was consulted to assist with management.  ED Course: In the emergency department, he was intermittently tachypneic and tachycardic. BP was 140/86 and O2 sat was 98% on room air. Work-up in the ED showed normal CBC except for leukocytosis and normal BMP except for hypokalemia and hyperglycemia. Troponin x 2- 16>13. Chest x-ray showed  patchy lung base opacity in the peripheries, leftgreater thanright, may be atelectasis or pneumonia. Chronic bronchial thickening and hyperinflation noted. Aspirin 324 mg x 1 was given. Hospitalist was asked to admit patient for further evaluation and management.  Assessment/Plan: Chest pain/Angina -Cardiology consult -Troponins 16>>14>>13 -Echocardiogram -Personally reviewed EKG--sinus rhythm, IRBBB -discussed with cardiology>>transfer to [facility] for heart cath  Aspiration pneumonitis -Personally reviewed chest x-ray--patchy basilar infiltrates, recurrent left -Started Unasyn during the hospitalization -[place] home with Augmentin x6 more days to complete 7 day course if discharged 05/31/20  Essential hypertension -Continue ARB  COPD -Stable on room air  Hypokalemia -Repleted -Magnesium 1.8  GERD -Continue PPI       Status is: Observation  The patient will require care spanning > 2 midnights and should be moved to inpatient because: Ongoing diagnostic testing needed not appropriate for outpatient work up  Dispo: The patient is from: Home              Anticipated d/c is to: Home              Patient currently is not medically stable to d/c.   Difficult to place patient No        Family Communication:   spouse at bedside 5/24  Consultants:  cardiology  Code Status:  FULL   DVT Prophylaxis:  IV Heparin    Procedures: As Listed in Progress Note Above  Antibiotics: None       Subjective: Patient denies fevers, chills, headache, chest pain,  nausea, vomiting, diarrhea, abdominal pain, dysuria, hematuria, hematochezia, and  melena. Has nonproductive cough   Objective: Vitals:   05/30/20 0032 05/30/20 0349 05/30/20 0951 05/30/20 1030  BP: (!) 155/76 126/83  130/80  Pulse: 88 88  78  Resp: 19 19    Temp: 98.2 F (36.8 C) 98.2 F (36.8 C)    TempSrc:      SpO2: 100% 96%    Weight:   78.2 kg    No intake or output data in the 24  hours ending 05/30/20 1147 Weight change:  Exam:   General:  Pt is alert, follows commands appropriately, not in acute distress  HEENT: No icterus, No thrush, No neck mass, [place]/AT  Cardiovascular: RRR, S1/S2, no rubs, no gallops  Respiratory: bibasilar rales; R>L  Abdomen: Soft/+BS, non tender, non distended, no guarding  Extremities: No edema, No lymphangitis, No petechiae, No rashes, no synovitis   Data Reviewed: I have personally reviewed following labs and imaging studies Basic Metabolic Panel: Recent Labs  Lab 05/29/20 1730 05/30/20 0422  NA 138 139  K 3.3* 3.8  CL 104 105  CO2 27 26  GLUCOSE 128* 115*  BUN 11 11  CREATININE 1.11 1.03  CALCIUM 8.7* 8.6*  MG  --  1.8  PHOS  --  2.5   Liver Function Tests: Recent Labs  Lab 05/30/20 0422  AST 16  ALT 17  ALKPHOS 48  BILITOT 0.7  PROT 6.6  ALBUMIN 3.2*   No results for input(s): LIPASE, AMYLASE in the last 168 hours. No results for input(s): AMMONIA in the last 168 hours. Coagulation Profile: Recent Labs  Lab 05/30/20 0422  INR 1.0   CBC: Recent Labs  Lab 05/29/20 1730 05/30/20 0422  WBC 13.0* 12.6*  HGB 13.0 11.7*  HCT 40.7 37.4*  MCV 92.5 93.0  PLT 262 267   Cardiac Enzymes: No results for input(s): CKTOTAL, CKMB, CKMBINDEX, TROPONINI in the last 168 hours. BNP: Invalid input(s): POCBNP CBG: No results for input(s): GLUCAP in the last 168 hours. HbA1C: No results for input(s): HGBA1C in the last 72 hours. Urine analysis:    Component Value Date/Time   COLORURINE YELLOW 03/22/2019 2320   APPEARANCEUR CLEAR 03/22/2019 2320   LABSPEC 1.015 03/22/2019 2320   PHURINE 6.0 03/22/2019 2320   GLUCOSEU 150 (A) 03/22/2019 2320   HGBUR SMALL (A) 03/22/2019 2320   BILIRUBINUR NEGATIVE 03/22/2019 2320   KETONESUR NEGATIVE 03/22/2019 2320   PROTEINUR NEGATIVE 03/22/2019 2320   NITRITE NEGATIVE 03/22/2019 2320   LEUKOCYTESUR NEGATIVE 03/22/2019 2320   Sepsis  Labs: @LABRCNTIP (procalcitonin:4,lacticidven:4) )No results found for this or any previous visit (from the past 240 hour(s)).   Scheduled Meds: . aspirin EC  81 mg Oral Daily  . heparin  4,000 Units Intravenous Once  . irbesartan  75 mg Oral Daily  . metoprolol tartrate  12.5 mg Oral BID  . pantoprazole  40 mg Oral Daily  . rosuvastatin  40 mg Oral q1800   Continuous Infusions: . sodium chloride 100 mL/hr at 05/30/20 1028  . ampicillin-sulbactam (UNASYN) IV 3 g (05/30/20 1034)  . heparin      Procedures/Studies: DG Chest 2 View  Result Date: 05/29/2020 CLINICAL DATA:  Chest pain. Patient reports he went to [facility], left AMA. EXAM: CHEST - 2 VIEW COMPARISON:  Most recent comparison 11/29/2018, CT 07/20/2017 FINDINGS: Stable heart size and mediastinal contours. Gastric pull-through. Chronic bronchial thickening and hyperinflation. Patchy lung base opacity in the peripheries, left greater than right. No large pleural effusion. No pneumothorax. Chronic right apical pleuroparenchymal scarring.  No acute osseous abnormalities are seen. Remote right rib fractures. IMPRESSION: 1. Patchy lung base opacity in the peripheries, left greater than right, may be atelectasis or pneumonia. 2. Chronic bronchial thickening and hyperinflation. 3. Gastric pull-through. Electronically Signed   By: Keith Rake M.D.   On: 05/29/2020 18:20    Orson Eva, DO  [facility]  If 7PM-7AM, please contact night-coverage [email or web address] Password [facility] 05/30/2020, 11:47 AM   LOS: 0 days

## 2020-05-30 NOTE — Progress Notes (Signed)
ANTICOAGULATION CONSULT NOTE - Initial Consult  Pharmacy Consult for heparin Indication: chest pain/ACS  Allergies  Allergen Reactions  . Zithromax [Azithromycin] Diarrhea    Causes pt's stomach to "tear up" does not want to take medication again.     Patient Measurements:   Heparin Dosing Weight:   Vital Signs: Temp: 98.2 F (36.8 C) (05/24 0349) BP: 126/83 (05/24 0349) Pulse Rate: 88 (05/24 0349)  Labs: Recent Labs    05/29/20 1730 05/29/20 2329 05/30/20 0137 05/30/20 0422  HGB 13.0  --   --  11.7*  HCT 40.7  --   --  37.4*  PLT 262  --   --  267  APTT  --   --   --  33  LABPROT  --   --   --  13.2  INR  --   --   --  1.0  CREATININE 1.11  --   --  1.03  TROPONINIHS 16 14 13   --     CrCl cannot be calculated (Unknown ideal weight.).   Medical History: Past Medical History:  Diagnosis Date  . Anemia   . Aspiration pneumonia ([place])   . Cancer ([place])    esophageal cancer 1995  . Chest pain   . COPD (chronic obstructive pulmonary disease) ([place])   . GERD (gastroesophageal reflux disease)   . History of kidney stones   . Hyperlipidemia   . Hypertension   . Joint pain   . Osteomyelitis ([place])   . Osteoporosis   . Rheumatic fever     Medications:  Medications Prior to Admission  Medication Sig Dispense Refill Last Dose  . albuterol (VENTOLIN HFA) 108 (90 Base) MCG/ACT inhaler Inhale 1-2 puffs into the lungs every 6 (six) hours as needed for wheezing or shortness of breath.     . B Complex Vitamins (VITAMIN B COMPLEX PO) Take 1 tablet by mouth daily.    Past Week at Unknown time  . cholecalciferol (VITAMIN D3) 25 MCG (1000 UNIT) tablet Take 1,000 Units by mouth daily.   Past Week at Unknown time  . diclofenac sodium (VOLTAREN) 1 % GEL Apply 2 g topically 2 (two) times daily as needed (pain).      Marland Kitchen esomeprazole (NEXIUM) 40 MG capsule Take 40 mg by mouth 2 (two) times daily.    05/29/2020 at Unknown time  . ibuprofen (ADVIL) 800 MG tablet Take 1 tablet (800 mg  total) by mouth every 8 (eight) hours as needed. 90 tablet 1 05/28/2020 at Unknown time  . telmisartan (MICARDIS) 20 MG tablet Take 20 mg by mouth daily.    05/29/2020 at Unknown time    Assessment: Pharmacy consulted to dose heparin in patient with chest pain/ACS.  Patient is not on anticoagulation prior to admission.   Goal of Therapy:  Heparin level 0.3-0.7 units/ml Monitor platelets by anticoagulation protocol: Yes   Plan:  Give 4000 units bolus x 1 Start heparin infusion at 900 units/hr Check anti-Xa level in 6-8 hours and daily while on heparin Continue to monitor H&H and platelets  Margot Ables, PharmD Clinical Pharmacist 05/30/2020 10:13 AM

## 2020-05-30 NOTE — Interval H&P Note (Signed)
History and Physical Interval Note:  05/30/2020 4:53 PM  GOVANI RADLOFF  has presented today for surgery, with the diagnosis of nstemi.  The various methods of treatment have been discussed with the patient and family. After consideration of risks, benefits and other options for treatment, the patient has consented to  Procedure(s): LEFT HEART CATH AND CORONARY ANGIOGRAPHY (N/A) as a surgical intervention.  The patient's history has been reviewed, patient examined, no change in status, stable for surgery.  I have reviewed the patient's chart and labs.  Questions were answered to the patient's satisfaction.   Cath Lab Visit (complete for each Cath Lab visit)  Clinical Evaluation Leading to the Procedure:   ACS: Yes.    Non-ACS:    Anginal Classification: CCS IV  Anti-ischemic medical therapy: No Therapy  Non-Invasive Test Results: No non-invasive testing performed  Prior CABG: No previous CABG        Collier [facility] 05/30/2020 4:53 PM

## 2020-05-30 NOTE — H&P (Signed)
History and Physical  CARSEN LEAF YHC:[id number] DOB: Jun 20, 1950 DOA: 05/29/2020  Referring physician: Truddie Hidden, MD PCP: Sherrilee Gilles, DO  Patient coming from: Home  Chief Complaint: Chest pain  HPI: James Wolf is a 70 y.o. male with medical history significant for hypertension, GERD, COPD history of left-sided rotator cuff surgery who presents to the emergency department due to complaints of chest pain.  Patient complains of having left upper chest/shoulder pain on Saturday (5/21) with radiation to the central chest which occurred shortly after lifting some dumbbells, so he thought the pain was due to working out.  Pain resolved after some rest.  Patient return on Sunday (5/22) in the same area with radiation to neck and was rated as 8-9/10 on pain scale, so he went to [facility], troponin was trended and was 15 > 76 > 101 (per medical record from [facility], [place]), his chest pain already resolved while still in waiting room in [place], [place].  He was going to be admitted, but patient signed out [place].  Patient endorsed cardiac disease in several family members and several deaths in the 58s (though he was not sure if deaths were related to cardiac failure) He came to the ED today for further evaluation.  Patient denies any chest pain today.  He denies shortness of breath, fever, chills, nausea, vomiting or abdominal pain.  ED Course:  In the emergency department, he was intermittently tachypneic and tachycardic.  BP was 140/86 and O2 sat was 98% on room air.  Work-up in the ED showed normal CBC except for leukocytosis and normal BMP except for hypokalemia and hyperglycemia. Troponin x 2- 16>13. Chest x-ray showed patchy lung base opacity in the peripheries, left greater than right, may be atelectasis or pneumonia.  Chronic bronchial thickening and hyperinflation noted. Aspirin 324 mg x 1 was given.  Hospitalist was asked to admit patient for further  evaluation and management.  Review of Systems: Constitutional: Negative for chills and fever.  HENT: Negative for ear pain and sore throat.   Eyes: Negative for pain and visual disturbance.  Respiratory: Positive for cough,  negative for chest tightness and shortness of breath.   Cardiovascular: Positive for chest pain and negative for palpitations.  Gastrointestinal: Negative for abdominal pain and vomiting.  Endocrine: Negative for polyphagia and polyuria.  Genitourinary: Negative for decreased urine volume, dysuria, enuresis Musculoskeletal: Negative for arthralgias and back pain.  Skin: Negative for color change and rash.  Allergic/Immunologic: Negative for immunocompromised state.  Neurological: Negative for tremors, syncope, speech difficulty, weakness, light-headedness and headaches.  Hematological: Does not bruise/bleed easily.  All other systems reviewed and are negative   Past Medical History:  Diagnosis Date  . Anemia   . Aspiration pneumonia ([place])   . Cancer ([place])    esophageal cancer 1995  . Chest pain   . COPD (chronic obstructive pulmonary disease) ([place])   . GERD (gastroesophageal reflux disease)   . History of kidney stones   . Hyperlipidemia   . Hypertension   . Joint pain   . Osteomyelitis ([place])   . Osteoporosis   . Rheumatic fever    Past Surgical History:  Procedure Laterality Date  . CYSTOSCOPY/RETROGRADE/URETEROSCOPY/STONE EXTRACTION WITH BASKET    . partial esophageal ejectory    . SHOULDER OPEN ROTATOR CUFF REPAIR Left 10/26/2019   Procedure: ROTATOR CUFF REPAIR SHOULDER OPEN LEFT;  Surgeon: James Civil, MD;  Location: [facility];  Service: Orthopedics;  Laterality: Left;  Social History:  reports that he quit smoking about 27 years ago. His smoking use included cigarettes. He has a 25.00 pack-year smoking history. He has never used smokeless tobacco. He reports previous alcohol use. He reports that he does not use drugs.   Allergies   Allergen Reactions  . Zithromax [Azithromycin] Diarrhea    Causes pt's stomach to "tear up" does not want to take medication again.     No family history on file.   Prior to Admission medications   Medication Sig Start Date End Date Taking? Authorizing Provider  albuterol (VENTOLIN HFA) 108 (90 Base) MCG/ACT inhaler Inhale 1-2 puffs into the lungs every 6 (six) hours as needed for wheezing or shortness of breath.   Yes [provider]  B Complex Vitamins (VITAMIN B COMPLEX PO) Take 1 tablet by mouth daily.  03/10/07  Yes [provider]  cholecalciferol (VITAMIN D3) 25 MCG (1000 UNIT) tablet Take 1,000 Units by mouth daily.   Yes [provider]  diclofenac sodium (VOLTAREN) 1 % GEL Apply 2 g topically 2 (two) times daily as needed (pain).  09/24/15  Yes [provider]  esomeprazole (NEXIUM) 40 MG capsule Take 40 mg by mouth 2 (two) times daily.  05/26/17  Yes [provider]  ibuprofen (ADVIL) 800 MG tablet Take 1 tablet (800 mg total) by mouth every 8 (eight) hours as needed. 10/26/19  Yes James Civil, MD  telmisartan (MICARDIS) 20 MG tablet Take 20 mg by mouth daily.  06/30/17  Yes [provider]    Physical Exam: BP (!) 155/76   Pulse 88   Temp 98.2 F (36.8 C)   Resp 19   SpO2 100%   . General: 70 y.o. year-old male well developed well nourished in no acute distress.  Alert and oriented x3. James Wolf HEENT: NCAT, EOMI . Neck: Supple, trachea medial . Cardiovascular: Regular rate and rhythm with no rubs or gallops.  No thyromegaly or JVD noted.  No lower extremity edema. 2/4 pulses in all 4 extremities. James Wolf Respiratory: Notable coughing during exam.  Clear to auscultation with no wheezes or rales. Good inspiratory effort. . Abdomen: Soft, nontender nondistended with normal bowel sounds x4 quadrants. . Muskuloskeletal: No cyanosis, clubbing or edema noted bilaterally . Neuro: CN II-XII intact, strength 5/5 x 4, sensation, reflexes  intact . Skin: No ulcerative lesions noted or rashes . Psychiatry: Judgement and insight appear normal. Mood is appropriate for condition and setting          Labs on Admission:  Basic Metabolic Panel: Recent Labs  Lab 05/29/20 1730  NA 138  K 3.3*  CL 104  CO2 27  GLUCOSE 128*  BUN 11  CREATININE 1.11  CALCIUM 8.7*   Liver Function Tests: No results for input(s): AST, ALT, ALKPHOS, BILITOT, PROT, ALBUMIN in the last 168 hours. No results for input(s): LIPASE, AMYLASE in the last 168 hours. No results for input(s): AMMONIA in the last 168 hours. CBC: Recent Labs  Lab 05/29/20 1730  WBC 13.0*  HGB 13.0  HCT 40.7  MCV 92.5  PLT 262   Cardiac Enzymes: No results for input(s): CKTOTAL, CKMB, CKMBINDEX, TROPONINI in the last 168 hours.  BNP (last 3 results) No results for input(s): BNP in the last 8760 hours.  ProBNP (last 3 results) No results for input(s): PROBNP in the last 8760 hours.  CBG: No results for input(s): GLUCAP in the last 168 hours.  Radiological Exams on Admission: DG Chest 2 View  Result Date: 05/29/2020 CLINICAL DATA:  Chest pain. Patient reports he went to [facility], left AMA. EXAM: CHEST - 2 VIEW COMPARISON:  Most recent comparison 11/29/2018, CT 07/20/2017 FINDINGS: Stable heart size and mediastinal contours. Gastric pull-through. Chronic bronchial thickening and hyperinflation. Patchy lung base opacity in the peripheries, left greater than right. No large pleural effusion. No pneumothorax. Chronic right apical pleuroparenchymal scarring. No acute osseous abnormalities are seen. Remote right rib fractures. IMPRESSION: 1. Patchy lung base opacity in the peripheries, left greater than right, may be atelectasis or pneumonia. 2. Chronic bronchial thickening and hyperinflation. 3. Gastric pull-through. Electronically Signed   By: Keith Rake M.D.   On: 05/29/2020 18:20    EKG: I independently viewed the EKG done and my findings are as  followed: Normal sinus rhythm at a rate of 82 bpm  Assessment/Plan Present on Admission: . Chest pain . Hypertension . COPD (chronic obstructive pulmonary disease) (HCC)  Principal Problem:   Chest pain Active Problems:   Hypertension   COPD (chronic obstructive pulmonary disease) (HCC)   Leukocytosis   Hypokalemia   GERD (gastroesophageal reflux disease)   Chest pain rule out ACS Patient had chest pain with elevated troponin when he presented to [facility] [place], [place] over the weekend, he refused an admission and left AMA. Heart score = 4 Continue telemetry Troponins 16 > 13 EKG showed normal sinus rhythm at a rate of 82 bpm Cardiology will be consulted to help decide if Stress test is needed in am Versus other diagnostic modalities.  Continue aspirin, nitroglycerin as needed.  Leukocytosis possibly reactive WBC 13, no obvious chest process at this time Continue to monitor WBC with morning labs  Questionable pneumonia vs atelectasis Chest x-ray showed patchy lung base opacity in the peripheries, left greater than right, may be atelectasis or pneumonia.  Patient has elevated WBC, though he denies fever or shortness of breath.  Cough was dry and states that it was related to reflux. Continue Robitussin and incentive spirometry Procalcitonin will be checked prior to starting antibiotics  Hypokalemia K= 3.3, this will be replenished  GERD Continue home Nexium; patient states that Protonix does not work for him  Essential hypertension Continue Avapro  COPD Continue Ventolin per home regimen  DVT prophylaxis: Lovenox  Code Status: Full code  Family Communication: Wife at bedside (all questions answered to satisfaction)  Disposition Plan:  Patient is from:                        home Anticipated DC to:                    home Anticipated DC date:               1day Anticipated DC barriers:          Patient requires inpatient management to rule out  ACS.   Consults called: Cardiology  Admission status: Observation    Bernadette Hoit MD [facility]  05/30/2020, 3:08 AM

## 2020-05-30 NOTE — H&P (View-Only) (Signed)
Cardiology Consultation:   Patient ID: James Wolf MRN: [id number]; DOB: 1950/05/29  Admit date: 05/29/2020 Date of Consult: 05/30/2020  PCP:  Sherrilee Gilles, DO   [facility] Providers Cardiologist: New to [facility] - Followed by Dr. Sherlynn Carbon in [place], [place]  Patient Profile:   James Wolf is a 70 y.o. male with past medical history of HTN, prediabetes (diet-controlled), COPD and history of esophageal cancer who is being seen today for the evaluation of chest pain at the request of Dr. Josephine Cables.  History of Present Illness:   [name] presented to [facility] ED on 05/29/2020 for evaluation of chest pain after having left AMA from [facility] for similar symptoms on 05/28/2020. By review of the H&P, Troponin values from [facility] had trended up to 15, 76 and 101 but he left AMA due to chest pain resolving prior to admission.   In talking with the patient today, he reports having rotator cuff surgery in 10/2019 and has been trying to increase his activity and lift weights but starting on Saturday he had pain along his left pectoral region which radiated into his left arm and jaw. Had performed exercises previously without symptoms and pain lasted for several minutes then resolved with rest. On Sunday, he had recurrent chest pain with associated radiating pain to his left arm and jaw which occurred while climbing steps and carrying groceries. Symptoms again resolved within 10 minutes. That night, he had recurrent pain while sitting in his recliner and this prompted him to go to [facility]. Says he did not remain admitted given concerns for the care he would receive while there.   He is followed by Dr. Sherlynn Carbon (Cardiology based out of [place], [place]) but says he has never required a catheterization or coronary stent. Reports his last stress test was 10-15 + years ago. He does have HTN and borderline HLD and pre-diaebtes (diet-controlled). He is a former smoker but quit 20+ years ago. Reports a  family history of CAD with his dad having an MI at age 60.   Initial labs show WBC 13.0, Hgb 13.0, platelets 262, Na+ 138, K+ 3.3 and creatinine 1.11. Mg 1.8. COVID pending. Initial Hs Troponin 16 with repeat values of 14 and 13. CXR showed a patchy opacity in the lung bases, left greater than right and may be atelectasis or PNA along with chronic bronchial thickening and hyperinflation. EKG shows NSR, HR 82 with incomplete RBBB and slight TWI along Lead III but overall similar to prior tracings.   He has been started on Unasyn for possible aspiration pneumonitis with plans for Augmentin at discharge per the admitting team.    Past Medical History:  Diagnosis Date  . Anemia   . Aspiration pneumonia ([place])   . Cancer ([place])    esophageal cancer 1995  . Chest pain   . COPD (chronic obstructive pulmonary disease) ([place])   . GERD (gastroesophageal reflux disease)   . History of kidney stones   . Hyperlipidemia   . Hypertension   . Joint pain   . Osteomyelitis ([place])   . Osteoporosis   . Rheumatic fever     Past Surgical History:  Procedure Laterality Date  . CYSTOSCOPY/RETROGRADE/URETEROSCOPY/STONE EXTRACTION WITH BASKET    . partial esophageal ejectory    . SHOULDER OPEN ROTATOR CUFF REPAIR Left 10/26/2019   Procedure: ROTATOR CUFF REPAIR SHOULDER OPEN LEFT;  Surgeon: [name], MD;  Location: [facility];  Service: Orthopedics;  Laterality: Left;     Home Medications:  Prior to Admission medications   Medication Sig Start Date End Date Taking? Authorizing Provider  albuterol (VENTOLIN HFA) 108 (90 Base) MCG/ACT inhaler Inhale 1-2 puffs into the lungs every 6 (six) hours as needed for wheezing or shortness of breath.   Yes [provider]  B Complex Vitamins (VITAMIN B COMPLEX PO) Take 1 tablet by mouth daily.  03/10/07  Yes [provider]  cholecalciferol (VITAMIN D3) 25 MCG (1000 UNIT) tablet Take 1,000 Units by mouth daily.   Yes [provider]   diclofenac sodium (VOLTAREN) 1 % GEL Apply 2 g topically 2 (two) times daily as needed (pain).  09/24/15  Yes [provider]  esomeprazole (NEXIUM) 40 MG capsule Take 40 mg by mouth 2 (two) times daily.  05/26/17  Yes [provider]  ibuprofen (ADVIL) 800 MG tablet Take 1 tablet (800 mg total) by mouth every 8 (eight) hours as needed. 10/26/19  Yes [name], MD  telmisartan (MICARDIS) 20 MG tablet Take 20 mg by mouth daily.  06/30/17  Yes [provider]    Inpatient Medications: Scheduled Meds: . aspirin EC  81 mg Oral Daily  . irbesartan  75 mg Oral Daily  . metoprolol tartrate  12.5 mg Oral BID  . pantoprazole  40 mg Oral Daily  . rosuvastatin  40 mg Oral q1800   Continuous Infusions: . sodium chloride    . ampicillin-sulbactam (UNASYN) IV     PRN Meds: albuterol, guaiFENesin-dextromethorphan, nitroGLYCERIN  Allergies:    Allergies  Allergen Reactions  . Zithromax [Azithromycin] Diarrhea    Causes pt's stomach to "tear up" does not want to take medication again.     Social History:   Social History   Socioeconomic History  . Marital status: Married    Spouse name: Not on file  . Number of children: Not on file  . Years of education: Not on file  . Highest education level: Not on file  Occupational History  . Not on file  Tobacco Use  . Smoking status: Former Smoker    Packs/day: 1.00    Years: 25.00    Pack years: 25.00    Types: Cigarettes    Quit date: 02/19/1993    Years since quitting: 27.2  . Smokeless tobacco: Never Used  Vaping Use  . Vaping Use: Never used  Substance and Sexual Activity  . Alcohol use: Not Currently    Comment: occ  . Drug use: Never  . Sexual activity: Yes  Other Topics Concern  . Not on file  Social History Narrative  . Not on file   Social Determinants of Health   Financial Resource Strain: Not on file  Food Insecurity: Not on file  Transportation Needs: Not on file  Physical  Activity: Not on file  Stress: Not on file  Social Connections: Not on file  Intimate Partner Violence: Not on file    Family History:    Family History  Problem Relation Age of Onset  . CAD Father   . CVA Father      ROS:  Please see the history of present illness.   All other ROS reviewed and negative.     Physical Exam/Data:   Vitals:   05/29/20 2300 05/30/20 0032 05/30/20 0349 05/30/20 0951  BP: (!) 148/90 (!) 155/76 126/83   Pulse: 90 88 88   Resp: 17 19 19    Temp:  98.2 F (36.8 C) 98.2 F (36.8 C)   TempSrc:  SpO2: 99% 100% 96%   Weight:    78.2 kg   No intake or output data in the 24 hours ending 05/30/20 0954 Last 3 Weights 05/30/2020 03/16/2020 01/17/2020  Weight (lbs) 172 lb 4.8 oz 174 lb 171 lb  Weight (kg) 78.155 kg 78.926 kg 77.565 kg     Body mass index is 28.24 kg/m.  General:  Well nourished, well developed male appearing in no acute distress. HEENT: normal Lymph: no adenopathy Neck: no JVD Endocrine:  No thryomegaly Vascular: No carotid bruits; FA pulses 2+ bilaterally without bruits  Cardiac:  normal S1, S2; RRR; no murmur. Lungs:  clear to auscultation bilaterally, no wheezing, rhonchi or rales  Abd: soft, nontender, no hepatomegaly  Ext: no lower extremity edema. Musculoskeletal:  No deformities, BUE and BLE strength normal and equal Skin: warm and dry  Neuro:  CNs 2-12 intact, no focal abnormalities noted Psych:  Normal affect   EKG:  The EKG was personally reviewed and demonstrates: NSR, HR 82 with incomplete RBBB and slight TWI along Lead III but overall similar to prior tracings.   Telemetry:  Telemetry was personally reviewed and demonstrates: NSR, HR in 80's to 90's. No significant arrhythmias.   Relevant CV Studies:  Echocardiogram: Pending  Laboratory Data:  High Sensitivity Troponin:   Recent Labs  Lab 05/29/20 1730 05/29/20 2329 05/30/20 0137  TROPONINIHS 16 14 13      Chemistry Recent Labs  Lab 05/29/20 1730  05/30/20 0422  NA 138 139  K 3.3* 3.8  CL 104 105  CO2 27 26  GLUCOSE 128* 115*  BUN 11 11  CREATININE 1.11 1.03  CALCIUM 8.7* 8.6*  GFRNONAA >60 >60  ANIONGAP 7 8    Recent Labs  Lab 05/30/20 0422  PROT 6.6  ALBUMIN 3.2*  AST 16  ALT 17  ALKPHOS 48  BILITOT 0.7   Hematology Recent Labs  Lab 05/29/20 1730 05/30/20 0422  WBC 13.0* 12.6*  RBC 4.40 4.02*  HGB 13.0 11.7*  HCT 40.7 37.4*  MCV 92.5 93.0  MCH 29.5 29.1  MCHC 31.9 31.3  RDW 14.0 14.1  PLT 262 267   BNPNo results for input(s): BNP, PROBNP in the last 168 hours.  DDimer No results for input(s): DDIMER in the last 168 hours.   Radiology/Studies:  DG Chest 2 View  Result Date: 05/29/2020 CLINICAL DATA:  Chest pain. Patient reports he went to [facility], left AMA. EXAM: CHEST - 2 VIEW COMPARISON:  Most recent comparison 11/29/2018, CT 07/20/2017 FINDINGS: Stable heart size and mediastinal contours. Gastric pull-through. Chronic bronchial thickening and hyperinflation. Patchy lung base opacity in the peripheries, left greater than right. No large pleural effusion. No pneumothorax. Chronic right apical pleuroparenchymal scarring. No acute osseous abnormalities are seen. Remote right rib fractures. IMPRESSION: 1. Patchy lung base opacity in the peripheries, left greater than right, may be atelectasis or pneumonia. 2. Chronic bronchial thickening and hyperinflation. 3. Gastric pull-through. Electronically Signed   By: Keith Rake M.D.   On: 05/29/2020 18:20     Assessment and Plan:   1. Chest Pain concerning for Unstable Angina/Late-presenting NSTEMI - He presents with episodes of left pectoral chest pain for the past 4 days and his pain has radiated into his left shoulder and left jaw. Symptoms have occurred with activity and improve with rest. Recurrent symptoms overnight while walking to the restroom.  - At [place], his [facility] Troponin values were trending up to 15, 76 and 101 prior to him leaving AMA.  Enzymes are now negative (question if they peaked prior to his recurrent admission). EKG without acute ST changes.  - Reviewed with Dr. Timmothy Sours and will plan for a cardiac catheterization for definitive evaluation given his recent enzyme elevation and risk factors (risks and benefits of the procedure were reviewed with the patient by Dr. Timmothy Sours). He has been added to the cath board for today but this may be performed tomorrow pending availability. Start Heparin along with Crestor 40mg  daily and Lopressor 12.5mg  BID. Continue ASA 81mg  daily.   2. HTN - BP has been variable from 128/83 - 155/76 since admission. He remains on Irbesartan 75mg  daily. Will add Lopressor 12.5mg  BID as outlined above.   3. Prediabetes - He reports this is diet-controlled. Will add-on Hgb A1c to prior labs.   4. Aspiration Pneumonitis - He reports having this intermittently for 15+ years due to his prior esophageal surgery for cancer. He has been started on Unasyn and I have asked the Hospitalist team to leave recommendations for duration of antibiotics at the time of discharge.      Risk Assessment/Risk Scores:     TIMI Risk Score for Unstable Angina or Non-ST Elevation MI:   The patient's TIMI risk score is 4, which indicates a 20% risk of all cause mortality, new or recurrent myocardial infarction or need for urgent revascularization in the next 14 days.   For questions or updates, please contact [place] Please consult [email or web address] for contact info under    Signed, Erma Heritage, PA-C  05/30/2020 9:54 AM

## 2020-05-30 NOTE — Consult Note (Signed)
Cardiology Consultation:   Patient ID: James Wolf MRN: [id number]; DOB: 13-Mar-1950  Admit date: 05/29/2020 Date of Consult: 05/30/2020  PCP:  Sherrilee Gilles, DO   [facility] Providers Cardiologist: New to [facility] - Followed by Dr. Sherlynn Carbon in [place], [place]  Patient Profile:   James Wolf is a 70 y.o. male with past medical history of HTN, prediabetes (diet-controlled), COPD and history of esophageal cancer who is being seen today for the evaluation of chest pain at the request of Dr. Josephine Cables.  History of Present Illness:   James Wolf presented to [facility] ED on 05/29/2020 for evaluation of chest pain after having left AMA from [facility] for similar symptoms on 05/28/2020. By review of the H&P, Troponin values from [facility] had trended up to 15, 76 and 101 but he left AMA due to chest pain resolving prior to admission.   In talking with the patient today, he reports having rotator cuff surgery in 10/2019 and has been trying to increase his activity and lift weights but starting on Saturday he had pain along his left pectoral region which radiated into his left arm and jaw. Had performed exercises previously without symptoms and pain lasted for several minutes then resolved with rest. On Sunday, he had recurrent chest pain with associated radiating pain to his left arm and jaw which occurred while climbing steps and carrying groceries. Symptoms again resolved within 10 minutes. That night, he had recurrent pain while sitting in his recliner and this prompted him to go to [facility]. Says he did not remain admitted given concerns for the care he would receive while there.   He is followed by Dr. Sherlynn Carbon (Cardiology based out of [place], [place]) but says he has never required a catheterization or coronary stent. Reports his last stress test was 10-15 + years ago. He does have HTN and borderline HLD and pre-diaebtes (diet-controlled). He is a former smoker but quit 20+ years ago. Reports a  family history of CAD with his dad having an MI at age 28.   Initial labs show WBC 13.0, Hgb 13.0, platelets 262, Na+ 138, K+ 3.3 and creatinine 1.11. Mg 1.8. COVID pending. Initial Hs Troponin 16 with repeat values of 14 and 13. CXR showed a patchy opacity in the lung bases, left greater than right and may be atelectasis or PNA along with chronic bronchial thickening and hyperinflation. EKG shows NSR, HR 82 with incomplete RBBB and slight TWI along Lead III but overall similar to prior tracings.   He has been started on Unasyn for possible aspiration pneumonitis with plans for Augmentin at discharge per the admitting team.    Past Medical History:  Diagnosis Date  . Anemia   . Aspiration pneumonia ([place])   . Cancer ([place])    esophageal cancer 1995  . Chest pain   . COPD (chronic obstructive pulmonary disease) ([place])   . GERD (gastroesophageal reflux disease)   . History of kidney stones   . Hyperlipidemia   . Hypertension   . Joint pain   . Osteomyelitis ([place])   . Osteoporosis   . Rheumatic fever     Past Surgical History:  Procedure Laterality Date  . CYSTOSCOPY/RETROGRADE/URETEROSCOPY/STONE EXTRACTION WITH BASKET    . partial esophageal ejectory    . SHOULDER OPEN ROTATOR CUFF REPAIR Left 10/26/2019   Procedure: ROTATOR CUFF REPAIR SHOULDER OPEN LEFT;  Surgeon: [name], MD;  Location: [facility];  Service: Orthopedics;  Laterality: Left;     Home Medications:  Prior to Admission medications   Medication Sig Start Date End Date Taking? Authorizing Provider  albuterol (VENTOLIN HFA) 108 (90 Base) MCG/ACT inhaler Inhale 1-2 puffs into the lungs every 6 (six) hours as needed for wheezing or shortness of breath.   Yes [provider]  B Complex Vitamins (VITAMIN B COMPLEX PO) Take 1 tablet by mouth daily.  03/10/07  Yes [provider]  cholecalciferol (VITAMIN D3) 25 MCG (1000 UNIT) tablet Take 1,000 Units by mouth daily.   Yes [provider]   diclofenac sodium (VOLTAREN) 1 % GEL Apply 2 g topically 2 (two) times daily as needed (pain).  09/24/15  Yes [provider]  esomeprazole (NEXIUM) 40 MG capsule Take 40 mg by mouth 2 (two) times daily.  05/26/17  Yes [provider]  ibuprofen (ADVIL) 800 MG tablet Take 1 tablet (800 mg total) by mouth every 8 (eight) hours as needed. 10/26/19  Yes [name], MD  telmisartan (MICARDIS) 20 MG tablet Take 20 mg by mouth daily.  06/30/17  Yes [provider]    Inpatient Medications: Scheduled Meds: . aspirin EC  81 mg Oral Daily  . irbesartan  75 mg Oral Daily  . metoprolol tartrate  12.5 mg Oral BID  . pantoprazole  40 mg Oral Daily  . rosuvastatin  40 mg Oral q1800   Continuous Infusions: . sodium chloride    . ampicillin-sulbactam (UNASYN) IV     PRN Meds: albuterol, guaiFENesin-dextromethorphan, nitroGLYCERIN  Allergies:    Allergies  Allergen Reactions  . Zithromax [Azithromycin] Diarrhea    Causes pt's stomach to "tear up" does not want to take medication again.     Social History:   Social History   Socioeconomic History  . Marital status: Married    Spouse name: Not on file  . Number of children: Not on file  . Years of education: Not on file  . Highest education level: Not on file  Occupational History  . Not on file  Tobacco Use  . Smoking status: Former Smoker    Packs/day: 1.00    Years: 25.00    Pack years: 25.00    Types: Cigarettes    Quit date: 02/19/1993    Years since quitting: 27.2  . Smokeless tobacco: Never Used  Vaping Use  . Vaping Use: Never used  Substance and Sexual Activity  . Alcohol use: Not Currently    Comment: occ  . Drug use: Never  . Sexual activity: Yes  Other Topics Concern  . Not on file  Social History Narrative  . Not on file   Social Determinants of Health   Financial Resource Strain: Not on file  Food Insecurity: Not on file  Transportation Needs: Not on file  Physical  Activity: Not on file  Stress: Not on file  Social Connections: Not on file  Intimate Partner Violence: Not on file    Family History:    Family History  Problem Relation Age of Onset  . CAD Father   . CVA Father      ROS:  Please see the history of present illness.   All other ROS reviewed and negative.     Physical Exam/Data:   Vitals:   05/29/20 2300 05/30/20 0032 05/30/20 0349 05/30/20 0951  BP: (!) 148/90 (!) 155/76 126/83   Pulse: 90 88 88   Resp: 17 19 19    Temp:  98.2 F (36.8 C) 98.2 F (36.8 C)   TempSrc:  SpO2: 99% 100% 96%   Weight:    78.2 kg   No intake or output data in the 24 hours ending 05/30/20 0954 Last 3 Weights 05/30/2020 03/16/2020 01/17/2020  Weight (lbs) 172 lb 4.8 oz 174 lb 171 lb  Weight (kg) 78.155 kg 78.926 kg 77.565 kg     Body mass index is 28.24 kg/m.  General:  Well nourished, well developed male appearing in no acute distress. HEENT: normal Lymph: no adenopathy Neck: no JVD Endocrine:  No thryomegaly Vascular: No carotid bruits; FA pulses 2+ bilaterally without bruits  Cardiac:  normal S1, S2; RRR; no murmur. Lungs:  clear to auscultation bilaterally, no wheezing, rhonchi or rales  Abd: soft, nontender, no hepatomegaly  Ext: no lower extremity edema. Musculoskeletal:  No deformities, BUE and BLE strength normal and equal Skin: warm and dry  Neuro:  CNs 2-12 intact, no focal abnormalities noted Psych:  Normal affect   EKG:  The EKG was personally reviewed and demonstrates: NSR, HR 82 with incomplete RBBB and slight TWI along Lead III but overall similar to prior tracings.   Telemetry:  Telemetry was personally reviewed and demonstrates: NSR, HR in 80's to 90's. No significant arrhythmias.   Relevant CV Studies:  Echocardiogram: Pending  Laboratory Data:  High Sensitivity Troponin:   Recent Labs  Lab 05/29/20 1730 05/29/20 2329 05/30/20 0137  TROPONINIHS 16 14 13      Chemistry Recent Labs  Lab 05/29/20 1730  05/30/20 0422  NA 138 139  K 3.3* 3.8  CL 104 105  CO2 27 26  GLUCOSE 128* 115*  BUN 11 11  CREATININE 1.11 1.03  CALCIUM 8.7* 8.6*  GFRNONAA >60 >60  ANIONGAP 7 8    Recent Labs  Lab 05/30/20 0422  PROT 6.6  ALBUMIN 3.2*  AST 16  ALT 17  ALKPHOS 48  BILITOT 0.7   Hematology Recent Labs  Lab 05/29/20 1730 05/30/20 0422  WBC 13.0* 12.6*  RBC 4.40 4.02*  HGB 13.0 11.7*  HCT 40.7 37.4*  MCV 92.5 93.0  MCH 29.5 29.1  MCHC 31.9 31.3  RDW 14.0 14.1  PLT 262 267   BNPNo results for input(s): BNP, PROBNP in the last 168 hours.  DDimer No results for input(s): DDIMER in the last 168 hours.   Radiology/Studies:  DG Chest 2 View  Result Date: 05/29/2020 CLINICAL DATA:  Chest pain. Patient reports he went to [facility], left AMA. EXAM: CHEST - 2 VIEW COMPARISON:  Most recent comparison 11/29/2018, CT 07/20/2017 FINDINGS: Stable heart size and mediastinal contours. Gastric pull-through. Chronic bronchial thickening and hyperinflation. Patchy lung base opacity in the peripheries, left greater than right. No large pleural effusion. No pneumothorax. Chronic right apical pleuroparenchymal scarring. No acute osseous abnormalities are seen. Remote right rib fractures. IMPRESSION: 1. Patchy lung base opacity in the peripheries, left greater than right, may be atelectasis or pneumonia. 2. Chronic bronchial thickening and hyperinflation. 3. Gastric pull-through. Electronically Signed   By: Keith Rake M.D.   On: 05/29/2020 18:20     Assessment and Plan:   1. Chest Pain concerning for Unstable Angina/Late-presenting NSTEMI - He presents with episodes of left pectoral chest pain for the past 4 days and his pain has radiated into his left shoulder and left jaw. Symptoms have occurred with activity and improve with rest. Recurrent symptoms overnight while walking to the restroom.  - At [place], his [facility] Troponin values were trending up to 15, 76 and 101 prior to him leaving AMA.  Enzymes are now negative (question if they peaked prior to his recurrent admission). EKG without acute ST changes.  - Reviewed with Dr. Timmothy Sours and will plan for a cardiac catheterization for definitive evaluation given his recent enzyme elevation and risk factors (risks and benefits of the procedure were reviewed with the patient by Dr. Timmothy Sours). He has been added to the cath board for today but this may be performed tomorrow pending availability. Start Heparin along with Crestor 40mg  daily and Lopressor 12.5mg  BID. Continue ASA 81mg  daily.   2. HTN - BP has been variable from 128/83 - 155/76 since admission. He remains on Irbesartan 75mg  daily. Will add Lopressor 12.5mg  BID as outlined above.   3. Prediabetes - He reports this is diet-controlled. Will add-on Hgb A1c to prior labs.   4. Aspiration Pneumonitis - He reports having this intermittently for 15+ years due to his prior esophageal surgery for cancer. He has been started on Unasyn and I have asked the Hospitalist team to leave recommendations for duration of antibiotics at the time of discharge.      Risk Assessment/Risk Scores:     TIMI Risk Score for Unstable Angina or Non-ST Elevation MI:   The patient's TIMI risk score is 4, which indicates a 20% risk of all cause mortality, new or recurrent myocardial infarction or need for urgent revascularization in the next 14 days.   For questions or updates, please contact [place] Please consult [email or web address] for contact info under    Signed, Erma Heritage, PA-C  05/30/2020 9:54 AM

## 2020-05-30 NOTE — Progress Notes (Signed)
Received from [facility] by [facility] to cath lab holding area. Alert and oriented. Heparin infusing at 900 units/hr on arrival w/0.9NS at 100cc/hr

## 2020-05-30 NOTE — Progress Notes (Signed)
ANTICOAGULATION CONSULT NOTE - Follow Up Consult  Pharmacy Consult for IV Heparin Indication: chest pain/ACS  Allergies  Allergen Reactions  . Zithromax [Azithromycin] Diarrhea    Causes pt's stomach to "tear up" does not want to take medication again.     Patient Measurements: Weight: 78.2 kg (172 lb 4.8 oz)  Height: 65.5 inches Heparin Dosing Weight: 78.2 kg  Vital Signs: Temp: 98.6 F (37 C) (05/24 1400) Temp Source: Oral (05/24 1400) BP: 165/89 (05/24 1550) Pulse Rate: 73 (05/24 1550)  Labs: Recent Labs    05/29/20 1730 05/29/20 2329 05/30/20 0137 05/30/20 0422  HGB 13.0  --   --  11.7*  HCT 40.7  --   --  37.4*  PLT 262  --   --  267  APTT  --   --   --  33  LABPROT  --   --   --  13.2  INR  --   --   --  1.0  CREATININE 1.11  --   --  1.03  TROPONINIHS 16 14 13   --     Estimated Creatinine Clearance: 66.9 mL/min (by C-G formula based on SCr of 1.03 mg/dL).   Medical History: Past Medical History:  Diagnosis Date  . Anemia   . Aspiration pneumonia ([place])   . Cancer ([place])    esophageal cancer 1995  . Chest pain   . COPD (chronic obstructive pulmonary disease) ([place])   . GERD (gastroesophageal reflux disease)   . History of kidney stones   . Hyperlipidemia   . Hypertension   . Joint pain   . Osteomyelitis ([place])   . Osteoporosis   . Rheumatic fever     Assessment: Pharmacy was consulted to dose heparin in 70 yr old man with chest pain/ACS.  Patient was not on anticoagulation prior to admission.  Pt rec'd heparin 4000 units IV bolus X 1, followed by heparin infusion at 900 units/hr at [facility] prior to transfer to [facility] for cardiac cath.  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 CT surgery has been consulted for CABG.  Pharmacy has been consulted to resume heparin infusion 8 hrs after sheath removal (per cath procedure log, sheath was removed ~1730 PM).   Goal of  Therapy:  Heparin level 0.3-0.7 units/ml Monitor platelets by anticoagulation protocol: Yes   Plan:  Resume heparin infusion at 950 units/hr ~8 hrs after sheath removal (resume at 0130 AM on 05/31/20) Check heparin level 6 hrs after resuming heparin infusion Monitor daily heparin level, CBC Monitor for bleeding F/U CT surgery plans  Gillermina Hu, PharmD, BCPS, [facility] Clinical Pharmacist 05/30/2020 5:46 PM

## 2020-05-31 ENCOUNTER — Inpatient Hospital Stay (HOSPITAL_COMMUNITY): Payer: BC Managed Care – PPO

## 2020-05-31 ENCOUNTER — Encounter (HOSPITAL_COMMUNITY): Payer: Self-pay | Admitting: Cardiology

## 2020-05-31 DIAGNOSIS — E782 Mixed hyperlipidemia: Secondary | ICD-10-CM | POA: Diagnosis present

## 2020-05-31 DIAGNOSIS — Z01810 Encounter for preprocedural cardiovascular examination: Secondary | ICD-10-CM

## 2020-05-31 DIAGNOSIS — E876 Hypokalemia: Secondary | ICD-10-CM

## 2020-05-31 DIAGNOSIS — J690 Pneumonitis due to inhalation of food and vomit: Secondary | ICD-10-CM

## 2020-05-31 DIAGNOSIS — I214 Non-ST elevation (NSTEMI) myocardial infarction: Secondary | ICD-10-CM | POA: Diagnosis not present

## 2020-05-31 DIAGNOSIS — D72829 Elevated white blood cell count, unspecified: Secondary | ICD-10-CM

## 2020-05-31 DIAGNOSIS — I10 Essential (primary) hypertension: Secondary | ICD-10-CM | POA: Diagnosis not present

## 2020-05-31 DIAGNOSIS — I25110 Atherosclerotic heart disease of native coronary artery with unstable angina pectoris: Secondary | ICD-10-CM

## 2020-05-31 DIAGNOSIS — E785 Hyperlipidemia, unspecified: Secondary | ICD-10-CM

## 2020-05-31 DIAGNOSIS — I2510 Atherosclerotic heart disease of native coronary artery without angina pectoris: Secondary | ICD-10-CM | POA: Diagnosis present

## 2020-05-31 DIAGNOSIS — R079 Chest pain, unspecified: Secondary | ICD-10-CM | POA: Diagnosis not present

## 2020-05-31 LAB — CBC
HCT: 36.4 % — ABNORMAL LOW (ref 39.0–52.0)
Hemoglobin: 12 g/dL — ABNORMAL LOW (ref 13.0–17.0)
MCH: 29.6 pg (ref 26.0–34.0)
MCHC: 33 g/dL (ref 30.0–36.0)
MCV: 89.9 fL (ref 80.0–100.0)
Platelets: 256 10*3/uL (ref 150–400)
RBC: 4.05 MIL/uL — ABNORMAL LOW (ref 4.22–5.81)
RDW: 13.9 % (ref 11.5–15.5)
WBC: 11 10*3/uL — ABNORMAL HIGH (ref 4.0–10.5)
nRBC: 0 % (ref 0.0–0.2)

## 2020-05-31 LAB — ECHOCARDIOGRAM COMPLETE
Area-P 1/2: 3.85 cm2
S' Lateral: 3.4 cm
Single Plane A4C EF: 49.2 %
Weight: 2673.74 oz

## 2020-05-31 LAB — TROPONIN I (HIGH SENSITIVITY)
Troponin I (High Sensitivity): 24 ng/L — ABNORMAL HIGH (ref <18)
Troponin I (High Sensitivity): 19 ng/L — ABNORMAL HIGH (ref <18)

## 2020-05-31 LAB — HEMOGLOBIN A1C
Hgb A1c MFr Bld: 6.2 % — ABNORMAL HIGH (ref 4.8–5.6)
Mean Plasma Glucose: 131 mg/dL

## 2020-05-31 LAB — HEPARIN LEVEL (UNFRACTIONATED): Heparin Unfractionated: <0.1 IU/mL — ABNORMAL LOW (ref 0.30–0.70)

## 2020-05-31 MED ORDER — METOPROLOL TARTRATE 12.5 MG HALF TABLET
12.5000 mg | ORAL_TABLET | Freq: Once | ORAL | Status: AC
  Administered 2020-05-31: 12.5 mg via ORAL
  Filled 2020-05-31: qty 1

## 2020-05-31 MED ORDER — METOPROLOL TARTRATE 25 MG PO TABS
25.0000 mg | ORAL_TABLET | Freq: Two times a day (BID) | ORAL | Status: DC
  Administered 2020-05-31 – 2020-06-01 (×3): 25 mg via ORAL
  Filled 2020-05-31 (×3): qty 1

## 2020-05-31 MED FILL — Lidocaine HCl Local Preservative Free (PF) Inj 1%: INTRAMUSCULAR | Qty: 30 | Status: AC

## 2020-05-31 NOTE — Progress Notes (Signed)
CARDIAC REHAB PHASE I   Preop education completed with pt and wife. Pt with IS at bedside. Pt educated on importance of IS use, walks, and sternal precautions postop. Pt given in-the-tube sheet along with Cardiac Surgery booklet and OHS guidelines. Pt states wife is retiring and will be with him after discharge. Pt and wife deny further questions or concerns at this time. Will continue to follow.  [id number] Rufina Falco, RN BSN 05/31/2020 2:47 PM

## 2020-05-31 NOTE — Progress Notes (Signed)
Pre-CABG study completed.  Preliminary results relayed to Roxy Manns, MD.   See CV Proc for preliminary results report.   Darlin Coco, RDMS, RVT

## 2020-05-31 NOTE — Progress Notes (Signed)
Progress Note  Patient Name: James Wolf Date of Encounter: 05/31/2020  [facility] Cardiologist: None Mrsic, Orpah Cobb, MD   Subjective   No further chest pain since arrival.  Multiple questions about when he will have the surgery.  No other complaints.  No chest pain, pressure or dyspnea.  No PND, orthopnea or edema.  No palpitations.  Inpatient Medications    Scheduled Meds: . aspirin EC  81 mg Oral Daily  . Chlorhexidine Gluconate Cloth  6 each Topical Daily  . irbesartan  75 mg Oral Daily  . metoprolol tartrate  25 mg Oral BID  . pantoprazole  40 mg Oral Daily  . rosuvastatin  40 mg Oral q1800  . sodium chloride flush  3 mL Intravenous Q12H  . sodium chloride flush  3 mL Intravenous Q12H   Continuous Infusions: . sodium chloride    . sodium chloride    . ampicillin-sulbactam (UNASYN) IV Stopped (05/31/20 2041)  . heparin 1,050 Units/hr (05/31/20 2300)   PRN Meds: sodium chloride, sodium chloride, acetaminophen, albuterol, guaiFENesin-dextromethorphan, nitroGLYCERIN, ondansetron (ZOFRAN) IV, sodium chloride flush, sodium chloride flush   Vital Signs    Vitals:   05/31/20 2201 05/31/20 2300 05/31/20 2327 05/31/20 2332  BP: (!) 145/82 (!) 151/88 (!) 153/86   Pulse: 75 76 75   Resp:   (!) 28   Temp:    98.5 F (36.9 C)  TempSrc:    Oral  SpO2:  96% 93%   Weight:        Intake/Output Summary (Last 24 hours) at 05/31/2020 2357 Last data filed at 05/31/2020 2300 Gross per 24 hour  Intake 862.9 ml  Output 2450 ml  Net -1587.1 ml   Last 3 Weights 05/31/2020 05/30/2020 05/30/2020  Weight (lbs) 167 lb 1.7 oz 172 lb 4.6 oz 172 lb 4.8 oz  Weight (kg) 75.8 kg 78.15 kg 78.155 kg      Telemetry    Sinus rhythm mostly in the 80s to 90s- Personally Reviewed  ECG    No EKG done today.- Personally Reviewed  Physical Exam   GEN: No acute distress.   Resting comfortably in bed.  Somewhat jovial Neck: No JVD Cardiac: RRR, normal S1 and S2.  No murmurs, rubs, or  gallops.  Respiratory: Clear to auscultation bilaterally. GI: Soft, nontender, non-distended  MS: No edema; No deformity. Neuro:  Nonfocal  Psych: Normal affect   Labs    High Sensitivity Troponin:   Recent Labs  Lab 05/29/20 1730 05/29/20 2329 05/30/20 0137 05/31/20 0827 05/31/20 0957  TROPONINIHS 16 14 13  24* 19*      Chemistry Recent Labs  Lab 05/29/20 1730 05/30/20 0422  NA 138 139  K 3.3* 3.8  CL 104 105  CO2 27 26  GLUCOSE 128* 115*  BUN 11 11  CREATININE 1.11 1.03  CALCIUM 8.7* 8.6*  PROT  --  6.6  ALBUMIN  --  3.2*  AST  --  16  ALT  --  17  ALKPHOS  --  48  BILITOT  --  0.7  GFRNONAA >60 >60  ANIONGAP 7 8     Hematology Recent Labs  Lab 05/29/20 1730 05/30/20 0422 05/31/20 0112  WBC 13.0* 12.6* 11.0*  RBC 4.40 4.02* 4.05*  HGB 13.0 11.7* 12.0*  HCT 40.7 37.4* 36.4*  MCV 92.5 93.0 89.9  MCH 29.5 29.1 29.6  MCHC 31.9 31.3 33.0  RDW 14.0 14.1 13.9  PLT 262 267 256    BNPNo results for input(s):  BNP, PROBNP in the last 168 hours.   DDimer No results for input(s): DDIMER in the last 168 hours.   Radiology    CARDIAC CATHETERIZATION  Result Date: 05/30/2020  Ost LM to Dist LM lesion is 90% stenosed.  Mid LAD lesion is 90% stenosed.  Prox Cx lesion is 50% stenosed.  1st Mrg lesion is 70% stenosed.  Ost RCA to Prox RCA lesion is 50% stenosed.  Mid RCA lesion is 70% stenosed.  Dist RCA lesion is 70% stenosed.  LV end diastolic pressure is normal.  1. 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 Normal LVEDP Plan: Echo to assess LV. Recommend CT surgery consultation for CABG.   ECHOCARDIOGRAM COMPLETE  Result Date: 05/31/2020    ECHOCARDIOGRAM REPORT   Patient Name:   James Wolf Date of Exam: 05/31/2020 Medical Rec #:  [id number]        Height:       65.5 in Accession #:    [id number]       Weight:       167.1 lb Date of Birth:  [birth year]        BSA:          1.843 m Patient Age:    70 years         BP:           138/115 mmHg  Patient Gender: M                HR:           77 bpm. Exam Location:  Inpatient Procedure: 2D Echo Indications:    chest pain  History:        Patient has no prior history of Echocardiogram examinations.                 CAD, COPD; Risk Factors:Hypertension.  Sonographer:    Johny Chess Referring Phys: [id number] PETER M [place] IMPRESSIONS  1. Left ventricular ejection fraction, by estimation, is 60 to 65%. The left ventricle has normal function. The left ventricle has no regional wall motion abnormalities. Left ventricular diastolic parameters were normal.  2. Right ventricular systolic function is normal. The right ventricular size is normal. There is normal pulmonary artery systolic pressure.  3. The mitral valve is normal in structure. No evidence of mitral valve regurgitation. No evidence of mitral stenosis.  4. The aortic valve is tricuspid. Aortic valve regurgitation is not visualized. Mild aortic valve sclerosis is present, with no evidence of aortic valve stenosis.  5. The inferior vena cava is normal in size with greater than 50% respiratory variability, suggesting right atrial pressure of 3 mmHg. FINDINGS  Left Ventricle: Left ventricular ejection fraction, by estimation, is 60 to 65%. The left ventricle has normal function. The left ventricle has no regional wall motion abnormalities. The left ventricular internal cavity size was normal in size. There is  no left ventricular hypertrophy. Left ventricular diastolic parameters were normal. Normal left ventricular filling pressure. Right Ventricle: The right ventricular size is normal. No increase in right ventricular wall thickness. Right ventricular systolic function is normal. There is normal pulmonary artery systolic pressure. The tricuspid regurgitant velocity is 2.48 m/s, and  with an assumed right atrial pressure of 3 mmHg, the estimated right ventricular systolic pressure is 49.4 mmHg. Left Atrium: Left atrial size was normal in size. Right Atrium:  Right atrial size was normal in size. Pericardium: There is no evidence of pericardial effusion. Mitral Valve: The mitral  valve is normal in structure. No evidence of mitral valve regurgitation. No evidence of mitral valve stenosis. Tricuspid Valve: The tricuspid valve is normal in structure. Tricuspid valve regurgitation is mild . No evidence of tricuspid stenosis. Aortic Valve: The aortic valve is tricuspid. Aortic valve regurgitation is not visualized. Mild aortic valve sclerosis is present, with no evidence of aortic valve stenosis. Pulmonic Valve: The pulmonic valve was normal in structure. Pulmonic valve regurgitation is not visualized. No evidence of pulmonic stenosis. Aorta: The aortic root is normal in size and structure. Venous: The inferior vena cava is normal in size with greater than 50% respiratory variability, suggesting right atrial pressure of 3 mmHg. IAS/Shunts: No atrial level shunt detected by color flow Doppler.  LEFT VENTRICLE PLAX 2D LVIDd:         4.70 cm     Diastology LVIDs:         3.40 cm     LV e' medial:    8.16 cm/s LV PW:         0.80 cm     LV E/e' medial:  9.8 LV IVS:        0.80 cm     LV e' lateral:   9.90 cm/s LVOT diam:     1.90 cm     LV E/e' lateral: 8.1 LV SV:         45 LV SV Index:   24 LVOT Area:     2.84 cm  LV Volumes (MOD) LV vol d, MOD A4C: 72.8 ml LV vol s, MOD A4C: 37.0 ml LV SV MOD A4C:     72.8 ml RIGHT VENTRICLE             IVC RV S prime:     16.10 cm/s  IVC diam: 1.40 cm TAPSE (M-mode): 2.3 cm LEFT ATRIUM             Index       RIGHT ATRIUM           Index LA diam:        2.50 cm 1.36 cm/m  RA Area:     13.50 cm LA Vol (A2C):   40.3 ml 21.86 ml/m RA Volume:   32.10 ml  17.42 ml/m LA Vol (A4C):   46.0 ml 24.96 ml/m LA Biplane Vol: 45.1 ml 24.47 ml/m  AORTIC VALVE LVOT Vmax:   82.20 cm/s LVOT Vmean:  54.400 cm/s LVOT VTI:    0.159 m  AORTA Ao Root diam: 3.30 cm Ao Asc diam:  3.00 cm MITRAL VALVE               TRICUSPID VALVE MV Area (PHT): 3.85 cm    TR  Peak grad:   24.6 mmHg MV Decel Time: 197 msec    TR Vmax:        248.00 cm/s MV E velocity: 79.70 cm/s MV A velocity: 79.30 cm/s  SHUNTS MV E/A ratio:  1.01        Systemic VTI:  0.16 m                            Systemic Diam: 1.90 cm Dani Gobble Croitoru MD Electronically signed by Sanda Klein MD Signature Date/Time: 05/31/2020/1:59:53 PM    Final    VAS US DOPPLER PRE CABG  Result Date: 05/31/2020 PREOPERATIVE VASCULAR EVALUATION Patient Name:  James Wolf  Date of Exam:   05/31/2020 Medical Rec #: [id number]  Accession #:    [id number] Date of Birth: 1950-11-30         Patient Gender: M Patient Age:   69Y Exam Location:  [facility] Procedure:      VAS US DOPPLER PRE CABG Referring Phys: [place] --------------------------------------------------------------------------------  Indications:      Pre-CABG. Risk Factors:     Hypertension, hyperlipidemia, past history of smoking, prior                   MI, coronary artery disease. Comparison Study: No prior studies. Performing Technologist: Rogelia Rohrer RVT, RDMS Supporting Technologist: Darlin Coco RDMS,RVT  Examination Guidelines: A complete evaluation includes B-mode imaging, spectral Doppler, color Doppler, and power Doppler as needed of all accessible portions of each vessel. Bilateral testing is considered an integral part of a complete examination. Limited examinations for reoccurring indications may be performed as noted.  Right Carotid Findings: +----------+--------+--------+--------+-------------------+--------------------+           PSV cm/sEDV cm/sStenosisDescribe           Comments             +----------+--------+--------+--------+-------------------+--------------------+ CCA Prox  83      15              heterogenous                            +----------+--------+--------+--------+-------------------+--------------------+ CCA Distal67      23              calcific                                 +----------+--------+--------+--------+-------------------+--------------------+ ICA Prox  229     99      60-79%  hypoechoic,        Velocities may                                         heterogenous and   underestimate degree                                   irregular          of stenosis due to                                                        more proximal                                                             obstruction.         +----------+--------+--------+--------+-------------------+--------------------+ ICA Mid   246     118     80-99%  hypoechoic                              +----------+--------+--------+--------+-------------------+--------------------+ ICA [id number]  31                                                      +----------+--------+--------+--------+-------------------+--------------------+ ECA       135     20                                                      +----------+--------+--------+--------+-------------------+--------------------+ Portions of this table do not appear on this page. +----------+--------+-------+----------------+------------+           PSV cm/sEDV cmsDescribe        Arm Pressure +----------+--------+-------+----------------+------------+ Subclavian99             Multiphasic, WNL             +----------+--------+-------+----------------+------------+ +---------+--------+--+--------+--+---------+ VertebralPSV cm/s42EDV cm/s16Antegrade +---------+--------+--+--------+--+---------+ Left Carotid Findings: +----------+--------+--------+--------+------------+--------+           PSV cm/sEDV cm/sStenosisDescribe    Comments +----------+--------+--------+--------+------------+--------+ CCA Prox  92      35              heterogenous         +----------+--------+--------+--------+------------+--------+ CCA Distal76      29              heterogenous          +----------+--------+--------+--------+------------+--------+ ICA Prox  72      19      1-39%   calcific             +----------+--------+--------+--------+------------+--------+ ICA Mid   109     45              calcific             +----------+--------+--------+--------+------------+--------+ ICA Distal97      39                                   +----------+--------+--------+--------+------------+--------+ ECA       148     30                                   +----------+--------+--------+--------+------------+--------+ +----------+--------+--------+----------------+------------+ SubclavianPSV cm/sEDV cm/sDescribe        Arm Pressure +----------+--------+--------+----------------+------------+           127             Multiphasic, WNL             +----------+--------+--------+----------------+------------+ +---------+--------+--+--------+--+---------+ VertebralPSV cm/s66EDV cm/s20Antegrade +---------+--------+--+--------+--+---------+  ABI Findings: +---------+------------------+-----+----------+--------+ Right    Rt Pressure (mmHg)IndexWaveform  Comment  +---------+------------------+-----+----------+--------+ Brachial 132                    triphasic          +---------+------------------+-----+----------+--------+ PTA      102               0.76 monophasic         +---------+------------------+-----+----------+--------+ DP       118               0.87 monophasic         +---------+------------------+-----+----------+--------+  Great Toe91                0.67 Abnormal           +---------+------------------+-----+----------+--------+ +---------+------------------+-----+---------+-------+ Left     Lt Pressure (mmHg)IndexWaveform Comment +---------+------------------+-----+---------+-------+ Brachial 135                    triphasic        +---------+------------------+-----+---------+-------+ PTA      146                1.08 triphasic        +---------+------------------+-----+---------+-------+ DP       139               1.03 triphasic        +---------+------------------+-----+---------+-------+ Brion Aliment                0.64 Abnormal         +---------+------------------+-----+---------+-------+  Right Doppler Findings: +--------+--------+-----+---------+--------+ Site    PressureIndexDoppler  Comments +--------+--------+-----+---------+--------+ [id number]          triphasic         +--------+--------+-----+---------+--------+ Radial               triphasic         +--------+--------+-----+---------+--------+ Ulnar                triphasic         +--------+--------+-----+---------+--------+  Left Doppler Findings: +--------+--------+-----+---------+--------+ Site    PressureIndexDoppler  Comments +--------+--------+-----+---------+--------+ [id number]          triphasic         +--------+--------+-----+---------+--------+ Radial               triphasic         +--------+--------+-----+---------+--------+ Ulnar                triphasic         +--------+--------+-----+---------+--------+  Summary: 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 Non-hemodynamically significant plaque <50% noted in                the CCA. Left Carotid: Velocities in the left ICA are consistent with a 1-39% stenosis.               Non-hemodynamically significant plaque <50% noted in the CCA. Vertebrals:  Bilateral vertebral arteries demonstrate antegrade flow. Subclavians: Normal flow hemodynamics were seen in bilateral subclavian              arteries. 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 The right toe-brachial index is abnormal. Left ABI: Resting left ankle-brachial index is within normal range. No evidence of significant left lower extremity arterial disease. The left  toe-brachial index is abnormal. Right Upper Extremity: Doppler waveforms decrease >50% with right radial compression. Doppler waveforms remain within normal limits with right ulnar compression. Left Upper Extremity: Doppler waveforms remain within normal limits with left radial compression. Doppler waveforms remain within normal limits with left ulnar compression.  Electronically signed by Harold Barban MD on 05/31/2020 at 9:20:48 PM.    Final     Cardiac Studies    Cardiac Cath 05/30/2020: Ostial-distal LM 90%, mLAD 90%, pLCx 50%, OM1 70%. Ost-prox RCA 50% - mRCA 70%& dRCA70% --Severe LM-3V CAD. Normal LVEDP.  Echo pending on evaluation - bedside preliminary review - EF looks  normal with no RWMA.   Patient Profile     70 y.o. male prior smoker with HTN and HLD as well as prediabetes and family history of premature CAD who presented to [facility] on 05/29/2020 with recurrent chest pain-non-STEMI after initially leaving AMA from [place], [place] hospital on 05/28/2020 with elevated troponin levels.  After initial non-STEMI chest pain on the 22nd, he had recurrence of pain with minimal exertion leading to him coming to [facility].  With troponin elevations he was transferred to [facility] for cardiac catheterization on the afternoon of May 24 revealing multivessel disease. He had also been started on antibiotics for aspiration pneumonitis symptoms prior to transfer.  Assessment & Plan    Principal Problem:   Non-ST elevation (NSTEMI) myocardial infarction [facility]) Active Problems:   Coronary artery disease involving native coronary artery of native heart with unstable angina pectoris (HCC)   Left main coronary artery disease   Leukocytosis   Hypokalemia   Essential hypertension   Hyperlipidemia with target LDL less than 70   COPD (chronic obstructive pulmonary disease) (HCC)   Aspiration pneumonitis (HCC)  Principal Problem:   Non-ST elevation (NSTEMI) myocardial  infarction [facility])  Left main coronary artery disease -> CVTS consultation pending (discussing with PA, he was seen by Dr. Roxy Manns, and plan is for CABG on Friday) ->   work-up per CVTS Coronary artery disease involving native coronary artery of native heart with unstable angina pectoris ([place])  Echo pending, although appears that his EF is stable.    No further chest pain, on beta-blocker and ARB-titrate beta-blocker up to 25 mg twice daily  Aspirin and statin       Hypokalemia - repleted on admission; now stable at 3.8.    Essential hypertension -> borderline blood pressure control today on current meds.  Will increase to 25 mg twice daily Lopressor and continue irbesartan 75 mg daily.    Hyperlipidemia with target LDL less than 70  Rosuvastatin 40 mg daily    COPD (chronic obstructive pulmonary disease) (HCC)   Aspiration pneumonitis (HCC) / Leukocytosis  Continue coverage with Unasyn to avoid fever or further leukocytosis preoperatively.  PPI for GERD  Prediabetes: Check A1c and follow blood sugars.   For questions or updates, please contact [place] Please consult [email or web address] for contact info under        Signed, Glenetta Hew, MD  05/31/2020, 11:57 PM

## 2020-05-31 NOTE — Progress Notes (Signed)
  Echocardiogram 2D Echocardiogram has been performed.  James Wolf 05/31/2020, 10:39 AM

## 2020-05-31 NOTE — Progress Notes (Signed)
ANTICOAGULATION CONSULT NOTE - Follow Up Consult  Pharmacy Consult for IV Heparin Indication: chest pain/ACS  Allergies  Allergen Reactions  . Zithromax [Azithromycin] Diarrhea    Causes pt's stomach to "tear up" does not want to take medication again.     Patient Measurements: Weight: 75.8 kg (167 lb 1.7 oz)  Height: 65.5 inches Heparin Dosing Weight: 78.2 kg  Vital Signs: Temp: 99.1 F (37.3 C) (05/25 1100) Temp Source: Oral (05/25 1100) BP: 144/91 (05/25 1200) Pulse Rate: 79 (05/25 1200)  Labs: Recent Labs    05/29/20 1730 05/29/20 2329 05/30/20 0137 05/30/20 0422 05/31/20 0112 05/31/20 0827 05/31/20 0957  HGB 13.0  --   --  11.7* 12.0*  --   --   HCT 40.7  --   --  37.4* 36.4*  --   --   PLT 262  --   --  267 256  --   --   APTT  --   --   --  33  --   --   --   LABPROT  --   --   --  13.2  --   --   --   INR  --   --   --  1.0  --   --   --   HEPARINUNFRC  --   --   --   --   --   --  <0.10*  CREATININE 1.11  --   --  1.03  --   --   --   TROPONINIHS 16   < > 13  --   --  24* 19*   < > = values in this interval not displayed.    Estimated Creatinine Clearance: 65.9 mL/min (by C-G formula based on SCr of 1.03 mg/dL).   Medical History: Past Medical History:  Diagnosis Date  . Anemia   . Aspiration pneumonia ([place])   . Cancer ([place])    esophageal cancer 1995  . Chest pain   . COPD (chronic obstructive pulmonary disease) ([place])   . GERD (gastroesophageal reflux disease)   . History of kidney stones   . Hyperlipidemia   . Hypertension   . Joint pain   . Osteomyelitis ([place])   . Osteoporosis   . Rheumatic fever     Assessment: Pharmacy was consulted to dose heparin in 70 yr old man with chest pain/ACS.  Patient was not on anticoagulation prior to admission.  Pt rec'd heparin 4000 units IV bolus X 1, followed by heparin infusion at 900 units/hr at [facility] prior to transfer to [facility] for cardiac cath.  Pt is S/P cardiac cath,  which showed severe L main and 3-vessel obstructive CAD. CT surgery planning CABG 5/27  Pharmacy has been consulted to resume heparin infusion Heparin level < 0.1 on heparin drip 950 uts/hr. No bleeding, CBC stable   Goal of Therapy:  Heparin level 0.3-0.7 units/ml Monitor platelets by anticoagulation protocol: Yes   Plan:  Increase heparin drip 1050 uts/hr  Daily Heparin level, CBC     Bonnita Nasuti Pharm.D. CPP, BCPS Clinical Pharmacist [phone] 05/31/2020 2:48 PM

## 2020-05-31 NOTE — Consult Note (Addendum)
[address]       [place] [address]             [phone]          CARDIOTHORACIC SURGERY CONSULTATION REPORT  PCP is Sherrilee Gilles, DO Referring Provider is Peter Martinique, MD Primary Cardiologist Buford Dresser, MD (new)  Reason for consultation:  Left main disease  HPI:  Patient is a 70 year old male with no previous history of coronary artery disease but risk factors notable for history of hypertension, hyperlipidemia, and remote history of tobacco use who presented with a several day history of classical symptoms of unstable angina, had weakly positive troponin levels consistent with non-ST segment elevation myocardial infarction and has been referred for surgical consultation to discuss treatment options for management of 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  Patient has remote history of esophageal cancer for which she underwent Ivor Lewis esophagectomy in 1995 at [facility].  He did not require adjuvant chemotherapy or radiation therapy and he has done well ever since.  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  Patient was in his usual state of health until recently when he underwent surgical repair of left rotator cuff injury several months ago.  He has been going through rehab since then.  4 days ago patient was doing some exercises with a dumbbell and developed burning substernal chest pain radiating across his left chest to the left shoulder and to his neck.  He initially blamed it on his recent shoulder surgery and symptoms resolved with  rest.  The following day he was lifting something heavy and similar symptoms occurred and subsided with rest.  Later that night he had a prolonged episode of severe substernal chest pain that occurred at rest, prompting him to go to the emergency department in [place].  There by report Baseline EKG was unremarkable but serial troponin levels were positive.  The patient left the hospital AGAINST MEDICAL ADVICE and the following day went to [facility] for further evaluation.  The patient was transferred to [facility] where he underwent diagnostic cardiac catheterization by Dr. 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  Left ventricular function is preserved.  Cardiothoracic surgical consultation was requested.    The patient states that he has otherwise been doing well until recently.  He denies any previous history of severe burning substernal chest pain.  He does not experience exertional shortness of breath.  He has not had any shortness of breath over the last few days and he has not had any recurrent chest pain since Sunday.  He denies any history of PND, orthopnea, or lower extremity edema.  He has never had any palpitations, dizzy spells, nor syncope.  He does have chronic reflux and some difficulty swallowing and he states that recently he had problems with some aspiration and has had a persistent cough for the last few weeks.  He denies any associated fevers or chills and his cough is nonproductive.   Past Medical History:  Diagnosis Date  . Anemia   . Aspiration pneumonia ([place])   .  Cancer ([place])    esophageal cancer 1995  . Chest pain   . COPD (chronic obstructive pulmonary disease) ([place])   . GERD (gastroesophageal reflux disease)   . History of kidney stones   . Hyperlipidemia   . Hypertension   . Joint pain   . Osteomyelitis ([place])   . Osteoporosis   . Rheumatic fever     Past Surgical History:  Procedure Laterality Date  .  CYSTOSCOPY/RETROGRADE/URETEROSCOPY/STONE EXTRACTION WITH BASKET    . LEFT HEART CATH AND CORONARY ANGIOGRAPHY N/A 05/30/2020   Procedure: LEFT HEART CATH AND CORONARY ANGIOGRAPHY;  Surgeon: Martinique, Peter M, MD;  Location: [facility];  Service: Cardiovascular;  Laterality: N/A;  . partial esophageal ejectory    . SHOULDER OPEN ROTATOR CUFF REPAIR Left 10/26/2019   Procedure: ROTATOR CUFF REPAIR SHOULDER OPEN LEFT;  Surgeon: [name], MD;  Location: [facility];  Service: Orthopedics;  Laterality: Left;    Family History  Problem Relation Age of Onset  . CAD Father   . CVA Father     Social History   Socioeconomic History  . Marital status: Married    Spouse name: Not on file  . Number of children: Not on file  . Years of education: Not on file  . Highest education level: Not on file  Occupational History  . Not on file  Tobacco Use  . Smoking status: Former Smoker    Packs/day: 1.00    Years: 25.00    Pack years: 25.00    Types: Cigarettes    Quit date: 02/19/1993    Years since quitting: 27.2  . Smokeless tobacco: Never Used  Vaping Use  . Vaping Use: Never used  Substance and Sexual Activity  . Alcohol use: Not Currently    Comment: occ  . Drug use: Never  . Sexual activity: Yes  Other Topics Concern  . Not on file  Social History Narrative  . Not on file   Social Determinants of Health   Financial Resource Strain: Not on file  Food Insecurity: Not on file  Transportation Needs: Not on file  Physical Activity: Not on file  Stress: Not on file  Social Connections: Not on file  Intimate Partner Violence: Not on file    Prior to Admission medications   Medication Sig Start Date End Date Taking? Authorizing Provider  albuterol (VENTOLIN HFA) 108 (90 Base) MCG/ACT inhaler Inhale 1-2 puffs into the lungs every 6 (six) hours as needed for wheezing or shortness of breath.   Yes [provider]  B Complex Vitamins (VITAMIN B COMPLEX PO) Take 1  tablet by mouth daily.  03/10/07  Yes [provider]  cholecalciferol (VITAMIN D3) 25 MCG (1000 UNIT) tablet Take 1,000 Units by mouth daily.   Yes [provider]  diclofenac sodium (VOLTAREN) 1 % GEL Apply 2 g topically 2 (two) times daily as needed (pain).  09/24/15  Yes [provider]  esomeprazole (NEXIUM) 40 MG capsule Take 40 mg by mouth 2 (two) times daily.  05/26/17  Yes [provider]  ibuprofen (ADVIL) 800 MG tablet Take 1 tablet (800 mg total) by mouth every 8 (eight) hours as needed. 10/26/19  Yes [name], MD  telmisartan (MICARDIS) 20 MG tablet Take 20 mg by mouth daily.  06/30/17  Yes [provider]    Current Facility-Administered Medications  Medication Dose Route Frequency Provider Last Rate Last Admin  . 0.9 %  sodium chloride infusion  250  mL Intravenous PRN Ahmed Prima, [place] M, PA-C      . 0.9 %  sodium chloride infusion  250 mL Intravenous PRN Martinique, Peter M, MD      . acetaminophen (TYLENOL) tablet 650 mg  650 mg Oral Q4H PRN Martinique, Peter M, MD      . albuterol (VENTOLIN HFA) 108 (90 Base) MCG/ACT inhaler 1-2 puff  1-2 puff Inhalation Q6H PRN Martinique, Peter M, MD      . Ampicillin-Sulbactam (UNASYN) 3 g in sodium chloride 0.9 % 100 mL IVPB  3 g Intravenous Q6H Martinique, Peter M, MD   Stopped at 05/31/20 [id number]  . aspirin EC tablet 81 mg  81 mg Oral Daily Martinique, Peter M, MD   81 mg at 05/30/20 1029  . Chlorhexidine Gluconate Cloth 2 % PADS 6 each  6 each Topical Daily Buford Dresser, MD   6 each at 05/30/20 1845  . guaiFENesin-dextromethorphan (ROBITUSSIN DM) 100-10 MG/5ML syrup 5 mL  5 mL Oral Q4H PRN Martinique, Peter M, MD   5 mL at 05/31/20 0356  . heparin ADULT infusion 100 units/mL (25000 units/260mL)  950 Units/hr Intravenous Continuous Buford Dresser, MD 9.5 mL/hr at 05/31/20 0600 950 Units/hr at 05/31/20 0600  . irbesartan (AVAPRO) tablet 75 mg  75 mg Oral Daily Martinique, Peter M, MD   75 mg at 05/30/20  1029  . metoprolol tartrate (LOPRESSOR) tablet 12.5 mg  12.5 mg Oral BID Martinique, Peter M, MD   12.5 mg at 05/30/20 2140  . nitroGLYCERIN (NITROSTAT) SL tablet 0.4 mg  0.4 mg Sublingual Q5 min PRN Martinique, Peter M, MD      . ondansetron [facility]) injection 4 mg  4 mg Intravenous Q6H PRN Martinique, Peter M, MD      . pantoprazole (PROTONIX) EC tablet 40 mg  40 mg Oral Daily Martinique, Peter M, MD   40 mg at 05/30/20 1000  . rosuvastatin (CRESTOR) tablet 40 mg  40 mg Oral q1800 Martinique, Peter M, MD   40 mg at 05/30/20 1844  . sodium chloride flush (NS) 0.9 % injection 3 mL  3 mL Intravenous Q12H Erma Heritage, PA-C   3 mL at 05/30/20 2141  . sodium chloride flush (NS) 0.9 % injection 3 mL  3 mL Intravenous PRN Ahmed Prima, [place] M, PA-C      . sodium chloride flush (NS) 0.9 % injection 3 mL  3 mL Intravenous Q12H Martinique, Peter M, MD   3 mL at 05/31/20 0231  . sodium chloride flush (NS) 0.9 % injection 3 mL  3 mL Intravenous PRN Martinique, Peter M, MD        Allergies  Allergen Reactions  . Zithromax [Azithromycin] Diarrhea    Causes pt's stomach to "tear up" does not want to take medication again.       Review of Systems:   General:  normal appetite, normal energy, no weight gain, no weight loss, no fever  Cardiac:  + chest pain with exertion, + chest pain at rest, no SOB with exertion, no resting SOB, no PND, no orthopnea, no palpitations, n arrhythmia, no atrial fibrillation, no LE edema, no dizzy spells, no syncope  Respiratory:  no shortness of breath, no home oxygen, no productive cough, + dry cough, + bronchitis, no wheezing, no hemoptysis, no asthma, no pain with inspiration or cough, no sleep apnea, no CPAP at night  GI:   + difficulty swallowing, + reflux, no frequent heartburn, no hiatal hernia, no abdominal pain, no constipation,  no diarrhea, no hematochezia, no hematemesis, no melena  GU:   no dysuria,  no frequency, no urinary tract infection, no hematuria, no enlarged prostate, no  kidney stones, no kidney disease  Vascular:  no pain suggestive of claudication, no pain in feet, no leg cramps, no varicose veins, no DVT, no non-healing foot ulcer  Neuro:   no stroke, no TIA's, no seizures, no headaches, no temporary blindness one eye,  no slurred speech, no peripheral neuropathy, no chronic pain, no instability of gait, no memory/cognitive dysfunction  Musculoskeletal: + arthritis - primarily involving the lumbar spine, no joint swelling, no myalgias, no difficulty walking, normal mobility   Skin:   no rash, no itching, no skin infections, no pressure sores or ulcerations  Psych:   no anxiety, no depression, no nervousness, no unusual recent stress  Eyes:   no blurry vision, no floaters, no recent vision changes, no wears glasses or contacts  ENT:   no hearing loss, no loose or painful teeth, no dentures  Hematologic:  no easy bruising, no abnormal bleeding, no clotting disorder, no frequent epistaxis  Endocrine:  + "pre-diabetes", does not check CBG's at home     Physical Exam:   BP (!) 151/88   Pulse 76   Temp 98.8 F (37.1 C) (Oral)   Resp (!) 24   Wt 75.8 kg   SpO2 98%   BMI 27.39 kg/m   General:  Thin,  well-appearing  HEENT:  Unremarkable   Neck:   no JVD, no bruits, no adenopathy   Chest:   clear to auscultation, symmetrical breath sounds, no wheezes, no rhonchi   CV:   RRR, no  murmur   Abdomen:  soft, non-tender, no masses   Extremities:  warm, well-perfused, pulses palpable, no lower extremity edema  Rectal/GU  Deferred  Neuro:   Grossly non-focal and symmetrical throughout  Skin:   Clean and dry, no rashes, no breakdown  Diagnostic Tests:  Lab Results: Recent Labs    05/30/20 0422 05/31/20 0112  WBC 12.6* 11.0*  HGB 11.7* 12.0*  HCT 37.4* 36.4*  PLT 267 256   BMET:  Recent Labs    05/29/20 1730 05/30/20 0422  NA 138 139  K 3.3* 3.8  CL 104 105  CO2 27 26  GLUCOSE 128* 115*  BUN 11 11  CREATININE 1.11 1.03  CALCIUM 8.7* 8.6*     CBG (last 3)  No results for input(s): GLUCAP in the last 72 hours. PT/INR:   Recent Labs    05/30/20 0422  LABPROT 13.2  INR 1.0    CXR:  CHEST - 2 VIEW  COMPARISON:  Most recent comparison 11/29/2018, CT 07/20/2017  FINDINGS: Stable heart size and mediastinal contours. Gastric pull-through. Chronic bronchial thickening and hyperinflation. Patchy lung base opacity in the peripheries, left greater than right. No large pleural effusion. No pneumothorax. Chronic right apical pleuroparenchymal scarring. No acute osseous abnormalities are seen. Remote right rib fractures.  IMPRESSION: 1. Patchy lung base opacity in the peripheries, left greater than right, may be atelectasis or pneumonia. 2. Chronic bronchial thickening and hyperinflation. 3. Gastric pull-through.   Electronically Signed   By: Keith Rake M.D.   On: 05/29/2020 18:20    LEFT HEART CATH AND CORONARY ANGIOGRAPHY    Conclusion    Ost LM to Dist LM lesion is 90% stenosed.  Mid LAD lesion is 90% stenosed.  Prox Cx lesion is 50% stenosed.  1st Mrg lesion is 70% stenosed.  Ost RCA  to Prox RCA lesion is 50% stenosed.  Mid RCA lesion is 70% stenosed.  Dist RCA lesion is 70% stenosed.  LV end diastolic pressure is normal.   1. 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 Normal LVEDP  Plan: Echo to assess LV. Recommend CT surgery consultation for CABG.    Recommendations  Antiplatelet/Anticoag Recommend Aspirin 81mg  daily for moderate CAD.   Indications  Non-ST elevation (NSTEMI) myocardial infarction ([place]) [I21.4 (ICD-10-CM)]   Procedural Details  Technical Details Indication: 70 yo WM with NSTEMI  Procedural Details: The right wrist was prepped, draped, and anesthetized with 1% lidocaine. Using the modified Seldinger technique, a 6 French slender sheath was introduced into the right radial artery. 3 mg of verapamil was administered through the sheath, weight-based  unfractionated heparin was administered intravenously. Standard Judkins catheters were used for selective coronary angiography and left ventriculography. Catheter exchanges were performed over an exchange length guidewire. There were no immediate procedural complications. A TR band was used for radial hemostasis at the completion of the procedure.  The patient was transferred to the post catheterization recovery area for further monitoring. Contrast: 50 cc Estimated blood loss <50 mL.   During this procedure medications were administered to achieve and maintain moderate conscious sedation while the patient's heart rate, blood pressure, and oxygen saturation were continuously monitored and I was present face-to-face 100% of this time.   Medications (Filter: Administrations occurring from 1651 to 1753 on 05/30/20) (important) Continuous medications are totaled by the amount administered until 05/30/20 1753.    Heparin (Porcine) in NaCl 1000-0.9 UT/500ML-% SOLN (mL) Total volume:  1,000 mL  Date/Time Rate/Dose/Volume Action   05/30/20 1655 500 mL Given   1655 500 mL Given    midazolam (VERSED) injection (mg) Total dose:  2 mg  Date/Time Rate/Dose/Volume Action   05/30/20 1656 2 mg Given    fentaNYL (SUBLIMAZE) injection (mcg) Total dose:  25 mcg  Date/Time Rate/Dose/Volume Action   05/30/20 1656 25 mcg Given    Radial Cocktail/Verapamil only (mL) Total volume:  10 mL  Date/Time Rate/Dose/Volume Action   05/30/20 1713 10 mL Given    heparin sodium (porcine) injection (Units) Total dose:  4,000 Units  Date/Time Rate/Dose/Volume Action   05/30/20 1713 4,000 Units Given    iohexol (OMNIPAQUE) 350 MG/ML injection (mL) Total volume:  50 mL  Date/Time Rate/Dose/Volume Action   05/30/20 1727 50 mL Given    albuterol (VENTOLIN HFA) 108 (90 Base) MCG/ACT inhaler 1-2 puff (puff) Total dose:  Cannot be calculated*  *Administration dose not documented Date/Time Rate/Dose/Volume  Action   05/30/20 1657 *Not included in total MAR [place]   1700 *Not included in total MAR Hold    Ampicillin-Sulbactam (UNASYN) 3 g in sodium chloride 0.9 % 100 mL IVPB (mL/hr) Total dose:  Cannot be calculated*  *Administration dose not documented Date/Time Rate/Dose/Volume Action   05/30/20 1657 *Not included in total MAR [place]   1700 *Not included in total MAR Hold    aspirin EC tablet 81 mg (mg) Total dose:  Cannot be calculated*  *Administration dose not documented Date/Time Rate/Dose/Volume Action   05/30/20 1657 *Not included in total MAR [place]   1700 *Not included in total MAR Hold    guaiFENesin-dextromethorphan (ROBITUSSIN DM) 100-10 MG/5ML syrup 5 mL (mL) Total dose:  Cannot be calculated*  *Administration dose not documented Date/Time Rate/Dose/Volume Action   05/30/20 1657 *Not included in total MAR [place]   1700 *Not included in total MAR Hold  irbesartan (AVAPRO) tablet 75 mg (mg) Total dose:  Cannot be calculated*  *Administration dose not documented Date/Time Rate/Dose/Volume Action   05/30/20 1657 *Not included in total MAR [place]   1700 *Not included in total MAR Hold    metoprolol tartrate (LOPRESSOR) tablet 12.5 mg (mg) Total dose:  Cannot be calculated*  *Administration dose not documented Date/Time Rate/Dose/Volume Action   05/30/20 1657 *Not included in total MAR [place]   1700 *Not included in total MAR Hold    nitroGLYCERIN (NITROSTAT) SL tablet 0.4 mg (mg) Total dose:  Cannot be calculated*  *Administration dose not documented Date/Time Rate/Dose/Volume Action   05/30/20 1657 *Not included in total MAR [place]   1700 *Not included in total MAR Hold    pantoprazole (PROTONIX) EC tablet 40 mg (mg) Total dose:  Cannot be calculated*  *Administration dose not documented Date/Time Rate/Dose/Volume Action   05/30/20 1657 *Not included in total MAR [place]   1700 *Not included in total MAR Hold    rosuvastatin (CRESTOR) tablet 40 mg  (mg) Total dose:  Cannot be calculated*  *Administration dose not documented Date/Time Rate/Dose/Volume Action   05/30/20 1657 *Not included in total MAR [place]   1700 *Not included in total MAR Hold    Sedation Time  Sedation Time Physician-1: 27 minutes 6 seconds   Contrast  Medication Name Total Dose  iohexol (OMNIPAQUE) 350 MG/ML injection 50 mL    Radiation/Fluoro  Fluoro time: 3.6 (min) DAP: 14 (Gycm2) Cumulative Air Kerma: 412.8 (mGy)   Complications   Complications documented before study signed (05/30/2020 7:86 PM)    No complications were associated with this study.  Documented by Martinique, Peter M, MD - 05/30/2020 5:33 PM     Coronary Findings   Diagnostic Dominance: Right  Left Main  Vessel was injected.  Ost LM to Dist LM lesion is 90% stenosed. The lesion is severely calcified.  Left Anterior Descending  Mid LAD lesion is 90% stenosed.  Left Circumflex  Prox Cx lesion is 50% stenosed.  First Obtuse Marginal Branch  1st Mrg lesion is 70% stenosed.  Right Coronary Artery  Ost RCA to Prox RCA lesion is 50% stenosed.  Mid RCA lesion is 70% stenosed.  Dist RCA lesion is 70% stenosed.   Intervention   No interventions have been documented.  Left Heart  Left Ventricle LV end diastolic pressure is normal.   Coronary Diagrams   Diagnostic Dominance: Right         Impression:  Patient has left main and multivessel coronary artery disease.  He presents with classical symptoms of unstable angina and serial troponin levels were weakly positive, consistent with non-ST segment elevation myocardial infarction.  The patient has not had recurrent chest pain since admission.  I personally reviewed the patient's diagnostic cardiac catheterization.  He has critical long segment 90% stenosis of the left main coronary artery with severe three-vessel coronary artery disease.  There is somewhat diffuse distal disease as well although there appeared  to be adequate distal target vessels for grafting.  Left ventricular end-diastolic pressure was normal.  Left ventricular systolic function appears normal.  Transthoracic echocardiogram has been ordered but not yet performed.  I agree the patient would best be treated with surgical revascularization.    Plan:  I have reviewed the indications, risks, and potential benefits of coronary artery bypass grafting with the patient at the bedside this morning.  Alternative treatment strategies have been discussed, including the relative risks, benefits and long term prognosis associated with  medical therapy, percutaneous coronary intervention, and surgical revascularization.  The patient understands and accepts all potential associated risks of surgery including but not limited to risk of death, stroke or other neurologic complication, myocardial infarction, congestive heart failure, respiratory failure, renal failure, bleeding requiring blood transfusion and/or reexploration, aortic dissection or other major vascular complication, arrhythmia, heart block or bradycardia requiring permanent pacemaker, pneumonia, pleural effusion, wound infection, pulmonary embolus or other thromboembolic complication, chronic pain or other delayed complications related to median sternotomy, or the late recurrence of symptomatic ischemic heart disease and/or congestive heart failure.  The importance of long term risk modification have been emphasized.  All questions answered.  We plan to proceed with coronary artery bypass grafting on Friday, Jun 02, 2020.     I spent in excess of 60 minutes during the conduct of this hospital consultation and >50% of this time involved direct face-to-face encounter for counseling and/or coordination of the patient's care.    Valentina Gu. Roxy Manns, MD 05/31/2020 8:31 AM

## 2020-06-01 ENCOUNTER — Encounter (HOSPITAL_COMMUNITY): Payer: Self-pay | Admitting: Internal Medicine

## 2020-06-01 DIAGNOSIS — I214 Non-ST elevation (NSTEMI) myocardial infarction: Secondary | ICD-10-CM | POA: Diagnosis not present

## 2020-06-01 DIAGNOSIS — J690 Pneumonitis due to inhalation of food and vomit: Secondary | ICD-10-CM | POA: Diagnosis not present

## 2020-06-01 DIAGNOSIS — I25110 Atherosclerotic heart disease of native coronary artery with unstable angina pectoris: Secondary | ICD-10-CM | POA: Diagnosis not present

## 2020-06-01 DIAGNOSIS — I10 Essential (primary) hypertension: Secondary | ICD-10-CM | POA: Diagnosis not present

## 2020-06-01 LAB — POCT I-STAT 7, (LYTES, BLD GAS, ICA,H+H)
Acid-Base Excess: 2 mmol/L (ref 0.0–2.0)
Bicarbonate: 26.5 mmol/L (ref 20.0–28.0)
Calcium, Ion: 1.24 mmol/L (ref 1.15–1.40)
HCT: 34 % — ABNORMAL LOW (ref 39.0–52.0)
Hemoglobin: 11.6 g/dL — ABNORMAL LOW (ref 13.0–17.0)
O2 Saturation: 91 %
Patient temperature: 97.8
Potassium: 3.7 mmol/L (ref 3.5–5.1)
Sodium: 138 mmol/L (ref 135–145)
TCO2: 28 mmol/L (ref 22–32)
pCO2 arterial: 38.8 mmHg (ref 32.0–48.0)
pH, Arterial: 7.44 (ref 7.350–7.450)
pO2, Arterial: 57 mmHg — ABNORMAL LOW (ref 83.0–108.0)

## 2020-06-01 LAB — URINALYSIS, ROUTINE W REFLEX MICROSCOPIC
Bilirubin Urine: NEGATIVE
Glucose, UA: NEGATIVE mg/dL
Hgb urine dipstick: NEGATIVE
Ketones, ur: 5 mg/dL — AB
Leukocytes,Ua: NEGATIVE
Nitrite: NEGATIVE
Protein, ur: NEGATIVE mg/dL
Specific Gravity, Urine: 1.011 (ref 1.005–1.030)
pH: 6 (ref 5.0–8.0)

## 2020-06-01 LAB — COMPREHENSIVE METABOLIC PANEL
ALT: 16 U/L (ref 0–44)
AST: 18 U/L (ref 15–41)
Albumin: 2.9 g/dL — ABNORMAL LOW (ref 3.5–5.0)
Alkaline Phosphatase: 48 U/L (ref 38–126)
Anion gap: 8 (ref 5–15)
BUN: 11 mg/dL (ref 8–23)
CO2: 25 mmol/L (ref 22–32)
Calcium: 8.8 mg/dL — ABNORMAL LOW (ref 8.9–10.3)
Chloride: 104 mmol/L (ref 98–111)
Creatinine, Ser: 1.17 mg/dL (ref 0.61–1.24)
GFR, Estimated: >60 mL/min (ref >60)
Glucose, Bld: 119 mg/dL — ABNORMAL HIGH (ref 70–99)
Potassium: 3.7 mmol/L (ref 3.5–5.1)
Sodium: 137 mmol/L (ref 135–145)
Total Bilirubin: 0.7 mg/dL (ref 0.3–1.2)
Total Protein: 6.9 g/dL (ref 6.5–8.1)

## 2020-06-01 LAB — BASIC METABOLIC PANEL
Anion gap: 9 (ref 5–15)
BUN: 11 mg/dL (ref 8–23)
CO2: 25 mmol/L (ref 22–32)
Calcium: 8.9 mg/dL (ref 8.9–10.3)
Chloride: 101 mmol/L (ref 98–111)
Creatinine, Ser: 1.36 mg/dL — ABNORMAL HIGH (ref 0.61–1.24)
GFR, Estimated: 57 mL/min — ABNORMAL LOW (ref >60)
Glucose, Bld: 105 mg/dL — ABNORMAL HIGH (ref 70–99)
Potassium: 4.1 mmol/L (ref 3.5–5.1)
Sodium: 135 mmol/L (ref 135–145)

## 2020-06-01 LAB — PROTIME-INR
INR: 1 (ref 0.8–1.2)
Prothrombin Time: 12.9 seconds (ref 11.4–15.2)

## 2020-06-01 LAB — CBC
HCT: 37 % — ABNORMAL LOW (ref 39.0–52.0)
Hemoglobin: 12.2 g/dL — ABNORMAL LOW (ref 13.0–17.0)
MCH: 29.5 pg (ref 26.0–34.0)
MCHC: 33 g/dL (ref 30.0–36.0)
MCV: 89.6 fL (ref 80.0–100.0)
Platelets: 275 10*3/uL (ref 150–400)
RBC: 4.13 MIL/uL — ABNORMAL LOW (ref 4.22–5.81)
RDW: 13.7 % (ref 11.5–15.5)
WBC: 10 10*3/uL (ref 4.0–10.5)
nRBC: 0 % (ref 0.0–0.2)

## 2020-06-01 LAB — HEPARIN LEVEL (UNFRACTIONATED)
Heparin Unfractionated: 0.14 IU/mL — ABNORMAL LOW (ref 0.30–0.70)
Heparin Unfractionated: 0.22 IU/mL — ABNORMAL LOW (ref 0.30–0.70)
Heparin Unfractionated: 0.36 IU/mL (ref 0.30–0.70)

## 2020-06-01 LAB — SURGICAL PCR SCREEN
MRSA, PCR: NEGATIVE
Staphylococcus aureus: NEGATIVE

## 2020-06-01 LAB — APTT: aPTT: 72 seconds — ABNORMAL HIGH (ref 24–36)

## 2020-06-01 LAB — ABO/RH: ABO/RH(D): B POS

## 2020-06-01 MED ORDER — NITROGLYCERIN IN D5W 200-5 MCG/ML-% IV SOLN
2.0000 ug/min | INTRAVENOUS | Status: AC
  Administered 2020-06-02: 5 ug/min via INTRAVENOUS
  Filled 2020-06-01: qty 250

## 2020-06-01 MED ORDER — VANCOMYCIN HCL 1000 MG IV SOLR
INTRAVENOUS | Status: AC
  Administered 2020-06-02: 1250 mL
  Filled 2020-06-01: qty 1000

## 2020-06-01 MED ORDER — CHLORHEXIDINE GLUCONATE 0.12 % MT SOLN
15.0000 mL | Freq: Once | OROMUCOSAL | Status: AC
  Administered 2020-06-02: 15 mL via OROMUCOSAL
  Filled 2020-06-01: qty 15

## 2020-06-01 MED ORDER — CHLORHEXIDINE GLUCONATE 4 % EX LIQD
60.0000 mL | Freq: Once | CUTANEOUS | Status: AC
  Administered 2020-06-02: 4 via TOPICAL
  Filled 2020-06-01: qty 60

## 2020-06-01 MED ORDER — SODIUM CHLORIDE 0.9 % IV SOLN
INTRAVENOUS | Status: DC
  Filled 2020-06-01: qty 30

## 2020-06-01 MED ORDER — EPINEPHRINE HCL 5 MG/250ML IV SOLN IN NS
0.0000 ug/min | INTRAVENOUS | Status: DC
  Filled 2020-06-01: qty 250

## 2020-06-01 MED ORDER — PLASMA-LYTE 148 IV SOLN
INTRAVENOUS | Status: DC
  Filled 2020-06-01: qty 2.5

## 2020-06-01 MED ORDER — CEFAZOLIN SODIUM-DEXTROSE 2-4 GM/100ML-% IV SOLN
2.0000 g | INTRAVENOUS | Status: DC
  Filled 2020-06-01: qty 100

## 2020-06-01 MED ORDER — MAGNESIUM SULFATE 50 % IJ SOLN
40.0000 meq | INTRAMUSCULAR | Status: DC
  Filled 2020-06-01: qty 9.85

## 2020-06-01 MED ORDER — TEMAZEPAM 15 MG PO CAPS: 15.0000 mg | ORAL_CAPSULE | Freq: Once | ORAL | Status: DC | PRN

## 2020-06-01 MED ORDER — PHENYLEPHRINE HCL-NACL 20-0.9 MG/250ML-% IV SOLN
30.0000 ug/min | INTRAVENOUS | Status: AC
  Administered 2020-06-02: 25 ug/min via INTRAVENOUS
  Filled 2020-06-01: qty 250

## 2020-06-01 MED ORDER — POTASSIUM CHLORIDE 2 MEQ/ML IV SOLN
80.0000 meq | INTRAVENOUS | Status: DC
  Filled 2020-06-01: qty 40

## 2020-06-01 MED ORDER — CHLORHEXIDINE GLUCONATE 4 % EX LIQD
60.0000 mL | Freq: Once | CUTANEOUS | Status: AC
  Administered 2020-06-01: 4 via TOPICAL
  Filled 2020-06-01: qty 15

## 2020-06-01 MED ORDER — VANCOMYCIN HCL 1250 MG/250ML IV SOLN
1250.0000 mg | INTRAVENOUS | Status: DC
  Filled 2020-06-01: qty 250

## 2020-06-01 MED ORDER — TRANEXAMIC ACID (OHS) BOLUS VIA INFUSION
15.0000 mg/kg | INTRAVENOUS | Status: AC
  Administered 2020-06-02: 1137 mg via INTRAVENOUS
  Filled 2020-06-01: qty 1137

## 2020-06-01 MED ORDER — DEXMEDETOMIDINE HCL IN NACL 400 MCG/100ML IV SOLN
0.1000 ug/kg/h | INTRAVENOUS | Status: AC
  Administered 2020-06-02: .3 ug/kg/h via INTRAVENOUS
  Filled 2020-06-01: qty 100

## 2020-06-01 MED ORDER — BISACODYL 5 MG PO TBEC
5.0000 mg | DELAYED_RELEASE_TABLET | Freq: Once | ORAL | Status: DC
  Filled 2020-06-01: qty 1

## 2020-06-01 MED ORDER — INSULIN REGULAR(HUMAN) IN NACL 100-0.9 UT/100ML-% IV SOLN
INTRAVENOUS | Status: DC
  Filled 2020-06-01: qty 100

## 2020-06-01 MED ORDER — MILRINONE LACTATE IN DEXTROSE 20-5 MG/100ML-% IV SOLN
0.3000 ug/kg/min | INTRAVENOUS | Status: DC
  Filled 2020-06-01: qty 100

## 2020-06-01 MED ORDER — TRANEXAMIC ACID (OHS) PUMP PRIME SOLUTION
2.0000 mg/kg | INTRAVENOUS | Status: DC
  Filled 2020-06-01: qty 1.52

## 2020-06-01 MED ORDER — METOPROLOL TARTRATE 12.5 MG HALF TABLET
12.5000 mg | ORAL_TABLET | Freq: Once | ORAL | Status: AC
  Administered 2020-06-02: 12.5 mg via ORAL
  Filled 2020-06-01: qty 1

## 2020-06-01 MED ORDER — TRANEXAMIC ACID 1000 MG/10ML IV SOLN
1.5000 mg/kg/h | INTRAVENOUS | Status: AC
  Administered 2020-06-02: 1.5 mg/kg/h via INTRAVENOUS
  Filled 2020-06-01: qty 25

## 2020-06-01 MED ORDER — CEFAZOLIN SODIUM-DEXTROSE 2-4 GM/100ML-% IV SOLN
2.0000 g | INTRAVENOUS | Status: AC
  Administered 2020-06-02 (×2): 2 g via INTRAVENOUS
  Filled 2020-06-01: qty 100

## 2020-06-01 MED ORDER — MUPIROCIN 2 % EX OINT: 1.0000 "application " | TOPICAL_OINTMENT | Freq: Two times a day (BID) | CUTANEOUS | Status: DC

## 2020-06-01 MED ORDER — NOREPINEPHRINE 4 MG/250ML-% IV SOLN
0.0000 ug/min | INTRAVENOUS | Status: DC
  Filled 2020-06-01: qty 250

## 2020-06-01 NOTE — Progress Notes (Signed)
[address]       [place] [address]             [phone]        CARDIOTHORACIC SURGERY PROGRESS NOTE   R2 Days Post-Op Procedure(s) (LRB): LEFT HEART CATH AND CORONARY ANGIOGRAPHY (N/A)  Subjective: No complaints.  No chest pain, SOB  Objective: Vital signs: BP Readings from Last 1 Encounters:  06/01/20 (!) 142/82   Pulse Readings from Last 1 Encounters:  06/01/20 66   Resp Readings from Last 1 Encounters:  06/01/20 18   Temp Readings from Last 1 Encounters:  06/01/20 97.7 F (36.5 C) (Oral)    Hemodynamics:    Physical Exam:  Rhythm:   sinus  Breath sounds: clear  Heart sounds:  RRR  Incisions:  n/a  Abdomen:  soft  Extremities:  warm   Intake/Output from previous day: 05/25 0701 - 05/26 0700 In: 926.5 [P.O.:250; I.V.:266.5; IV Piggyback:410] Out: 2300 [Urine:2300] Intake/Output this shift: No intake/output data recorded.  Lab Results:  CBC: Recent Labs    05/31/20 0112 06/01/20 0034  WBC 11.0* 10.0  HGB 12.0* 12.2*  HCT 36.4* 37.0*  PLT 256 275    BMET:  Recent Labs    05/30/20 0422 06/01/20 0034  NA 139 135  K 3.8 4.1  CL 105 101  CO2 26 25  GLUCOSE 115* 105*  BUN 11 11  CREATININE 1.03 1.36*  CALCIUM 8.6* 8.9     PT/INR:   Recent Labs    05/30/20 0422  LABPROT 13.2  INR 1.0    CBG (last 3)  No results for input(s): GLUCAP in the last 72 hours.  ABG    Component Value Date/Time   HCO3 24.0 07/19/2017 1759   O2SAT 32.5 07/19/2017 1759    CXR: N/A    ECHOCARDIOGRAM REPORT       Patient Name:  James Wolf Date of Exam: 05/31/2020  Medical Rec #: [id number]    Height:    65.5 in  Accession #:  [id number]    Weight:    167.1 lb  Date of Birth: [birth year]    BSA:     1.843 m  Patient Age:  70 years     BP:      138/115 mmHg  Patient Gender: M        HR:      77 bpm.  Exam Location: Inpatient   Procedure: 2D Echo    Indications:  chest pain    History:    Patient has no prior history of Echocardiogram  examinations.         CAD, COPD; Risk Factors:Hypertension.    Sonographer:  Johny Chess  Referring Phys: [id number] PETER M [place]   IMPRESSIONS    1. Left ventricular ejection fraction, by estimation, is 60 to 65%. The  left ventricle has normal function. The left ventricle has no regional  wall motion abnormalities. Left ventricular diastolic parameters were  normal.  2. Right ventricular systolic function is normal. The right ventricular  size is normal. There is normal pulmonary artery systolic pressure.  3. The mitral valve is normal in structure. No evidence of mitral valve  regurgitation. No evidence of mitral stenosis.  4. The aortic valve is tricuspid. Aortic valve regurgitation is not  visualized. Mild aortic valve sclerosis is present, with no evidence of  aortic valve stenosis.  5. The inferior vena cava is normal in size with greater than 50%  respiratory variability, suggesting right atrial pressure of 3 mmHg.   FINDINGS  Left Ventricle: Left ventricular ejection fraction, by estimation, is 60  to 65%. The left ventricle has normal function. The left ventricle has no  regional wall motion abnormalities. The left ventricular internal cavity  size was normal in size. There is  no left ventricular hypertrophy. Left ventricular diastolic parameters  were normal. Normal left ventricular filling pressure.   Right Ventricle: The right ventricular size is normal. No increase in  right ventricular wall thickness. Right ventricular systolic function is  normal. There is normal pulmonary artery systolic pressure. The tricuspid  regurgitant velocity is 2.48 m/s, and  with an assumed right atrial pressure of 3 mmHg, the estimated right  ventricular systolic pressure is 25.9 mmHg.   Left Atrium: Left atrial size was normal in size.   Right Atrium: Right  atrial size was normal in size.   Pericardium: There is no evidence of pericardial effusion.   Mitral Valve: The mitral valve is normal in structure. No evidence of  mitral valve regurgitation. No evidence of mitral valve stenosis.   Tricuspid Valve: The tricuspid valve is normal in structure. Tricuspid  valve regurgitation is mild . No evidence of tricuspid stenosis.   Aortic Valve: The aortic valve is tricuspid. Aortic valve regurgitation is  not visualized. Mild aortic valve sclerosis is present, with no evidence  of aortic valve stenosis.   Pulmonic Valve: The pulmonic valve was normal in structure. Pulmonic valve  regurgitation is not visualized. No evidence of pulmonic stenosis.   Aorta: The aortic root is normal in size and structure.   Venous: The inferior vena cava is normal in size with greater than 50%  respiratory variability, suggesting right atrial pressure of 3 mmHg.   IAS/Shunts: No atrial level shunt detected by color flow Doppler.     LEFT VENTRICLE  PLAX 2D  LVIDd:     4.70 cm   Diastology  LVIDs:     3.40 cm   LV e' medial:  8.16 cm/s  LV PW:     0.80 cm   LV E/e' medial: 9.8  LV IVS:    0.80 cm   LV e' lateral:  9.90 cm/s  LVOT diam:   1.90 cm   LV E/e' lateral: 8.1  LV SV:     45  LV SV Index:  24  LVOT Area:   2.84 cm    LV Volumes (MOD)  LV vol d, MOD A4C: 72.8 ml  LV vol s, MOD A4C: 37.0 ml  LV SV MOD A4C:   72.8 ml   RIGHT VENTRICLE       IVC  RV S prime:   16.10 cm/s IVC diam: 1.40 cm  TAPSE (M-mode): 2.3 cm   LEFT ATRIUM       Index    RIGHT ATRIUM      Index  LA diam:    2.50 cm 1.36 cm/m RA Area:   13.50 cm  LA Vol (A2C):  40.3 ml 21.86 ml/m RA Volume:  32.10 ml 17.42 ml/m  LA Vol (A4C):  46.0 ml 24.96 ml/m  LA Biplane Vol: 45.1 ml 24.47 ml/m  AORTIC VALVE  LVOT Vmax:  82.20 cm/s  LVOT Vmean: 54.400 cm/s  LVOT VTI:  0.159 m    AORTA  Ao Root  diam: 3.30 cm  Ao Asc diam: 3.00 cm   MITRAL VALVE        TRICUSPID VALVE  MV Area (PHT):  3.85 cm  TR Peak grad:  24.6 mmHg  MV Decel Time: 197 msec  TR Vmax:    248.00 cm/s  MV E velocity: 79.70 cm/s  MV A velocity: 79.30 cm/s SHUNTS  MV E/A ratio: 1.01    Systemic VTI: 0.16 m               Systemic Diam: 1.90 cm   Dani Gobble Croitoru MD  Electronically signed by Sanda Klein MD  Signature Date/Time: 05/31/2020/1:59:53 PM     Assessment/Plan: S/P Procedure(s) (LRB): LEFT HEART CATH AND CORONARY ANGIOGRAPHY (N/A)  For CABG tomorrow.  All questions answered.    Rexene Alberts, MD 06/01/2020 8:41 AM

## 2020-06-01 NOTE — Progress Notes (Signed)
Patient states he self dilates his esophagus 2-3 times per week. States he's been doing this for 20+ years. Patient stated he hasn't done this since Monday and would like to do it today prior to surgery tomorrow. Dr. Ellyn Hack was notified who stated he didn't feel comfortable with this being done today as patient is currently on a heparin drip. Patient informed. Will mention it to Dr. Roxy Manns when he rounds later today.  Joellen Jersey, RN

## 2020-06-01 NOTE — Progress Notes (Signed)
ANTICOAGULATION CONSULT NOTE - Follow Up Consult  Pharmacy Consult for Heparin Indication: 3VCAD  Allergies  Allergen Reactions  . Zithromax [Azithromycin] Diarrhea    Causes pt's stomach to "tear up" does not want to take medication again.     Patient Measurements: Weight: 75.8 kg (167 lb 1.7 oz) Heparin Dosing Weight:    Vital Signs: Temp: 98.2 F (36.8 C) (05/26 1615) Temp Source: Oral (05/26 1615) BP: 149/83 (05/26 1700) Pulse Rate: 66 (05/26 1700)  Labs: Recent Labs    05/30/20 0137 05/30/20 0422 05/30/20 0422 05/31/20 0112 05/31/20 0827 05/31/20 0957 05/31/20 0957 06/01/20 0034 06/01/20 0809 06/01/20 1152 06/01/20 1222 06/01/20 1636  HGB  --  11.7*   < > 12.0*  --   --   --  12.2*  --  11.6*  --   --   HCT  --  37.4*   < > 36.4*  --   --   --  37.0*  --  34.0*  --   --   PLT  --  267  --  256  --   --   --  275  --   --   --   --   APTT  --  33  --   --   --   --   --   --   --   --  72*  --   LABPROT  --  13.2  --   --   --   --   --   --   --   --  12.9  --   INR  --  1.0  --   --   --   --   --   --   --   --  1.0  --   HEPARINUNFRC  --   --   --   --   --  <0.10*   < > 0.14* 0.22*  --   --  0.36  CREATININE  --  1.03  --   --   --   --   --  1.36*  --   --  1.17  --   TROPONINIHS 13  --   --   --  24* 19*  --   --   --   --   --   --    < > = values in this interval not displayed.    Estimated Creatinine Clearance: 58 mL/min (by C-G formula based on SCr of 1.17 mg/dL).   Assessment: Anticoag: 3VCAD. Hep level 0.36 in goal. Hgb 11.6.   Goal of Therapy:  Heparin level 0.3-0.7 units/ml Monitor platelets by anticoagulation protocol: Yes   Plan:  Heparin 1450 units/hr Plan CABG 5/27 - heparin will be turned off 5/27 ~0300   Maico Mulvehill S. Alford Highland, PharmD, BCPS Clinical Staff Pharmacist [email or web address] Alford Highland, Dracen Reigle Stillinger 06/01/2020,5:43 PM

## 2020-06-01 NOTE — Progress Notes (Signed)
CARDIAC REHAB PHASE I   Follow-up completed with pt and wife. Pt asking about visitation policy during surgery, RN to f/u. Pt states CP this morning after getting tangled in sheets, resolved on its own after 50m. Pt denies further questions or concerns at this time. Will continue to follow pt throughout his stay.  [phone] Rufina Falco, RN BSN 06/01/2020 11:15 AM

## 2020-06-01 NOTE — Anesthesia Preprocedure Evaluation (Addendum)
Anesthesia Evaluation  Patient identified by MRN, date of birth, ID band Patient awake    Reviewed: Allergy & Precautions, NPO status , Patient's Chart, lab work & pertinent test results  History of Anesthesia Complications Negative for: history of anesthetic complications  Airway Mallampati: II  TM Distance: >3 FB Neck ROM: Full    Dental  (+) Dental Advisory Given   Pulmonary COPD, former smoker,  05/29/2020 SARS coronavirus NEG   breath sounds clear to auscultation       Cardiovascular hypertension, Pt. on medications + angina + CAD (severe LM and 3x ASCAD) and + Past MI   Rhythm:Regular Rate:Normal  05/31/2020 ECHO: EF 60-65%, no significant valvular disease   Neuro/Psych negative neurological ROS     GI/Hepatic Neg liver ROS, GERD  Medicated,1995 esophageal cancer   Endo/Other  negative endocrine ROS  Renal/GU negative Renal ROS     Musculoskeletal   Abdominal   Peds  Hematology negative hematology ROS (+)   Anesthesia Other Findings   Reproductive/Obstetrics                            Anesthesia Physical Anesthesia Plan  ASA: IV  Anesthesia Plan: General   Post-op Pain Management:    Induction: Intravenous  PONV Risk Score and Plan: 2 and Treatment may vary due to age or medical condition  Airway Management Planned: Oral ETT  Additional Equipment: Arterial line, PA Cath and Ultrasound Guidance Line Placement  Intra-op Plan:   Post-operative Plan: Post-operative intubation/ventilation  Informed Consent: I have reviewed the patients History and Physical, chart, labs and discussed the procedure including the risks, benefits and alternatives for the proposed anesthesia with the patient or authorized representative who has indicated his/her understanding and acceptance.     Dental advisory given  Plan Discussed with:   Anesthesia Plan Comments:        Anesthesia  Quick Evaluation

## 2020-06-01 NOTE — Progress Notes (Signed)
Progress Note  Patient Name: [name] Date of Encounter: 06/01/2020  [facility] Cardiologist: None Mrsic, Orpah Cobb, MD    Patient Profile     70 y.o. male prior smoker with HTN and HLD as well as prediabetes and family history of premature CAD who presented to [facility] on 05/29/2020 with recurrent chest pain-non-STEMI after [facility] with elevated troponin levels.  After initial non-STEMI chest pain on the 22nd, he had recurrence of pain with minimal exertion leading to him coming to [facility]  With troponin elevations he was transferred to [facility] for cardiac catheterization on the afternoon of May 24 revealing multivessel disease. He had also been started on antibiotics for aspiration pneumonitis symptoms prior to transfer  Subjective   Doing well.  No further chest pain.  Happy to have date and time of upcoming surgery.  Happy to hear results of his echocardiogram. No CHF Symptoms, no palpitations.  Inpatient Medications    Scheduled Meds: . aspirin EC  81 mg Oral Daily  . bisacodyl  5 mg Oral Once  . chlorhexidine  60 mL Topical Once   And  . [START ON 06/02/2020] chlorhexidine  60 mL Topical Once  . [START ON 06/02/2020] chlorhexidine  15 mL Mouth/Throat Once  . Chlorhexidine Gluconate Cloth  6 each Topical Daily  . [START ON 06/02/2020] epinephrine  0-10 mcg/min Intravenous To OR  . [START ON 06/02/2020] heparin-papaverine-plasmalyte irrigation   Irrigation To OR  . [START ON 06/02/2020] insulin   Intravenous To OR  . irbesartan  75 mg Oral Daily  . [START ON 06/02/2020] magnesium sulfate  40 mEq Other To OR  . [START ON 06/02/2020] metoprolol tartrate  12.5 mg Oral Once  . metoprolol tartrate  25 mg Oral BID  . pantoprazole  40 mg Oral Daily  . [START ON 06/02/2020] phenylephrine  30-200 mcg/min Intravenous To OR  . [START ON 06/02/2020] potassium chloride  80 mEq Other To OR  .  rosuvastatin  40 mg Oral q1800  . sodium chloride flush  3 mL Intravenous Q12H  . sodium chloride flush  3 mL Intravenous Q12H  . [START ON 06/02/2020] tranexamic acid  15 mg/kg Intravenous To OR  . [START ON 06/02/2020] tranexamic acid  2 mg/kg Intracatheter To OR  . [START ON 06/02/2020] vancomycin 1000 mg in NS (1000 ml) irrigation for Dr. Roxy Manns case   Irrigation To OR   Continuous Infusions: . sodium chloride    . sodium chloride 10 mL/hr at 06/01/20 1800  . ampicillin-sulbactam (UNASYN) IV Stopped (06/01/20 1524)  . [START ON 06/02/2020]  ceFAZolin (ANCEF) IV    . [START ON 06/02/2020]  ceFAZolin (ANCEF) IV    . [START ON 06/02/2020] dexmedetomidine    . [START ON 06/02/2020] heparin 30,000 units/NS 1000 mL solution for CELLSAVER    . heparin 1,450 Units/hr (06/01/20 1800)  . [START ON 06/02/2020] milrinone    . [START ON 06/02/2020] nitroGLYCERIN    . [START ON 06/02/2020] norepinephrine    . [START ON 06/02/2020] tranexamic acid (CYKLOKAPRON) infusion ([facility])    . [START ON 06/02/2020] vancomycin     PRN Meds: sodium chloride, sodium chloride, acetaminophen, albuterol, guaiFENesin-dextromethorphan, nitroGLYCERIN, ondansetron (ZOFRAN) IV, sodium chloride flush, sodium chloride flush, temazepam   Vital Signs    Vitals:   06/01/20 1600 06/01/20 1615 06/01/20 1700 06/01/20 1800  BP: 137/80  (!) 149/83 (!) 145/81  Pulse: 65  66  68  Resp: (!) 26  19 (!) 28  Temp:  98.2 F (36.8 C)    TempSrc:  Oral    SpO2: 91%  95% 93%  Weight:        Intake/Output Summary (Last 24 hours) at 06/01/2020 1846 Last data filed at 06/01/2020 1800 Gross per 24 hour  Intake 1054.99 ml  Output 2530 ml  Net -1475.01 ml   Last 3 Weights 05/31/2020 05/30/2020 05/30/2020  Weight (lbs) 167 lb 1.7 oz 172 lb 4.6 oz 172 lb 4.8 oz  Weight (kg) 75.8 kg 78.15 kg 78.155 kg      Telemetry    Remains in sinus rhythm-60-90 bpm.- Personally Reviewed  ECG    No EKG done today.- Personally Reviewed  Physical Exam    GEN:  Resting comfortably in bed.  NAD.  Jovial attitude.    Neck:  No carotid bruit JVD. Cardiac:  RRR, normal S1-S2.  No M/R/G.    Respiratory:  CTA B, nonlabored, good air movement. GI: Soft/NT/ND STEMI BS.  No HSM MS:  No C/C/E.  No deformity. Neuro:   Nonfocal.  Gross intact. Psych: Normal affect.  Labs    High Sensitivity Troponin:   Recent Labs  Lab 05/29/20 1730 05/29/20 2329 05/30/20 0137 05/31/20 0827 05/31/20 0957  TROPONINIHS 16 14 13  24* 19*      Chemistry Recent Labs  Lab 05/30/20 0422 06/01/20 0034 06/01/20 1152 06/01/20 1222  NA 139 135 138 137  K 3.8 4.1 3.7 3.7  CL 105 101  --  104  CO2 26 25  --  25  GLUCOSE 115* 105*  --  119*  BUN 11 11  --  11  CREATININE 1.03 1.36*  --  1.17  CALCIUM 8.6* 8.9  --  8.8*  PROT 6.6  --   --  6.9  ALBUMIN 3.2*  --   --  2.9*  AST 16  --   --  18  ALT 17  --   --  16  ALKPHOS 48  --   --  48  BILITOT 0.7  --   --  0.7  GFRNONAA >60 57*  --  >60  ANIONGAP 8 9  --  8     Hematology Recent Labs  Lab 05/30/20 0422 05/31/20 0112 06/01/20 0034 06/01/20 1152  WBC 12.6* 11.0* 10.0  --   RBC 4.02* 4.05* 4.13*  --   HGB 11.7* 12.0* 12.2* 11.6*  HCT 37.4* 36.4* 37.0* 34.0*  MCV 93.0 89.9 89.6  --   MCH 29.1 29.6 29.5  --   MCHC 31.3 33.0 33.0  --   RDW 14.1 13.9 13.7  --   PLT 267 256 275  --     BNPNo results for input(s): BNP, PROBNP in the last 168 hours.   DDimer No results for input(s): DDIMER in the last 168 hours.   Radiology     Cardiac Studies    Cardiac Cath 05/30/2020: Ostial-distal LM 90%, mLAD 90%, pLCx 50%, OM1 70%. Ost-prox RCA 50% - mRCA 70%& dRCA70% --Severe LM-3V CAD. Normal LVEDP.  Echo 05/31/2020: EF 6065%.  No R WMA.  Normal diastolic function.  Mild aortic valve sclerosis with no stenosis.  No mitral valve.  Normal RV. Marland Kitchen  Assessment & Plan    Principal Problem:   Non-ST elevation (NSTEMI) myocardial infarction [facility]) Active Problems:   Coronary artery disease involving  native coronary artery of native heart with unstable angina pectoris ([place])   Left main  coronary artery disease   Leukocytosis   Hypokalemia   Essential hypertension   Hyperlipidemia with target LDL less than 70   COPD (chronic obstructive pulmonary disease) (HCC)   Aspiration pneumonitis (HCC)  Principal Problem:   Non-ST elevation (NSTEMI) myocardial infarction [facility])  Left main coronary artery disease -> CVTS consultation pending (discussing with PA, he was seen by Dr. Roxy Manns, and plan is for CABG on Friday) ->   Continues CABG work-up per CVTS. Coronary artery disease involving native coronary artery of native heart with unstable angina pectoris ([place])  Echo with normal EF and no RWM A.  No signs of heart failure  On combination of beta-blocker and ARB-no further angina.  Blood pressure stable, will discontinue current dose and titrate further.  He tolerated the increased dose of beta-blocker.  Continue aspirin and statin.      Hypokalemia -stable, repeat potassium 3.7.    Essential hypertension -> blood pressures are still borderline controlled, but BP CABG but not 1 year progressive.  Tolerated increased dose of Lopressor 25 mg twice daily.  Continue current dose of irbesartan.     Hyperlipidemia with target LDL less than 70 -> LDL currently 99.  Continue rosuvastatin 40 mg daily.    COPD (chronic obstructive pulmonary disease) (HCC)   Aspiration pneumonitis (HCC) / Leukocytosis  Continue Unasyn for presumed aspiration pneumonitis.  White blood cell count was trending down.  No fevers.  Was placed on PPI for presumed aspiration to reduce GERD.  Prediabetes: A1c 6.2-I will likely need close glycemic control perioperatively,   Would consider at a minimum metformin plus or minus SGLT2 inhibitor on discharge.  Plan per Dr. Roxy Manns is CABG tomorrow morning-06/02/2021.   For questions or updates, please contact [place] Please consult [email or web address] for contact info under         Signed, Glenetta Hew, MD  06/01/2020, 6:46 PM

## 2020-06-01 NOTE — Progress Notes (Signed)
ANTICOAGULATION CONSULT NOTE - Follow Up Consult  Pharmacy Consult for heparin Indication: CAD awaiting CABG  Labs: Recent Labs    05/29/20 1730 05/29/20 2329 05/30/20 0137 05/30/20 0422 05/31/20 0112 05/31/20 0827 05/31/20 0957 06/01/20 0034  HGB 13.0  --   --  11.7* 12.0*  --   --  12.2*  HCT 40.7  --   --  37.4* 36.4*  --   --  37.0*  PLT 262  --   --  267 256  --   --  275  APTT  --   --   --  33  --   --   --   --   LABPROT  --   --   --  13.2  --   --   --   --   INR  --   --   --  1.0  --   --   --   --   HEPARINUNFRC  --   --   --   --   --   --  <0.10* 0.14*  CREATININE 1.11  --   --  1.03  --   --   --  1.36*  TROPONINIHS 16   < > 13  --   --  24* 19*  --    < > = values in this interval not displayed.    Assessment: 70yo male remains subtherapeutic on heparin after rate change; no gtt issues or signs of bleeding per RN.  Goal of Therapy:  Heparin level 0.3-0.7 units/ml   Plan:  Will increase heparin gtt by 3 units/kg/hr to 1300 units/hr and check level in 6 hours.    Wynona Neat, PharmD, BCPS  06/01/2020,1:46 AM

## 2020-06-01 NOTE — Progress Notes (Signed)
ANTICOAGULATION CONSULT NOTE - Follow Up Consult  Pharmacy Consult for heparin Indication: CAD awaiting CABG  Labs: Recent Labs    05/29/20 1730 05/29/20 2329 05/30/20 0137 05/30/20 0422 05/31/20 0112 05/31/20 0827 05/31/20 0957 06/01/20 0034 06/01/20 0809  HGB 13.0  --   --  11.7* 12.0*  --   --  12.2*  --   HCT 40.7  --   --  37.4* 36.4*  --   --  37.0*  --   PLT 262  --   --  267 256  --   --  275  --   APTT  --   --   --  33  --   --   --   --   --   LABPROT  --   --   --  13.2  --   --   --   --   --   INR  --   --   --  1.0  --   --   --   --   --   HEPARINUNFRC  --   --   --   --   --   --  <0.10* 0.14* 0.22*  CREATININE 1.11  --   --  1.03  --   --   --  1.36*  --   TROPONINIHS 16   < > 13  --   --  24* 19*  --   --    < > = values in this interval not displayed.    Assessment: 70yo male remains subtherapeutic on heparin with HL 0.22 after rate change.  PLT 275, HGB 12.2  Goal of Therapy:  Heparin level 0.3-0.7 units/ml   Plan:  Will increase heparin by 2 units/kg/hr to 1450 units/hr and re-check level in 6 hours. Heparin level 1630 Daily CBC Scheduled for CABG 5/27 AM  Marya Fossa Hilliard Clark) Ariannah Arenson, PharmD Student 06/01/2020,9:38 AM

## 2020-06-02 ENCOUNTER — Inpatient Hospital Stay (HOSPITAL_COMMUNITY): Payer: BC Managed Care – PPO

## 2020-06-02 ENCOUNTER — Inpatient Hospital Stay (HOSPITAL_COMMUNITY)
Admission: EM | Disposition: A | Discharge status: Home / Self Care | Payer: Self-pay | Source: Home / Self Care | Attending: Thoracic Surgery (Cardiothoracic Vascular Surgery)

## 2020-06-02 ENCOUNTER — Inpatient Hospital Stay (HOSPITAL_COMMUNITY): Payer: BC Managed Care – PPO | Admitting: Certified Registered Nurse Anesthetist

## 2020-06-02 ENCOUNTER — Encounter (HOSPITAL_COMMUNITY): Payer: Self-pay | Admitting: Internal Medicine

## 2020-06-02 DIAGNOSIS — Z951 Presence of aortocoronary bypass graft: Secondary | ICD-10-CM

## 2020-06-02 HISTORY — DX: Presence of aortocoronary bypass graft: Z95.1

## 2020-06-02 HISTORY — PX: CORONARY ARTERY BYPASS GRAFT: SHX141

## 2020-06-02 LAB — POCT I-STAT 7, (LYTES, BLD GAS, ICA,H+H)
Acid-Base Excess: 3 mmol/L — ABNORMAL HIGH (ref 0.0–2.0)
Acid-Base Excess: 3 mmol/L — ABNORMAL HIGH (ref 0.0–2.0)
Acid-Base Excess: 4 mmol/L — ABNORMAL HIGH (ref 0.0–2.0)
Acid-Base Excess: 3 mmol/L — ABNORMAL HIGH (ref 0.0–2.0)
Acid-Base Excess: 1 mmol/L (ref 0.0–2.0)
Acid-Base Excess: 2 mmol/L (ref 0.0–2.0)
Acid-base deficit: 3 mmol/L — ABNORMAL HIGH (ref 0.0–2.0)
Acid-base deficit: 1 mmol/L (ref 0.0–2.0)
Acid-base deficit: 2 mmol/L (ref 0.0–2.0)
Bicarbonate: 28.4 mmol/L — ABNORMAL HIGH (ref 20.0–28.0)
Bicarbonate: 26.7 mmol/L (ref 20.0–28.0)
Bicarbonate: 28.4 mmol/L — ABNORMAL HIGH (ref 20.0–28.0)
Bicarbonate: 26.4 mmol/L (ref 20.0–28.0)
Bicarbonate: 25.7 mmol/L (ref 20.0–28.0)
Bicarbonate: 25.9 mmol/L (ref 20.0–28.0)
Bicarbonate: 23.3 mmol/L (ref 20.0–28.0)
Bicarbonate: 24.3 mmol/L (ref 20.0–28.0)
Bicarbonate: 24 mmol/L (ref 20.0–28.0)
Calcium, Ion: 1.21 mmol/L (ref 1.15–1.40)
Calcium, Ion: 0.93 mmol/L — ABNORMAL LOW (ref 1.15–1.40)
Calcium, Ion: 1.01 mmol/L — ABNORMAL LOW (ref 1.15–1.40)
Calcium, Ion: 0.93 mmol/L — ABNORMAL LOW (ref 1.15–1.40)
Calcium, Ion: 0.93 mmol/L — ABNORMAL LOW (ref 1.15–1.40)
Calcium, Ion: 0.97 mmol/L — ABNORMAL LOW (ref 1.15–1.40)
Calcium, Ion: 1.05 mmol/L — ABNORMAL LOW (ref 1.15–1.40)
Calcium, Ion: 1 mmol/L — ABNORMAL LOW (ref 1.15–1.40)
Calcium, Ion: 1.02 mmol/L — ABNORMAL LOW (ref 1.15–1.40)
HCT: 33 % — ABNORMAL LOW (ref 39.0–52.0)
HCT: 23 % — ABNORMAL LOW (ref 39.0–52.0)
HCT: 24 % — ABNORMAL LOW (ref 39.0–52.0)
HCT: 22 % — ABNORMAL LOW (ref 39.0–52.0)
HCT: 26 % — ABNORMAL LOW (ref 39.0–52.0)
HCT: 26 % — ABNORMAL LOW (ref 39.0–52.0)
HCT: 26 % — ABNORMAL LOW (ref 39.0–52.0)
HCT: 19 % — ABNORMAL LOW (ref 39.0–52.0)
HCT: 19 % — ABNORMAL LOW (ref 39.0–52.0)
Hemoglobin: 11.2 g/dL — ABNORMAL LOW (ref 13.0–17.0)
Hemoglobin: 7.8 g/dL — ABNORMAL LOW (ref 13.0–17.0)
Hemoglobin: 8.2 g/dL — ABNORMAL LOW (ref 13.0–17.0)
Hemoglobin: 7.5 g/dL — ABNORMAL LOW (ref 13.0–17.0)
Hemoglobin: 8.8 g/dL — ABNORMAL LOW (ref 13.0–17.0)
Hemoglobin: 8.8 g/dL — ABNORMAL LOW (ref 13.0–17.0)
Hemoglobin: 8.8 g/dL — ABNORMAL LOW (ref 13.0–17.0)
Hemoglobin: 6.5 g/dL — CL (ref 13.0–17.0)
Hemoglobin: 6.5 g/dL — CL (ref 13.0–17.0)
O2 Saturation: 100 %
O2 Saturation: 100 %
O2 Saturation: 100 %
O2 Saturation: 100 %
O2 Saturation: 100 %
O2 Saturation: 100 %
O2 Saturation: 96 %
O2 Saturation: 96 %
O2 Saturation: 99 %
Patient temperature: 35.7
Patient temperature: 37.1
Patient temperature: 37.3
Potassium: 3.7 mmol/L (ref 3.5–5.1)
Potassium: 4.3 mmol/L (ref 3.5–5.1)
Potassium: 5 mmol/L (ref 3.5–5.1)
Potassium: 5 mmol/L (ref 3.5–5.1)
Potassium: 4.4 mmol/L (ref 3.5–5.1)
Potassium: 5.3 mmol/L — ABNORMAL HIGH (ref 3.5–5.1)
Potassium: 4.2 mmol/L (ref 3.5–5.1)
Potassium: 4.4 mmol/L (ref 3.5–5.1)
Potassium: 4.4 mmol/L (ref 3.5–5.1)
Sodium: 139 mmol/L (ref 135–145)
Sodium: 135 mmol/L (ref 135–145)
Sodium: 139 mmol/L (ref 135–145)
Sodium: 137 mmol/L (ref 135–145)
Sodium: 137 mmol/L (ref 135–145)
Sodium: 140 mmol/L (ref 135–145)
Sodium: 139 mmol/L (ref 135–145)
Sodium: 141 mmol/L (ref 135–145)
Sodium: 141 mmol/L (ref 135–145)
TCO2: 30 mmol/L (ref 22–32)
TCO2: 28 mmol/L (ref 22–32)
TCO2: 30 mmol/L (ref 22–32)
TCO2: 27 mmol/L (ref 22–32)
TCO2: 27 mmol/L (ref 22–32)
TCO2: 27 mmol/L (ref 22–32)
TCO2: 25 mmol/L (ref 22–32)
TCO2: 26 mmol/L (ref 22–32)
TCO2: 25 mmol/L (ref 22–32)
pCO2 arterial: 46.2 mmHg (ref 32.0–48.0)
pCO2 arterial: 35.6 mmHg (ref 32.0–48.0)
pCO2 arterial: 40.2 mmHg (ref 32.0–48.0)
pCO2 arterial: 33.1 mmHg (ref 32.0–48.0)
pCO2 arterial: 35.9 mmHg (ref 32.0–48.0)
pCO2 arterial: 39.1 mmHg (ref 32.0–48.0)
pCO2 arterial: 42.3 mmHg (ref 32.0–48.0)
pCO2 arterial: 43.1 mmHg (ref 32.0–48.0)
pCO2 arterial: 44.4 mmHg (ref 32.0–48.0)
pH, Arterial: 7.397 (ref 7.350–7.450)
pH, Arterial: 7.457 — ABNORMAL HIGH (ref 7.350–7.450)
pH, Arterial: 7.483 — ABNORMAL HIGH (ref 7.350–7.450)
pH, Arterial: 7.51 — ABNORMAL HIGH (ref 7.350–7.450)
pH, Arterial: 7.425 (ref 7.350–7.450)
pH, Arterial: 7.467 — ABNORMAL HIGH (ref 7.350–7.450)
pH, Arterial: 7.342 — ABNORMAL LOW (ref 7.350–7.450)
pH, Arterial: 7.358 (ref 7.350–7.450)
pH, Arterial: 7.342 — ABNORMAL LOW (ref 7.350–7.450)
pO2, Arterial: 543 mmHg — ABNORMAL HIGH (ref 83.0–108.0)
pO2, Arterial: 418 mmHg — ABNORMAL HIGH (ref 83.0–108.0)
pO2, Arterial: 437 mmHg — ABNORMAL HIGH (ref 83.0–108.0)
pO2, Arterial: 420 mmHg — ABNORMAL HIGH (ref 83.0–108.0)
pO2, Arterial: 370 mmHg — ABNORMAL HIGH (ref 83.0–108.0)
pO2, Arterial: 388 mmHg — ABNORMAL HIGH (ref 83.0–108.0)
pO2, Arterial: 79 mmHg — ABNORMAL LOW (ref 83.0–108.0)
pO2, Arterial: 88 mmHg (ref 83.0–108.0)
pO2, Arterial: 135 mmHg — ABNORMAL HIGH (ref 83.0–108.0)

## 2020-06-02 LAB — POCT I-STAT, CHEM 8
BUN: 14 mg/dL (ref 8–23)
BUN: 12 mg/dL (ref 8–23)
BUN: 13 mg/dL (ref 8–23)
BUN: 11 mg/dL (ref 8–23)
BUN: 11 mg/dL (ref 8–23)
Calcium, Ion: 1.21 mmol/L (ref 1.15–1.40)
Calcium, Ion: 1.02 mmol/L — ABNORMAL LOW (ref 1.15–1.40)
Calcium, Ion: 1.22 mmol/L (ref 1.15–1.40)
Calcium, Ion: 0.96 mmol/L — ABNORMAL LOW (ref 1.15–1.40)
Calcium, Ion: 0.95 mmol/L — ABNORMAL LOW (ref 1.15–1.40)
Chloride: 101 mmol/L (ref 98–111)
Chloride: 101 mmol/L (ref 98–111)
Chloride: 98 mmol/L (ref 98–111)
Chloride: 100 mmol/L (ref 98–111)
Chloride: 102 mmol/L (ref 98–111)
Creatinine, Ser: 1.1 mg/dL (ref 0.61–1.24)
Creatinine, Ser: 0.9 mg/dL (ref 0.61–1.24)
Creatinine, Ser: 1 mg/dL (ref 0.61–1.24)
Creatinine, Ser: 0.8 mg/dL (ref 0.61–1.24)
Creatinine, Ser: 0.8 mg/dL (ref 0.61–1.24)
Glucose, Bld: 94 mg/dL (ref 70–99)
Glucose, Bld: 101 mg/dL — ABNORMAL HIGH (ref 70–99)
Glucose, Bld: 98 mg/dL (ref 70–99)
Glucose, Bld: 95 mg/dL (ref 70–99)
Glucose, Bld: 117 mg/dL — ABNORMAL HIGH (ref 70–99)
HCT: 31 % — ABNORMAL LOW (ref 39.0–52.0)
HCT: 24 % — ABNORMAL LOW (ref 39.0–52.0)
HCT: 28 % — ABNORMAL LOW (ref 39.0–52.0)
HCT: 20 % — ABNORMAL LOW (ref 39.0–52.0)
HCT: 20 % — ABNORMAL LOW (ref 39.0–52.0)
Hemoglobin: 10.5 g/dL — ABNORMAL LOW (ref 13.0–17.0)
Hemoglobin: 8.2 g/dL — ABNORMAL LOW (ref 13.0–17.0)
Hemoglobin: 9.5 g/dL — ABNORMAL LOW (ref 13.0–17.0)
Hemoglobin: 6.8 g/dL — CL (ref 13.0–17.0)
Hemoglobin: 6.8 g/dL — CL (ref 13.0–17.0)
Potassium: 3.7 mmol/L (ref 3.5–5.1)
Potassium: 3.9 mmol/L (ref 3.5–5.1)
Potassium: 5 mmol/L (ref 3.5–5.1)
Potassium: 5.3 mmol/L — ABNORMAL HIGH (ref 3.5–5.1)
Potassium: 4.5 mmol/L (ref 3.5–5.1)
Sodium: 138 mmol/L (ref 135–145)
Sodium: 134 mmol/L — ABNORMAL LOW (ref 135–145)
Sodium: 137 mmol/L (ref 135–145)
Sodium: 135 mmol/L (ref 135–145)
Sodium: 138 mmol/L (ref 135–145)
TCO2: 28 mmol/L (ref 22–32)
TCO2: 26 mmol/L (ref 22–32)
TCO2: 29 mmol/L (ref 22–32)
TCO2: 27 mmol/L (ref 22–32)
TCO2: 27 mmol/L (ref 22–32)

## 2020-06-02 LAB — CBC
HCT: 35.5 % — ABNORMAL LOW (ref 39.0–52.0)
HCT: 26 % — ABNORMAL LOW (ref 39.0–52.0)
HCT: 20.5 % — ABNORMAL LOW (ref 39.0–52.0)
Hemoglobin: 11.9 g/dL — ABNORMAL LOW (ref 13.0–17.0)
Hemoglobin: 8.4 g/dL — ABNORMAL LOW (ref 13.0–17.0)
Hemoglobin: 6.6 g/dL — CL (ref 13.0–17.0)
MCH: 29.8 pg (ref 26.0–34.0)
MCH: 29.7 pg (ref 26.0–34.0)
MCH: 29.5 pg (ref 26.0–34.0)
MCHC: 33.5 g/dL (ref 30.0–36.0)
MCHC: 32.3 g/dL (ref 30.0–36.0)
MCHC: 32.2 g/dL (ref 30.0–36.0)
MCV: 88.8 fL (ref 80.0–100.0)
MCV: 91.9 fL (ref 80.0–100.0)
MCV: 91.5 fL (ref 80.0–100.0)
Platelets: 290 10*3/uL (ref 150–400)
Platelets: 198 10*3/uL (ref 150–400)
Platelets: 123 10*3/uL — ABNORMAL LOW (ref 150–400)
RBC: 4 MIL/uL — ABNORMAL LOW (ref 4.22–5.81)
RBC: 2.83 MIL/uL — ABNORMAL LOW (ref 4.22–5.81)
RBC: 2.24 MIL/uL — ABNORMAL LOW (ref 4.22–5.81)
RDW: 13.4 % (ref 11.5–15.5)
RDW: 13.6 % (ref 11.5–15.5)
RDW: 13.7 % (ref 11.5–15.5)
WBC: 8.2 10*3/uL (ref 4.0–10.5)
WBC: 11.7 10*3/uL — ABNORMAL HIGH (ref 4.0–10.5)
WBC: 7.9 10*3/uL (ref 4.0–10.5)
nRBC: 0 % (ref 0.0–0.2)
nRBC: 0 % (ref 0.0–0.2)
nRBC: 0 % (ref 0.0–0.2)

## 2020-06-02 LAB — HEMOGLOBIN AND HEMATOCRIT, BLOOD
HCT: 25.6 % — ABNORMAL LOW (ref 39.0–52.0)
HCT: 24.4 % — ABNORMAL LOW (ref 39.0–52.0)
Hemoglobin: 8.6 g/dL — ABNORMAL LOW (ref 13.0–17.0)
Hemoglobin: 8 g/dL — ABNORMAL LOW (ref 13.0–17.0)

## 2020-06-02 LAB — GLUCOSE, CAPILLARY
Glucose-Capillary: 119 mg/dL — ABNORMAL HIGH (ref 70–99)
Glucose-Capillary: 110 mg/dL — ABNORMAL HIGH (ref 70–99)
Glucose-Capillary: 111 mg/dL — ABNORMAL HIGH (ref 70–99)
Glucose-Capillary: 114 mg/dL — ABNORMAL HIGH (ref 70–99)
Glucose-Capillary: 112 mg/dL — ABNORMAL HIGH (ref 70–99)
Glucose-Capillary: 112 mg/dL — ABNORMAL HIGH (ref 70–99)
Glucose-Capillary: 137 mg/dL — ABNORMAL HIGH (ref 70–99)
Glucose-Capillary: 152 mg/dL — ABNORMAL HIGH (ref 70–99)
Glucose-Capillary: 121 mg/dL — ABNORMAL HIGH (ref 70–99)

## 2020-06-02 LAB — APTT: aPTT: 34 seconds (ref 24–36)

## 2020-06-02 LAB — BASIC METABOLIC PANEL
Anion gap: 8 (ref 5–15)
Anion gap: 7 (ref 5–15)
BUN: 14 mg/dL (ref 8–23)
BUN: 12 mg/dL (ref 8–23)
CO2: 26 mmol/L (ref 22–32)
CO2: 24 mmol/L (ref 22–32)
Calcium: 8.9 mg/dL (ref 8.9–10.3)
Calcium: 7.2 mg/dL — ABNORMAL LOW (ref 8.9–10.3)
Chloride: 102 mmol/L (ref 98–111)
Chloride: 106 mmol/L (ref 98–111)
Creatinine, Ser: 1.23 mg/dL (ref 0.61–1.24)
Creatinine, Ser: 1.08 mg/dL (ref 0.61–1.24)
GFR, Estimated: >60 mL/min (ref >60)
GFR, Estimated: >60 mL/min (ref >60)
Glucose, Bld: 101 mg/dL — ABNORMAL HIGH (ref 70–99)
Glucose, Bld: 123 mg/dL — ABNORMAL HIGH (ref 70–99)
Potassium: 3.8 mmol/L (ref 3.5–5.1)
Potassium: 4.9 mmol/L (ref 3.5–5.1)
Sodium: 136 mmol/L (ref 135–145)
Sodium: 137 mmol/L (ref 135–145)

## 2020-06-02 LAB — MAGNESIUM: Magnesium: 3.2 mg/dL — ABNORMAL HIGH (ref 1.7–2.4)

## 2020-06-02 LAB — POCT I-STAT EG7
Acid-Base Excess: 1 mmol/L (ref 0.0–2.0)
Bicarbonate: 26 mmol/L (ref 20.0–28.0)
Calcium, Ion: 1.04 mmol/L — ABNORMAL LOW (ref 1.15–1.40)
HCT: 24 % — ABNORMAL LOW (ref 39.0–52.0)
Hemoglobin: 8.2 g/dL — ABNORMAL LOW (ref 13.0–17.0)
O2 Saturation: 82 %
Potassium: 5.9 mmol/L — ABNORMAL HIGH (ref 3.5–5.1)
Sodium: 138 mmol/L (ref 135–145)
TCO2: 27 mmol/L (ref 22–32)
pCO2, Ven: 41.7 mmHg — ABNORMAL LOW (ref 44.0–60.0)
pH, Ven: 7.402 (ref 7.250–7.430)
pO2, Ven: 46 mmHg — ABNORMAL HIGH (ref 32.0–45.0)

## 2020-06-02 LAB — PROTIME-INR
INR: 1.5 — ABNORMAL HIGH (ref 0.8–1.2)
Prothrombin Time: 17.8 seconds — ABNORMAL HIGH (ref 11.4–15.2)

## 2020-06-02 LAB — PLATELET COUNT: Platelets: 208 10*3/uL (ref 150–400)

## 2020-06-02 LAB — PREPARE RBC (CROSSMATCH)

## 2020-06-02 LAB — HEMOGLOBIN A1C
Hgb A1c MFr Bld: 6.2 % — ABNORMAL HIGH (ref 4.8–5.6)
Mean Plasma Glucose: 131 mg/dL

## 2020-06-02 LAB — HEPARIN LEVEL (UNFRACTIONATED): Heparin Unfractionated: 0.26 IU/mL — ABNORMAL LOW (ref 0.30–0.70)

## 2020-06-02 IMAGING — DX DG CHEST 1V PORT
1 series · 1 of 1 positions shown · non-contrast
Comparison: [DATE]

CLINICAL DATA: Follow-up CABG

EXAM:
PORTABLE CHEST 1 VIEW

[chest ap]
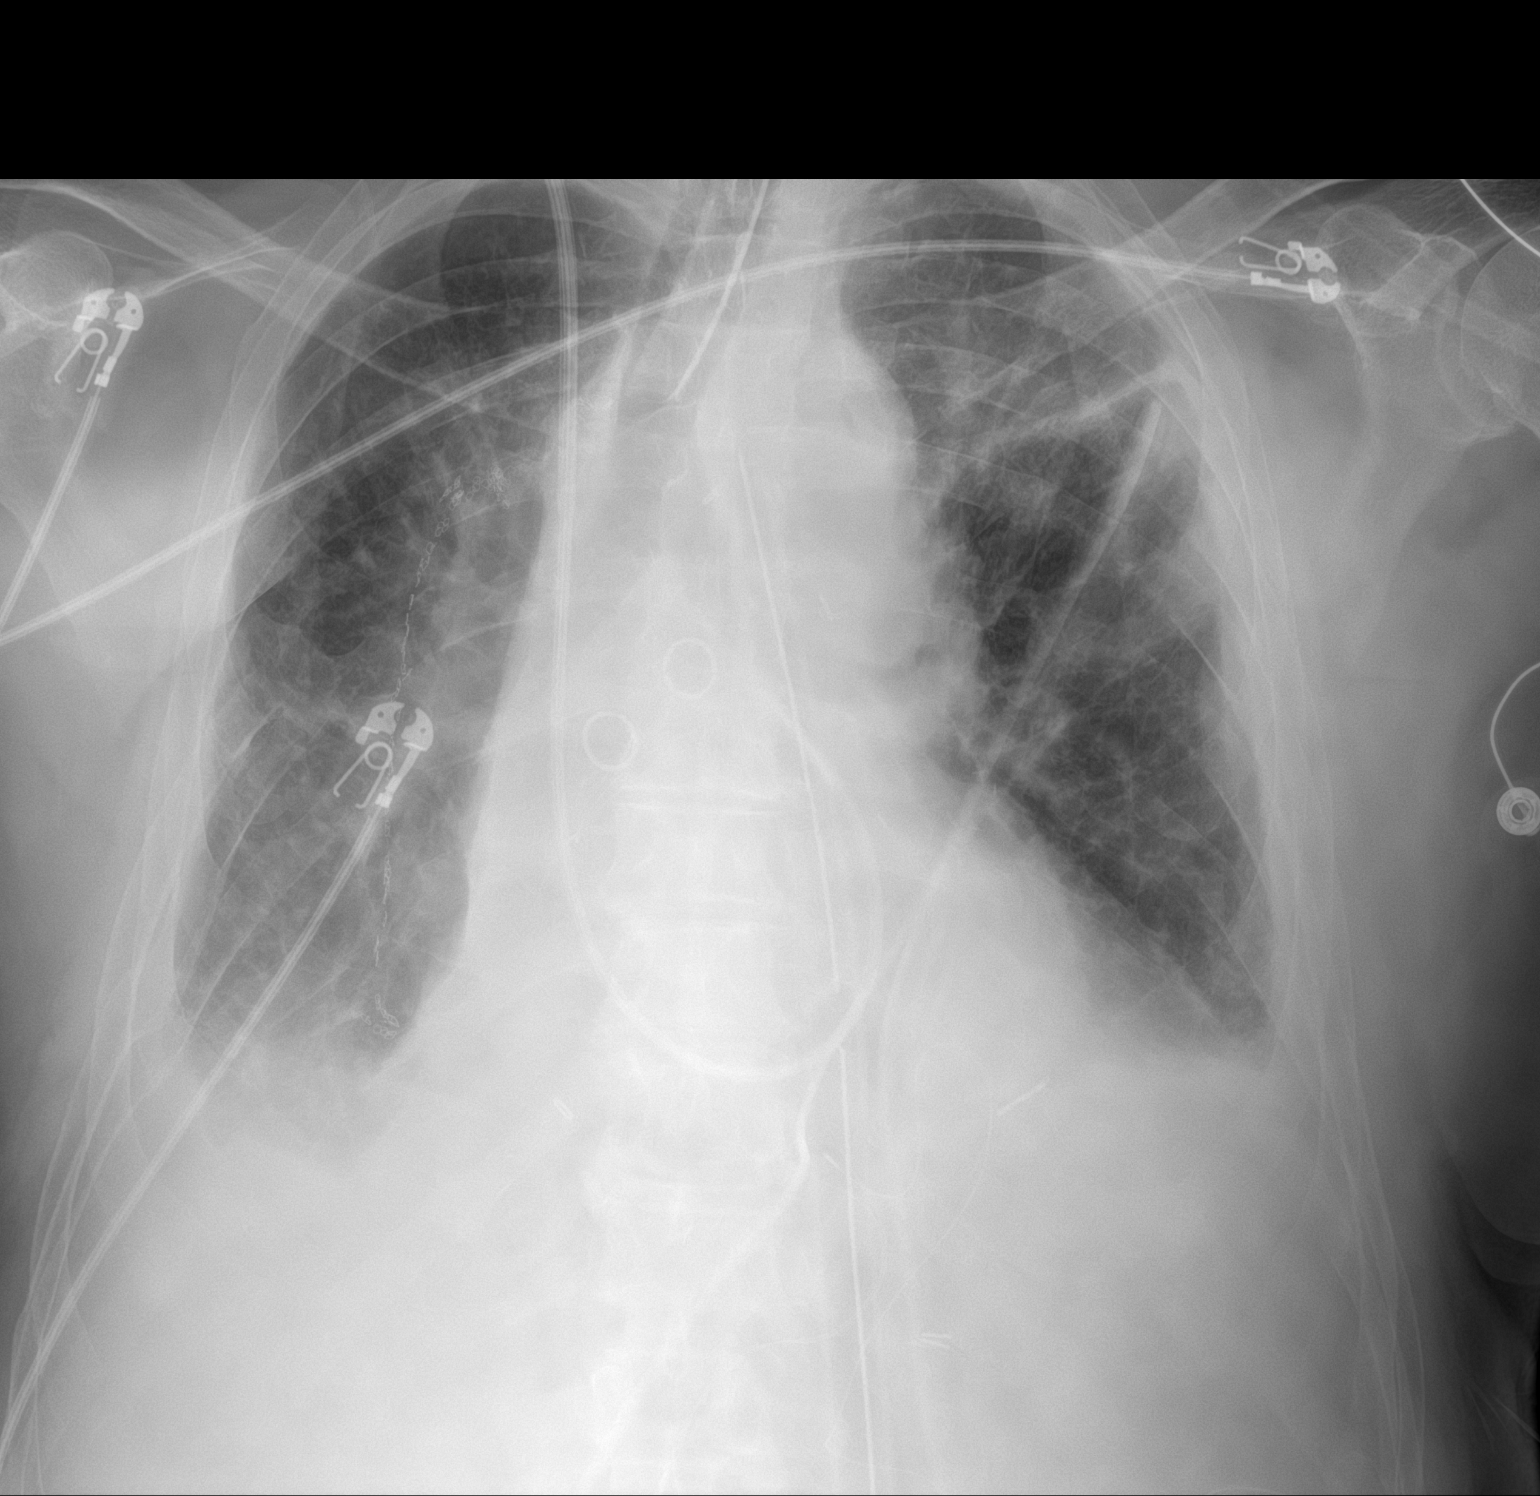

[1 of 1 positions shown; findings below may reference images not displayed]

FINDINGS: Previous median sternotomy and CABG. Endotracheal tube tip 4 cm
above the carina. Swan-Ganz catheter tip in the main pulmonary
artery. Mediastinal drain in place. Left chest tube in place. No
pneumothorax. Mild edema and mild basilar atelectasis.
IMPRESSION: Lines and tubes well positioned. Mild edema and mild basilar
atelectasis. No pneumothorax peer

## 2020-06-02 SURGERY — CORONARY ARTERY BYPASS GRAFTING (CABG)
Anesthesia: General | Site: Chest

## 2020-06-02 MED ORDER — TRAMADOL HCL 50 MG PO TABS
50.0000 mg | ORAL_TABLET | ORAL | Status: DC | PRN
Start: 2020-06-02 — End: 2020-06-04
  Administered 2020-06-03: 50 mg via ORAL
  Administered 2020-06-03 – 2020-06-04 (×2): 100 mg via ORAL
  Filled 2020-06-02: qty 2
  Filled 2020-06-02: qty 1
  Filled 2020-06-02: qty 2

## 2020-06-02 MED ORDER — SODIUM CHLORIDE 0.9% IV SOLUTION: Freq: Once | INTRAVENOUS | Status: AC

## 2020-06-02 MED ORDER — LACTATED RINGERS IV SOLN: 500.0000 mL | Freq: Once | INTRAVENOUS | Status: DC | PRN

## 2020-06-02 MED ORDER — PROTAMINE SULFATE 10 MG/ML IV SOLN
INTRAVENOUS | Status: AC
  Filled 2020-06-02: qty 25

## 2020-06-02 MED ORDER — FENTANYL CITRATE (PF) 250 MCG/5ML IJ SOLN
INTRAMUSCULAR | Status: AC
  Filled 2020-06-02: qty 25

## 2020-06-02 MED ORDER — MIDAZOLAM HCL 2 MG/2ML IJ SOLN: 2.0000 mg | INTRAMUSCULAR | Status: DC | PRN

## 2020-06-02 MED ORDER — ACETAMINOPHEN 160 MG/5ML PO SOLN: 650.0000 mg | Freq: Once | ORAL | Status: AC

## 2020-06-02 MED ORDER — METOPROLOL TARTRATE 25 MG/10 ML ORAL SUSPENSION
12.5000 mg | Freq: Two times a day (BID) | ORAL | Status: DC
  Filled 2020-06-02 (×2): qty 5

## 2020-06-02 MED ORDER — ROCURONIUM BROMIDE 10 MG/ML (PF) SYRINGE
PREFILLED_SYRINGE | INTRAVENOUS | Status: AC
  Filled 2020-06-02: qty 20

## 2020-06-02 MED ORDER — ORAL CARE MOUTH RINSE
15.0000 mL | Freq: Two times a day (BID) | OROMUCOSAL | Status: DC
  Administered 2020-06-02 – 2020-06-07 (×10): 15 mL via OROMUCOSAL

## 2020-06-02 MED ORDER — FAMOTIDINE IN NACL 20-0.9 MG/50ML-% IV SOLN
20.0000 mg | Freq: Two times a day (BID) | INTRAVENOUS | Status: AC
  Administered 2020-06-02 (×2): 20 mg via INTRAVENOUS
  Filled 2020-06-02 (×2): qty 50

## 2020-06-02 MED ORDER — NITROGLYCERIN IN D5W 200-5 MCG/ML-% IV SOLN: 0.0000 ug/min | INTRAVENOUS | Status: DC

## 2020-06-02 MED ORDER — SODIUM CHLORIDE 0.9% FLUSH: 3.0000 mL | INTRAVENOUS | Status: DC | PRN

## 2020-06-02 MED ORDER — LACTATED RINGERS IV SOLN: INTRAVENOUS | Status: DC | PRN

## 2020-06-02 MED ORDER — ACETAMINOPHEN 650 MG RE SUPP
650.0000 mg | Freq: Once | RECTAL | Status: AC
  Administered 2020-06-02: 650 mg via RECTAL

## 2020-06-02 MED ORDER — SODIUM CHLORIDE 0.45 % IV SOLN: INTRAVENOUS | Status: DC | PRN

## 2020-06-02 MED ORDER — SODIUM CHLORIDE 0.9% FLUSH
10.0000 mL | Freq: Two times a day (BID) | INTRAVENOUS | Status: DC
  Administered 2020-06-02 – 2020-06-03 (×3): 10 mL

## 2020-06-02 MED ORDER — SODIUM CHLORIDE 0.9 % IV SOLN: INTRAVENOUS | Status: AC

## 2020-06-02 MED ORDER — METOPROLOL TARTRATE 5 MG/5ML IV SOLN
2.5000 mg | INTRAVENOUS | Status: DC | PRN
Start: 2020-06-02 — End: 2020-06-08
  Administered 2020-06-07: 2.5 mg via INTRAVENOUS
  Administered 2020-06-08: 5 mg via INTRAVENOUS
  Filled 2020-06-02 (×2): qty 5

## 2020-06-02 MED ORDER — LIDOCAINE 2% (20 MG/ML) 5 ML SYRINGE
INTRAMUSCULAR | Status: AC
  Filled 2020-06-02: qty 5

## 2020-06-02 MED ORDER — ALBUMIN HUMAN 5 % IV SOLN: INTRAVENOUS | Status: DC | PRN

## 2020-06-02 MED ORDER — OXYCODONE HCL 5 MG PO TABS
5.0000 mg | ORAL_TABLET | ORAL | Status: DC | PRN
Start: 2020-06-02 — End: 2020-06-05
  Administered 2020-06-03: 5 mg via ORAL
  Administered 2020-06-04: 10 mg via ORAL
  Administered 2020-06-04: 5 mg via ORAL
  Filled 2020-06-02 (×3): qty 2

## 2020-06-02 MED ORDER — SODIUM CHLORIDE (PF) 0.9 % IJ SOLN
OROMUCOSAL | Status: DC | PRN
  Administered 2020-06-02: 4 mL via TOPICAL
  Administered 2020-06-02 (×2): 8 mL via TOPICAL

## 2020-06-02 MED ORDER — FENTANYL CITRATE (PF) 250 MCG/5ML IJ SOLN
INTRAMUSCULAR | Status: DC | PRN
  Administered 2020-06-02: 50 ug via INTRAVENOUS
  Administered 2020-06-02 (×2): 100 ug via INTRAVENOUS
  Administered 2020-06-02: 50 ug via INTRAVENOUS
  Administered 2020-06-02: 150 ug via INTRAVENOUS
  Administered 2020-06-02: 50 ug via INTRAVENOUS
  Administered 2020-06-02: 150 ug via INTRAVENOUS
  Administered 2020-06-02 (×2): 50 ug via INTRAVENOUS
  Administered 2020-06-02: 100 ug via INTRAVENOUS
  Administered 2020-06-02: 500 ug via INTRAVENOUS
  Administered 2020-06-02: 100 ug via INTRAVENOUS
  Administered 2020-06-02: 50 ug via INTRAVENOUS
  Administered 2020-06-02: 100 ug via INTRAVENOUS
  Administered 2020-06-02: 150 ug via INTRAVENOUS

## 2020-06-02 MED ORDER — ASPIRIN EC 325 MG PO TBEC
325.0000 mg | DELAYED_RELEASE_TABLET | Freq: Every day | ORAL | Status: DC
  Administered 2020-06-03 – 2020-06-06 (×4): 325 mg via ORAL
  Filled 2020-06-02 (×4): qty 1

## 2020-06-02 MED ORDER — SODIUM CHLORIDE 0.9 % IV SOLN: INTRAVENOUS | Status: DC

## 2020-06-02 MED ORDER — MIDAZOLAM HCL (PF) 10 MG/2ML IJ SOLN
INTRAMUSCULAR | Status: AC
  Filled 2020-06-02: qty 2

## 2020-06-02 MED ORDER — PANTOPRAZOLE SODIUM 40 MG PO TBEC: 40.0000 mg | DELAYED_RELEASE_TABLET | Freq: Every day | ORAL | Status: DC

## 2020-06-02 MED ORDER — LACTATED RINGERS IV SOLN: INTRAVENOUS | Status: DC

## 2020-06-02 MED ORDER — VANCOMYCIN HCL 1000 MG IV SOLR
INTRAVENOUS | Status: DC | PRN
  Administered 2020-06-02: 1000 mL

## 2020-06-02 MED ORDER — BISACODYL 10 MG RE SUPP: 10.0000 mg | Freq: Every day | RECTAL | Status: DC

## 2020-06-02 MED ORDER — MORPHINE SULFATE (PF) 2 MG/ML IV SOLN
1.0000 mg | INTRAVENOUS | Status: DC | PRN
  Administered 2020-06-02 – 2020-06-03 (×4): 2 mg via INTRAVENOUS
  Filled 2020-06-02 (×5): qty 1

## 2020-06-02 MED ORDER — ONDANSETRON HCL 4 MG/2ML IJ SOLN
4.0000 mg | Freq: Four times a day (QID) | INTRAMUSCULAR | Status: DC | PRN
  Administered 2020-06-02 – 2020-06-05 (×3): 4 mg via INTRAVENOUS
  Filled 2020-06-02 (×3): qty 2

## 2020-06-02 MED ORDER — PHENYLEPHRINE 40 MCG/ML (10ML) SYRINGE FOR IV PUSH (FOR BLOOD PRESSURE SUPPORT)
PREFILLED_SYRINGE | INTRAVENOUS | Status: AC
  Filled 2020-06-02: qty 20

## 2020-06-02 MED ORDER — CHLORHEXIDINE GLUCONATE 0.12 % MT SOLN
15.0000 mL | OROMUCOSAL | Status: AC
  Administered 2020-06-02: 15 mL via OROMUCOSAL

## 2020-06-02 MED ORDER — PROPOFOL 10 MG/ML IV BOLUS
INTRAVENOUS | Status: DC | PRN
  Administered 2020-06-02: 30 mg via INTRAVENOUS

## 2020-06-02 MED ORDER — FENTANYL CITRATE (PF) 250 MCG/5ML IJ SOLN
INTRAMUSCULAR | Status: AC
  Filled 2020-06-02: qty 5

## 2020-06-02 MED ORDER — 0.9 % SODIUM CHLORIDE (POUR BTL) OPTIME
TOPICAL | Status: DC | PRN
  Administered 2020-06-02: 5000 mL

## 2020-06-02 MED ORDER — METOPROLOL TARTRATE 12.5 MG HALF TABLET
12.5000 mg | ORAL_TABLET | Freq: Two times a day (BID) | ORAL | Status: DC
  Administered 2020-06-02 – 2020-06-04 (×5): 12.5 mg via ORAL
  Filled 2020-06-02 (×5): qty 1

## 2020-06-02 MED ORDER — ALBUMIN HUMAN 5 % IV SOLN
250.0000 mL | INTRAVENOUS | Status: AC | PRN
  Administered 2020-06-02 (×4): 12.5 g via INTRAVENOUS
  Filled 2020-06-02 (×2): qty 250

## 2020-06-02 MED ORDER — MIDAZOLAM HCL 5 MG/5ML IJ SOLN
INTRAMUSCULAR | Status: DC | PRN
  Administered 2020-06-02: 3 mg via INTRAVENOUS
  Administered 2020-06-02: 2 mg via INTRAVENOUS
  Administered 2020-06-02: 3 mg via INTRAVENOUS
  Administered 2020-06-02 (×2): 1 mg via INTRAVENOUS

## 2020-06-02 MED ORDER — MAGNESIUM SULFATE 4 GM/100ML IV SOLN
4.0000 g | Freq: Once | INTRAVENOUS | Status: AC
  Administered 2020-06-02: 4 g via INTRAVENOUS
  Filled 2020-06-02: qty 100

## 2020-06-02 MED ORDER — SODIUM CHLORIDE 0.9% FLUSH
3.0000 mL | Freq: Two times a day (BID) | INTRAVENOUS | Status: DC
  Administered 2020-06-03 (×2): 3 mL via INTRAVENOUS

## 2020-06-02 MED ORDER — DEXTROSE 50 % IV SOLN: 0.0000 mL | INTRAVENOUS | Status: DC | PRN

## 2020-06-02 MED ORDER — CHLORHEXIDINE GLUCONATE 0.12% ORAL RINSE (MEDLINE KIT): 15.0000 mL | Freq: Two times a day (BID) | OROMUCOSAL | Status: DC

## 2020-06-02 MED ORDER — ORAL CARE MOUTH RINSE
15.0000 mL | OROMUCOSAL | Status: DC
  Administered 2020-06-02 (×2): 15 mL via OROMUCOSAL

## 2020-06-02 MED ORDER — POTASSIUM CHLORIDE 10 MEQ/50ML IV SOLN: 10.0000 meq | INTRAVENOUS | Status: AC

## 2020-06-02 MED ORDER — DOCUSATE SODIUM 100 MG PO CAPS
200.0000 mg | ORAL_CAPSULE | Freq: Every day | ORAL | Status: DC
  Administered 2020-06-03 – 2020-06-08 (×6): 200 mg via ORAL
  Filled 2020-06-02 (×6): qty 2

## 2020-06-02 MED ORDER — SODIUM CHLORIDE 0.9% FLUSH: 10.0000 mL | INTRAVENOUS | Status: DC | PRN

## 2020-06-02 MED ORDER — ACETAMINOPHEN 500 MG PO TABS
1000.0000 mg | ORAL_TABLET | Freq: Four times a day (QID) | ORAL | Status: AC
  Administered 2020-06-03 – 2020-06-07 (×14): 1000 mg via ORAL
  Filled 2020-06-02 (×16): qty 2

## 2020-06-02 MED ORDER — HEPARIN SODIUM (PORCINE) 1000 UNIT/ML IJ SOLN
INTRAMUSCULAR | Status: AC
  Filled 2020-06-02: qty 1

## 2020-06-02 MED ORDER — ACETAMINOPHEN 160 MG/5ML PO SOLN: 1000.0000 mg | Freq: Four times a day (QID) | ORAL | Status: DC

## 2020-06-02 MED ORDER — ASPIRIN 81 MG PO CHEW: 324.0000 mg | CHEWABLE_TABLET | Freq: Every day | ORAL | Status: DC

## 2020-06-02 MED ORDER — LIDOCAINE 2% (20 MG/ML) 5 ML SYRINGE
INTRAMUSCULAR | Status: DC | PRN
  Administered 2020-06-02: 20 mg via INTRAVENOUS

## 2020-06-02 MED ORDER — PHENYLEPHRINE HCL-NACL 20-0.9 MG/250ML-% IV SOLN
0.0000 ug/min | INTRAVENOUS | Status: DC
  Administered 2020-06-02: 20 ug/min via INTRAVENOUS
  Filled 2020-06-02: qty 250

## 2020-06-02 MED ORDER — CEFAZOLIN SODIUM-DEXTROSE 2-4 GM/100ML-% IV SOLN
2.0000 g | Freq: Three times a day (TID) | INTRAVENOUS | Status: DC
  Administered 2020-06-02 – 2020-06-03 (×2): 2 g via INTRAVENOUS
  Filled 2020-06-02 (×3): qty 100

## 2020-06-02 MED ORDER — CHLORHEXIDINE GLUCONATE CLOTH 2 % EX PADS
6.0000 | MEDICATED_PAD | Freq: Every day | CUTANEOUS | Status: DC
  Administered 2020-06-02 – 2020-06-03 (×2): 6 via TOPICAL

## 2020-06-02 MED ORDER — BISACODYL 5 MG PO TBEC
10.0000 mg | DELAYED_RELEASE_TABLET | Freq: Every day | ORAL | Status: DC
  Administered 2020-06-03 – 2020-06-07 (×5): 10 mg via ORAL
  Filled 2020-06-02 (×5): qty 2

## 2020-06-02 MED ORDER — DEXMEDETOMIDINE HCL IN NACL 400 MCG/100ML IV SOLN
0.0000 ug/kg/h | INTRAVENOUS | Status: DC
  Filled 2020-06-02: qty 100

## 2020-06-02 MED ORDER — VANCOMYCIN HCL IN DEXTROSE 1-5 GM/200ML-% IV SOLN
1000.0000 mg | Freq: Once | INTRAVENOUS | Status: AC
  Administered 2020-06-02: 1000 mg via INTRAVENOUS
  Filled 2020-06-02: qty 200

## 2020-06-02 MED ORDER — INSULIN REGULAR(HUMAN) IN NACL 100-0.9 UT/100ML-% IV SOLN: INTRAVENOUS | Status: DC

## 2020-06-02 MED ORDER — SODIUM CHLORIDE 0.9 % IV SOLN: 250.0000 mL | INTRAVENOUS | Status: DC

## 2020-06-02 MED ORDER — ROCURONIUM BROMIDE 10 MG/ML (PF) SYRINGE
PREFILLED_SYRINGE | INTRAVENOUS | Status: DC | PRN
  Administered 2020-06-02: 70 mg via INTRAVENOUS
  Administered 2020-06-02 (×2): 50 mg via INTRAVENOUS
  Administered 2020-06-02: 30 mg via INTRAVENOUS

## 2020-06-02 MED ORDER — PLASMA-LYTE 148 IV SOLN
INTRAVENOUS | Status: DC | PRN
  Administered 2020-06-02: 500 mL via INTRAVASCULAR

## 2020-06-02 MED ORDER — HEPARIN SODIUM (PORCINE) 1000 UNIT/ML IJ SOLN
INTRAMUSCULAR | Status: DC | PRN
  Administered 2020-06-02: 27000 [IU] via INTRAVENOUS

## 2020-06-02 MED ORDER — HEMOSTATIC AGENTS (NO CHARGE) OPTIME
TOPICAL | Status: DC | PRN
  Administered 2020-06-02: 1 via TOPICAL

## 2020-06-02 MED ORDER — PROPOFOL 10 MG/ML IV BOLUS
INTRAVENOUS | Status: AC
  Filled 2020-06-02: qty 20

## 2020-06-02 SURGICAL SUPPLY — 105 items
BAG DECANTER FOR FLEXI CONT (MISCELLANEOUS) ×3 IMPLANT
BLADE CLIPPER SURG (BLADE) ×3 IMPLANT
BLADE STERNUM SYSTEM 6 (BLADE) ×3 IMPLANT
BLADE SURG 11 STRL SS (BLADE) ×3 IMPLANT
BNDG ELASTIC 4X5.8 VLCR STR LF (GAUZE/BANDAGES/DRESSINGS) ×3 IMPLANT
BNDG ELASTIC 6X5.8 VLCR STR LF (GAUZE/BANDAGES/DRESSINGS) ×3 IMPLANT
BNDG GAUZE ELAST 4 BULKY (GAUZE/BANDAGES/DRESSINGS) ×3 IMPLANT
CANISTER SUCT 3000ML PPV (MISCELLANEOUS) ×3 IMPLANT
CANNULA EZ GLIDE AORTIC 21FR (CANNULA) ×3 IMPLANT
CATH CPB KIT OWEN (MISCELLANEOUS) ×3 IMPLANT
CATH THORACIC 36FR (CATHETERS) ×3 IMPLANT
CLIP RETRACTION 3.0MM CORONARY (MISCELLANEOUS) ×3 IMPLANT
CLIP VESOCCLUDE MED 24/CT (CLIP) IMPLANT
CLIP VESOCCLUDE SM WIDE 24/CT (CLIP) IMPLANT
CONN ST 1/4X3/8  BEN (MISCELLANEOUS) ×2
CONN ST 1/4X3/8 BEN (MISCELLANEOUS) ×4 IMPLANT
CONTAINER PROTECT SURGISLUSH (MISCELLANEOUS) ×6 IMPLANT
DERMABOND ADVANCED (GAUZE/BANDAGES/DRESSINGS) ×1
DERMABOND ADVANCED .7 DNX12 (GAUZE/BANDAGES/DRESSINGS) ×2 IMPLANT
DRAIN CHANNEL 32F RND 10.7 FF (WOUND CARE) ×6 IMPLANT
DRAPE CARDIOVASCULAR INCISE (DRAPES) ×1
DRAPE INCISE IOBAN 66X45 STRL (DRAPES) ×3 IMPLANT
DRAPE SRG 135X102X78XABS (DRAPES) ×2 IMPLANT
DRAPE WARM FLUID 44X44 (DRAPES) ×3 IMPLANT
DRSG AQUACEL AG ADV 3.5X14 (GAUZE/BANDAGES/DRESSINGS) ×3 IMPLANT
DRSG COVADERM 4X8 (GAUZE/BANDAGES/DRESSINGS) ×3 IMPLANT
ELECT BLADE 4.0 EZ CLEAN MEGAD (MISCELLANEOUS) ×3
ELECT REM PT RETURN 9FT ADLT (ELECTROSURGICAL) ×6
ELECTRODE BLDE 4.0 EZ CLN MEGD (MISCELLANEOUS) ×2 IMPLANT
ELECTRODE REM PT RTRN 9FT ADLT (ELECTROSURGICAL) ×4 IMPLANT
FELT TEFLON 1X6 (MISCELLANEOUS) ×3 IMPLANT
FIBERTAPE STERNAL CLSR 2 36IN (SUTURE) ×12 IMPLANT
FIBERTAPE STERNAL CLSR 2X36 (SUTURE) ×12 IMPLANT
GAUZE SPONGE 4X4 12PLY STRL (GAUZE/BANDAGES/DRESSINGS) ×6 IMPLANT
GLOVE ORTHO TXT STRL SZ7.5 (GLOVE) ×6 IMPLANT
GOWN STRL REUS W/ TWL LRG LVL3 (GOWN DISPOSABLE) ×8 IMPLANT
GOWN STRL REUS W/TWL LRG LVL3 (GOWN DISPOSABLE) ×4
HEMOSTAT POWDER SURGIFOAM 1G (HEMOSTASIS) ×15 IMPLANT
INSERT FOGARTY XLG (MISCELLANEOUS) ×3 IMPLANT
KIT BASIN OR (CUSTOM PROCEDURE TRAY) ×3 IMPLANT
KIT SUCTION CATH 14FR (SUCTIONS) ×9 IMPLANT
KIT TURNOVER KIT B (KITS) ×3 IMPLANT
KIT VASOVIEW HEMOPRO 2 VH 4000 (KITS) ×3 IMPLANT
LEAD PACING MYOCARDI (MISCELLANEOUS) ×3 IMPLANT
MARKER GRAFT CORONARY BYPASS (MISCELLANEOUS) ×9 IMPLANT
NDL SUT PASSING CERCLAGE MED (SUTURE) ×6
NEEDLE SUT PASSING CERCLAG MED (SUTURE) ×4 IMPLANT
NS IRRIG 1000ML POUR BTL (IV SOLUTION) ×15 IMPLANT
PACK E OPEN HEART (SUTURE) ×3 IMPLANT
PACK OPEN HEART (CUSTOM PROCEDURE TRAY) ×3 IMPLANT
PAD ARMBOARD 7.5X6 YLW CONV (MISCELLANEOUS) ×6 IMPLANT
PAD ELECT DEFIB RADIOL ZOLL (MISCELLANEOUS) ×3 IMPLANT
PENCIL BUTTON HOLSTER BLD 10FT (ELECTRODE) ×3 IMPLANT
POSITIONER HEAD DONUT 9IN (MISCELLANEOUS) ×3 IMPLANT
PUNCH AORTIC ROTATE 4.0MM (MISCELLANEOUS) IMPLANT
PUNCH AORTIC ROTATE 4.5MM 8IN (MISCELLANEOUS) ×3 IMPLANT
PUNCH AORTIC ROTATE 5MM 8IN (MISCELLANEOUS) IMPLANT
SET MPS 3-ND DEL (MISCELLANEOUS) ×3 IMPLANT
SOL ANTI FOG 6CC (MISCELLANEOUS) ×2 IMPLANT
SOLUTION ANTI FOG 6CC (MISCELLANEOUS) ×1
SPONGE LAP 18X18 RF (DISPOSABLE) ×3 IMPLANT
SPONGE LAP 18X18 X RAY DECT (DISPOSABLE) ×3 IMPLANT
SPONGE LAP 4X18 RFD (DISPOSABLE) ×6 IMPLANT
SUPPORT HEART JANKE-BARRON (MISCELLANEOUS) ×3 IMPLANT
SUT BONE WAX W31G (SUTURE) ×3 IMPLANT
SUT ETHIBOND X763 2 0 SH 1 (SUTURE) ×6 IMPLANT
SUT MNCRL AB 3-0 PS2 18 (SUTURE) ×6 IMPLANT
SUT MNCRL AB 4-0 PS2 18 (SUTURE) ×3 IMPLANT
SUT PDS AB 1 CTX 36 (SUTURE) ×6 IMPLANT
SUT PROLENE 2 0 SH DA (SUTURE) IMPLANT
SUT PROLENE 3 0 SH DA (SUTURE) ×3 IMPLANT
SUT PROLENE 3 0 SH1 36 (SUTURE) ×9 IMPLANT
SUT PROLENE 4 0 RB 1 (SUTURE) ×5
SUT PROLENE 4 0 SH DA (SUTURE) IMPLANT
SUT PROLENE 4-0 RB1 .5 CRCL 36 (SUTURE) ×10 IMPLANT
SUT PROLENE 5 0 C 1 36 (SUTURE) IMPLANT
SUT PROLENE 6 0 C 1 30 (SUTURE) IMPLANT
SUT PROLENE 7 0 BV 1 (SUTURE) ×3 IMPLANT
SUT PROLENE 7.0 RB 3 (SUTURE) ×18 IMPLANT
SUT PROLENE 8 0 BV175 6 (SUTURE) IMPLANT
SUT PROLENE BLUE 7 0 (SUTURE) ×3 IMPLANT
SUT PROLENE POLY MONO (SUTURE) ×21 IMPLANT
SUT SILK  1 MH (SUTURE) ×2
SUT SILK 1 MH (SUTURE) ×4 IMPLANT
SUT STEEL 6MS V (SUTURE) IMPLANT
SUT STEEL STERNAL CCS#1 18IN (SUTURE) IMPLANT
SUT STEEL SZ 6 DBL 3X14 BALL (SUTURE) IMPLANT
SUT VIC AB 1 CTX 36 (SUTURE) ×1
SUT VIC AB 1 CTX36XBRD ANBCTR (SUTURE) ×2 IMPLANT
SUT VIC AB 2-0 CT1 27 (SUTURE) ×1
SUT VIC AB 2-0 CT1 TAPERPNT 27 (SUTURE) ×2 IMPLANT
SUT VIC AB 2-0 CTX 27 (SUTURE) IMPLANT
SUT VIC AB 3-0 SH 27 (SUTURE)
SUT VIC AB 3-0 SH 27X BRD (SUTURE) IMPLANT
SUT VIC AB 3-0 X1 27 (SUTURE) IMPLANT
SUT VICRYL 4-0 PS2 18IN ABS (SUTURE) IMPLANT
SYSTEM SAHARA CHEST DRAIN ATS (WOUND CARE) ×3 IMPLANT
TAPE PAPER 2X10 WHT MICROPORE (GAUZE/BANDAGES/DRESSINGS) ×3 IMPLANT
TOWEL GREEN STERILE (TOWEL DISPOSABLE) ×3 IMPLANT
TOWEL GREEN STERILE FF (TOWEL DISPOSABLE) ×3 IMPLANT
TRAY FOLEY SLVR 16FR TEMP STAT (SET/KITS/TRAYS/PACK) ×3 IMPLANT
TUBE SUCT INTRACARD DLP 20F (MISCELLANEOUS) ×3 IMPLANT
TUBING LAP HI FLOW INSUFFLATIO (TUBING) ×3 IMPLANT
UNDERPAD 30X36 HEAVY ABSORB (UNDERPADS AND DIAPERS) ×3 IMPLANT
WATER STERILE IRR 1000ML POUR (IV SOLUTION) ×6 IMPLANT

## 2020-06-02 NOTE — Hospital Course (Addendum)
History of Present Illness:  Patient is a 70 year old male with no previous history of coronary artery disease but risk factors notable for history of hypertension, hyperlipidemia, and remote history of tobacco use who presented with a several day history of classical symptoms of unstable angina, had weakly positive troponin levels consistent with non-ST segment elevation myocardial infarction. 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  He did not require adjuvant chemotherapy or radiation therapy and he has done well ever since.  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    Patient was in his usual state of health until recently when he underwent surgical repair of left rotator cuff injury several months ago.  He has been going through rehab since then.  4 days ago patient was doing some exercises with a dumbbell and developed burning substernal chest pain radiating across his left chest to the left shoulder and to his neck.  He initially blamed it on his recent shoulder surgery and symptoms resolved with rest.  The following day he was lifting something heavy and similar symptoms occurred and subsided with rest.  Later that night he had a prolonged episode of severe substernal chest pain that occurred at rest, prompting him to go to the emergency department in [place].  There by report Baseline EKG was unremarkable but serial troponin levels were positive.  The patient left the hospital AGAINST MEDICAL ADVICE and the following day went to [facility] for further evaluation.  The patient was transferred to [facility] where he underwent diagnostic cardiac catheterization by Dr. 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  Left ventricular function  is preserved.  Cardiothoracic surgical consultation was requested.  He was evaluated by Dr. Roxy Manns who felt the patient would benefit from coronary bypass grafting procedure.  The risks and benefits of the procedure and he was agreeable to proceed.  Hospital Course:  He remained chest pain free prior to surgery.  He was taken to the operating room and underwent CABG x 3 utilizing LIMA to LAD, SVG to OM, and SVG to Distal RCA.  He also underwent endoscopic harvest of greater saphenous vein from his right thigh to below the knee.  He tolerated the procedure without difficulty and was taken to the SICU in stable condition. He was extubated the evening of surgery without difficulty. He was started on Lopressor, ec asa, and a statin. He was weaned off the Insulin drip. His pre op HGA1C is 6.2. 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 Nutritional information will be provided with his discharge paperwork. He will need further surveillance by his medical doctor after discharge. He had expected post op blood loss anemia. He did not require a post op transfusion. Last H and H was ***. He was volume overloaded and diuresed. He was felt surgically stable for transfer from the ICU to 4E for further convalescence on 06/04/2020. He had nausea and vomiting on 05/30. He was given IVF, diuresis held, given Zofran and Reglan, and narcotics were limited as well. He was still requiring several liters of oxygen via [place] post op.  CXR was obtained and showed a moderate left sided pleural effusion.  He was referred for thoracentesis.  This was performed on 06/06/2020 and removed 776ml of fluid.  Following this, the CXR was satisfactory and  he was quickly weaned from the supplemental O2. His wounds are clean, dry, and healing without signs of infection.

## 2020-06-02 NOTE — Anesthesia Postprocedure Evaluation (Signed)
Anesthesia Post Note  Patient: James Wolf  Procedure(s) Performed: CORONARY ARTERY BYPASS GRAFTING (CABG) TIMES THREE USING RIGHT GREATER SAPHENOUS VEIN HARVESTED ENDOSCOPICALLY AND LEFT INTERNAL MAMMARY ARTERY. (N/A Chest)     Patient location during evaluation: SICU Anesthesia Type: General Level of consciousness: sedated and patient remains intubated per anesthesia plan Pain management: pain level controlled Vital Signs Assessment: post-procedure vital signs reviewed and stable Respiratory status: patient remains intubated per anesthesia plan and patient on ventilator - see flowsheet for VS Cardiovascular status: continues on low dose Phenylephrine infusion. Postop Assessment: no apparent nausea or vomiting Anesthetic complications: no Comments: Awaiting FFP transfusion, very stable   No complications documented.  Last Vitals:  Vitals:   06/02/20 0600 06/02/20 1325  BP: 128/81 (!) 78/50  Pulse: 64 80  Resp: (!) 27 12  Temp:    SpO2: 92% 99%    Last Pain:  Vitals:   06/02/20 0400  TempSrc: Oral  PainSc: 0-No pain                 Ronie Fleeger,E. Lisel Siegrist

## 2020-06-02 NOTE — Brief Op Note (Signed)
05/29/2020 - 06/02/2020  11:03 AM  PATIENT:  James Wolf  70 y.o. male  PRE-OPERATIVE DIAGNOSIS:  CAD  POST-OPERATIVE DIAGNOSIS:  CAD  PROCEDURE:  Procedure(s):  CORONARY ARTERY BYPASS GRAFTING x 3 -LIMA to LAD -SVG to OM -SVG to DISTAL RCA  ENDOSCOPIC HARVEST GREATER SAPHENOUS VEIN -Right Thigh to Below the Knee  TRANSESOPHAGEAL ECHOCARDIOGRAM  SURGEON:  Surgeon(s) and Role:    Rexene Alberts, MD - Primary  PHYSICIAN ASSISTANT: Ellwood Handler PA-C  ASSISTANTS: Despina Arias RNFA   ANESTHESIA:   general  EBL:  358 mL  BLOOD ADMINISTERED: CELLSAVER  DRAINS: Left Pleural Chest Tube, Mediastinal Chest Drain   LOCAL MEDICATIONS USED:  NONE  SPECIMEN:  No Specimen  DISPOSITION OF SPECIMEN:  N/A  COUNTS:  YES  TOURNIQUET:  * No tourniquets in log *  DICTATION: .Dragon Dictation  PLAN OF CARE: Admit to inpatient   PATIENT DISPOSITION:  ICU - intubated and hemodynamically stable.   Delay start of Pharmacological VTE agent (>24hrs) due to surgical blood loss or risk of bleeding: yes

## 2020-06-02 NOTE — Progress Notes (Signed)
      [address]       [place] [address]             [phone]      S/p CABG x 3  Intubated, waking up, wean in progress  BP 117/66   Pulse 89   Temp 98.06 F (36.7 C)   Resp (!) 22   Wt 76 kg   SpO2 100%   BMI 27.46 kg/m  30/17, CI =2.0   Intake/Output Summary (Last 24 hours) at 06/02/2020 1726 Last data filed at 06/02/2020 1700 Gross per 24 hour  Intake 6235.5 ml  Output 6274 ml  Net -38.5 ml   Minimal CT output  Doing well early postop  Remo Lipps C. Roxan Hockey, MD [facility] [phone]

## 2020-06-02 NOTE — Progress Notes (Signed)
      [address]       [place] [address]             [phone]     CARDIOTHORACIC SURGERY PROGRESS NOTE  Subjective: James Wolf has been scheduled for CORONARY ARTERY BYPASS GRAFTING today.   Objective: Vital signs in last 24 hours: Temp:  [97.7 F (36.5 C)-98.6 F (37 C)] 98.5 F (36.9 C) (05/27 0400) Pulse Rate:  [59-89] 67 (05/27 0444) Cardiac Rhythm: Normal sinus rhythm (05/27 0400) Resp:  [11-29] 18 (05/27 0400) BP: (119-155)/(74-91) 135/75 (05/27 0444) SpO2:  [91 %-100 %] 94 % (05/27 0400) Weight:  [76 kg] 76 kg (05/27 0400)  Physical Exam: Unchanged from previously   Intake/Output from previous day: 05/26 0701 - 05/27 0700 In: 1112.7 [P.O.:320; I.V.:393.2; IV Piggyback:399.6] Out: 2230 [Urine:2230] Intake/Output this shift: Total I/O In: 709.7 [P.O.:320; I.V.:189.9; IV Piggyback:199.8] Out: 1200 [Urine:1200]  Lab Results: Recent Labs    06/01/20 0034 06/01/20 1152 06/02/20 0321  WBC 10.0  --  8.2  HGB 12.2* 11.6* 11.9*  HCT 37.0* 34.0* 35.5*  PLT 275  --  290   BMET:  Recent Labs    06/01/20 1222 06/02/20 0321  NA 137 136  K 3.7 3.8  CL 104 102  CO2 25 26  GLUCOSE 119* 101*  BUN 11 14  CREATININE 1.17 1.23  CALCIUM 8.8* 8.9    CBG (last 3)  No results for input(s): GLUCAP in the last 72 hours. PT/INR:   Recent Labs    06/01/20 1222  LABPROT 12.9  INR 1.0    Assessment/Plan:   The various methods of treatment have been discussed with the patient. After consideration of the risks, benefits and treatment options the patient has consented to the planned procedure.   The patient has been seen and labs reviewed. There are no changes in the patient's condition to prevent proceeding with the planned procedure today.   Rexene Alberts, MD 06/02/2020 4:59 AM

## 2020-06-02 NOTE — Transfer of Care (Signed)
Immediate Anesthesia Transfer of Care Note  Patient: TANIS BURNLEY  Procedure(s) Performed: CORONARY ARTERY BYPASS GRAFTING (CABG) TIMES THREE USING RIGHT GREATER SAPHENOUS VEIN HARVESTED ENDOSCOPICALLY AND LEFT INTERNAL MAMMARY ARTERY. (N/A Chest)  Patient Location: SICU  Anesthesia Type:General  Level of Consciousness: Patient remains intubated per anesthesia plan  Airway & Oxygen Therapy: Patient remains intubated per anesthesia plan  Post-op Assessment: Report given to RN and Post -op Vital signs reviewed and stable  Post vital signs: Reviewed and stable  Last Vitals:  Vitals Value Taken Time  BP 85/62 06/02/20 1333  Temp 35.6 C 06/02/20 1341  Pulse 89 06/02/20 1341  Resp 19 06/02/20 1341  SpO2 98 % 06/02/20 1341  Vitals shown include unvalidated device data.  Last Pain:  Vitals:   06/02/20 0400  TempSrc: Oral  PainSc: 0-No pain      Patients Stated Pain Goal: 0 (87/21/58 7276)  Complications: No complications documented.

## 2020-06-02 NOTE — Op Note (Signed)
CARDIOTHORACIC SURGERY OPERATIVE NOTE  Date of Procedure: 06/02/2020  Preoperative Diagnosis:   Severe 3-vessel Coronary Artery Disease  Unstable Angina  Postoperative Diagnosis: Same  Procedure:    Coronary Artery Bypass Grafting x 3   Left Internal Mammary Artery to Distal Left Anterior Descending Coronary Artery  Saphenous Vein Graft to Distal Right Coronary Artery  Saphenous Vein Graft to Obtuse Marginal Branch of Left Circumflex Coronary Artery  Endoscopic Vein Harvest from Right Thigh and Lower Leg  Surgeon: Valentina Gu. Roxy Manns, MD  Assistant: Ellwood Handler, PA-C  Anesthesia: Midge Minium, MD  Operative Findings:  Normal left ventricular systolic function  Good quality left internal mammary artery conduit  Good quality saphenous vein conduit  Good quality target vessels for grafting    BRIEF CLINICAL NOTE AND INDICATIONS FOR SURGERY  Patient is a 70 year old male with no previous history of coronary artery disease but risk factors notable for history of hypertension, hyperlipidemia, and remote history of tobacco use who presented with a several day history of classical symptoms of unstable angina, had weakly positive troponin levels consistent with non-ST segment elevation myocardial infarction and has been referred for surgical consultation to discuss treatment options for management of 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  He did not require adjuvant chemotherapy or radiation therapy and he has done well ever since.  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  Patient was in his usual state of health until recently when he underwent surgical repair of left rotator cuff injury several months ago.  He has been going through rehab since then.  4 days ago patient was doing some exercises with a dumbbell and developed burning substernal chest pain radiating across his left chest to the left shoulder and to his neck.  He initially blamed it on his recent shoulder surgery and symptoms resolved with rest.  The following day he was lifting something heavy and similar symptoms occurred and subsided with rest.  Later that night he had a prolonged episode of severe substernal chest pain that occurred at rest, prompting him to go to the emergency department in [place].  There by report Baseline EKG was unremarkable but serial troponin levels were positive.  The patient left the hospital AGAINST MEDICAL ADVICE and the following day went to [facility] for further evaluation.  The patient was transferred to [facility] where he underwent diagnostic cardiac catheterization by Dr. 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  Left ventricular function is preserved.  Cardiothoracic surgical consultation was requested.    The patient has been seen in consultation and counseled at length regarding the indications, risks and potential benefits of surgery.  All questions have been answered, and the patient provides full informed consent for the operation as described.    DETAILS OF THE OPERATIVE PROCEDURE  Preparation:  The patient is brought to the operating room on the above mentioned date and central monitoring was established by the anesthesia team including placement of Swan-Ganz  catheter and radial arterial line. The patient is placed in the supine position on the operating table.   Intravenous antibiotics are administered. General endotracheal anesthesia is induced uneventfully. A Foley catheter is placed.  The patient's chest, abdomen, both groins, and both lower extremities are prepared and draped in a sterile manner. A time out procedure is performed.   Surgical Approach and Conduit Harvest:  A median sternotomy incision was performed and the left internal mammary artery is dissected from the chest wall and prepared for bypass grafting. The left internal mammary artery is notably good quality conduit. Simultaneously, the greater saphenous vein is obtained from the patient's right thigh and leg using endoscopic vein harvest technique. The saphenous vein is notably good quality conduit. After removal of the saphenous vein, the small surgical incisions in the lower extremity are closed with absorbable suture. Following systemic heparinization, the left internal mammary artery was transected distally noted to have excellent flow.   Extracorporeal Cardiopulmonary Bypass and Myocardial Protection:  The pericardium is opened. The ascending aorta is normal in appearance. The ascending aorta and the right atrium are cannulated for cardiopulmonary bypass.  Adequate heparinization is verified.     The entire pre-bypass portion of the operation was notable for stable hemodynamics.  Cardiopulmonary bypass was begun and the surface of the heart is inspected. Distal target vessels are selected for coronary artery bypass grafting. A cardioplegia cannula is placed in the ascending aorta.  A temperature probe was placed in the interventricular septum.  The patient is allowed to cool passively to [facility] systemic temperature.  The aortic cross clamp is applied and cold blood cardioplegia is delivered initially in an antegrade fashion through the aortic root.  Iced saline slush is applied for topical hypothermia.  The initial cardioplegic arrest is rapid with early diastolic arrest.  Repeat  doses of cardioplegia are administered intermittently throughout the entire cross clamp portion of the operation through the aortic root and through subsequently placed vein grafts in order to maintain completely flat electrocardiogram and septal myocardial temperature below 15C.  Myocardial protection was felt to be excellent.   Coronary Artery Bypass Grafting:   The distal right coronary artery was grafted using a reversed saphenous vein graft in an end-to-side fashion.  At the site of distal anastomosis the target vessel was good quality and measured approximately 2.2 mm in diameter.  The obtuse marginal branch of the left circumflex coronary artery was grafted using a reversed saphenous vein graft in an end-to-side fashion.  At the site of distal anastomosis the target vessel was fair quality and measured approximately 1.8 mm in diameter.  The distal left anterior coronary artery was grafted with the left internal mammary artery in an end-to-side fashion.  At the site of distal anastomosis the target vessel was good quality and measured approximately 2.0 mm in diameter.  All proximal vein graft anastomoses were placed directly to the ascending aorta prior to removal of the aortic cross clamp.  The septal myocardial temperature rose rapidly after reperfusion of the left internal mammary artery graft.  The aortic cross clamp was removed after a total cross clamp time of 65 minutes.   Procedure Completion:  All proximal and distal coronary anastomoses were inspected for hemostasis and appropriate graft orientation.  Some bleeding was noted from the distal anastomosis of the obtuse marginal branch.  This was difficult to expose safely for repair.  Subsequently the aortic cross-clamp was replaced and cardioplegia delivered in antegrade fashion through the aortic root.  A bulldog is applied to the left internal mammary artery graft.  The distal anastomosis of the obtuse marginal branch then repaired  while the heart was arrested using 2 separate interrupted 7-0 Prolene stitches.  Left internal mammary artery graft is reopened and the aortic cross-clamp removed after an additional cross-clamp time of 60 minutes such that the grand total cross-clamp time for the operation 71 minutes.  All proximal and distal anastomoses are again inspected for hemostasis.    Epicardial pacing wires are fixed to the right ventricular outflow tract and to the right atrial appendage. The patient is rewarmed to 37C temperature. The patient is weaned and disconnected from cardiopulmonary bypass.  The patient's rhythm at separation from bypass was sinus.  The patient was weaned from cardiopulmonary bypass without any inotropic support. Total cardiopulmonary bypass time for the operation was 108 minutes.  Followup transesophageal echocardiogram performed after separation from bypass revealed no changes from the preoperative exam.  The aortic and venous cannula were removed uneventfully. Protamine was administered to reverse the anticoagulation. The mediastinum and pleural space were inspected for hemostasis and irrigated with saline solution. The mediastinum and the left pleural space were drained using 3 chest tubes placed through separate stab incisions inferiorly.  The soft tissues anterior to the aorta were reapproximated loosely. The sternum is closed with Fibertape cerclage. The soft tissues anterior to the sternum were closed in multiple layers and the skin is closed with a running subcuticular skin closure.  The post-bypass portion of the operation was notable for stable rhythm and hemodynamics.  No blood products were administered during the operation.   Disposition:  The patient tolerated the procedure well and is transported to the surgical intensive care in stable condition. There are no intraoperative complications. All sponge instrument and needle counts are verified correct at completion of the  operation.    Valentina Gu. Roxy Manns MD 06/02/2020 1:19 PM

## 2020-06-02 NOTE — Anesthesia Procedure Notes (Signed)
Procedure Name: Intubation Date/Time: 06/02/2020 8:06 AM Performed by: Clearnce Sorrel, CRNA Pre-anesthesia Checklist: Patient identified, Emergency Drugs available, Suction available, Patient being monitored and Timeout performed Patient Re-evaluated:Patient Re-evaluated prior to induction Oxygen Delivery Method: Circle system utilized Preoxygenation: Pre-oxygenation with 100% oxygen Induction Type: IV induction Ventilation: Mask ventilation without difficulty and Oral airway inserted - appropriate to patient size Laryngoscope Size: Glidescope and 3 (expected difficult airway) Grade View: Grade I Tube type: Oral Tube size: 8.0 mm Number of attempts: 1 Airway Equipment and Method: Stylet Placement Confirmation: ETT inserted through vocal cords under direct vision,  positive ETCO2 and breath sounds checked- equal and bilateral Secured at: 23 cm Tube secured with: Tape Dental Injury: Teeth and Oropharynx as per pre-operative assessment

## 2020-06-02 NOTE — Anesthesia Procedure Notes (Signed)
Arterial Line Insertion Start/End5/27/2022 6:50 AM, 06/02/2020 6:56 AM Performed by: Inda Coke, CRNA, CRNA  Preanesthetic checklist: patient identified, IV checked, site marked, risks and benefits discussed, surgical consent, monitors and equipment checked, pre-op evaluation, timeout performed and anesthesia consent Lidocaine 1% used for infiltration and patient sedated Left, radial was placed Catheter size: 20 G Hand hygiene performed  and maximum sterile barriers used  Allen's test indicative of satisfactory collateral circulation Attempts: 1 Procedure performed without using ultrasound guided technique. Following insertion, dressing applied and Biopatch. Post procedure assessment: normal  Patient tolerated the procedure well with no immediate complications.

## 2020-06-02 NOTE — Progress Notes (Signed)
Patient transferred via stretcher with this nurse and pre- op tech.  VS stable, RA. No s/s of distress/discomfot. Patient denies pain.  Pt's wife and sister guided to surgery waiting room.  Pt placed on pre-op monitor. Oriented to room.  Report called to Clearnce Sorrel, CCRN at this time per request.

## 2020-06-02 NOTE — Procedures (Signed)
Extubation Procedure Note  Patient Details:   Name: James Wolf DOB: 1950-07-02 MRN: [id number]   Airway Documentation:    Vent end date: 06/02/20 Vent end time: 1800   Evaluation  O2 sats: transiently fell during during procedure Complications: No apparent complications Patient did tolerate procedure well. Bilateral Breath Sounds: Coarse crackles   Yes   Positive cuff leak noted, NIF - 30, VC 500 mL. Patient placed on [place] 4L with humidity, no stridor noted.  Patient does have coarse crackles.  Patient's sats dropped to 87%, patient shallow breathing.  O2 increased to 6L, patient asked to take deep breathes through his nose. Sats improved top 100%.  Patient able to reach 250 mL using the incentive spirometer.  Bayard Beaver 06/02/2020, 6:16 PM

## 2020-06-02 NOTE — Anesthesia Procedure Notes (Signed)
Central Venous Catheter Insertion Performed by: Annye Asa, MD, anesthesiologist Start/End5/27/2022 6:58 AM, 06/02/2020 7:09 AM Patient location: Pre-op. Preanesthetic checklist: patient identified, IV checked, risks and benefits discussed, surgical consent, monitors and equipment checked, pre-op evaluation, timeout performed and anesthesia consent Position: supine Lidocaine 1% used for infiltration and patient sedated Hand hygiene performed , maximum sterile barriers used  and Seldinger technique used Catheter size: 8.5 Fr PA cath was placed.Sheath introducer Swan type:thermodilution Procedure performed using ultrasound guided technique. Ultrasound Notes:anatomy identified, needle tip was noted to be adjacent to the nerve/plexus identified, no ultrasound evidence of intravascular and/or intraneural injection and image(s) printed for medical record Attempts: 1 Following insertion, line sutured, dressing applied and Biopatch. Post procedure assessment: blood return through all ports, free fluid flow and no air  Patient tolerated the procedure well with no immediate complications. Additional procedure comments: PA catheter:  Routine monitors. Timeout, sterile prep, drape, FBP R neck.  Supine position.  1% Lido local, finder and trocar RIJ 1st pass with US guidance.  Cordis placed over J wire. PA catheter in easily.  Sterile dressing applied.  Patient tolerated well, VSS.  Jenita Seashore, MD.

## 2020-06-03 ENCOUNTER — Inpatient Hospital Stay (HOSPITAL_COMMUNITY): Payer: BC Managed Care – PPO

## 2020-06-03 DIAGNOSIS — E785 Hyperlipidemia, unspecified: Secondary | ICD-10-CM | POA: Diagnosis not present

## 2020-06-03 DIAGNOSIS — Z951 Presence of aortocoronary bypass graft: Secondary | ICD-10-CM

## 2020-06-03 DIAGNOSIS — I214 Non-ST elevation (NSTEMI) myocardial infarction: Secondary | ICD-10-CM | POA: Diagnosis not present

## 2020-06-03 LAB — TYPE AND SCREEN
ABO/RH(D): B POS
Antibody Screen: NEGATIVE
Unit division: 0

## 2020-06-03 LAB — BPAM FFP
Blood Product Expiration Date: 202206012359
Blood Product Expiration Date: 202206012359
ISSUE DATE / TIME: 202205271306
ISSUE DATE / TIME: 202205271306
Unit Type and Rh: 1700
Unit Type and Rh: 7300

## 2020-06-03 LAB — MAGNESIUM
Magnesium: 2.7 mg/dL — ABNORMAL HIGH (ref 1.7–2.4)
Magnesium: 2.4 mg/dL (ref 1.7–2.4)

## 2020-06-03 LAB — CBC
HCT: 23.8 % — ABNORMAL LOW (ref 39.0–52.0)
HCT: 26.2 % — ABNORMAL LOW (ref 39.0–52.0)
Hemoglobin: 7.8 g/dL — ABNORMAL LOW (ref 13.0–17.0)
Hemoglobin: 8.4 g/dL — ABNORMAL LOW (ref 13.0–17.0)
MCH: 29.7 pg (ref 26.0–34.0)
MCH: 29.2 pg (ref 26.0–34.0)
MCHC: 32.8 g/dL (ref 30.0–36.0)
MCHC: 32.1 g/dL (ref 30.0–36.0)
MCV: 90.5 fL (ref 80.0–100.0)
MCV: 91 fL (ref 80.0–100.0)
Platelets: 131 10*3/uL — ABNORMAL LOW (ref 150–400)
Platelets: 151 10*3/uL (ref 150–400)
RBC: 2.63 MIL/uL — ABNORMAL LOW (ref 4.22–5.81)
RBC: 2.88 MIL/uL — ABNORMAL LOW (ref 4.22–5.81)
RDW: 14.4 % (ref 11.5–15.5)
RDW: 14.6 % (ref 11.5–15.5)
WBC: 8.5 10*3/uL (ref 4.0–10.5)
WBC: 13.3 10*3/uL — ABNORMAL HIGH (ref 4.0–10.5)
nRBC: 0 % (ref 0.0–0.2)
nRBC: 0 % (ref 0.0–0.2)

## 2020-06-03 LAB — GLUCOSE, CAPILLARY
Glucose-Capillary: 110 mg/dL — ABNORMAL HIGH (ref 70–99)
Glucose-Capillary: 118 mg/dL — ABNORMAL HIGH (ref 70–99)
Glucose-Capillary: 126 mg/dL — ABNORMAL HIGH (ref 70–99)
Glucose-Capillary: 126 mg/dL — ABNORMAL HIGH (ref 70–99)
Glucose-Capillary: 127 mg/dL — ABNORMAL HIGH (ref 70–99)
Glucose-Capillary: 130 mg/dL — ABNORMAL HIGH (ref 70–99)
Glucose-Capillary: 130 mg/dL — ABNORMAL HIGH (ref 70–99)
Glucose-Capillary: 149 mg/dL — ABNORMAL HIGH (ref 70–99)
Glucose-Capillary: 99 mg/dL (ref 70–99)

## 2020-06-03 LAB — BASIC METABOLIC PANEL
Anion gap: 10 (ref 5–15)
Anion gap: 9 (ref 5–15)
BUN: 10 mg/dL (ref 8–23)
BUN: 12 mg/dL (ref 8–23)
CO2: 22 mmol/L (ref 22–32)
CO2: 26 mmol/L (ref 22–32)
Calcium: 7 mg/dL — ABNORMAL LOW (ref 8.9–10.3)
Calcium: 7.5 mg/dL — ABNORMAL LOW (ref 8.9–10.3)
Chloride: 105 mmol/L (ref 98–111)
Chloride: 102 mmol/L (ref 98–111)
Creatinine, Ser: 1.19 mg/dL (ref 0.61–1.24)
Creatinine, Ser: 1.36 mg/dL — ABNORMAL HIGH (ref 0.61–1.24)
GFR, Estimated: >60 mL/min (ref >60)
GFR, Estimated: 57 mL/min — ABNORMAL LOW (ref >60)
Glucose, Bld: 117 mg/dL — ABNORMAL HIGH (ref 70–99)
Glucose, Bld: 134 mg/dL — ABNORMAL HIGH (ref 70–99)
Potassium: 4.3 mmol/L (ref 3.5–5.1)
Potassium: 4 mmol/L (ref 3.5–5.1)
Sodium: 137 mmol/L (ref 135–145)
Sodium: 137 mmol/L (ref 135–145)

## 2020-06-03 LAB — PREPARE FRESH FROZEN PLASMA
Unit division: 0
Unit division: 0

## 2020-06-03 LAB — BPAM RBC
Blood Product Expiration Date: 202206212359
ISSUE DATE / TIME: 202205271845
Unit Type and Rh: 7300

## 2020-06-03 IMAGING — DX DG CHEST 1V PORT
1 series · 1 of 1 positions shown · non-contrast
Comparison: [DATE]

CLINICAL DATA: Status post CABG

EXAM:
PORTABLE CHEST 1 VIEW

[chest]
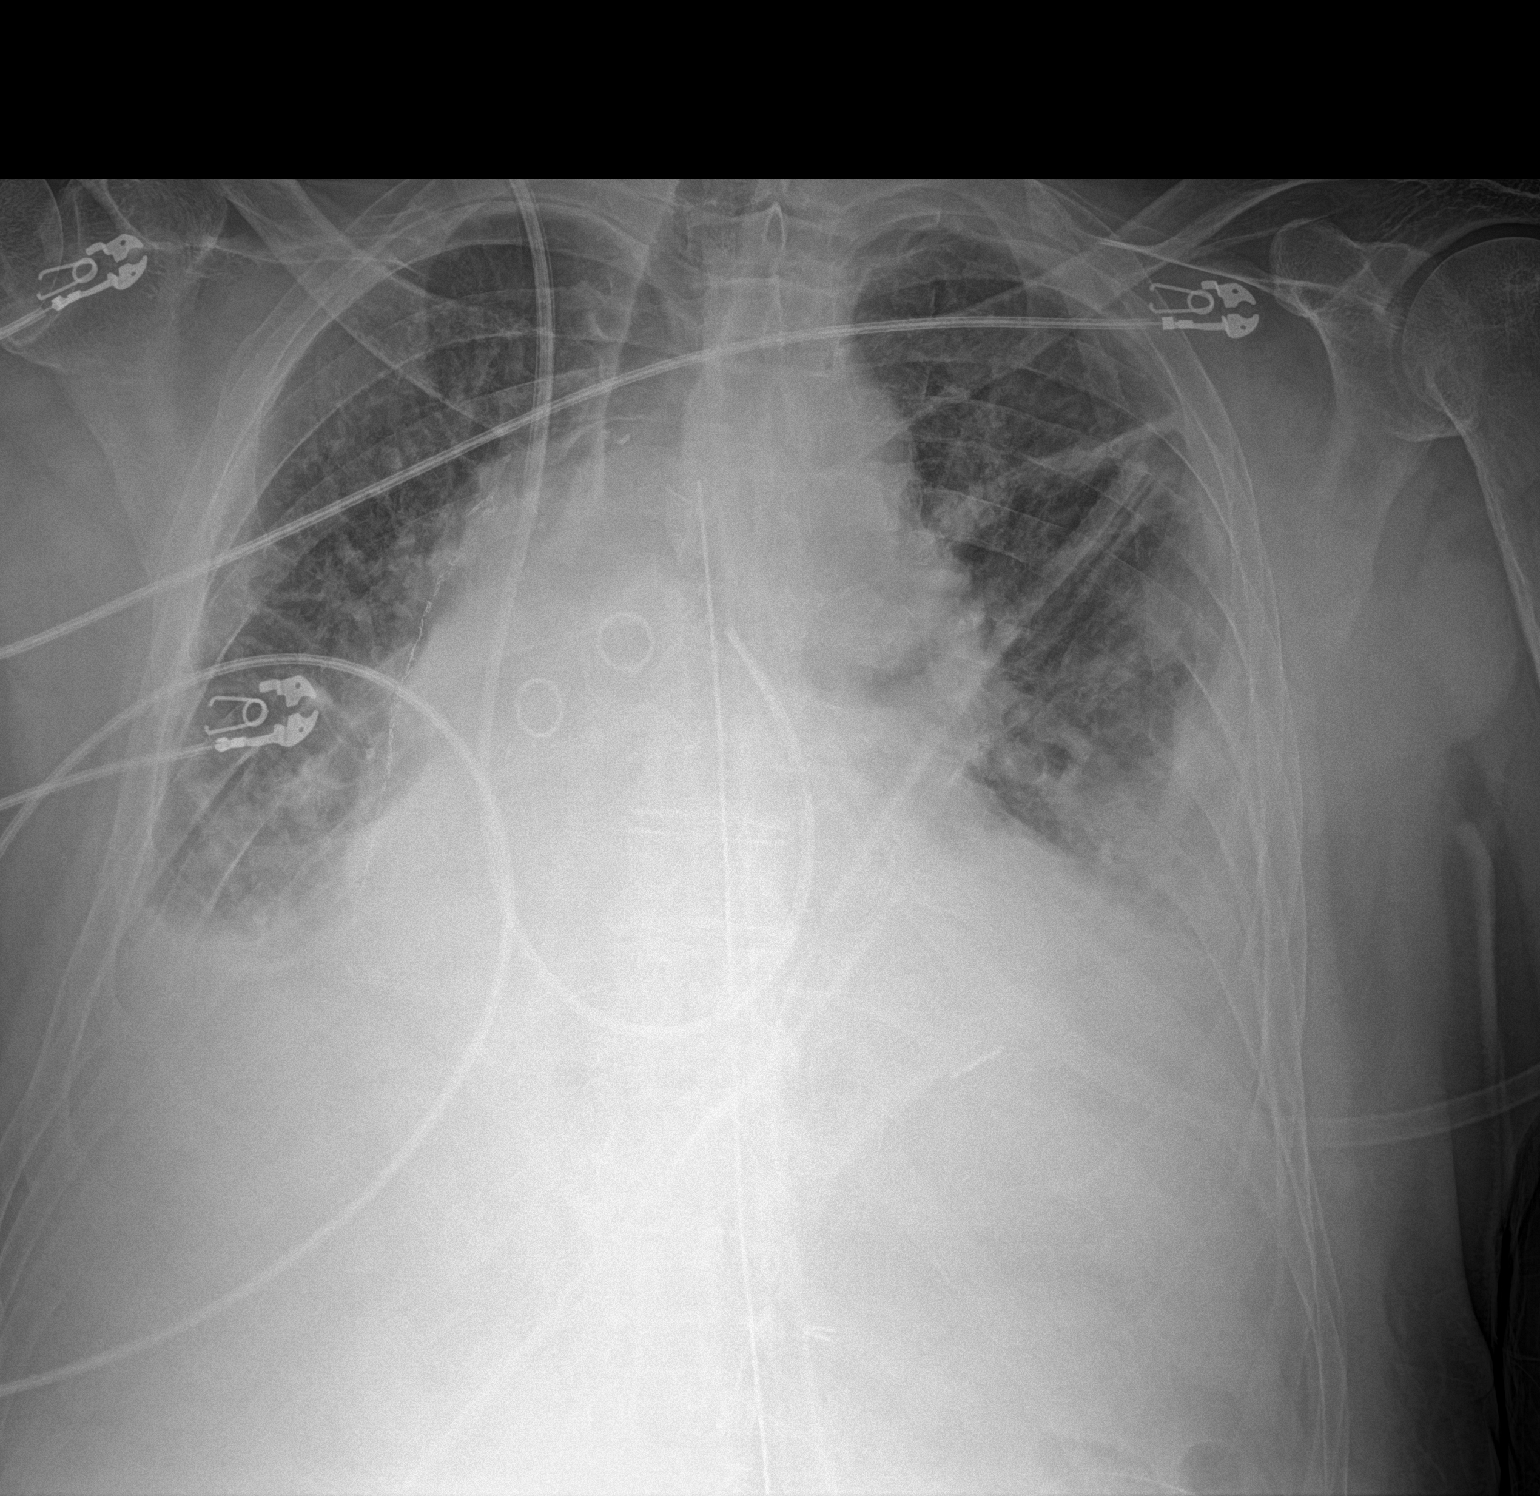

[1 of 1 positions shown; findings below may reference images not displayed]

FINDINGS: Interval extubation. Stable right IJ Swan-Ganz catheter. Stable left
chest tube and mediastinal drain.

Platelike scarring in the left upper lobe. Small left pleural
effusion, mildly increased. Trace right pleural effusion. Associated
bilateral lower lobe opacities, likely atelectasis.

Pulmonary vascular congestion without frank interstitial edema.
Postsurgical changes in the right hemithorax.

Cardiomegaly.  Postsurgical changes related to prior CABG.
IMPRESSION: Interval extubation.  Additional support apparatus as above.

No pneumothorax.

Small bilateral pleural effusions, left greater than right, mildly
increased. Associated lower lobe opacities, likely atelectasis.

## 2020-06-03 MED ORDER — ENOXAPARIN SODIUM 40 MG/0.4ML IJ SOSY
40.0000 mg | PREFILLED_SYRINGE | Freq: Every day | INTRAMUSCULAR | Status: DC
  Administered 2020-06-03 – 2020-06-05 (×3): 40 mg via SUBCUTANEOUS
  Filled 2020-06-03 (×3): qty 0.4

## 2020-06-03 MED ORDER — INSULIN ASPART 100 UNIT/ML IJ SOLN
0.0000 [IU] | INTRAMUSCULAR | Status: DC
  Administered 2020-06-03 (×2): 2 [IU] via SUBCUTANEOUS

## 2020-06-03 MED ORDER — INSULIN DETEMIR 100 UNIT/ML ~~LOC~~ SOLN
15.0000 [IU] | Freq: Two times a day (BID) | SUBCUTANEOUS | Status: DC
  Administered 2020-06-03 (×2): 15 [IU] via SUBCUTANEOUS
  Filled 2020-06-03 (×4): qty 0.15

## 2020-06-03 MED ORDER — PANTOPRAZOLE SODIUM 40 MG IV SOLR
40.0000 mg | Freq: Two times a day (BID) | INTRAVENOUS | Status: DC
  Administered 2020-06-03 – 2020-06-04 (×3): 40 mg via INTRAVENOUS
  Filled 2020-06-03 (×3): qty 40

## 2020-06-03 MED ORDER — FUROSEMIDE 10 MG/ML IJ SOLN
40.0000 mg | Freq: Once | INTRAMUSCULAR | Status: AC
  Administered 2020-06-03: 40 mg via INTRAVENOUS
  Filled 2020-06-03: qty 4

## 2020-06-03 NOTE — Progress Notes (Signed)
      [address]       [place], [address]             [phone]      Ambulating currently  BP 130/68   Pulse 86   Temp 99 F (37.2 C)   Resp (!) 27   Ht 5' 5.35" (1.66 m)   Wt 83.5 kg   SpO2 97%   BMI 30.30 kg/m   Intake/Output Summary (Last 24 hours) at 06/03/2020 1804 Last data filed at 06/03/2020 1700 Gross per 24 hour  Intake 3386.08 ml  Output 3430 ml  Net -43.92 ml   Hct= 26  Doing well POD # 1  Brasen Bundren C. Roxan Hockey, MD [facility] [phone]

## 2020-06-03 NOTE — Progress Notes (Signed)
1 Day Post-Op Procedure(s) (LRB): CORONARY ARTERY BYPASS GRAFTING (CABG) TIMES THREE USING RIGHT GREATER SAPHENOUS VEIN HARVESTED ENDOSCOPICALLY AND LEFT INTERNAL MAMMARY ARTERY. (N/A) Subjective: C/o incisional pain  Objective: Vital signs in last 24 hours: Temp:  [95.9 F (35.5 C)-100 F (37.8 C)] 99.32 F (37.4 C) (05/28 0800) Pulse Rate:  [80-94] 89 (05/28 0800) Cardiac Rhythm: Normal sinus rhythm (05/28 0802) Resp:  [12-37] 23 (05/28 0800) BP: (78-150)/(50-83) 120/69 (05/28 0800) SpO2:  [92 %-100 %] 96 % (05/28 0800) Arterial Line BP: (88-157)/(52-76) 148/62 (05/28 0800) FiO2 (%):  [40 %-50 %] 40 % (05/27 1726) Weight:  [83.5 kg] 83.5 kg (05/28 0500)  Hemodynamic parameters for last 24 hours: PAP: (23-36)/(10-19) 34/12 CO:  [2.6 L/min-4.9 L/min] 4.9 L/min CI:  [1.4 L/min/m2-2.7 L/min/m2] 2.7 L/min/m2  Intake/Output from previous day: 05/27 0701 - 05/28 0700 In: 8354.9 [I.V.:4271.3; Blood:1589.7; IV Piggyback:2493.9] Out: 8850 [Urine:5405; Blood:1124; Chest Tube:440] Intake/Output this shift: Total I/O In: 37.8 [I.V.:31.1; IV Piggyback:6.7] Out: 75 [Urine:75]  General appearance: alert, cooperative and no distress Neurologic: intact Heart: regular rate and rhythm and + rub Lungs: diminished breath sounds bibasilar Abdomen: normal findings: soft, non-tender  Lab Results: Recent Labs    06/02/20 1807 06/02/20 1902 06/02/20 2307 06/03/20 0407  WBC 7.9  --   --  8.5  HGB 6.6*   < > 8.0* 7.8*  HCT 20.5*   < > 24.4* 23.8*  PLT 123*  --   --  131*   < > = values in this interval not displayed.   BMET:  Recent Labs    06/02/20 1807 06/02/20 1902 06/03/20 0407  NA 137 141 137  K 4.9 4.4 4.3  CL 106  --  105  CO2 24  --  22  GLUCOSE 123*  --  117*  BUN 12  --  10  CREATININE 1.08  --  1.19  CALCIUM 7.2*  --  7.0*    PT/INR:  Recent Labs    06/02/20 1343  LABPROT 17.8*  INR 1.5*   ABG    Component Value Date/Time   PHART 7.342 (L) 06/02/2020 1902    HCO3 24.0 06/02/2020 1902   TCO2 25 06/02/2020 1902   ACIDBASEDEF 2.0 06/02/2020 1902   O2SAT 99.0 06/02/2020 1902   CBG (last 3)  Recent Labs    06/03/20 0413 06/03/20 0610 06/03/20 0808  GLUCAP 110* 127* 126*    Assessment/Plan: S/P Procedure(s) (LRB): CORONARY ARTERY BYPASS GRAFTING (CABG) TIMES THREE USING RIGHT GREATER SAPHENOUS VEIN HARVESTED ENDOSCOPICALLY AND LEFT INTERNAL MAMMARY ARTERY. (N/A) POD # 1  Overall doing well CV- good hemodynamics- dc Swan and A line  ASA, beta blocker, statin RESP- IS for atelectasis RENAL- creatinine and lytes OK  Diurese GI- advance diet ENDO- CBG mildly elevated-   Transition from insulin drip to levemir + SSI Anemia secondary to ABL- transfused 1 unit, follow Dc chest tubes SCD + enoxaparin Mobilize   LOS: 4 days    Melrose Nakayama 06/03/2020

## 2020-06-03 NOTE — Plan of Care (Signed)

## 2020-06-03 NOTE — Progress Notes (Signed)
Progress Note  Patient Name: James Wolf Date of Encounter: 06/03/2020  [facility] Heart[facility] Cardiologist: None  Subjective    70 y.o. male prior smoker with HTN and HLD as well as prediabetes and family history of premature CAD who presented to [facility] on 05/29/2020 with recurrent chest pain-non-STEMI after initially leaving [place] from [place], [place] hospital on 05/28/2020 with elevated troponin levels.  After initial non-STEMI chest pain on the 22nd, he had recurrence of pain with minimal exertion leading to him coming to [facility].  With troponin elevations he was transferred to [facility] for cardiac catheterization on the afternoon of May 24 revealing multivessel disease. He had also been started on antibiotics for aspiration pneumonitis symptoms prior to transfer  Feels tired this morning.  Progressing well.  Inpatient Medications    Scheduled Meds: . acetaminophen  1,000 mg Oral Q6H   Or  . acetaminophen (TYLENOL) oral liquid 160 mg/5 mL  1,000 mg Per Tube Q6H  . aspirin EC  325 mg Oral Daily   Or  . aspirin  324 mg Per Tube Daily  . bisacodyl  10 mg Oral Daily   Or  . bisacodyl  10 mg Rectal Daily  . Chlorhexidine Gluconate Cloth  6 each Topical Daily  . docusate sodium  200 mg Oral Daily  . mouth rinse  15 mL Mouth Rinse BID  . metoprolol tartrate  12.5 mg Oral BID   Or  . metoprolol tartrate  12.5 mg Per Tube BID  . pantoprazole (PROTONIX) IV  40 mg Intravenous Q12H  . rosuvastatin  40 mg Oral q1800  . sodium chloride flush  10-40 mL Intracatheter Q12H  . sodium chloride flush  3 mL Intravenous Q12H   Continuous Infusions: . sodium chloride Stopped (06/03/20 0643)  . sodium chloride    . sodium chloride 10 mL/hr at 06/02/20 1423  . ampicillin-sulbactam (UNASYN) IV 200 mL/hr at 06/03/20 0802  .  ceFAZolin (ANCEF) IV Stopped (06/03/20 0438)  . dexmedetomidine (PRECEDEX) IV infusion Stopped (06/03/20 0446)  . insulin 0.5 mL/hr at 06/03/20  0802  . lactated ringers    . lactated ringers Stopped (06/03/20 0759)  . lactated ringers 20 mL/hr at 06/03/20 0802  . nitroGLYCERIN Stopped (06/03/20 0222)  . phenylephrine (NEO-SYNEPHRINE) Adult infusion Stopped (06/03/20 0645)   PRN Meds: sodium chloride, albuterol, dextrose, lactated ringers, metoprolol tartrate, midazolam, morphine injection, ondansetron (ZOFRAN) IV, oxyCODONE, sodium chloride flush, sodium chloride flush, traMADol   Vital Signs    Vitals:   06/03/20 0615 06/03/20 0630 06/03/20 0700 06/03/20 0800  BP:   114/69 120/69  Pulse: 86 86 88 89  Resp: (!) 30 (!) 29 (!) 27 (!) 23  Temp: 99.7 F (37.6 C) 99.5 F (37.5 C) 99.32 F (37.4 C) 99.32 F (37.4 C)  TempSrc:      SpO2: 96% 97% 95% 96%  Weight:      Height:        Intake/Output Summary (Last 24 hours) at 06/03/2020 8185 Last data filed at 06/03/2020 0802 Gross per 24 hour  Intake 8292.7 ml  Output 7044 ml  Net 1248.7 ml   Last 3 Weights 06/03/2020 06/02/2020 05/31/2020  Weight (lbs) 184 lb 1.4 oz 167 lb 8.8 oz 167 lb 1.7 oz  Weight (kg) 83.5 kg 76 kg 75.8 kg      Telemetry    Sinus rhythm- Personally Reviewed  ECG    Sinus rhythm 88 nonspecific T wave changes- Personally Reviewed  Physical Exam   GEN: No acute distress.   Neck: No JVD Cardiac: RRR, no murmurs, rubs, or gallops.  Postop CABG Respiratory: Clear to auscultation bilaterally. GI: Soft, nontender, non-distended  MS: No edema; No deformity. Neuro:  Nonfocal  Psych: Normal affect   Labs    High Sensitivity Troponin:   Recent Labs  Lab 05/29/20 1730 05/29/20 2329 05/30/20 0137 05/31/20 0827 05/31/20 0957  TROPONINIHS 16 14 13  24* 19*      Chemistry Recent Labs  Lab 05/30/20 0422 06/01/20 0034 06/01/20 1222 06/02/20 0321 06/02/20 0814 06/02/20 1202 06/02/20 1205 06/02/20 1807 06/02/20 1902 06/03/20 0407  NA 139   < > 137 136   < > 138   < > 137 141 137  K 3.8   < > 3.7 3.8   < > 4.5   < > 4.9 4.4 4.3  CL 105    < > 104 102   < > 102  --  106  --  105  CO2 26   < > 25 26  --   --   --  24  --  22  GLUCOSE 115*   < > 119* 101*   < > 117*  --  123*  --  117*  BUN 11   < > 11 14   < > 11  --  12  --  10  CREATININE 1.03   < > 1.17 1.23   < > 0.80  --  1.08  --  1.19  CALCIUM 8.6*   < > 8.8* 8.9  --   --   --  7.2*  --  7.0*  PROT 6.6  --  6.9  --   --   --   --   --   --   --   ALBUMIN 3.2*  --  2.9*  --   --   --   --   --   --   --   AST 16  --  18  --   --   --   --   --   --   --   ALT 17  --  16  --   --   --   --   --   --   --   ALKPHOS 48  --  48  --   --   --   --   --   --   --   BILITOT 0.7  --  0.7  --   --   --   --   --   --   --   GFRNONAA >60   < > >60 >60  --   --   --  >60  --  >60  ANIONGAP 8   < > 8 8  --   --   --  7  --  10   < > = values in this interval not displayed.     Hematology Recent Labs  Lab 06/02/20 1343 06/02/20 1347 06/02/20 1807 06/02/20 1902 06/02/20 2307 06/03/20 0407  WBC 11.7*  --  7.9  --   --  8.5  RBC 2.83*  --  2.24*  --   --  2.63*  HGB 8.4*   < > 6.6* 6.5* 8.0* 7.8*  HCT 26.0*   < > 20.5* 19.0* 24.4* 23.8*  MCV 91.9  --  91.5  --   --  90.5  MCH 29.7  --  29.5  --   --  29.7  MCHC 32.3  --  32.2  --   --  32.8  RDW 13.6  --  13.7  --   --  14.4  PLT 198  --  123*  --   --  131*   < > = values in this interval not displayed.    BNPNo results for input(s): BNP, PROBNP in the last 168 hours.   DDimer No results for input(s): DDIMER in the last 168 hours.   Radiology    DG Chest Port 1 View  Result Date: 06/02/2020 CLINICAL DATA:  Follow-up CABG EXAM: PORTABLE CHEST 1 VIEW COMPARISON:  05/29/2020 FINDINGS: Previous median sternotomy and CABG. Endotracheal tube tip 4 cm above the carina. Swan-Ganz catheter tip in the main pulmonary artery. Mediastinal drain in place. Left chest tube in place. No pneumothorax. Mild edema and mild basilar atelectasis. IMPRESSION: Lines and tubes well positioned. Mild edema and mild basilar atelectasis. No  pneumothorax peer Electronically Signed   By: Nelson Chimes M.D.   On: 06/02/2020 14:27    Cardiac Studies   Echo-normal EF 65%  Patient Profile     70 y.o. male post CABG, non-STEMI, COPD  Assessment & Plan    Non-STEMI - Multivessel disease, status post CABG progressing well. - When able, consider Plavix 75 mg daily given non-STEMI.  Currently on aspirin.  Hyperlipidemia -Continue with high intensity statin rosuvastatin 40 mg.  LDL goal less than 70.  COPD - Had been on Unasyn for aspiration pneumonitis.  Prediabetes - Hemoglobin A1c 6.2 -Could consider Jardiance or Farxiga on discharge.      For questions or updates, please contact [place] Please consult [email or web address] for contact info under        Signed, Candee Furbish, MD  06/03/2020, 8:22 AM

## 2020-06-04 ENCOUNTER — Inpatient Hospital Stay (HOSPITAL_COMMUNITY): Payer: BC Managed Care – PPO

## 2020-06-04 DIAGNOSIS — I214 Non-ST elevation (NSTEMI) myocardial infarction: Secondary | ICD-10-CM | POA: Diagnosis not present

## 2020-06-04 DIAGNOSIS — E785 Hyperlipidemia, unspecified: Secondary | ICD-10-CM | POA: Diagnosis not present

## 2020-06-04 DIAGNOSIS — Z951 Presence of aortocoronary bypass graft: Secondary | ICD-10-CM | POA: Diagnosis not present

## 2020-06-04 LAB — CBC
HCT: 26 % — ABNORMAL LOW (ref 39.0–52.0)
Hemoglobin: 8.5 g/dL — ABNORMAL LOW (ref 13.0–17.0)
MCH: 30.1 pg (ref 26.0–34.0)
MCHC: 32.7 g/dL (ref 30.0–36.0)
MCV: 92.2 fL (ref 80.0–100.0)
Platelets: 149 10*3/uL — ABNORMAL LOW (ref 150–400)
RBC: 2.82 MIL/uL — ABNORMAL LOW (ref 4.22–5.81)
RDW: 14.6 % (ref 11.5–15.5)
WBC: 12.5 10*3/uL — ABNORMAL HIGH (ref 4.0–10.5)
nRBC: 0 % (ref 0.0–0.2)

## 2020-06-04 LAB — BASIC METABOLIC PANEL
Anion gap: 5 (ref 5–15)
BUN: 18 mg/dL (ref 8–23)
CO2: 29 mmol/L (ref 22–32)
Calcium: 7.4 mg/dL — ABNORMAL LOW (ref 8.9–10.3)
Chloride: 103 mmol/L (ref 98–111)
Creatinine, Ser: 1.33 mg/dL — ABNORMAL HIGH (ref 0.61–1.24)
GFR, Estimated: 58 mL/min — ABNORMAL LOW (ref >60)
Glucose, Bld: 88 mg/dL (ref 70–99)
Potassium: 3.8 mmol/L (ref 3.5–5.1)
Sodium: 137 mmol/L (ref 135–145)

## 2020-06-04 LAB — GLUCOSE, CAPILLARY
Glucose-Capillary: 90 mg/dL (ref 70–99)
Glucose-Capillary: 91 mg/dL (ref 70–99)
Glucose-Capillary: 116 mg/dL — ABNORMAL HIGH (ref 70–99)
Glucose-Capillary: 113 mg/dL — ABNORMAL HIGH (ref 70–99)
Glucose-Capillary: 130 mg/dL — ABNORMAL HIGH (ref 70–99)

## 2020-06-04 IMAGING — DX DG CHEST 1V PORT
1 series · 1 of 1 positions shown · non-contrast
Comparison: [DATE]

CLINICAL DATA: Status post CABG

EXAM:
PORTABLE CHEST 1 VIEW

[chest ap]
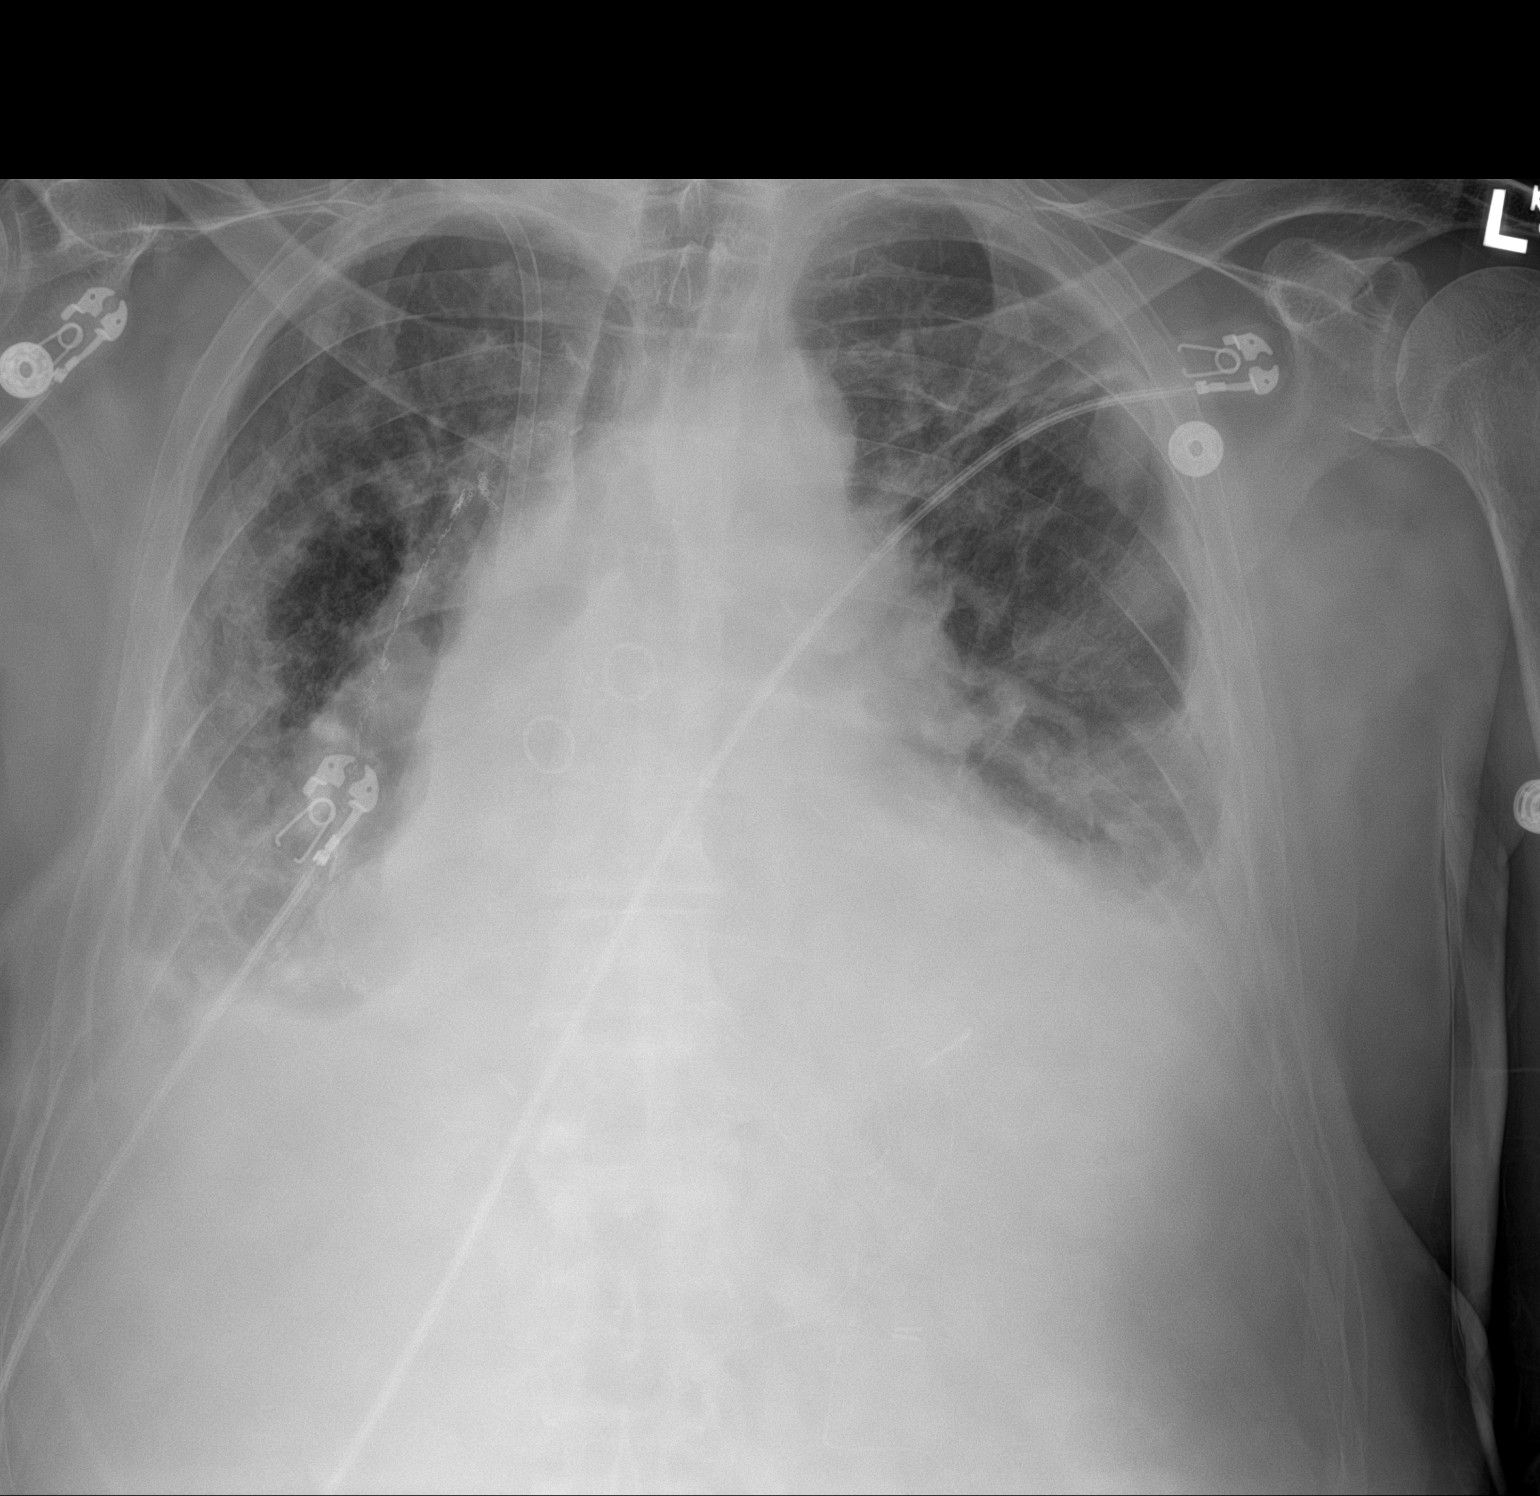

[1 of 1 positions shown; findings below may reference images not displayed]

FINDINGS: Removal of left chest tube and mediastinal drain. Removal of right
IJ Swan-Ganz catheter. Stable right IJ venous sheath.

Postsurgical changes in the right hemithorax. Small bilateral
pleural effusions, left greater than right, with associated lower
lobe atelectasis. Known gastric pull-through along the medial right
heart border.

No pneumothorax.

Cardiomegaly.  Postsurgical changes related to prior CABG.
IMPRESSION: Interval removal of left chest tube, mediastinal drain, and right IJ
Swan-Ganz catheter. No pneumothorax is seen.

Small bilateral pleural effusions, left greater than right.
Associated lower lobe atelectasis.

Postsurgical changes related to prior CABG.

## 2020-06-04 MED ORDER — SODIUM CHLORIDE 0.9 % IV SOLN: 250.0000 mL | INTRAVENOUS | Status: DC | PRN

## 2020-06-04 MED ORDER — POTASSIUM CHLORIDE CRYS ER 10 MEQ PO TBCR
20.0000 meq | EXTENDED_RELEASE_TABLET | Freq: Every day | ORAL | Status: DC
  Administered 2020-06-04: 20 meq via ORAL
  Filled 2020-06-04 (×3): qty 2

## 2020-06-04 MED ORDER — PANTOPRAZOLE SODIUM 40 MG PO TBEC
40.0000 mg | DELAYED_RELEASE_TABLET | Freq: Two times a day (BID) | ORAL | Status: DC
  Administered 2020-06-04 – 2020-06-08 (×8): 40 mg via ORAL
  Filled 2020-06-04 (×8): qty 1

## 2020-06-04 MED ORDER — SODIUM CHLORIDE 0.9% FLUSH
3.0000 mL | INTRAVENOUS | Status: DC | PRN
Start: 2020-06-04 — End: 2020-06-08

## 2020-06-04 MED ORDER — ALUM & MAG HYDROXIDE-SIMETH 200-200-20 MG/5ML PO SUSP: 15.0000 mL | Freq: Four times a day (QID) | ORAL | Status: DC | PRN

## 2020-06-04 MED ORDER — SODIUM CHLORIDE 0.9% FLUSH
3.0000 mL | Freq: Two times a day (BID) | INTRAVENOUS | Status: DC
  Administered 2020-06-04 – 2020-06-07 (×6): 3 mL via INTRAVENOUS

## 2020-06-04 MED ORDER — INSULIN ASPART 100 UNIT/ML IJ SOLN: 0.0000 [IU] | Freq: Three times a day (TID) | INTRAMUSCULAR | Status: DC

## 2020-06-04 MED ORDER — FUROSEMIDE 40 MG PO TABS
40.0000 mg | ORAL_TABLET | Freq: Every day | ORAL | Status: DC
  Administered 2020-06-04: 40 mg via ORAL
  Filled 2020-06-04: qty 1

## 2020-06-04 MED ORDER — ~~LOC~~ CARDIAC SURGERY, PATIENT & FAMILY EDUCATION: Freq: Once | Status: DC

## 2020-06-04 MED ORDER — ZOLPIDEM TARTRATE 5 MG PO TABS: 5.0000 mg | ORAL_TABLET | Freq: Every evening | ORAL | Status: DC | PRN

## 2020-06-04 MED ORDER — MAGNESIUM HYDROXIDE 400 MG/5ML PO SUSP: 30.0000 mL | Freq: Every day | ORAL | Status: DC | PRN

## 2020-06-04 NOTE — Progress Notes (Signed)
2 Days Post-Op Procedure(s) (LRB): CORONARY ARTERY BYPASS GRAFTING (CABG) TIMES THREE USING RIGHT GREATER SAPHENOUS VEIN HARVESTED ENDOSCOPICALLY AND LEFT INTERNAL MAMMARY ARTERY. (N/A) Subjective: No complaints this AM Denies nausea, some pain but manageable  Objective: Vital signs in last 24 hours: Temp:  [97.9 F (36.6 C)-99.32 F (37.4 C)] 98.7 F (37.1 C) (05/29 0700) Pulse Rate:  [85-95] 95 (05/29 0528) Cardiac Rhythm: Normal sinus rhythm (05/29 0000) Resp:  [21-33] 23 (05/29 0700) BP: (115-131)/(65-97) 124/75 (05/29 0700) SpO2:  [92 %-98 %] 96 % (05/29 0700) Arterial Line BP: (148-155)/(61-62) 155/62 (05/28 1000) Weight:  [80 kg] 80 kg (05/29 0500)  Hemodynamic parameters for last 24 hours: PAP: (33-34)/(12-13) 33/12  Intake/Output from previous day: 05/28 0701 - 05/29 0700 In: 1142.5 [P.O.:360; I.V.:376; IV Piggyback:406.5] Out: 2050 [Urine:2050] Intake/Output this shift: No intake/output data recorded.  General appearance: alert, cooperative and no distress Neurologic: intact Heart: regular rate and rhythm Lungs: diminished breath sounds bibasilar Abdomen: normal findings: soft, non-tender Wound: clean and dry  Lab Results: Recent Labs    06/03/20 1722 06/04/20 0450  WBC 13.3* 12.5*  HGB 8.4* 8.5*  HCT 26.2* 26.0*  PLT 151 149*   BMET:  Recent Labs    06/03/20 1722 06/04/20 0450  NA 137 137  K 4.0 3.8  CL 102 103  CO2 26 29  GLUCOSE 134* 88  BUN 12 18  CREATININE 1.36* 1.33*  CALCIUM 7.5* 7.4*    PT/INR:  Recent Labs    06/02/20 1343  LABPROT 17.8*  INR 1.5*   ABG    Component Value Date/Time   PHART 7.342 (L) 06/02/2020 1902   HCO3 24.0 06/02/2020 1902   TCO2 25 06/02/2020 1902   ACIDBASEDEF 2.0 06/02/2020 1902   O2SAT 99.0 06/02/2020 1902   CBG (last 3)  Recent Labs    06/03/20 2330 06/04/20 0352 06/04/20 0719  GLUCAP 99 90 91    Assessment/Plan: S/P Procedure(s) (LRB): CORONARY ARTERY BYPASS GRAFTING (CABG) TIMES  THREE USING RIGHT GREATER SAPHENOUS VEIN HARVESTED ENDOSCOPICALLY AND LEFT INTERNAL MAMMARY ARTERY. (N/A) -Lokks good CV- stable in SR  ASA, beta blocker, statin  Restart ARB when creatinine better RESP- CXR with atelectasis  Day 6/7 Unasyn for presumed aspiration pneumonia RENAL- creatinine stable, lytes OK  PO lasix, DC Foley ENDO- CBG well controlled  Dc levemir, change SSI to [facility] and [facility] Anemia secondary to ABL- mild, follow Continue cardiac rehab Dc central line   LOS: 5 days    Melrose Nakayama 06/04/2020

## 2020-06-04 NOTE — Progress Notes (Addendum)
Progress Note  Patient Name: James Wolf Date of Encounter: 06/04/2020  [facility] HeartCare Cardiologist: None   Subjective   Eating breakfast.  Overall doing well.  No significant chest pain.  Awaiting transfer to progressive care.  Inpatient Medications    Scheduled Meds: . acetaminophen  1,000 mg Oral Q6H   Or  . acetaminophen (TYLENOL) oral liquid 160 mg/5 mL  1,000 mg Per Tube Q6H  . aspirin EC  325 mg Oral Daily   Or  . aspirin  324 mg Per Tube Daily  . bisacodyl  10 mg Oral Daily   Or  . bisacodyl  10 mg Rectal Daily  . Chlorhexidine Gluconate Cloth  6 each Topical Daily  . docusate sodium  200 mg Oral Daily  . enoxaparin (LOVENOX) injection  40 mg Subcutaneous QHS  . furosemide  40 mg Oral Daily  . insulin aspart  0-15 Units Subcutaneous TID WC  . mouth rinse  15 mL Mouth Rinse BID  . metoprolol tartrate  12.5 mg Oral BID   Or  . metoprolol tartrate  12.5 mg Per Tube BID  . pantoprazole (PROTONIX) IV  40 mg Intravenous Q12H  . potassium chloride  20 mEq Oral Daily  . rosuvastatin  40 mg Oral q1800  . sodium chloride flush  10-40 mL Intracatheter Q12H  . sodium chloride flush  3 mL Intravenous Q12H   Continuous Infusions: . sodium chloride Stopped (06/03/20 0643)  . sodium chloride    . sodium chloride 10 mL/hr at 06/02/20 1423  . ampicillin-sulbactam (UNASYN) IV 200 mL/hr at 06/04/20 0800  . dexmedetomidine (PRECEDEX) IV infusion Stopped (06/03/20 0446)  . lactated ringers    . lactated ringers Stopped (06/03/20 1351)  . lactated ringers Stopped (06/03/20 1227)  . nitroGLYCERIN Stopped (06/03/20 0222)  . phenylephrine (NEO-SYNEPHRINE) Adult infusion Stopped (06/03/20 0645)   PRN Meds: sodium chloride, albuterol, lactated ringers, metoprolol tartrate, midazolam, morphine injection, ondansetron (ZOFRAN) IV, oxyCODONE, sodium chloride flush, sodium chloride flush, traMADol   Vital Signs    Vitals:   06/04/20 0500 06/04/20 0528 06/04/20 0700 06/04/20  0800  BP:  131/78 124/75   Pulse: 95 95 97 96  Resp: (!) 24 (!) 25 (!) 23 (!) 22  Temp:   98.7 F (37.1 C)   TempSrc:      SpO2: 95% 96% 96% 97%  Weight: 80 kg     Height:        Intake/Output Summary (Last 24 hours) at 06/04/2020 0856 Last data filed at 06/04/2020 0800 Gross per 24 hour  Intake 1128.16 ml  Output 1975 ml  Net -846.84 ml   Last 3 Weights 06/04/2020 06/03/2020 06/02/2020  Weight (lbs) 176 lb 5.9 oz 184 lb 1.4 oz 167 lb 8.8 oz  Weight (kg) 80 kg 83.5 kg 76 kg      Telemetry    No adverse arrhythmias- Personally Reviewed  ECG    No new- Personally Reviewed  Physical Exam   GEN: No acute distress.   Neck: No JVD Cardiac: RRR, no murmurs, rubs, or gallops.  Respiratory: Clear to auscultation bilaterally. GI: Soft, nontender, non-distended  MS: No edema; No deformity. Neuro:  Nonfocal  Psych: Normal affect   Labs    High Sensitivity Troponin:   Recent Labs  Lab 05/29/20 1730 05/29/20 2329 05/30/20 0137 05/31/20 0827 05/31/20 0957  TROPONINIHS 16 14 13  24* 19*      Chemistry Recent Labs  Lab 05/30/20 0422 06/01/20 0034 06/01/20 1222 06/02/20 0321  06/03/20 0407 06/03/20 1722 06/04/20 0450  NA 139   < > 137   < > 137 137 137  K 3.8   < > 3.7   < > 4.3 4.0 3.8  CL 105   < > 104   < > 105 102 103  CO2 26   < > 25   < > 22 26 29   GLUCOSE 115*   < > 119*   < > 117* 134* 88  BUN 11   < > 11   < > 10 12 18   CREATININE 1.03   < > 1.17   < > 1.19 1.36* 1.33*  CALCIUM 8.6*   < > 8.8*   < > 7.0* 7.5* 7.4*  PROT 6.6  --  6.9  --   --   --   --   ALBUMIN 3.2*  --  2.9*  --   --   --   --   AST 16  --  18  --   --   --   --   ALT 17  --  16  --   --   --   --   ALKPHOS 48  --  48  --   --   --   --   BILITOT 0.7  --  0.7  --   --   --   --   GFRNONAA >60   < > >60   < > >60 57* 58*  ANIONGAP 8   < > 8   < > 10 9 5    < > = values in this interval not displayed.     Hematology Recent Labs  Lab 06/03/20 0407 06/03/20 1722 06/04/20 0450   WBC 8.5 13.3* 12.5*  RBC 2.63* 2.88* 2.82*  HGB 7.8* 8.4* 8.5*  HCT 23.8* 26.2* 26.0*  MCV 90.5 91.0 92.2  MCH 29.7 29.2 30.1  MCHC 32.8 32.1 32.7  RDW 14.4 14.6 14.6  PLT 131* 151 149*    BNPNo results for input(s): BNP, PROBNP in the last 168 hours.   DDimer No results for input(s): DDIMER in the last 168 hours.   Radiology    DG Chest Port 1 View  Result Date: 06/04/2020 CLINICAL DATA:  Status post CABG EXAM: PORTABLE CHEST 1 VIEW COMPARISON:  06/03/2020 FINDINGS: Removal of left chest tube and mediastinal drain. Removal of right IJ Swan-Ganz catheter. Stable right IJ venous sheath. Postsurgical changes in the right hemithorax. Small bilateral pleural effusions, left greater than right, with associated lower lobe atelectasis. 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 No pneumothorax. Cardiomegaly.  Postsurgical changes related to prior CABG. IMPRESSION: Interval removal of left chest tube, mediastinal drain, and right IJ Swan-Ganz catheter. No pneumothorax is seen. Small bilateral pleural effusions, left greater than right. Associated lower lobe atelectasis. Postsurgical changes related to prior CABG. Electronically Signed   By: Julian Hy M.D.   On: 06/04/2020 08:08   DG Chest Port 1 View  Result Date: 06/03/2020 CLINICAL DATA:  Status post CABG EXAM: PORTABLE CHEST 1 VIEW COMPARISON:  06/02/2020 FINDINGS: Interval extubation. Stable right IJ Swan-Ganz catheter. Stable left chest tube and mediastinal drain. Platelike scarring in the left upper lobe. Small left pleural effusion, mildly increased. Trace right pleural effusion. Associated bilateral lower lobe opacities, likely atelectasis. Pulmonary vascular congestion without frank interstitial edema. Postsurgical changes in the right hemithorax. Cardiomegaly.  Postsurgical changes related to prior CABG. IMPRESSION: Interval extubation.  Additional support apparatus as above.  No pneumothorax. Small bilateral  pleural effusions, left greater than right, mildly increased. Associated lower lobe opacities, likely atelectasis. Electronically Signed   By: Julian Hy M.D.   On: 06/03/2020 08:35    Cardiac Studies   EF 65% on echo.  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  Prior smoker with HTN and HLD as well as prediabetes and family history of premature CAD who presented to [facility] on 05/29/2020 with recurrent chest pain-non-STEMI after initially leaving [facility] from [place], [place] hospital on 05/28/2020 with elevated troponin levels. After initial non-STEMI chest pain on the 22nd, he had recurrence of pain with minimal exertion leading to him coming to [facility]. With troponin elevations he was transferred to [facility] for cardiac catheterization on the afternoon of May 24 revealing multivessel disease.  Assessment & Plan    Non-STEMI - Consider Plavix closer to discharge  CAD - Post CABG -Aspirin -Lasix 40 mg daily. -Low-dose beta-blocker  Hyperlipidemia - High intensity Crestor 40 mg.   For questions or updates, please contact [place] Please consult [email or web address] for contact info under        Signed, Candee Furbish, MD  06/04/2020, 8:56 AM

## 2020-06-04 NOTE — Progress Notes (Signed)
Mobility Specialist - Progress Note   06/04/20 1652  Mobility  Activity Ambulated in hall  Level of Assistance Minimal assist, patient does 75% or more  Assistive Device Front wheel walker  Distance Ambulated (ft) 90 ft  Mobility Ambulated with assistance in hallway  Mobility Response Tolerated poorly  Mobility performed by Mobility specialist  $Mobility charge 1 Mobility   Pre-mobility, 3L O2: 88 HR, 97% SpO2 Post-mobility, 3L O2: 95 HR, 95% SpO2  Pt min assist in order to sit up on the edge of the bed. Once sitting up, pt became nauseous and vomited. RN in room to assess pt, pt then stood and pivoted to chair to void 220 mL of urine while his sheets were changed. He reported this was his first time voiding since getting his catheter removed. Pt then ambulated to nurse station and back to his bed, and had another episode of vomiting. He was min assist to lay back into bed. BP was 127/80 and his blood glucose was 113 prior to getting out of bed.   Pricilla Handler Mobility Specialist Mobility Specialist Phone: [phone]

## 2020-06-04 NOTE — Plan of Care (Signed)

## 2020-06-04 NOTE — Progress Notes (Signed)
Patient admitted to [facility] from [place]. VS are stable. CHG bath given. Pt is A/O x4. Placed on telemetry. Wife at the bedside.

## 2020-06-04 NOTE — Progress Notes (Signed)
Courtenay RN aware of order to remove CVC.

## 2020-06-05 DIAGNOSIS — I25110 Atherosclerotic heart disease of native coronary artery with unstable angina pectoris: Secondary | ICD-10-CM | POA: Diagnosis not present

## 2020-06-05 DIAGNOSIS — I471 Supraventricular tachycardia: Secondary | ICD-10-CM

## 2020-06-05 DIAGNOSIS — I214 Non-ST elevation (NSTEMI) myocardial infarction: Secondary | ICD-10-CM | POA: Diagnosis not present

## 2020-06-05 DIAGNOSIS — I10 Essential (primary) hypertension: Secondary | ICD-10-CM | POA: Diagnosis not present

## 2020-06-05 LAB — CBC
HCT: 27.3 % — ABNORMAL LOW (ref 39.0–52.0)
Hemoglobin: 8.7 g/dL — ABNORMAL LOW (ref 13.0–17.0)
MCH: 29.3 pg (ref 26.0–34.0)
MCHC: 31.9 g/dL (ref 30.0–36.0)
MCV: 91.9 fL (ref 80.0–100.0)
Platelets: 185 10*3/uL (ref 150–400)
RBC: 2.97 MIL/uL — ABNORMAL LOW (ref 4.22–5.81)
RDW: 14.8 % (ref 11.5–15.5)
WBC: 5.3 10*3/uL (ref 4.0–10.5)
nRBC: 0 % (ref 0.0–0.2)

## 2020-06-05 LAB — BASIC METABOLIC PANEL
Anion gap: 9 (ref 5–15)
BUN: 21 mg/dL (ref 8–23)
CO2: 23 mmol/L (ref 22–32)
Calcium: 7.7 mg/dL — ABNORMAL LOW (ref 8.9–10.3)
Chloride: 106 mmol/L (ref 98–111)
Creatinine, Ser: 1.25 mg/dL — ABNORMAL HIGH (ref 0.61–1.24)
GFR, Estimated: >60 mL/min (ref >60)
Glucose, Bld: 114 mg/dL — ABNORMAL HIGH (ref 70–99)
Potassium: 5 mmol/L (ref 3.5–5.1)
Sodium: 138 mmol/L (ref 135–145)

## 2020-06-05 LAB — GLUCOSE, CAPILLARY: Glucose-Capillary: 123 mg/dL — ABNORMAL HIGH (ref 70–99)

## 2020-06-05 MED ORDER — PROMETHAZINE HCL 12.5 MG RE SUPP
12.5000 mg | Freq: Four times a day (QID) | RECTAL | Status: DC | PRN
  Filled 2020-06-05: qty 1

## 2020-06-05 MED ORDER — OXYCODONE HCL 5 MG PO TABS
5.0000 mg | ORAL_TABLET | Freq: Four times a day (QID) | ORAL | Status: DC | PRN
  Administered 2020-06-06 – 2020-06-08 (×4): 5 mg via ORAL
  Filled 2020-06-05 (×6): qty 1

## 2020-06-05 MED ORDER — SODIUM CHLORIDE 0.9 % IV SOLN: INTRAVENOUS | Status: DC

## 2020-06-05 MED ORDER — METOCLOPRAMIDE HCL 5 MG/ML IJ SOLN
10.0000 mg | Freq: Four times a day (QID) | INTRAMUSCULAR | Status: AC
  Administered 2020-06-05 – 2020-06-07 (×8): 10 mg via INTRAVENOUS
  Filled 2020-06-05 (×8): qty 2

## 2020-06-05 MED ORDER — PROMETHAZINE HCL 12.5 MG PO TABS
12.5000 mg | ORAL_TABLET | Freq: Four times a day (QID) | ORAL | Status: DC | PRN
  Filled 2020-06-05: qty 1

## 2020-06-05 MED ORDER — FUROSEMIDE 40 MG PO TABS: 40.0000 mg | ORAL_TABLET | Freq: Every day | ORAL | Status: DC

## 2020-06-05 MED ORDER — TRAMADOL HCL 50 MG PO TABS
50.0000 mg | ORAL_TABLET | Freq: Four times a day (QID) | ORAL | Status: DC | PRN
  Administered 2020-06-06 – 2020-06-07 (×4): 50 mg via ORAL
  Filled 2020-06-05 (×4): qty 1

## 2020-06-05 MED ORDER — SODIUM CHLORIDE 0.9 % IV SOLN
12.5000 mg | Freq: Once | INTRAVENOUS | Status: AC
  Administered 2020-06-05: 12.5 mg via INTRAVENOUS
  Filled 2020-06-05: qty 0.5

## 2020-06-05 MED ORDER — SODIUM CHLORIDE 0.9 % IV SOLN
12.5000 mg | Freq: Four times a day (QID) | INTRAVENOUS | Status: DC | PRN
  Filled 2020-06-05: qty 0.5

## 2020-06-05 MED ORDER — METOPROLOL TARTRATE 25 MG PO TABS
25.0000 mg | ORAL_TABLET | Freq: Two times a day (BID) | ORAL | Status: DC
  Administered 2020-06-05 – 2020-06-06 (×3): 25 mg via ORAL
  Filled 2020-06-05 (×3): qty 1

## 2020-06-05 NOTE — Progress Notes (Signed)
Pt woke up throwing up. Pt stated it came from out of the blue, and sated he don't want to aspirate. When asked if he did while throwing up, pt was not sure. Lung clear, congested in his throat. Offered breathing treatment but pt denied. O2 sat 95% in 3l o2. Prn Zofran given. Will continue to monitor.

## 2020-06-05 NOTE — Progress Notes (Addendum)
      [address]       [place] [address]             [phone]        3 Days Post-Op Procedure(s) (LRB): CORONARY ARTERY BYPASS GRAFTING (CABG) TIMES THREE USING RIGHT GREATER SAPHENOUS VEIN HARVESTED ENDOSCOPICALLY AND LEFT INTERNAL MAMMARY ARTERY. (N/A)  Subjective: Patient with nausea and emesis. He is not passing flatus and has not had a bowel movement. He denies abdominal pain  Objective: Vital signs in last 24 hours: Temp:  [97.4 F (36.3 C)-98.8 F (37.1 C)] 98.4 F (36.9 C) (05/30 0738) Pulse Rate:  [86-106] 103 (05/30 0738) Cardiac Rhythm: Sinus tachycardia (05/30 0738) Resp:  [18-23] 20 (05/30 0738) BP: (107-145)/(60-96) 144/96 (05/30 0738) SpO2:  [95 %-99 %] 99 % (05/30 0738)  Pre op weight 76 kg Current Weight  06/04/20 80 kg      Intake/Output from previous day: 05/29 0701 - 05/30 0700 In: 1298.1 [P.O.:1098; IV Piggyback:200.1] Out: 200 [Urine:200]   Physical Exam:  Cardiovascular: Slightly tachycardic Pulmonary: Diminshed bibasilar breath sounds Abdomen: Soft, non tender, some distention, occasional bowel sounds present. Extremities: Mild bilateral lower extremity edema. Wounds: Sternal dressing removed and wound is clean and dry.  No erythema or signs of infection. RLE wound is clean and dry  Lab Results: CBC: Recent Labs    06/04/20 0450 06/05/20 0305  WBC 12.5* 5.3  HGB 8.5* 8.7*  HCT 26.0* 27.3*  PLT 149* 185   BMET:  Recent Labs    06/04/20 0450 06/05/20 0027  NA 137 138  K 3.8 5.0  CL 103 106  CO2 29 23  GLUCOSE 88 114*  BUN 18 21  CREATININE 1.33* 1.25*  CALCIUM 7.4* 7.7*    PT/INR:  Lab Results  Component Value Date   INR 1.5 (H) 06/02/2020   INR 1.0 06/01/2020   INR 1.0 05/30/2020   ABG:  INR: Will add last result for INR, ABG once components are confirmed Will add last 4 CBG results once components are confirmed  Assessment/Plan:  1. CV - ST at times. On Lopressor 25 mg bid (this dose  to be started today). Monitor and may need to increase BB. Will consider restarting Telmisartan in am as long as BP will tolerate. 2.  Pulmonary - On 3 liters of oxygen via [place]. Wean as able. PA/LAT CXR ordered for today. Encourage incentive spirometer 3. Volume Overload - On Lasix 40 mg daily but will hold for now 4.  Expected post op acute blood loss anemia - H and H this am stable at 8.7 and 27.3 5. CBGs 113/130/123. Pre op HGA1C 6.2. 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 Will provide nutritional information and he will need to follow up with medical doctor after discharge. Stop accu checks and SS PRN 6. Mild thrombocytopenia resolved-Platelets up to 185,000 7. GI-Zofran PRN. Will give Reglan. Per discussion with Dr. Roxan Hockey, IVF at 50 ml/hr. Per patient request, no diet until this afternoon. May need to make NPO, monitor closely. Limit narcotics. May need to obtain KUB if worsens. 8. Remove EPW in am  [place] 06/05/2020,8:19 AM Patient seen and examined, agree with above Nausea and emesis overnight, didn't sleep well. Exam benign. Will give IVF, Reglan, phenergan PRN  Revonda Standard. Roxan Hockey, MD [facility] [phone]

## 2020-06-05 NOTE — Progress Notes (Signed)
Mobility Specialist: Progress Note   06/05/20 1652  Mobility  Activity Ambulated in hall  Level of Assistance Minimal assist, patient does 75% or more  Assistive Device Front wheel walker  Distance Ambulated (ft) 140 ft  Mobility Ambulated with assistance in hallway  Mobility Response Tolerated well  Mobility performed by Mobility specialist  $Mobility charge 1 Mobility   Pre-Mobility: 87 HR, 98% SpO2 Post-Mobility: 104 HR, 147/92 (110) BP, 100% SpO2  Pt ambulated on 3 L/min [place]. Pt c/o feeling a little SOB during ambulation, otherwise asx. Pt back to bed after walk with call bell at his side and family member present in room.   [facility] Sole Lengacher Mobility Specialist Mobility Specialist Phone: [phone]

## 2020-06-05 NOTE — Discharge Instructions (Signed)
Discharge Instructions:  1. You may shower, please wash incisions daily with soap and water and keep dry.  If you wish to cover wounds with dressing you may do so but please keep clean and change daily.  No tub baths or swimming until incisions have completely healed.  If your incisions become red or develop any drainage please call our office at [phone]  2. No Driving until cleared by Dr. Guy Sandifer office and you are no longer using narcotic pain medications  3. Monitor your weight daily.. Please use the same scale and weigh at same time... If you gain 5-10 lbs in 48 hours with associated lower extremity swelling, please contact our office at [phone]  4. Fever of 101.5 for at least 24 hours with no source, please contact our office at [phone]  5. Activity- up as tolerated, please walk at least 3 times per day.  Avoid strenuous activity, no lifting, pushing, or pulling with your arms over 8-10 lbs for a minimum of 6 weeks  6. If any questions or concerns arise, please do not hesitate to contact our office at [phone]   Prediabetes Eating Plan Prediabetes is a condition that causes blood sugar (glucose) levels to be higher than normal. This increases the risk for developing type 2 diabetes (type 2 diabetes mellitus). Working with a health care provider or nutrition specialist (dietitian) to make diet and lifestyle changes can help prevent the onset of diabetes. These changes may help you:  Control your blood glucose levels.  Improve your cholesterol levels.  Manage your blood pressure. What are tips for following this plan? Reading food labels  Read food labels to check the amount of fat, salt (sodium), and sugar in prepackaged foods. Avoid foods that have: ? Saturated fats. ? Trans fats. ? Added sugars.  Avoid foods that have more than 300 milligrams (mg) of sodium per serving. Limit your sodium intake to less than 2,300 mg each day. Shopping  Avoid buying pre-made  and processed foods.  Avoid buying drinks with added sugar. Cooking  Cook with olive oil. Do not use butter, lard, or ghee.  Bake, broil, grill, steam, or boil foods. Avoid frying. Meal planning  Work with your dietitian to create an eating plan that is right for you. This may include tracking how many calories you take in each day. Use a food diary, notebook, or mobile application to track what you eat at each meal.  Consider following a Mediterranean diet. This includes: ? Eating several servings of fresh fruits and vegetables each day. ? Eating fish at least twice a week. ? Eating one serving each day of whole grains, beans, nuts, and seeds. ? Using olive oil instead of other fats. ? Limiting alcohol. ? Limiting red meat. ? Using nonfat or low-fat dairy products.  Consider following a plant-based diet. This includes dietary choices that focus on eating mostly vegetables and fruit, grains, beans, nuts, and seeds.  If you have high blood pressure, you may need to limit your sodium intake or follow a diet such as the DASH (Dietary Approaches to Stop Hypertension) eating plan. The DASH diet aims to lower high blood pressure.   Lifestyle  Set weight loss goals with help from your health care team. It is recommended that most people with prediabetes lose 7% of their body weight.  Exercise for at least 30 minutes 5 or more days a week.  Attend a support group or seek support from a mental health counselor.  Take over-the-counter and  prescription medicines only as told by your health care provider. What foods are recommended? Fruits Berries. Bananas. Apples. Oranges. Grapes. Papaya. Mango. Pomegranate. Kiwi. Grapefruit. Cherries. Vegetables Lettuce. Spinach. Peas. Beets. Cauliflower. Cabbage. Broccoli. Carrots. Tomatoes. Squash. Eggplant. Herbs. Peppers. Onions. Cucumbers. Brussels sprouts. Grains Whole grains, such as whole-wheat or whole-grain breads, crackers, cereals, and pasta.  Unsweetened oatmeal. Bulgur. Barley. Quinoa. Brown rice. Corn or whole-wheat flour tortillas or taco shells. Meats and other proteins Seafood. Poultry without skin. Lean cuts of pork and beef. Tofu. Eggs. Nuts. Beans. Dairy Low-fat or fat-free dairy products, such as yogurt, cottage cheese, and cheese. Beverages Water. Tea. Coffee. Sugar-free or diet soda. Seltzer water. Low-fat or nonfat milk. Milk alternatives, such as soy or almond milk. Fats and oils Olive oil. Canola oil. Sunflower oil. Grapeseed oil. Avocado. Walnuts. Sweets and desserts Sugar-free or low-fat pudding. Sugar-free or low-fat ice cream and other frozen treats. Seasonings and condiments Herbs. Sodium-free spices. Mustard. Relish. Low-salt, low-sugar ketchup. Low-salt, low-sugar barbecue sauce. Low-fat or fat-free mayonnaise. The items listed above may not be a complete list of recommended foods and beverages. Contact a dietitian for more information. What foods are not recommended? Fruits Fruits canned with syrup. Vegetables Canned vegetables. Frozen vegetables with butter or cream sauce. Grains Refined white flour and flour products, such as bread, pasta, snack foods, and cereals. Meats and other proteins Fatty cuts of meat. Poultry with skin. Breaded or fried meat. Processed meats. Dairy Full-fat yogurt, cheese, or milk. Beverages Sweetened drinks, such as iced tea and soda. Fats and oils Butter. Lard. Ghee. Sweets and desserts Baked goods, such as cake, cupcakes, pastries, cookies, and cheesecake. Seasonings and condiments Spice mixes with added salt. Ketchup. Barbecue sauce. Mayonnaise. The items listed above may not be a complete list of foods and beverages that are not recommended. Contact a dietitian for more information. Where to find more information  American Diabetes Association: www.diabetes.org Summary  You may need to make diet and lifestyle changes to help prevent the onset of diabetes. These  changes can help you control blood sugar, improve cholesterol levels, and manage blood pressure.  Set weight loss goals with help from your health care team. It is recommended that most people with prediabetes lose 7% of their body weight.  Consider following a Mediterranean diet. This includes eating plenty of fresh fruits and vegetables, whole grains, beans, nuts, seeds, fish, and low-fat dairy, and using olive oil instead of other fats. This information is not intended to replace advice given to you by your health care provider. Make sure you discuss any questions you have with your health care provider. Document Revised: 03/25/2019 Document Reviewed: 03/25/2019 Elsevier Patient Education  [place].

## 2020-06-05 NOTE — Progress Notes (Signed)
Progress Note  Patient Name: James Wolf Date of Encounter: 06/05/2020  [facility] Cardiologist: None   Subjective   Complained of N/V last night and pulling in his chest.  No dyspnea. No BM yet.   Inpatient Medications    Scheduled Meds: . acetaminophen  1,000 mg Oral Q6H   Or  . acetaminophen (TYLENOL) oral liquid 160 mg/5 mL  1,000 mg Per Tube Q6H  . aspirin EC  325 mg Oral Daily   Or  . aspirin  324 mg Per Tube Daily  . bisacodyl  10 mg Oral Daily   Or  . bisacodyl  10 mg Rectal Daily  . [facility], Patient & Family Education   Does not apply Once  . docusate sodium  200 mg Oral Daily  . enoxaparin (LOVENOX) injection  40 mg Subcutaneous QHS  . furosemide  40 mg Oral Daily  . insulin aspart  0-15 Units Subcutaneous TID WC  . mouth rinse  15 mL Mouth Rinse BID  . metoprolol tartrate  12.5 mg Oral BID   Or  . metoprolol tartrate  12.5 mg Per Tube BID  . pantoprazole  40 mg Oral BID  . potassium chloride  20 mEq Oral Daily  . rosuvastatin  40 mg Oral q1800  . sodium chloride flush  3 mL Intravenous Q12H   Continuous Infusions: . sodium chloride     PRN Meds: sodium chloride, albuterol, alum & mag hydroxide-simeth, magnesium hydroxide, metoprolol tartrate, ondansetron (ZOFRAN) IV, oxyCODONE, sodium chloride flush, zolpidem   Vital Signs    Vitals:   06/05/20 0018 06/05/20 0338 06/05/20 0538 06/05/20 0625  BP: (!) 137/93 (!) 144/93 (!) 145/92   Pulse: (!) 106 (!) 105    Resp: 20 20 (!) 22 20  Temp: 98.5 F (36.9 C) 98.8 F (37.1 C) 98.2 F (36.8 C)   TempSrc: Oral Oral Oral   SpO2: 95% 96%    Weight:      Height:        Intake/Output Summary (Last 24 hours) at 06/05/2020 5366 Last data filed at 06/04/2020 2104 Gross per 24 hour  Intake 560.07 ml  Output 200 ml  Net 360.07 ml   Last 3 Weights 06/04/2020 06/03/2020 06/02/2020  Weight (lbs) 176 lb 5.9 oz 184 lb 1.4 oz 167 lb 8.8 oz  Weight (kg) 80 kg 83.5 kg 76 kg       Telemetry    Episode of rapid SVT this am- Personally Reviewed  ECG    No new- Personally Reviewed  Physical Exam   GEN: No acute distress.   Neck: No JVD Cardiac: RRR, no murmurs, rubs, or gallops.  Respiratory: Clear to auscultation bilaterally. GI: Soft, nontender, non-distended  MS: No edema; No deformity. Neuro:  Nonfocal  Psych: Normal affect   Labs    High Sensitivity Troponin:   Recent Labs  Lab 05/29/20 1730 05/29/20 2329 05/30/20 0137 05/31/20 0827 05/31/20 0957  TROPONINIHS 16 14 13  24* 19*      Chemistry Recent Labs  Lab 05/30/20 0422 06/01/20 0034 06/01/20 1222 06/02/20 0321 06/03/20 1722 06/04/20 0450 06/05/20 0027  NA 139   < > 137   < > 137 137 138  K 3.8   < > 3.7   < > 4.0 3.8 5.0  CL 105   < > 104   < > 102 103 106  CO2 26   < > 25   < > 26 29 23   GLUCOSE  115*   < > 119*   < > 134* 88 114*  BUN 11   < > 11   < > 12 18 21   CREATININE 1.03   < > 1.17   < > 1.36* 1.33* 1.25*  CALCIUM 8.6*   < > 8.8*   < > 7.5* 7.4* 7.7*  PROT 6.6  --  6.9  --   --   --   --   ALBUMIN 3.2*  --  2.9*  --   --   --   --   AST 16  --  18  --   --   --   --   ALT 17  --  16  --   --   --   --   ALKPHOS 48  --  48  --   --   --   --   BILITOT 0.7  --  0.7  --   --   --   --   GFRNONAA >60   < > >60   < > 57* 58* >60  ANIONGAP 8   < > 8   < > 9 5 9    < > = values in this interval not displayed.     Hematology Recent Labs  Lab 06/03/20 1722 06/04/20 0450 06/05/20 0305  WBC 13.3* 12.5* 5.3  RBC 2.88* 2.82* 2.97*  HGB 8.4* 8.5* 8.7*  HCT 26.2* 26.0* 27.3*  MCV 91.0 92.2 91.9  MCH 29.2 30.1 29.3  MCHC 32.1 32.7 31.9  RDW 14.6 14.6 14.8  PLT 151 149* 185    BNPNo results for input(s): BNP, PROBNP in the last 168 hours.   DDimer No results for input(s): DDIMER in the last 168 hours.   Radiology    DG Chest Port 1 View  Result Date: 06/04/2020 CLINICAL DATA:  Status post CABG EXAM: PORTABLE CHEST 1 VIEW COMPARISON:  06/03/2020 FINDINGS:  Removal of left chest tube and mediastinal drain. Removal of right IJ Swan-Ganz catheter. Stable right IJ venous sheath. Postsurgical changes in the right hemithorax. Small bilateral pleural effusions, left greater than right, with associated lower lobe atelectasis. 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 No pneumothorax. Cardiomegaly.  Postsurgical changes related to prior CABG. IMPRESSION: Interval removal of left chest tube, mediastinal drain, and right IJ Swan-Ganz catheter. No pneumothorax is seen. Small bilateral pleural effusions, left greater than right. Associated lower lobe atelectasis. Postsurgical changes related to prior CABG. Electronically Signed   By: Julian Hy M.D.   On: 06/04/2020 08:08    Cardiac Studies   EF 65% on echo.  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  Prior smoker with HTN and HLD as well as prediabetes and family history of premature CAD who presented to [facility] on 05/29/2020 with recurrent chest pain-non-STEMI after initially leaving AMA from [place], [place] hospital on 05/28/2020 with elevated troponin levels. After initial non-STEMI chest pain on the 22nd, he had recurrence of pain with minimal exertion leading to him coming to [facility]. With troponin elevations he was transferred to [facility] for cardiac catheterization on the afternoon of May 24 revealing multivessel disease.  Assessment & Plan    Non-STEMI - Consider Plavix closer to discharge  CAD - Post CABG -Aspirin -Lasix 40 mg daily. -on metoprolol  Hyperlipidemia - High intensity Crestor 40 mg.  SVT- will increase metoprolol.   For questions or updates,  please contact [place] Please consult [email or web address] for contact info under        Signed, Zhanna Melin [place], MD  06/05/2020, 7:22 AM

## 2020-06-05 NOTE — Progress Notes (Signed)
Pt had short run of an episode of SVT 150-160's when went to check , pt had vomited again. Michela Pitcher he is still nauseated and Zofran helped a little not much. Lung sound diminished, abdomen distended with hypoactive bowel sound. Pt stated he does not recall passing gas/bowel movement since surgery. Pt has tachypnea (RR 22-24) with labored breathing and diaphoretic. Called Dr. Roxan Hockey and notified MD of ongoing situation. Also notified MD that pt keeps saying "y'll need to help me, I need to go back to ICU." Updated recent vitals, BP 145/92, o2 sat 95% in 3l o2 via [place], HR 110 sinus, temp  98.2. MD said try phenergan 12.5 mg iv and if that doesn't work, next thing is NG tube. Call bell within reach. Will continue to monitor.

## 2020-06-05 NOTE — Plan of Care (Signed)
  Problem: Education: Goal: Knowledge of General Education information will improve Description: Including pain rating scale, medication(s)/side effects and non-pharmacologic comfort measures Outcome: Progressing   Problem: Activity: Goal: Risk for activity intolerance will decrease Outcome: Progressing   Problem: Nutrition: Goal: Adequate nutrition will be maintained Outcome: Progressing   Problem: Coping: Goal: Level of anxiety will decrease Outcome: Progressing   

## 2020-06-06 ENCOUNTER — Inpatient Hospital Stay (HOSPITAL_COMMUNITY): Payer: BC Managed Care – PPO

## 2020-06-06 ENCOUNTER — Encounter (HOSPITAL_COMMUNITY): Payer: Self-pay | Admitting: Thoracic Surgery (Cardiothoracic Vascular Surgery)

## 2020-06-06 HISTORY — PX: IR THORACENTESIS ASP PLEURAL SPACE W/IMG GUIDE: IMG5380

## 2020-06-06 IMAGING — DX DG CHEST 2V
2 series · 2 of 2 positions shown · non-contrast
Comparison: Chest radiographs [DATE] and earlier.

CLINICAL DATA: 69-year-old male status post CABG postoperative day
4.

EXAM:
CHEST - 2 VIEW

[chest lat]
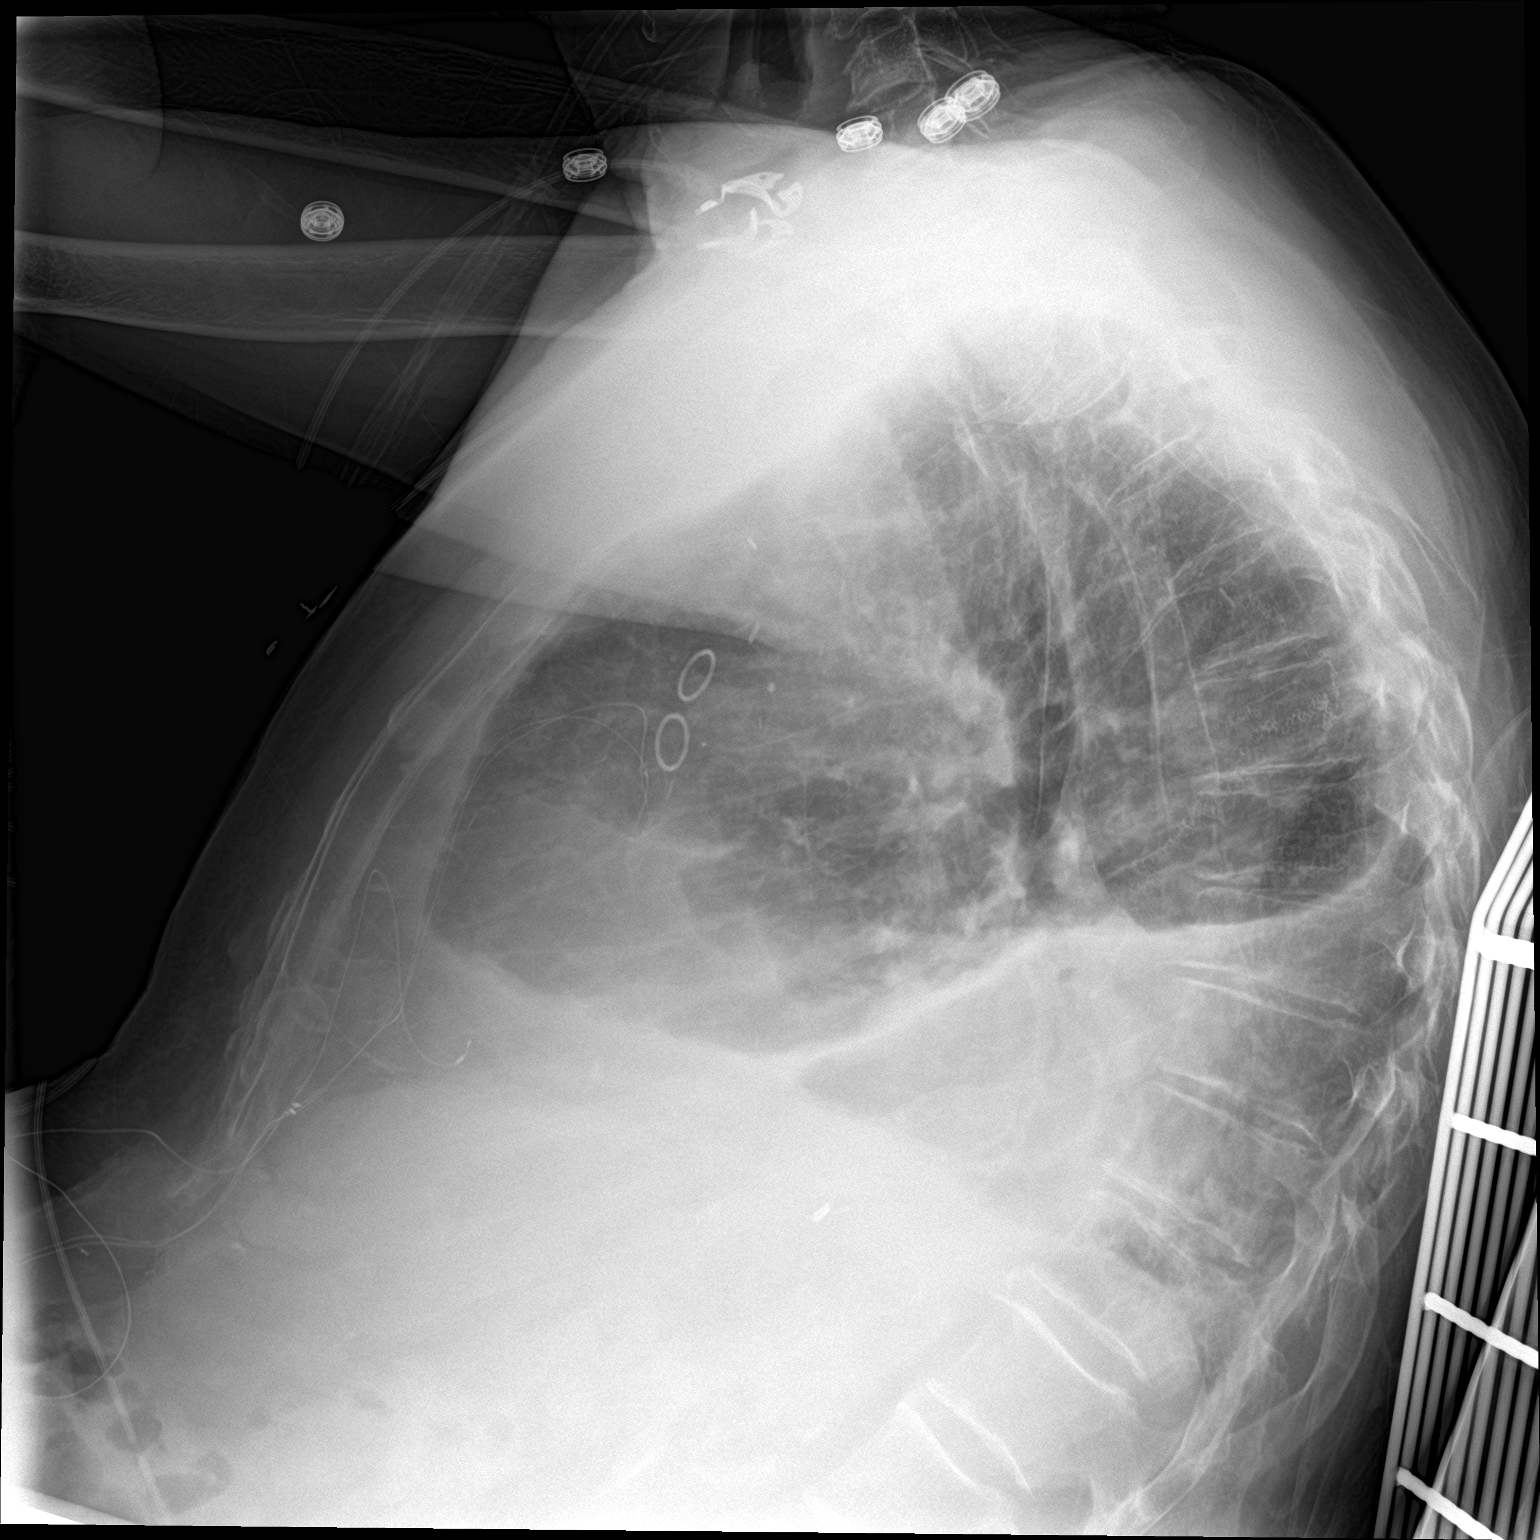

[chest ap]
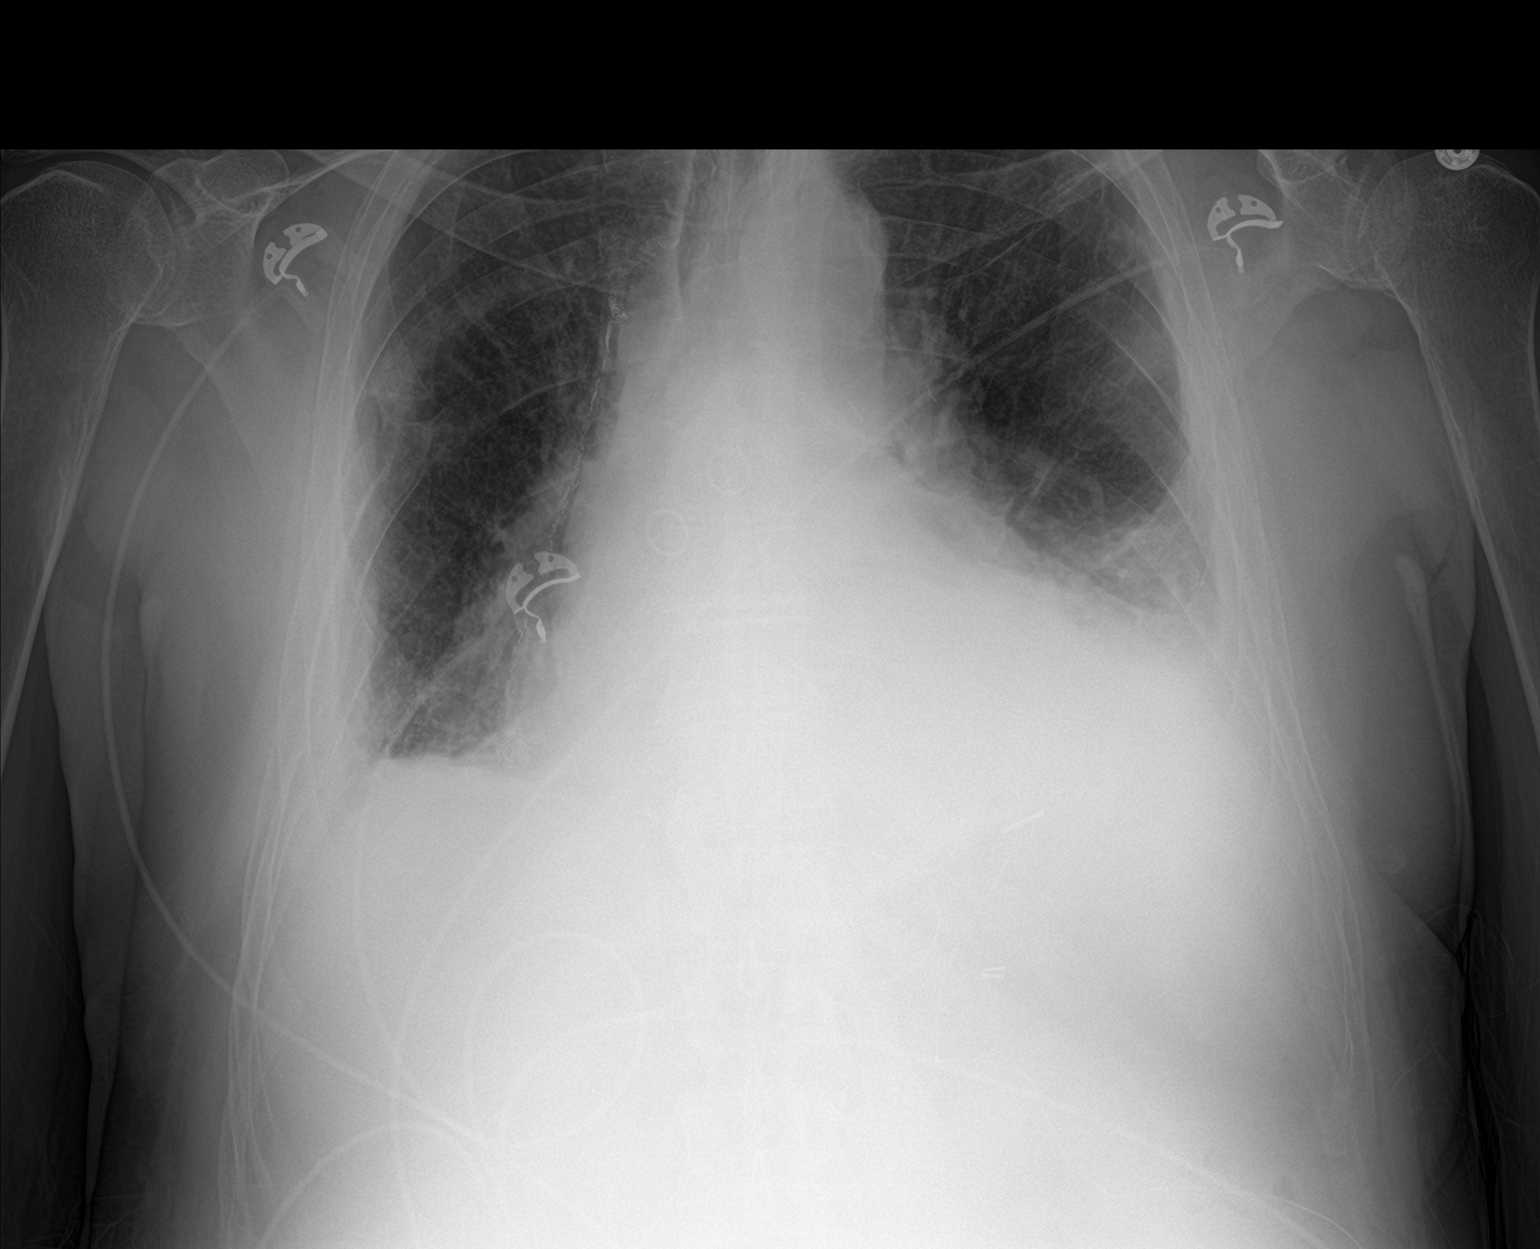

[2 of 2 positions shown; findings below may reference images not displayed]

FINDINGS: Semi upright AP and lateral views of the chest today. Right IJ
introducer sheath removed. Epicardial pacer wires remain. Chronic
right rib fractures. Stable cardiac size and mediastinal contours.
Previous gastric pull-through.

No pneumothorax. Decreased pulmonary vascularity with no overt
edema. Moderate left pleural effusion. Chronic right costophrenic
angle blunting is stable.

Stable visualized osseous structures. Paucity of bowel gas in the
upper abdomen.
IMPRESSION: 1. Right IJ introducer sheath removed.
2. Moderate size left pleural effusion with lung base
hypo-ventilation. Chronic pleural thickening suspected at the right
costophrenic angle.

## 2020-06-06 IMAGING — DX DG CHEST 1V
2 series · 2 of 2 positions shown · non-contrast
Comparison: [DATE], [DATE] a.m.

CLINICAL DATA: Pleural effusion, recent CABG, status post
thoracentesis

EXAM:
CHEST  1 VIEW

[w chest lat]
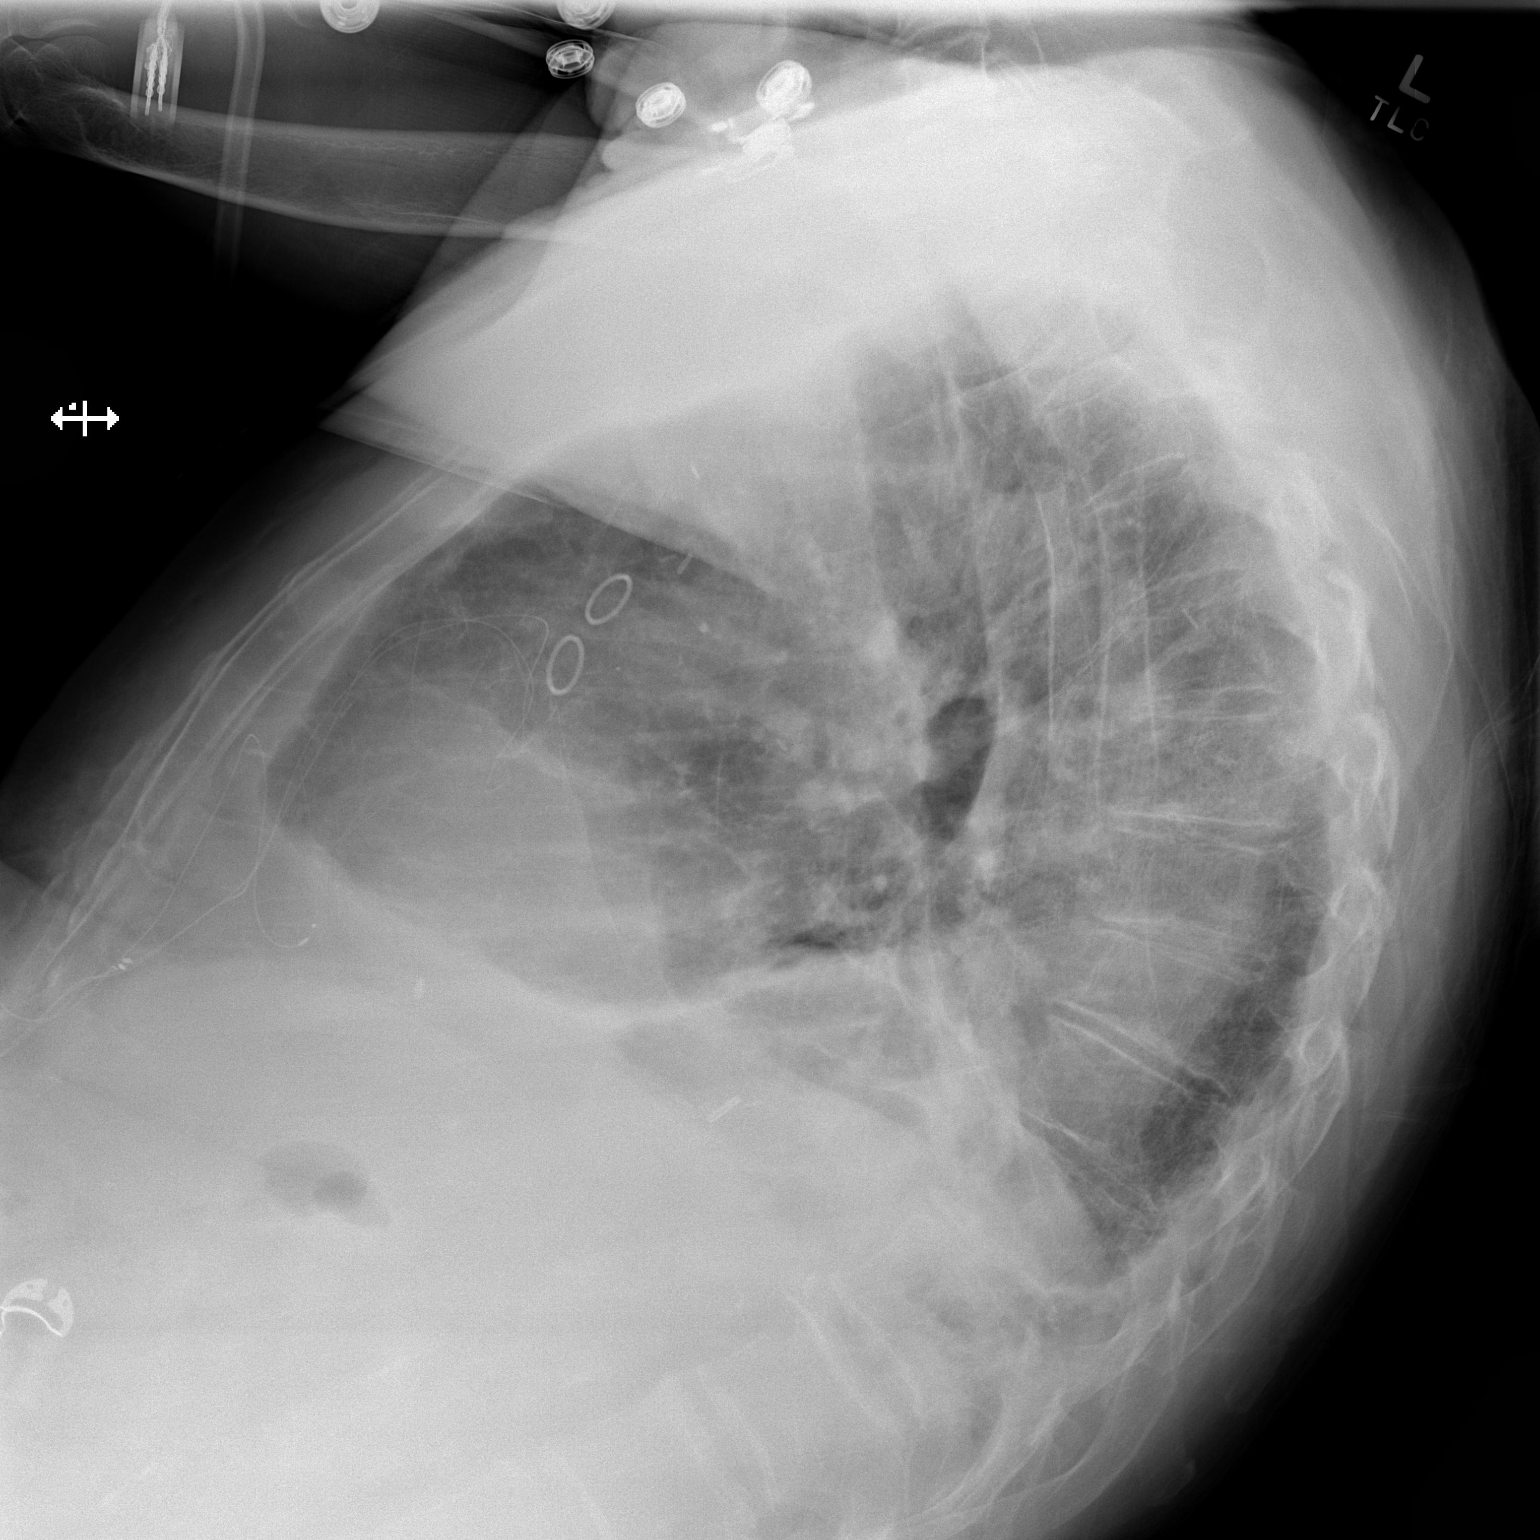

[x chest ap]
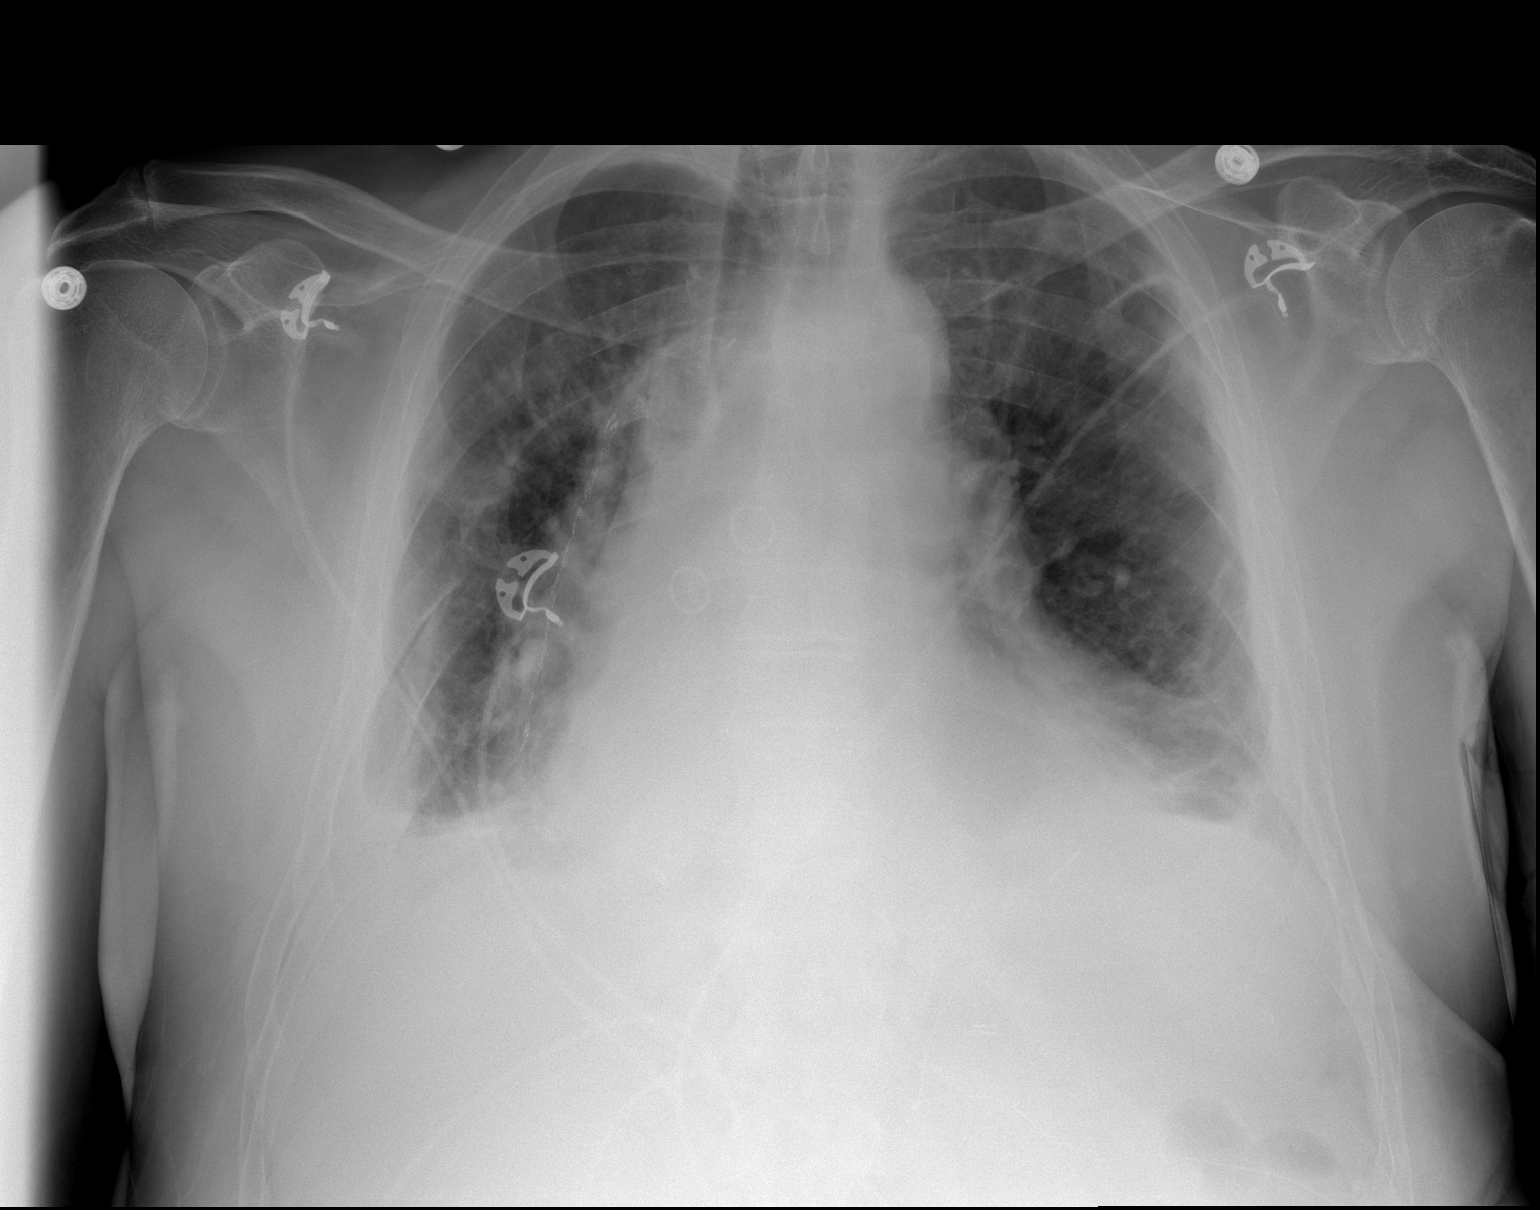

[2 of 2 positions shown; findings below may reference images not displayed]

FINDINGS: Interval reduction in volume of a left pleural effusion, now small,
with associated atelectasis or consolidation. No pneumothorax.
Unchanged small right pleural effusion. Cardiomegaly status post
median sternotomy and CABG.
IMPRESSION: Interval reduction in volume of a left pleural effusion, now small,
with associated atelectasis or consolidation. No pneumothorax.

## 2020-06-06 IMAGING — US IR THORACENTESIS ASP PLEURAL SPACE W/IMG GUIDE
1 series · 6 of 6 positions shown · non-contrast
Comparison: none

INDICATION: Recent CABG, shortness of breast and left pleural effusion seen on
CXR. Request for therapeutic thoracentesis.

[Series 1: ir (id) (id)/(id)/(id) ir · 6 of 6 slices shown]
[im 1/6]
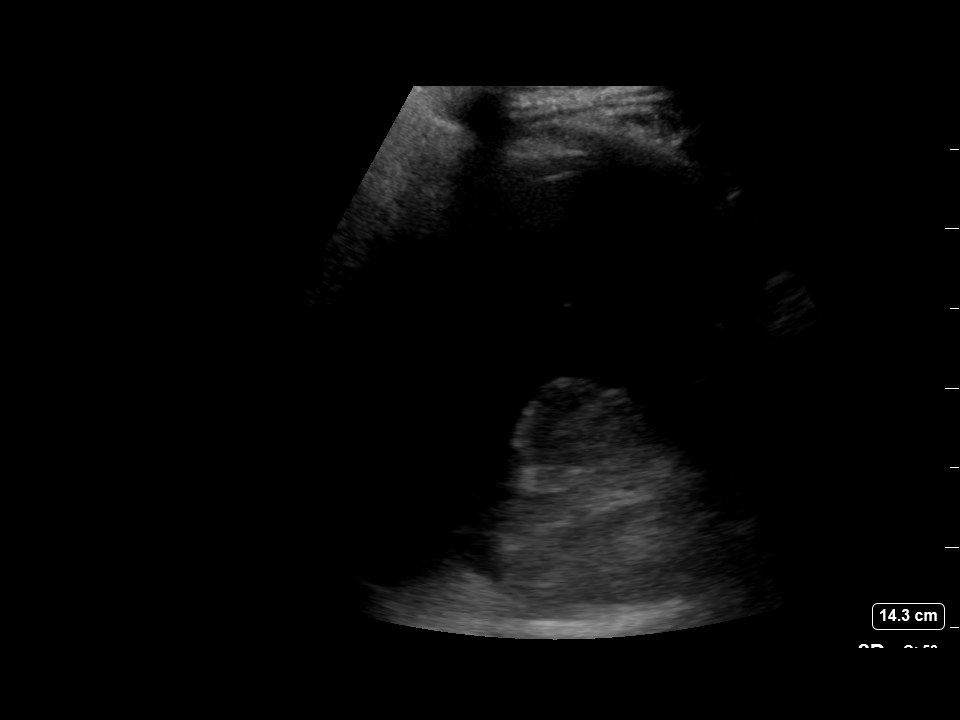
[im 2/6]
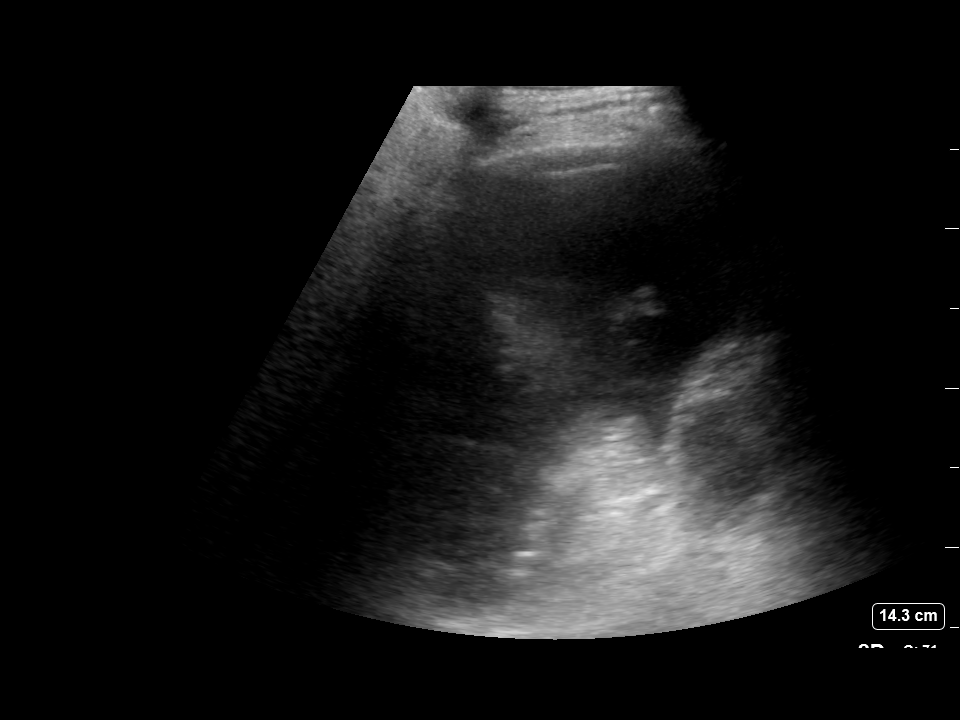
[im 3/6]
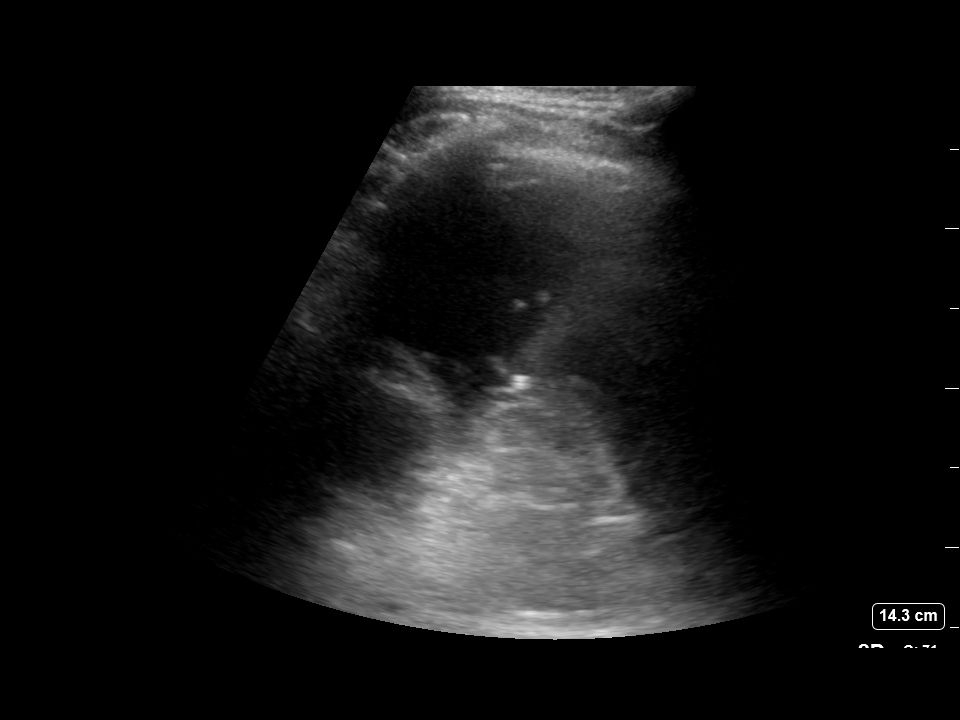
[im 4/6]
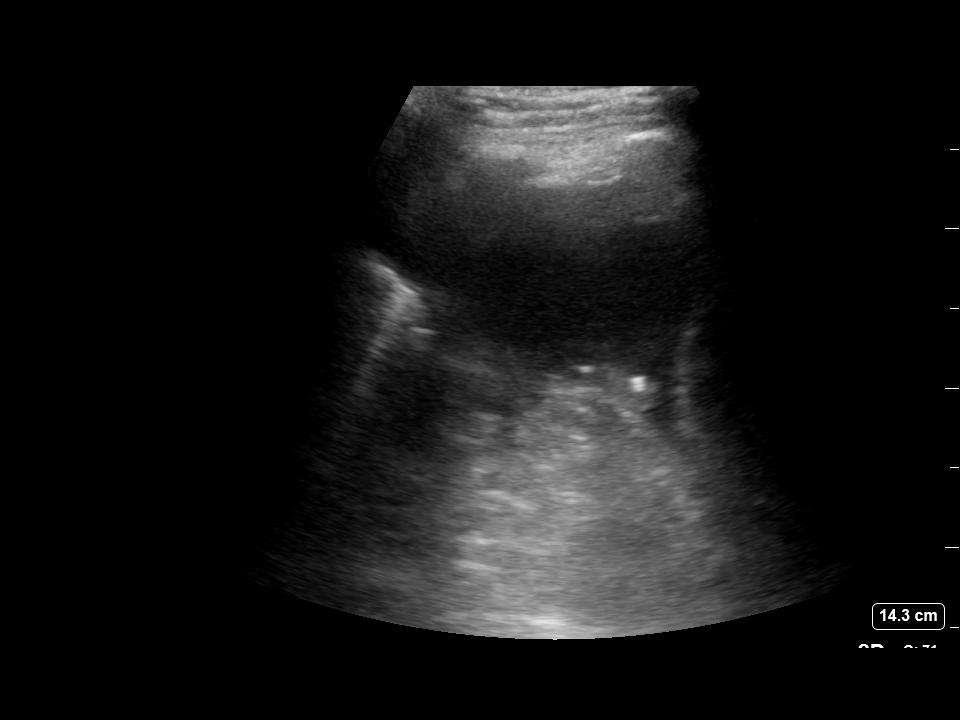
[im 5/6]
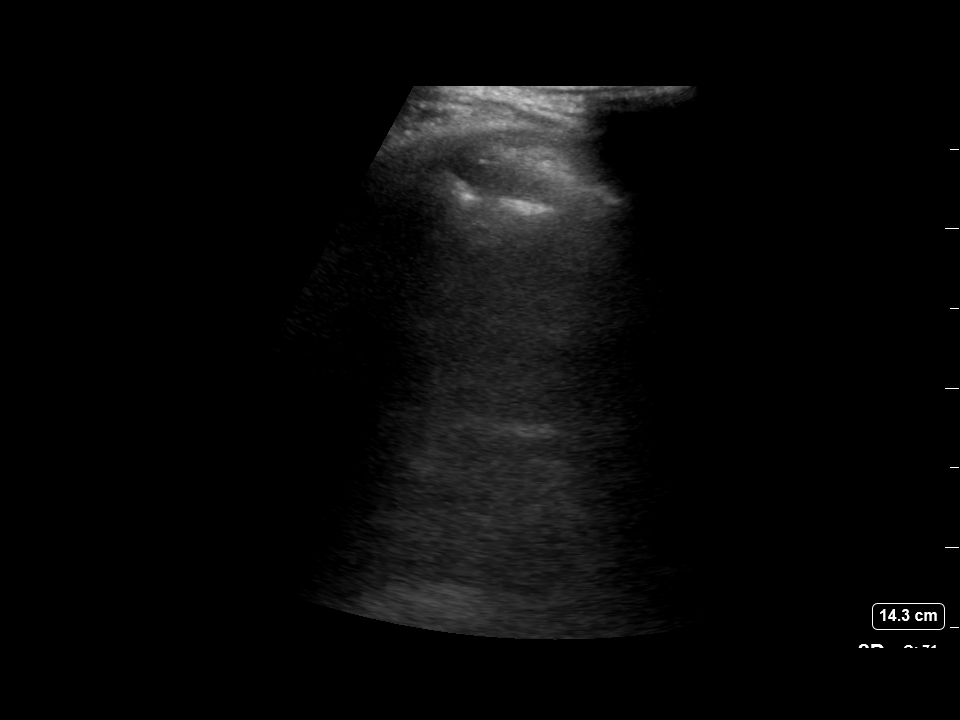
[im 6/6]
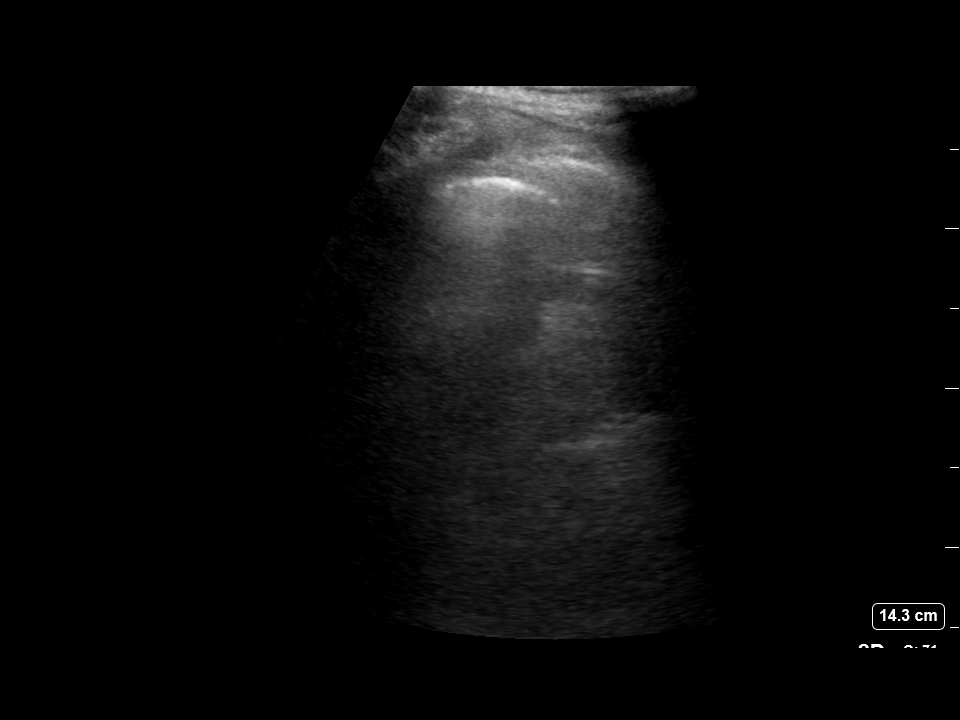

[6 of 6 positions shown; findings below may reference images not displayed]

EXAM:
ULTRASOUND GUIDED LEFT THORACENTESIS

MEDICATIONS:
15 mL 1% lidocaine

COMPLICATIONS:
None immediate.

PROCEDURE:
An ultrasound guided thoracentesis was thoroughly discussed with the
patient and questions answered. The benefits, risks, alternatives
and complications were also discussed. The patient understands and
wishes to proceed with the procedure. Written consent was obtained.

Ultrasound was performed to localize and mark an adequate pocket of
fluid in the left chest. The area was then prepped and draped in the
normal sterile fashion. 1% Lidocaine was used for local anesthesia.
Under ultrasound guidance a 6 Fr Safe-T-Centesis catheter was
introduced. Thoracentesis was performed. The catheter was removed
and a dressing applied.
FINDINGS: A total of approximately 700 cc of bloody fluid was removed.

Post procedure chest X-ray ordered.
IMPRESSION: Successful ultrasound guided left thoracentesis yielding 700 cc of
pleural fluid.

## 2020-06-06 MED ORDER — LIDOCAINE HCL (PF) 1 % IJ SOLN
INTRAMUSCULAR | Status: DC | PRN
  Administered 2020-06-06: 10 mL

## 2020-06-06 MED ORDER — KETOROLAC TROMETHAMINE 15 MG/ML IJ SOLN
15.0000 mg | Freq: Once | INTRAMUSCULAR | Status: AC
  Administered 2020-06-06: 15 mg via INTRAVENOUS

## 2020-06-06 MED ORDER — METOPROLOL TARTRATE 50 MG PO TABS
50.0000 mg | ORAL_TABLET | Freq: Two times a day (BID) | ORAL | Status: DC
  Administered 2020-06-06 – 2020-06-07 (×3): 50 mg via ORAL
  Filled 2020-06-06 (×3): qty 1

## 2020-06-06 MED ORDER — CLOPIDOGREL BISULFATE 75 MG PO TABS
75.0000 mg | ORAL_TABLET | Freq: Every day | ORAL | Status: DC
  Administered 2020-06-07 – 2020-06-08 (×2): 75 mg via ORAL
  Filled 2020-06-06 (×2): qty 1

## 2020-06-06 MED ORDER — KETOROLAC TROMETHAMINE 15 MG/ML IJ SOLN
INTRAMUSCULAR | Status: AC
  Filled 2020-06-06: qty 1

## 2020-06-06 MED ORDER — IRBESARTAN 150 MG PO TABS
75.0000 mg | ORAL_TABLET | Freq: Every day | ORAL | Status: DC
  Administered 2020-06-06 – 2020-06-08 (×3): 75 mg via ORAL
  Filled 2020-06-06 (×3): qty 1

## 2020-06-06 MED ORDER — ASPIRIN EC 81 MG PO TBEC
81.0000 mg | DELAYED_RELEASE_TABLET | Freq: Every day | ORAL | Status: DC
  Administered 2020-06-07 – 2020-06-08 (×2): 81 mg via ORAL
  Filled 2020-06-06 (×2): qty 1

## 2020-06-06 MED ORDER — LIDOCAINE HCL 1 % IJ SOLN
INTRAMUSCULAR | Status: AC
  Filled 2020-06-06: qty 20

## 2020-06-06 NOTE — Progress Notes (Signed)
Mobility Specialist: Progress Note   06/06/20 1716  Mobility  Activity Refused mobility   Pt refused mobility due to chest and back pain, RN aware. Encouraged pt to walk with staff if his pain eases up. Will f/u tomorrow.   [facility] Fl[facility] Kingdon Mobility Specialist Mobility Specialist Phone: [phone]

## 2020-06-06 NOTE — Procedures (Signed)
PROCEDURE SUMMARY:  Successful image-guided left thoracentesis. Yielded 700 cc of bloody fluid. Pt tolerated procedure well. No immediate complications. EBL = trace   Specimen was not sent for labs. CXR ordered.  Please see imaging section of Epic for full dictation.  Armando Gang Naome Brigandi PA-C 06/06/2020 2:39 PM

## 2020-06-06 NOTE — Addendum Note (Signed)
Addendum  created 06/06/20 0738 by Wilburn Cornelia, CRNA   Order list changed

## 2020-06-06 NOTE — Progress Notes (Signed)
EPWs removed per protocol.  Patient tolerated well.  Instructed on 1hr of bed rest with BP monitoring.  Will continue to monitor.

## 2020-06-06 NOTE — Progress Notes (Signed)
CARDIAC REHAB PHASE I   PRE:  Rate/Rhythm: 98 SR    BP: sitting 149/92    SaO2: 95 1L  MODE:  Ambulation: 330 ft   POST:  Rate/Rhythm: 110 ST    BP: sitting 154/89     SaO2: 95 1L  Pt moving well with correct mechanics. Steady with RW, slow. SaO2 89 RA in hall. Applied O2 when pt felt fatigued at 100 ft and encouraged him to increase distance. To EOB for bath. Encouraged x2 more walks, IS. He did practice IS with average 300 ml. Encouraged recliner after bath. [phone]   [place], ACSM 06/06/2020 9:39 AM

## 2020-06-06 NOTE — Progress Notes (Addendum)
      [address]       [place] [address]             [phone]      4 Days Post-Op Procedure(s) (LRB): CORONARY ARTERY BYPASS GRAFTING (CABG) TIMES THREE USING RIGHT GREATER SAPHENOUS VEIN HARVESTED ENDOSCOPICALLY AND LEFT INTERNAL MAMMARY ARTERY. (N/A)   Subjective:  No new complaints.  Denies further nausea/vomiting.  He has not moved his bowels yet.  Remains on oxygen.  Objective: Vital signs in last 24 hours: Temp:  [97.8 F (36.6 C)-98.6 F (37 C)] 97.8 F (36.6 C) (05/31 0353) Pulse Rate:  [98-103] 100 (05/31 0353) Cardiac Rhythm: Normal sinus rhythm (05/30 1900) Resp:  [18-20] 20 (05/31 0353) BP: (136-151)/(84-96) 138/95 (05/31 0353) SpO2:  [96 %-99 %] 98 % (05/31 0353) Weight:  [79.5 kg-85.8 kg] 79.5 kg (05/31 0353)  Intake/Output from previous day: 05/30 0701 - 05/31 0700 In: 1209.8 [P.O.:280; I.V.:879.8; IV Piggyback:50] Out: 1443 [Urine:1680; Emesis/NG output:1]  General appearance: alert, cooperative and no distress Heart: regular rate and rhythm Lungs: diminished breath sounds left base Abdomen: soft non-distended, hypoactive BS Extremities: edema trace Wound: clean and dry  Lab Results: Recent Labs    06/04/20 0450 06/05/20 0305  WBC 12.5* 5.3  HGB 8.5* 8.7*  HCT 26.0* 27.3*  PLT 149* 185   BMET:  Recent Labs    06/04/20 0450 06/05/20 0027  NA 137 138  K 3.8 5.0  CL 103 106  CO2 29 23  GLUCOSE 88 114*  BUN 18 21  CREATININE 1.33* 1.25*  CALCIUM 7.4* 7.7*    PT/INR: No results for input(s): LABPROT, INR in the last 72 hours. ABG    Component Value Date/Time   PHART 7.342 (L) 06/02/2020 1902   HCO3 24.0 06/02/2020 1902   TCO2 25 06/02/2020 1902   ACIDBASEDEF 2.0 06/02/2020 1902   O2SAT 99.0 06/02/2020 1902   CBG (last 3)  Recent Labs    06/04/20 1614 06/04/20 2118 06/05/20 0624  GLUCAP 113* 130* 123*    Assessment/Plan: S/P Procedure(s) (LRB): CORONARY ARTERY BYPASS GRAFTING (CABG) TIMES THREE USING  RIGHT GREATER SAPHENOUS VEIN HARVESTED ENDOSCOPICALLY AND LEFT INTERNAL MAMMARY ARTERY. (N/A)  1. CV- mild Sinus Tachycardia, + HTN- on Lopressor 25 mg BID, will restart home Micardis 2. Pulm- left pleural effusion, difficulty weaning oxygen, will send for Thoracentesis, continue to wean oxygen as tolerated 3. Renal- creatinine stable, K is at 5.0, IV fluids running for now with nausea/vomiting, which has since resolved, no currently on Lasix 4. GI- N/V resolved, hypoactive BS on exam, not yet moved bowels, continue reglan, prn stool softners/laxative 5. Dispo- patient stable, mild sinus tach, continue Lopressor, restart home Micardis for additional BP control, moderate left pleural effusion, will send for thoracentesis today, watch GI symptoms, N/V resolved, hypoactive BS, continue current care   LOS: 7 days   Ellwood Handler, PA-C 06/06/2020    I have seen and examined the patient and agree with the assessment and plan as outlined.  Rexene Alberts, MD 06/06/2020 2:56 PM

## 2020-06-07 ENCOUNTER — Inpatient Hospital Stay (HOSPITAL_COMMUNITY): Payer: BC Managed Care – PPO

## 2020-06-07 LAB — BASIC METABOLIC PANEL
Anion gap: 11 (ref 5–15)
BUN: 20 mg/dL (ref 8–23)
CO2: 25 mmol/L (ref 22–32)
Calcium: 8.7 mg/dL — ABNORMAL LOW (ref 8.9–10.3)
Chloride: 102 mmol/L (ref 98–111)
Creatinine, Ser: 1.26 mg/dL — ABNORMAL HIGH (ref 0.61–1.24)
GFR, Estimated: >60 mL/min (ref >60)
Glucose, Bld: 123 mg/dL — ABNORMAL HIGH (ref 70–99)
Potassium: 3.9 mmol/L (ref 3.5–5.1)
Sodium: 138 mmol/L (ref 135–145)

## 2020-06-07 LAB — CBC
HCT: 30 % — ABNORMAL LOW (ref 39.0–52.0)
Hemoglobin: 9.5 g/dL — ABNORMAL LOW (ref 13.0–17.0)
MCH: 29.1 pg (ref 26.0–34.0)
MCHC: 31.7 g/dL (ref 30.0–36.0)
MCV: 91.7 fL (ref 80.0–100.0)
Platelets: 328 10*3/uL (ref 150–400)
RBC: 3.27 MIL/uL — ABNORMAL LOW (ref 4.22–5.81)
RDW: 14.4 % (ref 11.5–15.5)
WBC: 11.9 10*3/uL — ABNORMAL HIGH (ref 4.0–10.5)
nRBC: 0 % (ref 0.0–0.2)

## 2020-06-07 IMAGING — DX DG CHEST 2V
2 series · 2 of 2 positions shown · non-contrast
Comparison: [DATE].  [DATE].

CLINICAL DATA: CABG.

EXAM:
CHEST - 2 VIEW

[chest pa]
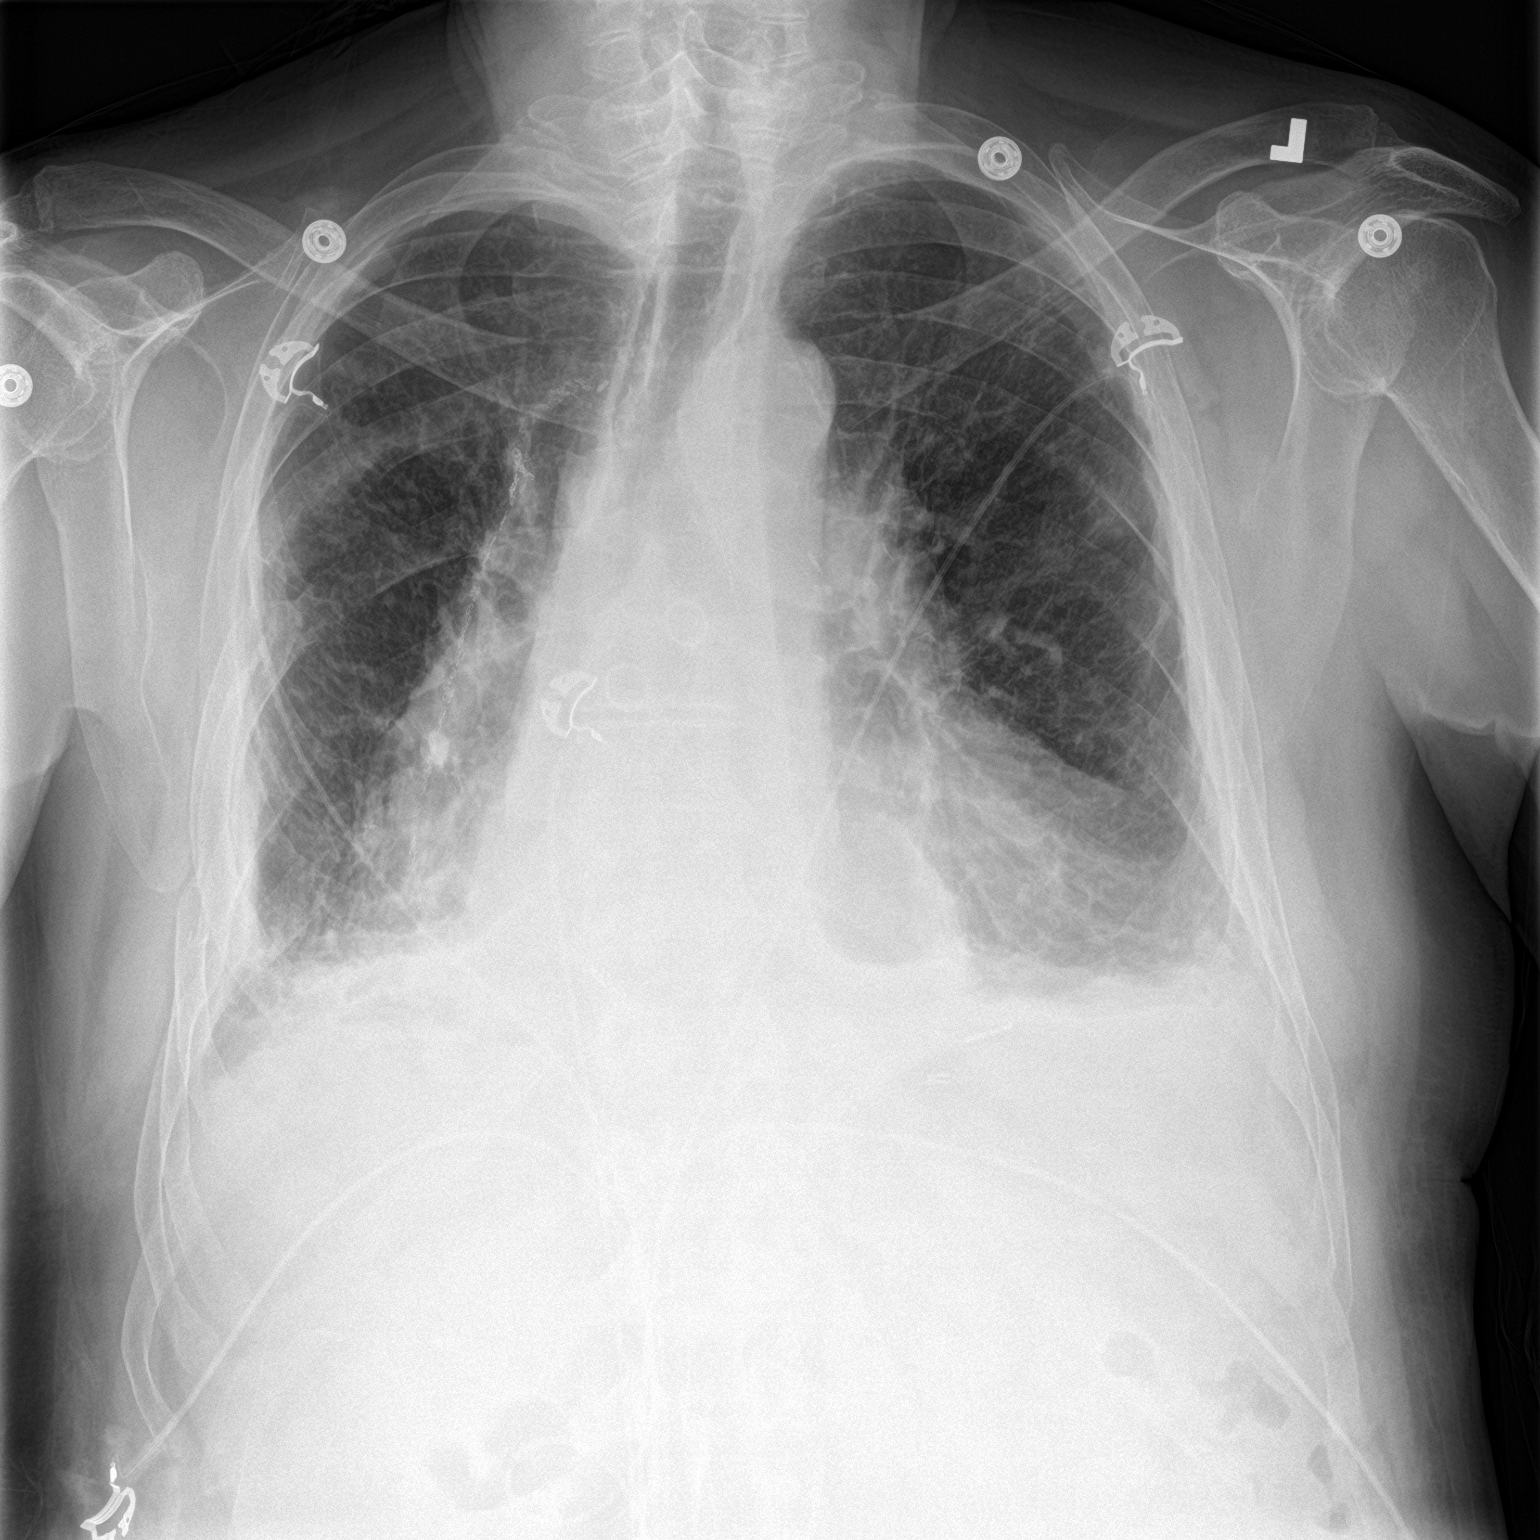

[chest lat]
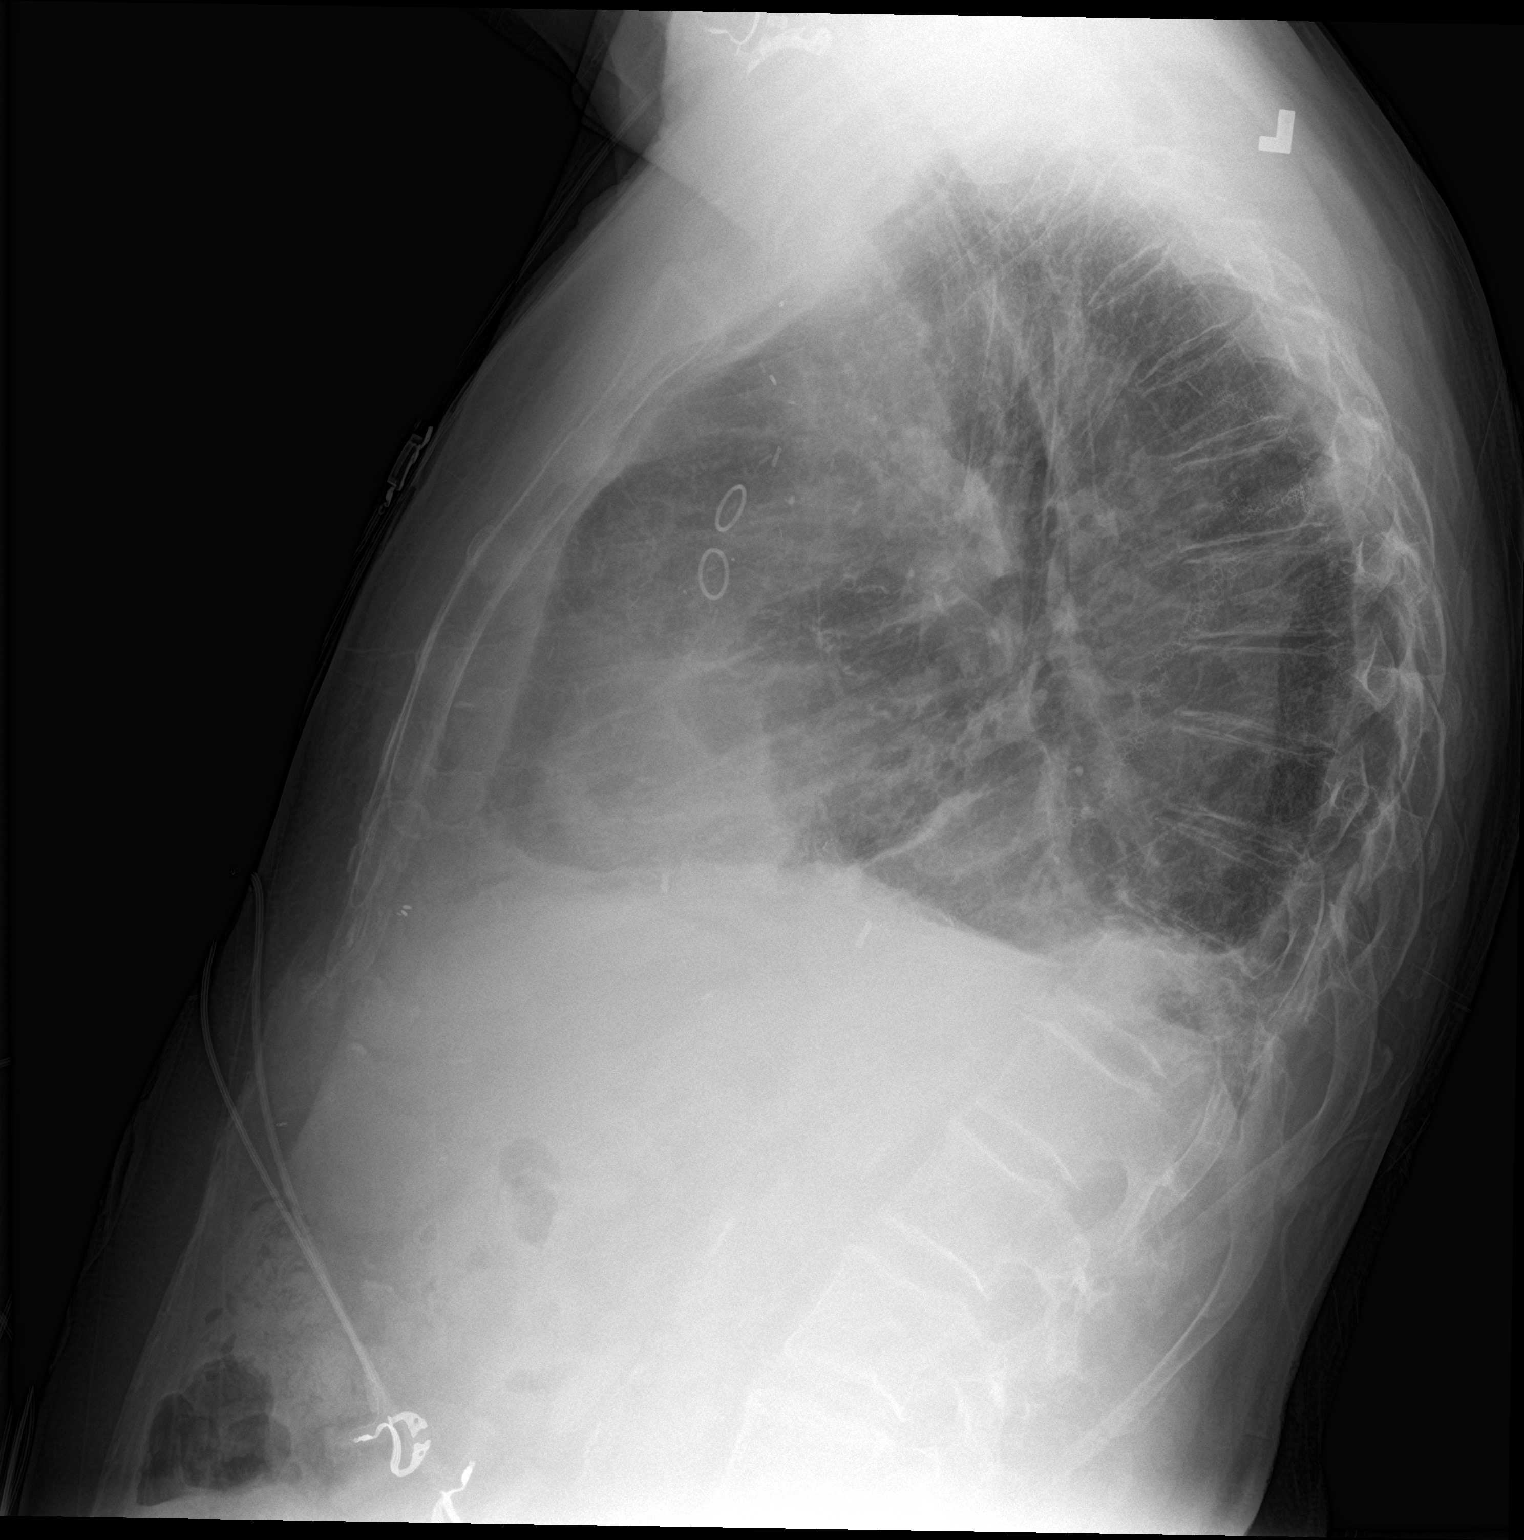

[2 of 2 positions shown; findings below may reference images not displayed]

FINDINGS: Mediastinum and hilar structures normal. Prior CABG. Cardiomegaly.
No pulmonary venous congestion. Postsurgical changes right lung.
Persistent bibasilar atelectasis and or scarring. Tiny bilateral
pleural effusions versus pleural scarring again noted. Old right rib
fractures again noted. No pneumothorax.
IMPRESSION: 1.  Prior CABG.  Stable cardiomegaly.

2. Postsurgical changes right lung. Persistent bibasilar and or
scarring. Persistent small bilateral pleural effusions and or
scarring. Chest is unchanged from prior exam.

## 2020-06-07 MED ORDER — POLYETHYLENE GLYCOL 3350 17 G PO PACK
17.0000 g | PACK | Freq: Every day | ORAL | Status: DC
  Administered 2020-06-07: 17 g via ORAL
  Filled 2020-06-07: qty 1

## 2020-06-07 MED ORDER — GUAIFENESIN 100 MG/5ML PO SOLN
10.0000 mL | Freq: Four times a day (QID) | ORAL | Status: DC | PRN
  Administered 2020-06-07 – 2020-06-08 (×2): 200 mg via ORAL
  Filled 2020-06-07 (×2): qty 10

## 2020-06-07 MED ORDER — MAGNESIUM CITRATE PO SOLN: 0.5000 | Freq: Once | ORAL | Status: DC | PRN

## 2020-06-07 MED FILL — Sodium Bicarbonate IV Soln 8.4%: INTRAVENOUS | Qty: 50 | Status: AC

## 2020-06-07 MED FILL — Sodium Chloride IV Soln 0.9%: INTRAVENOUS | Qty: 3000 | Status: AC

## 2020-06-07 MED FILL — Lidocaine HCl Local Soln Prefilled Syringe 100 MG/5ML (2%): INTRAMUSCULAR | Qty: 5 | Status: AC

## 2020-06-07 MED FILL — Heparin Sodium (Porcine) Inj 1000 Unit/ML: INTRAMUSCULAR | Qty: 30 | Status: AC

## 2020-06-07 MED FILL — Magnesium Sulfate Inj 50%: INTRAMUSCULAR | Qty: 10 | Status: AC

## 2020-06-07 MED FILL — Potassium Chloride Inj 2 mEq/ML: INTRAVENOUS | Qty: 40 | Status: AC

## 2020-06-07 MED FILL — Mannitol IV Soln 20%: INTRAVENOUS | Qty: 500 | Status: AC

## 2020-06-07 MED FILL — Heparin Sodium (Porcine) Inj 1000 Unit/ML: INTRAMUSCULAR | Qty: 20 | Status: AC

## 2020-06-07 MED FILL — Electrolyte-R (PH 7.4) Solution: INTRAVENOUS | Qty: 4000 | Status: AC

## 2020-06-07 NOTE — Progress Notes (Signed)
CARDIAC REHAB PHASE I   PRE:  Rate/Rhythm: 99 SR    BP: sitting 138/84    SaO2: 91 RA  MODE:  Ambulation: 470 ft   POST:  Rate/Rhythm: 108 ST    BP: sitting 127/78     SaO2: 96 RA  Pt out of bed with min assist and verbal reminders. Ambulated without AD today, min assist. Some sway. Increased distance, no O2 needed. To recliner. Encouraged x2 more walks and more IS. Still only inspiring 300 ml.  [id number]  [place], ACSM 06/07/2020 10:56 AM

## 2020-06-07 NOTE — Discharge Summary (Signed)
Physician Discharge Summary  Patient ID: James Wolf MRN: [id number] DOB/AGE: Jun 06, 1950 70 y.o.  Admit date: 05/29/2020 Discharge date: 06/08/2020  Admission Diagnoses:  Acute NSTEMI Hypertension Dyslipidemia COPD  Discharge Diagnoses:   Essential hypertension COPD (chronic obstructive pulmonary disease) (HCC) Aspiration pneumonitis (HCC) Leukocytosis Hypokalemia Coronary artery disease involving native coronary artery of native heart with unstable angina pectoris (HCC)   Non-ST elevation (NSTEMI) myocardial infarction (HCC) S/P CABG x 3   Left main coronary artery disease   Hyperlipidemia with target LDL less than 70   Discharged Condition: stable  History of Present Illness:  Patient is a 70 year old male with no previous history of coronary artery disease but risk factors notable for history of hypertension, hyperlipidemia, and remote history of tobacco use who presented with a several day history of classical symptoms of unstable angina, had weakly positive troponin levels consistent with non-ST segment elevation myocardial infarction. 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  He did not require adjuvant chemotherapy or radiation therapy and he has done well ever since.  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    Patient was in his usual state of health until recently when he underwent surgical repair of left rotator cuff injury several months ago.  He has been going through rehab since then.  4 days ago patient was doing some exercises with a dumbbell and developed burning substernal chest pain radiating across his left chest to the left shoulder and to his neck.  He initially blamed it on his recent shoulder surgery and symptoms resolved with rest.  The  following day he was lifting something heavy and similar symptoms occurred and subsided with rest.  Later that night he had a prolonged episode of severe substernal chest pain that occurred at rest, prompting him to go to the emergency department in [place].  There by report Baseline EKG was unremarkable but serial troponin levels were positive.  The patient left the hospital AGAINST MEDICAL ADVICE and the following day went to [facility] for further evaluation.  The patient was transferred to [facility] where he underwent diagnostic cardiac catheterization by Dr. 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  Left ventricular function is preserved.  Cardiothoracic surgical consultation was requested.  He was evaluated by Dr. Roxy Manns who felt the patient would benefit from coronary bypass grafting procedure.  The risks and benefits of the procedure and he was agreeable to proceed.  Hospital Course:  He remained chest pain free prior to surgery.  He was taken to the operating room and underwent CABG x 3 utilizing LIMA to LAD, SVG to OM, and SVG to Distal RCA.  He also underwent endoscopic harvest of greater saphenous vein from his right thigh to below the knee.  He tolerated the procedure without difficulty and was taken to the SICU in stable condition. He was extubated the evening of surgery without difficulty. He was started on Lopressor, ec asa, and a statin. He was weaned off the Insulin drip. His pre op HGA1C is 6.2. 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 Nutritional information will be provided with his discharge paperwork. He will need further surveillance by his medical doctor after discharge. He had expected post op blood loss anemia. He did not require a post op transfusion. Last H and  H was 9.5 / 30. He was volume overloaded and diuresed. He was felt surgically stable for transfer from the ICU to 4E for further convalescence on 06/04/2020. He had nausea and  vomiting on 05/30. He was given IVF, diuresis held, given Zofran and Reglan, and narcotics were limited as well. He was still requiring several liters of oxygen via [place] post op.  CXR was obtained and showed a moderate left sided pleural effusion.  He was referred for thoracentesis.  This was performed on 06/06/2020 and removed 732ml of fluid.  Following this, the CXR was satisfactory and he was quickly weaned from the supplemental O2. His wounds are clean, dry, and healing without signs of infection.  Consults: None  Significant Diagnostic Studies:   EXAM: CHEST - 2 VIEW  COMPARISON:  06/06/2020.  11/03/2018.  FINDINGS: Mediastinum and hilar structures normal. Prior CABG. Cardiomegaly. No pulmonary venous congestion. Postsurgical changes right lung. Persistent bibasilar atelectasis and or scarring. Tiny bilateral pleural effusions versus pleural scarring again noted. Old right rib fractures again noted. No pneumothorax.  IMPRESSION: 1.  Prior CABG.  Stable cardiomegaly.  2. Postsurgical changes right lung. Persistent bibasilar and or scarring. Persistent small bilateral pleural effusions and or scarring. Chest is unchanged from prior exam.   Electronically Signed   By: Marcello Moores  Register   On: 06/07/2020 07:12   Treatments:   CARDIOTHORACIC SURGERY OPERATIVE NOTE  Date of Procedure:    06/02/2020  Preoperative Diagnosis:        Severe 3-vessel Coronary Artery Disease  Unstable Angina  Postoperative Diagnosis:    Same  Procedure:        Coronary Artery Bypass Grafting x 3              Left Internal Mammary Artery to Distal Left Anterior Descending Coronary Artery             Saphenous Vein Graft to Distal Right Coronary Artery             Saphenous Vein Graft to Obtuse Marginal Branch of Left Circumflex Coronary Artery             Endoscopic Vein Harvest from Right Thigh and Lower Leg  Surgeon:        Valentina Gu. Roxy Manns, MD  Assistant:       Ellwood Handler,  PA-C  Anesthesia:    Midge Minium, MD  Operative Findings: ? Normal left ventricular systolic function ? Good quality left internal mammary artery conduit ? Good quality saphenous vein conduit ? Good quality target vessels for grafting    BRIEF CLINICAL NOTE AND INDICATIONS FOR SURGERY  Patient is a 70 year old male with no previous history of coronary artery disease but risk factors notable for history of hypertension, hyperlipidemia, and remote history of tobacco use who presented with a several day history of classical symptoms of unstable angina, had weakly positive troponin levels consistent with non-ST segment elevation myocardial infarction and has been referred for surgical consultation to discuss treatment options for management of 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 He did not require adjuvant chemotherapy or radiation therapy and he has done well ever since. 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  Patient was in his usual state of health until recently when he underwent surgical repair of left rotator cuff injury several months ago. He has been going through rehab since then. 4 days ago patient was doing some exercises with a dumbbell and developed burning substernal chest pain radiating across his left chest to the left shoulder and to his neck. He initially blamed it on his recent shoulder surgery and symptoms resolved with rest.  The following day he was lifting something heavy and similar symptoms occurred and subsided with rest. Later that night he had a prolonged episode of severe substernal chest pain that occurred at rest, prompting him to go to the emergency department in [place]. There by report Baseline EKG was unremarkable but serial troponin levels were positive. The patient left the hospital AGAINST MEDICAL ADVICE and the following day went to [facility] for further evaluation. The patient was transferred to [facility] where he underwent diagnostic cardiac catheterization by Dr. 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 Left ventricular function is preserved. Cardiothoracic surgical consultation was requested.   The patient has been seen in consultation and counseled at length regarding the indications, risks and potential benefits of surgery.  All questions have been answered, and the patient provides full informed consent for the operation as described.  Discharge Exam: Blood pressure (!) 141/98, pulse (!) 117, temperature 98.5 F (36.9 C), temperature source Oral, resp. rate (!) 26, height 5' 5.35" (1.66 m), weight 75.8 kg, SpO2 95 %.  General appearance: alert, cooperative and no distress Heart: sinus tachycardia ~110-120 past 24 hours.  Lungs:  breath sounds clear Abdomen: soft non-distended Extremities: no edema Wounds: clean and dry  Disposition: Discharged to home in stable condition.   Allergies as of 06/08/2020      Reactions   Zithromax [azithromycin] Diarrhea   Causes pt's stomach to "tear up" does not want to take medication again.       Medication List    STOP taking these medications   ibuprofen 800 MG tablet Commonly known as: ADVIL     TAKE these medications   albuterol 108 (90 Base) MCG/ACT inhaler Commonly known as: VENTOLIN HFA Inhale 1-2 puffs into the lungs every 6 (six) hours as needed for wheezing or shortness  of breath.   aspirin 81 MG EC tablet Take 1 tablet (81 mg total) by mouth daily. Swallow whole.   cholecalciferol 25 MCG (1000 UNIT) tablet Commonly known as: VITAMIN D3 Take 1,000 Units by mouth daily.   clopidogrel 75 MG tablet Commonly known as: PLAVIX Take 1 tablet (75 mg total) by mouth daily.   [facility] Family Education Misc 1 each by Does not apply route once for 1 dose.   diclofenac sodium 1 % Gel Commonly known as: VOLTAREN Apply 2 g topically 2 (two) times daily as needed (pain).   esomeprazole 40 MG capsule Commonly known as: NEXIUM Take 40 mg by mouth 2 (two) times daily.   metoprolol tartrate 100 MG tablet Commonly known as: LOPRESSOR Take 1 tablet (100 mg total) by mouth 2 (two) times daily.   rosuvastatin 40 MG tablet Commonly known as: CRESTOR Take 1 tablet (40 mg total) by mouth daily at 6 PM.   telmisartan 20 MG tablet Commonly known  as: MICARDIS Take 20 mg by mouth daily.   traMADol 50 MG tablet Commonly known as: ULTRAM Take 1 tablet (50 mg total) by mouth every 6 (six) hours as needed for up to 7 days for moderate pain.   VITAMIN B COMPLEX PO Take 1 tablet by mouth daily.       Follow-up Information    [facility] [place]. Go on 06/26/2020.   Specialty: Cardiothoracic Surgery Why: Your appointment is at 3:00.  Please arrive 30 minutes early for a chest x-ray to be performed by [facility] located on the first floor of the same building.  Contact information: [place], [place] [phone]       Dr. Melonie Florida Follow up on 06/14/2020.   Why: Please arrive 15 minutes early for your 10am post-hospital cardiology appointment. You will see Dr. Melonie Florida for this visit; he is one of Dr. Abundio Miu collegues  Contact information: [address] [place] [address] [place], [place], DO. Call.   Specialty:  Family Medicine Why: for a follow up appointment in about 1 month regarding further surveillance of HGA1C 6.2 (pre diabetes) Contact information: [place] [address] [place], [place], [place]. Go on 06/28/2020.   Specialties: Cardiology, Radiology Why: Your appointment is at 2:15pm.  Contact information: [address] [place] [address] [phone]             The patient has been discharged on:   1.Beta Blocker:  Yes [  y ]                              No   [   ]                              If No, reason:  2.Ace Inhibitor/ARB: Yes [  y ]                                     No  [    ]                                     If No, reason:  3.Statin:   Yes Blue.Reese  ]                  No  [   ]                  If No, reason:  4.Ecasa:  Yes  [ y  ]                  No   [   ]                  If No, reason:     Signed: Antony Odea, PA-C 06/08/2020, 7:31 AM

## 2020-06-07 NOTE — Progress Notes (Signed)
Mobility Specialist - Progress Note   06/07/20 1303  Mobility  Activity Ambulated in hall  Level of Assistance Minimal assist, patient does 75% or more  Assistive Device None  Distance Ambulated (ft) 370 ft  Mobility Ambulated with assistance in hallway  Mobility Response Tolerated well  Mobility performed by Mobility specialist  $Mobility charge 1 Mobility   Pt min assist to sit up on edge of bed w/ sternal precautions, otherwise standby assist. Distance limited by general fatigue. Pt back in bed after walk, VSS throughout.   Pricilla Handler Mobility Specialist Mobility Specialist Phone: [phone]

## 2020-06-07 NOTE — Progress Notes (Addendum)
      [address]       [place] [address]             [phone]      4 Days Post-Op Procedure(s) (LRB): CORONARY ARTERY BYPASS GRAFTING (CABG) TIMES THREE USING RIGHT GREATER SAPHENOUS VEIN HARVESTED ENDOSCOPICALLY AND LEFT INTERNAL MAMMARY ARTERY. (N/A)   Subjective:  Up in the bedside chair, feels he is progressing. Walking with minimal assistance.   Had right thoracentesis yesterday with 772ml removed. CXR improved. Now on RA and sat 96%.  No BM yet.    Objective: Vital signs in last 24 hours: Temp:  [97.9 F (36.6 C)-98.5 F (36.9 C)] 98.3 F (36.8 C) (06/01 0743) Pulse Rate:  [95-114] 99 (06/01 0743) Cardiac Rhythm: Sinus tachycardia (06/01 0650) Resp:  [18-36] 20 (06/01 0743) BP: (133-164)/(85-100) 144/85 (06/01 0743) SpO2:  [91 %-99 %] 92 % (06/01 0743) Weight:  [77.1 kg] 77.1 kg (06/01 0640)  Intake/Output from previous day: 05/31 0701 - 06/01 0700 In: 1100 [P.O.:1100] Out: 2726 [Urine:2026]  General appearance: alert, cooperative and no distress Heart: regular rate and rhythm Lungs:  breath sounds clear Abdomen: soft non-distended Extremities: no edema Wounds: clean and dry  Lab Results: Recent Labs    06/05/20 0305 06/07/20 0146  WBC 5.3 11.9*  HGB 8.7* 9.5*  HCT 27.3* 30.0*  PLT 185 328   BMET:  Recent Labs    06/05/20 0027 06/07/20 0146  NA 138 138  K 5.0 3.9  CL 106 102  CO2 23 25  GLUCOSE 114* 123*  BUN 21 20  CREATININE 1.25* 1.26*  CALCIUM 7.7* 8.7*    PT/INR: No results for input(s): LABPROT, INR in the last 72 hours. ABG    Component Value Date/Time   PHART 7.342 (L) 06/02/2020 1902   HCO3 24.0 06/02/2020 1902   TCO2 25 06/02/2020 1902   ACIDBASEDEF 2.0 06/02/2020 1902   O2SAT 99.0 06/02/2020 1902   CBG (last 3)  Recent Labs    06/04/20 1614 06/04/20 2118 06/05/20 0624  GLUCAP 113* 130* 123*    Assessment/Plan: S/P Procedure(s) (LRB): CORONARY ARTERY BYPASS GRAFTING (CABG) TIMES THREE  USING RIGHT GREATER SAPHENOUS VEIN HARVESTED ENDOSCOPICALLY AND LEFT INTERNAL MAMMARY ARTERY. (N/A)   -POD5 CABG x3. Progressing with mobility. Stable cardiac rhythm.   -Hypertension--also tends to be tachycardic. On Avapro and metoprolol.  Metoprolol increased  to 50mg  po BID last evening. Observe for now. May need higher dosing.   -Left pleural effusion- s/p left thoracentesis 5/31. CXR improved. Now on RA. Continue pulmonary hygiene.   -Nausea-resolved, no BM yet. Will give Miralax today.   -EBL anemia- Hct trending up.   -Disposition- Plan discharge to home tomorrow.    Antony Odea, PA-C 06/07/2020     I have seen and examined the patient and agree with the assessment and plan as outlined.  D/C later today or tomorrow  Rexene Alberts, MD 06/07/2020 8:43 AM

## 2020-06-08 LAB — GLUCOSE, CAPILLARY: Glucose-Capillary: 167 mg/dL — ABNORMAL HIGH (ref 70–99)

## 2020-06-08 MED ORDER — METOPROLOL TARTRATE 100 MG PO TABS: 100.0000 mg | ORAL_TABLET | Freq: Two times a day (BID) | ORAL | Status: DC

## 2020-06-08 MED ORDER — METOPROLOL TARTRATE 100 MG PO TABS
100.0000 mg | ORAL_TABLET | Freq: Two times a day (BID) | ORAL | Status: DC
  Administered 2020-06-08: 100 mg via ORAL
  Filled 2020-06-08: qty 1

## 2020-06-08 MED ORDER — ROSUVASTATIN CALCIUM 40 MG PO TABS: 40.0000 mg | ORAL_TABLET | Freq: Every day | ORAL | 3 refills | Status: DC

## 2020-06-08 MED ORDER — TRAMADOL HCL 50 MG PO TABS: 50.0000 mg | ORAL_TABLET | Freq: Four times a day (QID) | ORAL | 0 refills | Status: AC | PRN

## 2020-06-08 MED ORDER — ASPIRIN 81 MG PO TBEC
81.0000 mg | DELAYED_RELEASE_TABLET | Freq: Every day | ORAL | 11 refills | Status: AC
Start: 2020-06-08 — End: ?

## 2020-06-08 MED ORDER — METOPROLOL TARTRATE 100 MG PO TABS: 100.0000 mg | ORAL_TABLET | Freq: Two times a day (BID) | ORAL | 3 refills | Status: DC

## 2020-06-08 MED ORDER — ~~LOC~~ CARDIAC SURGERY, PATIENT & FAMILY EDUCATION: 1.0000 | Freq: Once | 0 refills | Status: DC

## 2020-06-08 MED ORDER — CLOPIDOGREL BISULFATE 75 MG PO TABS: 75.0000 mg | ORAL_TABLET | Freq: Every day | ORAL | 11 refills | Status: DC

## 2020-06-08 NOTE — Progress Notes (Addendum)
      [address]       [place] [address]             [phone]      6 Days Post-Op Procedure(s) (LRB): CORONARY ARTERY BYPASS GRAFTING (CABG) TIMES THREE USING RIGHT GREATER SAPHENOUS VEIN HARVESTED ENDOSCOPICALLY AND LEFT INTERNAL MAMMARY ARTERY. (N/A)   Subjective:  Resting in bed, says he feels good, no new concerns. Independent with mobility. BM yesterday.    Objective: Vital signs in last 24 hours: Temp:  [98.3 F (36.8 C)-98.7 F (37.1 C)] 98.5 F (36.9 C) (06/02 0655) Pulse Rate:  [93-117] 117 (06/02 0655) Cardiac Rhythm: Sinus tachycardia (06/01 1900) Resp:  [20-27] 26 (06/02 0655) BP: (127-153)/(79-98) 141/98 (06/02 0655) SpO2:  [92 %-95 %] 95 % (06/02 0655) Weight:  [75.8 kg] 75.8 kg (06/02 0655)  Intake/Output from previous day: 06/01 0701 - 06/02 0700 In: 240 [P.O.:240] Out: 700 [Urine:700]  General appearance: alert, cooperative and no distress Heart: sinus tachycardia ~110-120 past 24 hours.  Lungs:  breath sounds clear Abdomen: soft non-distended Extremities: no edema Wounds: clean and dry  Lab Results: Recent Labs    06/07/20 0146  WBC 11.9*  HGB 9.5*  HCT 30.0*  PLT 328   BMET:  Recent Labs    06/07/20 0146  NA 138  K 3.9  CL 102  CO2 25  GLUCOSE 123*  BUN 20  CREATININE 1.26*  CALCIUM 8.7*    PT/INR: No results for input(s): LABPROT, INR in the last 72 hours. ABG    Component Value Date/Time   PHART 7.342 (L) 06/02/2020 1902   HCO3 24.0 06/02/2020 1902   TCO2 25 06/02/2020 1902   ACIDBASEDEF 2.0 06/02/2020 1902   O2SAT 99.0 06/02/2020 1902   CBG (last 3)  No results for input(s): GLUCAP in the last 72 hours.  Assessment/Plan: S/P Procedure(s) (LRB): CORONARY ARTERY BYPASS GRAFTING (CABG) TIMES THREE USING RIGHT GREATER SAPHENOUS VEIN HARVESTED ENDOSCOPICALLY AND LEFT INTERNAL MAMMARY ARTERY. (N/A)   -POD6 CABG x3. Progressing well, on ASA, Plavix, Crestor, metoprolol, Avapro (micardis at  home)  -Hypertension / tachycardia--  Metoprolol increased  to 100mg  po BID. To rewume micardis at home.   -Left pleural effusion- s/p left thoracentesis 5/31. CXR improved. Now on RA.   -EBL anemia- Hct has been trending up.No new lab today.   -Disposition- Plan discharge to home today  Antony Odea, PA-C 06/08/2020    I have seen and examined the patient and agree with the assessment and plan as outlined.  D/C home today  Rexene Alberts, MD 06/08/2020 9:02 AM

## 2020-06-08 NOTE — Plan of Care (Signed)
DISCHARGE NOTE HOME James Wolf to be discharged home per MD order. Discussed prescriptions and follow up appointments with the patient. Medication list explained in detail. Patient verbalized understanding.  Skin clean, dry and intact without evidence of skin break down, no evidence of skin tears noted. IV catheter discontinued intact. Site without signs and symptoms of complications. Dressing and pressure applied. Pt denies pain at the site currently. No complaints noted.  Chest tube sutures removed and steri strips applied.  Patient free of lines, drains, and wounds.   An After Visit Summary (AVS) was printed and given to the patient. Patient to be escorted via wheelchair, and discharged home via private auto.  Stephan Minister, RN

## 2020-06-08 NOTE — Care Management Important Message (Deleted)
Important Message  Patient Details  Name: AMERICO VALLERY MRN: [id number] Date of Birth: [birth year]   Medicare Important Message Given:  Yes     Shelda Altes 06/08/2020, 11:17 AM

## 2020-06-08 NOTE — Progress Notes (Signed)
      [address]       [place] [address]             [phone]     Asked to see patient following what sounds like a vagal episode.  Patient was bending over and became lightheaded and weak.  He did have a new adjustment to his metoprolol from 50 to 100 mg.  He is also on an ARB.  Currently he is feeling better.  I feel he is still safe to be discharged home.  His wife is an EMT and daughter is a Therapist, sports and they will be around to assist.  They will also be available to check his blood pressure a couple times a day.  I instructed him to call our office should this type of episode recurs we may need to make some adjustments in medications.  Current heart rate is in the upper 90s and blood pressure is 962 systolic.  He remains in sinus rhythm.  He could be a little intravascularly dry, but is no longer on diuretics.  I also asked him to take the metoprolol and Micardis at separate times.    John Giovanni, PA-C

## 2020-06-08 NOTE — Progress Notes (Signed)
Discussed ed with pt and wife and daughter. Discussed IS, sternal precautions, exercise, diet, and CRPII. Pt voiced understanding and requests his referral be sent to [facility].  [id number] Yves Dill CES, ACSM 9:49 AM 06/08/2020

## 2020-06-08 NOTE — Progress Notes (Signed)
Pt was in shower with NT and family and became very light headed and weak. Telemetry applied and pt in NSR with rate 101. BP 109/77 (84). Jadene Pierini PA paged. Pt returned to bed and states that he feels a little bit better. Call light in reach.  Clyde Canterbury, RN

## 2020-06-15 ENCOUNTER — Ambulatory Visit: Payer: BC Managed Care – PPO | Admitting: Orthopedic Surgery

## 2020-06-15 ENCOUNTER — Other Ambulatory Visit: Payer: Self-pay

## 2020-06-15 ENCOUNTER — Encounter (INDEPENDENT_AMBULATORY_CARE_PROVIDER_SITE_OTHER): Payer: Self-pay

## 2020-06-15 DIAGNOSIS — Z4802 Encounter for removal of sutures: Secondary | ICD-10-CM

## 2020-06-19 ENCOUNTER — Encounter: Payer: Self-pay | Admitting: Orthopedic Surgery

## 2020-06-19 ENCOUNTER — Ambulatory Visit: Payer: BC Managed Care – PPO | Admitting: Orthopedic Surgery

## 2020-06-22 ENCOUNTER — Other Ambulatory Visit: Payer: Self-pay | Admitting: Thoracic Surgery (Cardiothoracic Vascular Surgery)

## 2020-06-22 DIAGNOSIS — Z951 Presence of aortocoronary bypass graft: Secondary | ICD-10-CM

## 2020-06-23 ENCOUNTER — Emergency Department (HOSPITAL_COMMUNITY): Payer: Medicare Other

## 2020-06-23 ENCOUNTER — Telehealth: Payer: Self-pay | Admitting: Cardiology

## 2020-06-23 ENCOUNTER — Encounter (HOSPITAL_COMMUNITY): Payer: Self-pay

## 2020-06-23 ENCOUNTER — Inpatient Hospital Stay (HOSPITAL_COMMUNITY)
Admission: EM | Admit: 2020-06-23 | Discharge: 2020-06-25 | DRG: 179 | Disposition: A | Discharge status: Home Health | Payer: Medicare Other | Attending: Internal Medicine | Admitting: Internal Medicine

## 2020-06-23 ENCOUNTER — Other Ambulatory Visit: Payer: Self-pay

## 2020-06-23 DIAGNOSIS — Z87891 Personal history of nicotine dependence: Secondary | ICD-10-CM

## 2020-06-23 DIAGNOSIS — I25110 Atherosclerotic heart disease of native coronary artery with unstable angina pectoris: Secondary | ICD-10-CM

## 2020-06-23 DIAGNOSIS — I129 Hypertensive chronic kidney disease with stage 1 through stage 4 chronic kidney disease, or unspecified chronic kidney disease: Secondary | ICD-10-CM | POA: Diagnosis present

## 2020-06-23 DIAGNOSIS — D631 Anemia in chronic kidney disease: Secondary | ICD-10-CM | POA: Diagnosis present

## 2020-06-23 DIAGNOSIS — Z7982 Long term (current) use of aspirin: Secondary | ICD-10-CM

## 2020-06-23 DIAGNOSIS — Z951 Presence of aortocoronary bypass graft: Secondary | ICD-10-CM

## 2020-06-23 DIAGNOSIS — R9431 Abnormal electrocardiogram [ECG] [EKG]: Secondary | ICD-10-CM

## 2020-06-23 DIAGNOSIS — R911 Solitary pulmonary nodule: Secondary | ICD-10-CM

## 2020-06-23 DIAGNOSIS — Z881 Allergy status to other antibiotic agents status: Secondary | ICD-10-CM

## 2020-06-23 DIAGNOSIS — Z87442 Personal history of urinary calculi: Secondary | ICD-10-CM

## 2020-06-23 DIAGNOSIS — N1831 Chronic kidney disease, stage 3a: Secondary | ICD-10-CM | POA: Diagnosis present

## 2020-06-23 DIAGNOSIS — R0602 Shortness of breath: Secondary | ICD-10-CM | POA: Diagnosis not present

## 2020-06-23 DIAGNOSIS — J690 Pneumonitis due to inhalation of food and vomit: Secondary | ICD-10-CM | POA: Diagnosis not present

## 2020-06-23 DIAGNOSIS — Z7902 Long term (current) use of antithrombotics/antiplatelets: Secondary | ICD-10-CM

## 2020-06-23 DIAGNOSIS — Z8249 Family history of ischemic heart disease and other diseases of the circulatory system: Secondary | ICD-10-CM

## 2020-06-23 DIAGNOSIS — I10 Essential (primary) hypertension: Secondary | ICD-10-CM | POA: Diagnosis present

## 2020-06-23 DIAGNOSIS — Z79899 Other long term (current) drug therapy: Secondary | ICD-10-CM

## 2020-06-23 DIAGNOSIS — Z20822 Contact with and (suspected) exposure to covid-19: Secondary | ICD-10-CM | POA: Diagnosis present

## 2020-06-23 DIAGNOSIS — K219 Gastro-esophageal reflux disease without esophagitis: Secondary | ICD-10-CM | POA: Diagnosis present

## 2020-06-23 DIAGNOSIS — I214 Non-ST elevation (NSTEMI) myocardial infarction: Secondary | ICD-10-CM

## 2020-06-23 DIAGNOSIS — I2510 Atherosclerotic heart disease of native coronary artery without angina pectoris: Secondary | ICD-10-CM | POA: Diagnosis present

## 2020-06-23 DIAGNOSIS — E785 Hyperlipidemia, unspecified: Secondary | ICD-10-CM | POA: Diagnosis present

## 2020-06-23 DIAGNOSIS — M810 Age-related osteoporosis without current pathological fracture: Secondary | ICD-10-CM | POA: Diagnosis present

## 2020-06-23 DIAGNOSIS — J449 Chronic obstructive pulmonary disease, unspecified: Secondary | ICD-10-CM | POA: Diagnosis present

## 2020-06-23 DIAGNOSIS — I252 Old myocardial infarction: Secondary | ICD-10-CM

## 2020-06-23 DIAGNOSIS — Z8501 Personal history of malignant neoplasm of esophagus: Secondary | ICD-10-CM

## 2020-06-23 DIAGNOSIS — Z8701 Personal history of pneumonia (recurrent): Secondary | ICD-10-CM

## 2020-06-23 DIAGNOSIS — E876 Hypokalemia: Secondary | ICD-10-CM | POA: Diagnosis present

## 2020-06-23 DIAGNOSIS — J440 Chronic obstructive pulmonary disease with acute lower respiratory infection: Secondary | ICD-10-CM

## 2020-06-23 DIAGNOSIS — Z823 Family history of stroke: Secondary | ICD-10-CM

## 2020-06-23 LAB — CBC WITH DIFFERENTIAL/PLATELET
Abs Immature Granulocytes: 0.04 10*3/uL (ref 0.00–0.07)
Basophils Absolute: 0.1 10*3/uL (ref 0.0–0.1)
Basophils Relative: 1 %
Eosinophils Absolute: 0.1 10*3/uL (ref 0.0–0.5)
Eosinophils Relative: 1 %
HCT: 36.4 % — ABNORMAL LOW (ref 39.0–52.0)
Hemoglobin: 11.2 g/dL — ABNORMAL LOW (ref 13.0–17.0)
Immature Granulocytes: 0 %
Lymphocytes Relative: 15 %
Lymphs Abs: 1.5 10*3/uL (ref 0.7–4.0)
MCH: 28.4 pg (ref 26.0–34.0)
MCHC: 30.8 g/dL (ref 30.0–36.0)
MCV: 92.4 fL (ref 80.0–100.0)
Monocytes Absolute: 0.8 10*3/uL (ref 0.1–1.0)
Monocytes Relative: 8 %
Neutro Abs: 7.5 10*3/uL (ref 1.7–7.7)
Neutrophils Relative %: 75 %
Platelets: 392 10*3/uL (ref 150–400)
RBC: 3.94 MIL/uL — ABNORMAL LOW (ref 4.22–5.81)
RDW: 14.5 % (ref 11.5–15.5)
WBC: 9.9 10*3/uL (ref 4.0–10.5)
nRBC: 0 % (ref 0.0–0.2)

## 2020-06-23 LAB — TROPONIN I (HIGH SENSITIVITY)
Troponin I (High Sensitivity): 16 ng/L (ref <18)
Troponin I (High Sensitivity): 16 ng/L (ref <18)

## 2020-06-23 LAB — BASIC METABOLIC PANEL
Anion gap: 9 (ref 5–15)
BUN: 17 mg/dL (ref 8–23)
CO2: 26 mmol/L (ref 22–32)
Calcium: 9.1 mg/dL (ref 8.9–10.3)
Chloride: 103 mmol/L (ref 98–111)
Creatinine, Ser: 1.17 mg/dL (ref 0.61–1.24)
GFR, Estimated: >60 mL/min (ref >60)
Glucose, Bld: 130 mg/dL — ABNORMAL HIGH (ref 70–99)
Potassium: 4 mmol/L (ref 3.5–5.1)
Sodium: 138 mmol/L (ref 135–145)

## 2020-06-23 LAB — RESP PANEL BY RT-PCR (FLU A&B, COVID) ARPGX2
Influenza A by PCR: NEGATIVE
Influenza B by PCR: NEGATIVE
SARS Coronavirus 2 by RT PCR: NEGATIVE

## 2020-06-23 IMAGING — DX DG CHEST 1V PORT
1 series · 1 of 1 positions shown · non-contrast
Comparison: [DATE], CT [DATE], radiograph [DATE]

CLINICAL DATA: Shortness of breath

EXAM:
PORTABLE CHEST 1 VIEW

[chest ap]
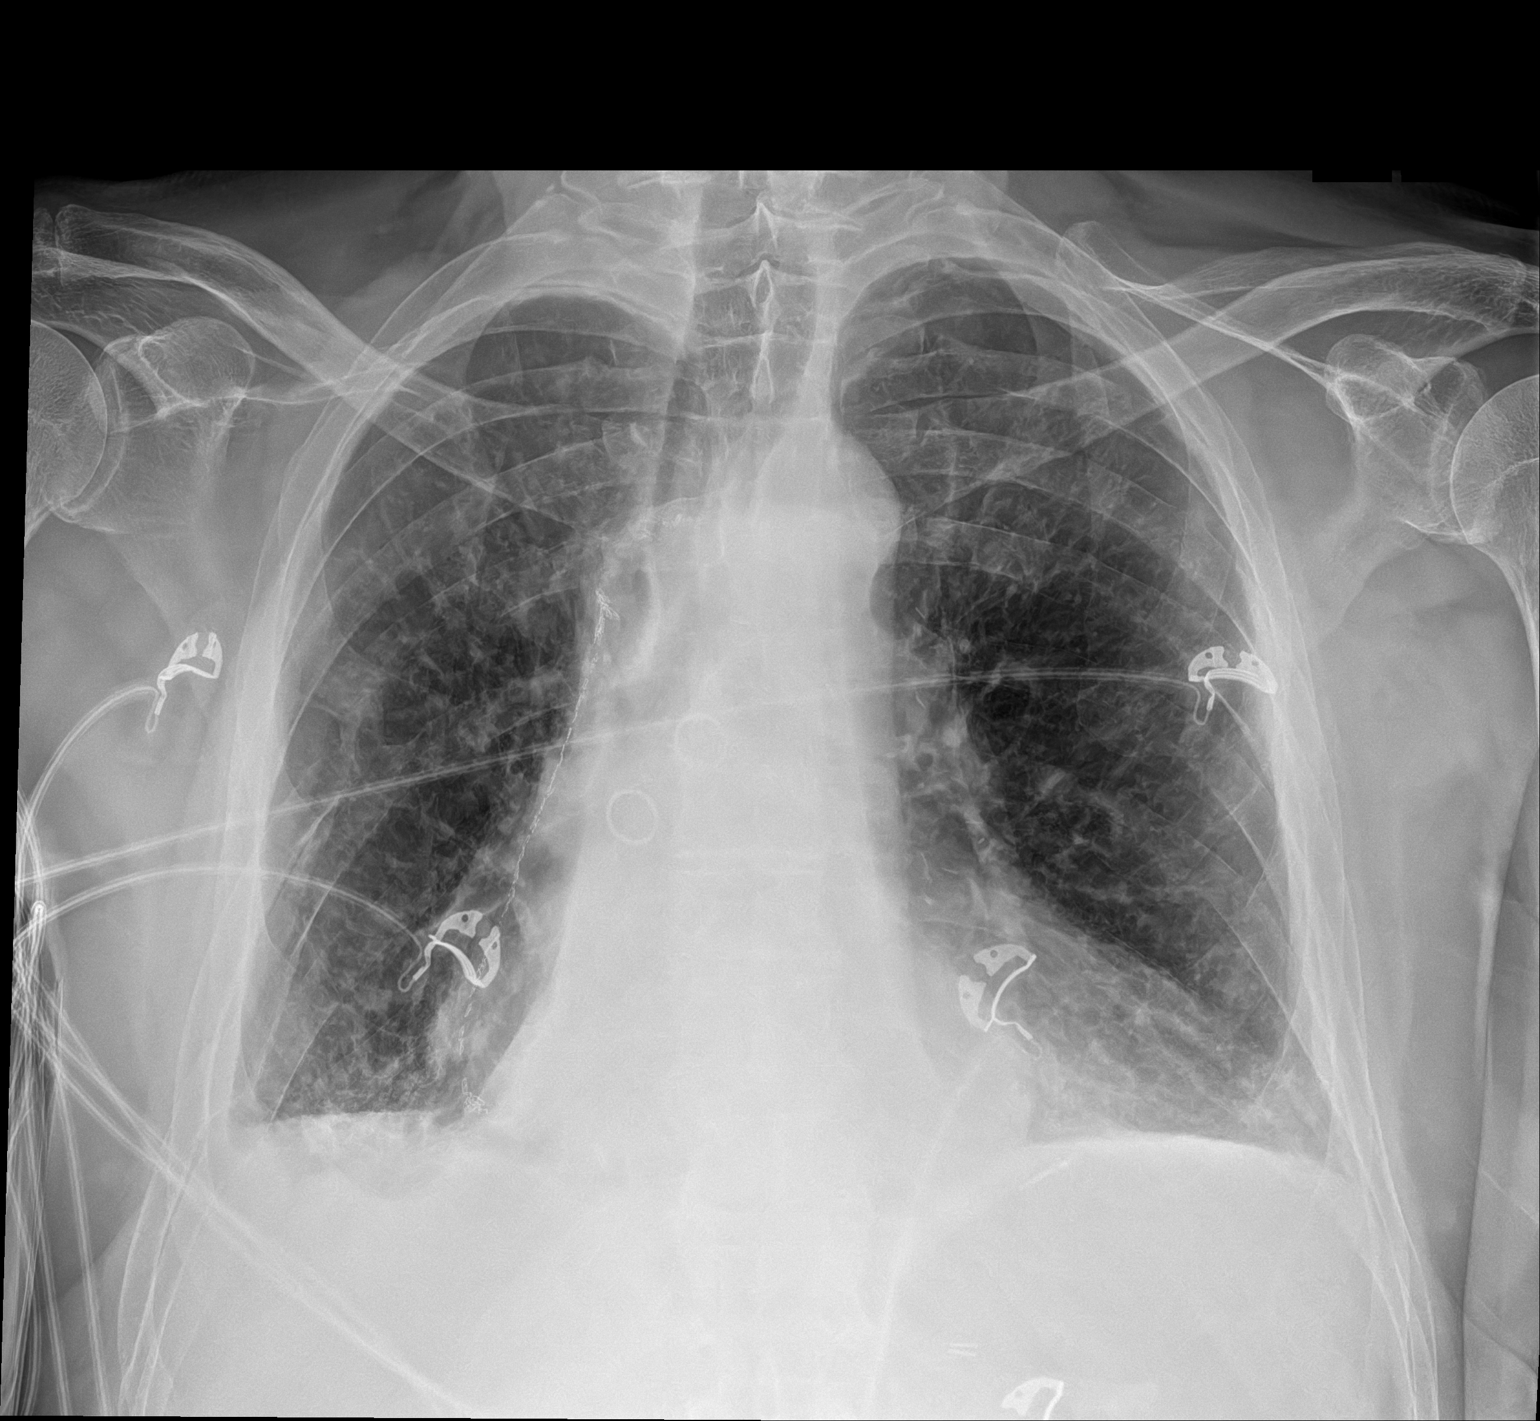

[1 of 1 positions shown; findings below may reference images not displayed]

FINDINGS: Chronic pleural effusion or thickening on the right. Postsurgical
changes paralleling the right mediastinal silhouette. Coarse chronic
interstitial opacities. Cardiomegaly. Hiatal hernia. No
pneumothorax. Chronic right-sided rib deformities. Postsurgical
changes of the mediastinum.
IMPRESSION: 1. Cardiomegaly without edema. Improved aeration at the bases
compared to prior.
2. Small right chronic pleural effusion versus pleural thickening.
Chronic interstitial lung opacities and bronchitic changes.

## 2020-06-23 IMAGING — CT CT ANGIO CHEST
2 of 6 series · 17 of 46 positions shown · IV contrast (Omnipaque or Isovue)
Comparison: Chest radiograph earlier today.  Chest CT [DATE]

CLINICAL DATA: Shortness of breath with exertion. PE suspected,
high prob

Patient reports aspiration 2 weeks ago.  Recent CABG.
EXAM:
CT ANGIOGRAPHY CHEST WITH CONTRAST
TECHNIQUE: Multidetector CT imaging of the chest was performed using the
standard protocol during bolus administration of intravenous
contrast. Multiplanar CT image reconstructions and MIPs were
obtained to evaluate the vascular anatomy.
CONTRAST:  75mL OMNIPAQUE IOHEXOL 350 MG/ML SOLN

[Series 5: pe axial thins · axial · 0.73mm/px · z∈[+975,+1240]mm · 14 of 364 slices shown]
[im 16/364  lung]
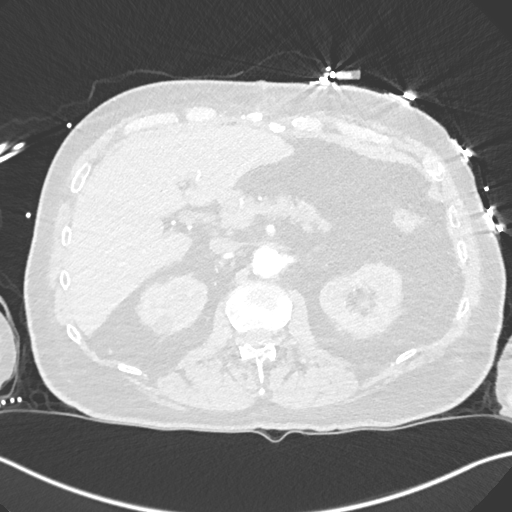
[im 46/364  soft-tissue]
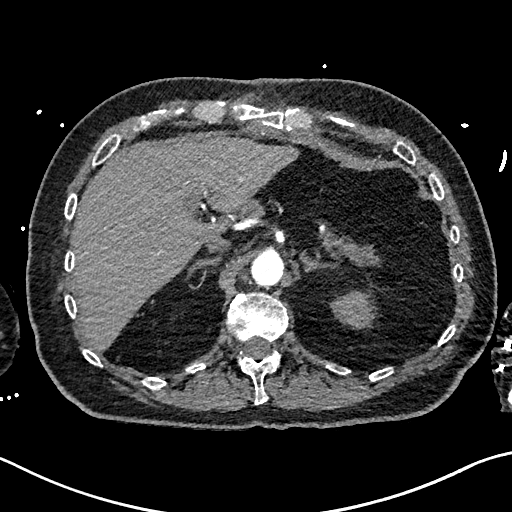
[im 76/364  lung]
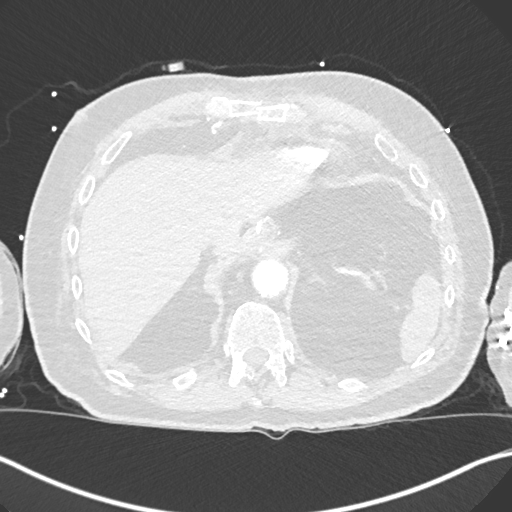
[im 91/364  soft-tissue]
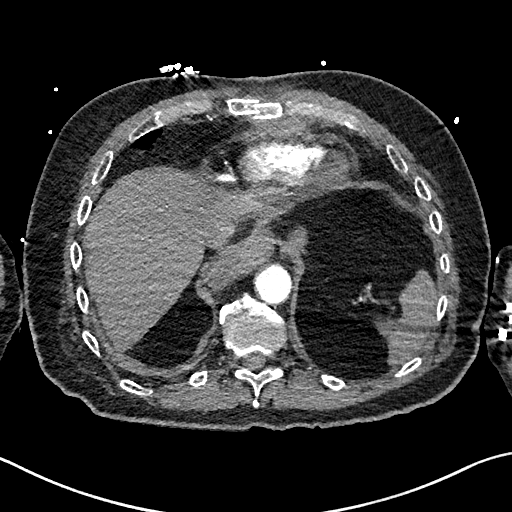
[im 122/364  lung]
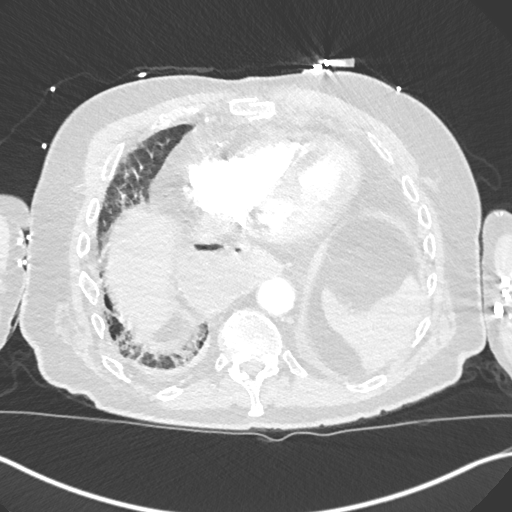
[im 152/364  soft-tissue]
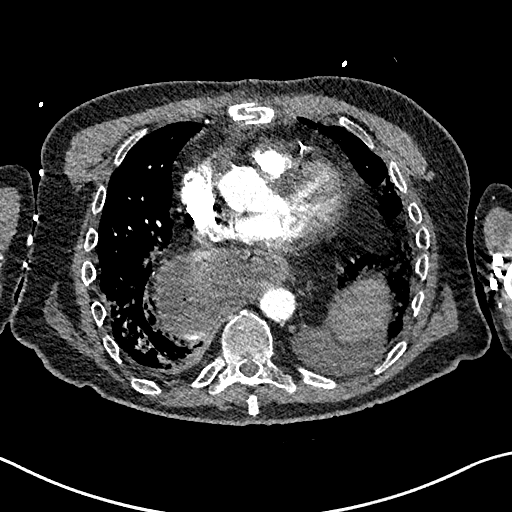
[im 167/364  lung]
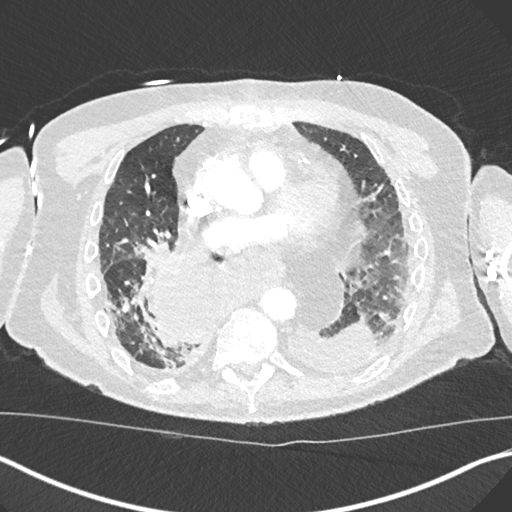
[im 197/364  soft-tissue]
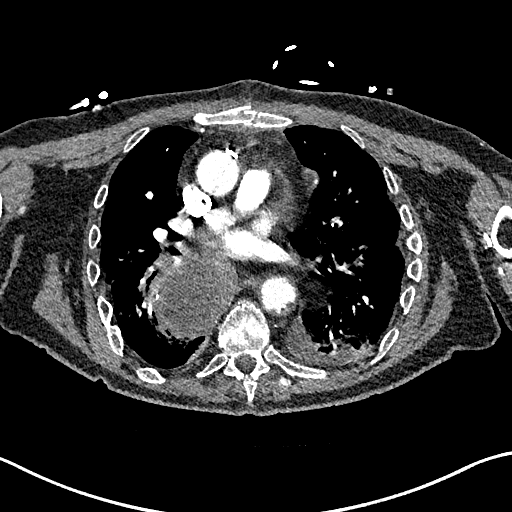
[im 212/364  lung]
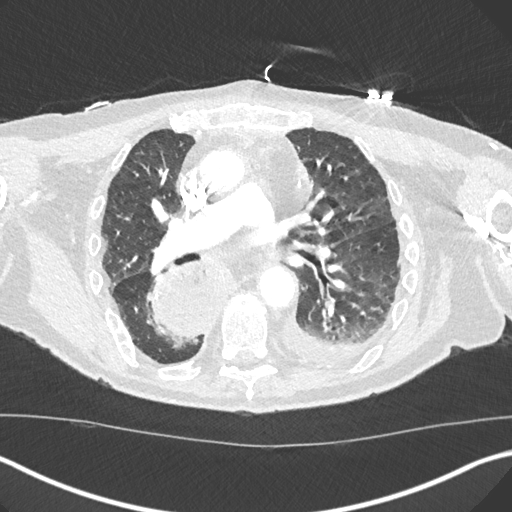
[im 243/364  soft-tissue]
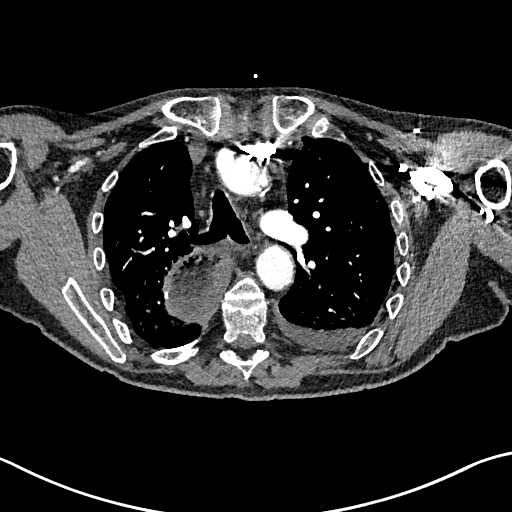
[im 273/364  lung]
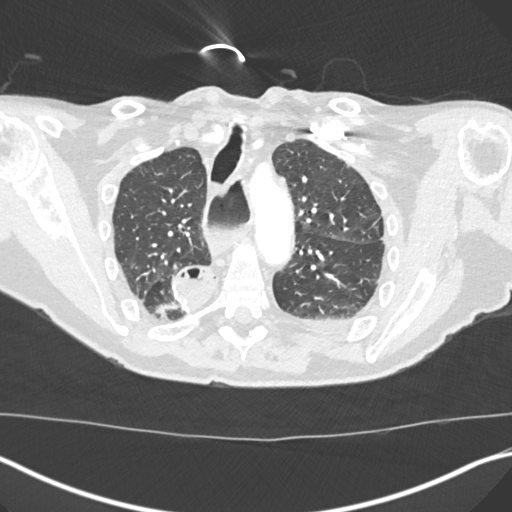
[im 288/364  soft-tissue]
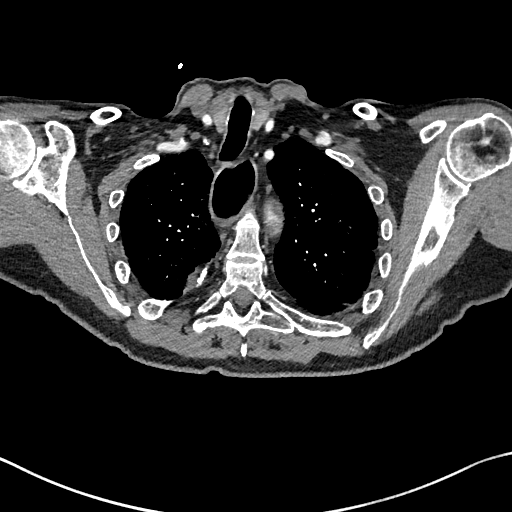
[im 318/364  lung]
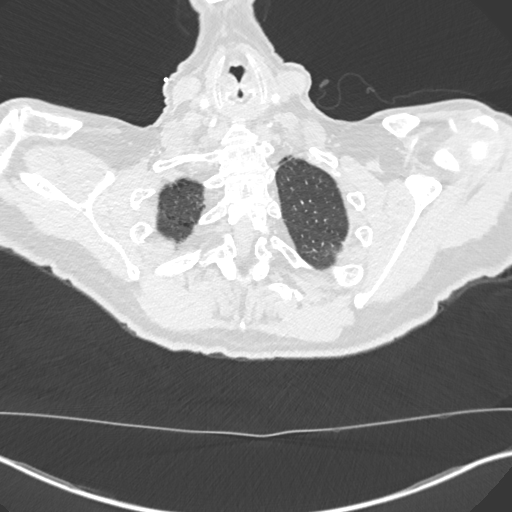
[im 348/364  soft-tissue]
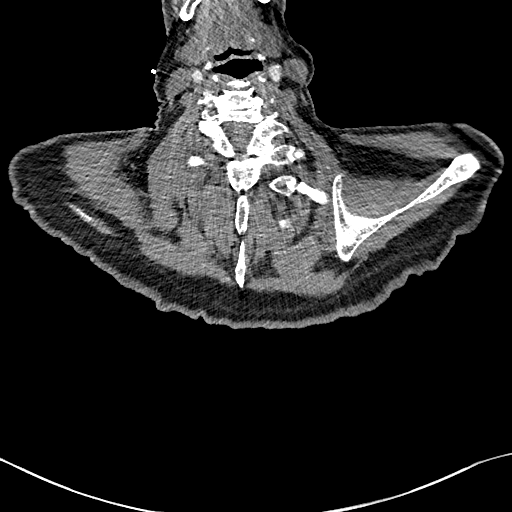

[Series 7: cor soft · coronal · 0.59mm/px · 3 of 151 slices shown]
[im 38/151  soft-tissue]
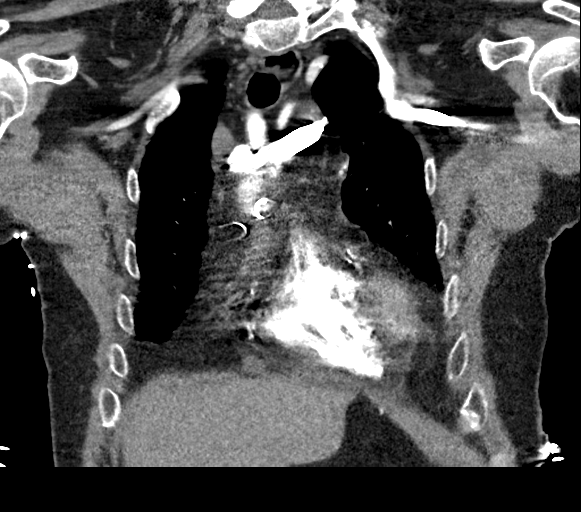
[im 76/151  soft-tissue]
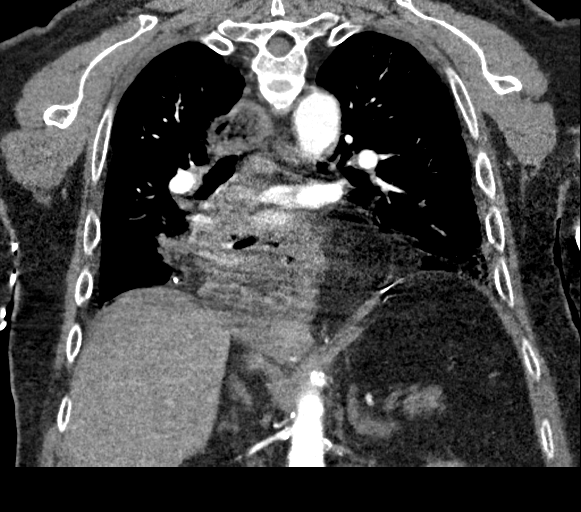
[im 113/151  soft-tissue]
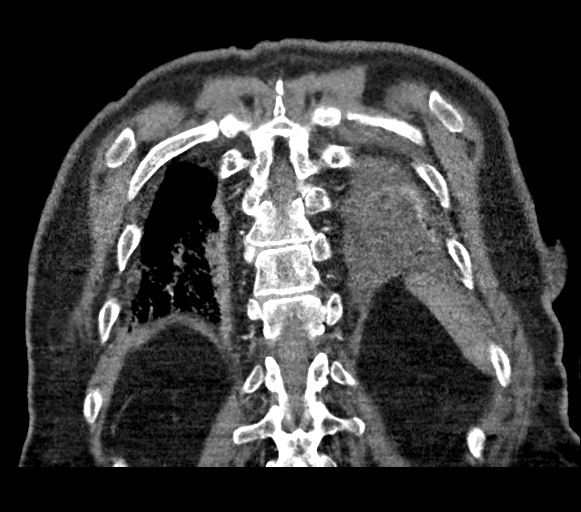

[17 of 46 positions shown; findings below may reference images not displayed]

FINDINGS: Cardiovascular: There are no filling defects within the pulmonary
arteries to suggest pulmonary embolus. Pulmonary arteries are
opacified to the distal segmental level. Subsegmental branches are
not well assessed. Heart is borderline enlarged. Coronary artery
calcifications, post CABG. The thoracic aorta is tortuous. No aortic
aneurysm. Calcified is well as noncalcified atheromatous plaque with
irregular plaque involving the descending thoracic aorta. No
dissection or findings of acute aortic syndrome. Left vertebral
artery arises directly from the thoracic aorta, variant anatomy
minimal soft tissue thickening anterior to the heart likely related
to recent surgery, no discrete fluid collection.

Mediastinum/Nodes: Esophagectomy with gastric pull-through. Gastric
pull-through is diffusely fluid-filled. There is mild narrowing of
the diaphragmatic hiatus adjacent to surgical sutures. No enlarged
mediastinal or hilar lymph nodes. No thyroid nodule.

Lungs/Pleura: Ovoid smoothly marginated nodule in the left upper
lobe measures 13 x 10 mm, series 6, image 54. There is compressive
atelectasis as well as patchy ground-glass density in the dependent
right lower lobe. Gastric pull-through causes mild external
compression on the segmental right lower lobe bronchi. No definite
intraluminal debris in the tracheobronchial tree. There is a small
left pleural effusion with adjacent compressive atelectasis. Minimal
ground-glass density in the dependent left lower lobe. Minimal right
pleural thickening without significant effusion.

Upper Abdomen: No acute findings. Nonobstructing stone in the upper
right kidney.

Musculoskeletal: Chronic right rib deformity. Exaggerated thoracic
kyphosis with mild multilevel chronic vertebral body wedging. Recent
sternotomy but no sternal wires. Sternotomy is healing. Focal bone
lesion.

Review of the MIP images confirms the above findings.
IMPRESSION: 1. No pulmonary embolus.
2. Esophagectomy with gastric pull-through. Gastric pull-through is
diffusely fluid-filled. Mild narrowing of the diaphragmatic hiatus
adjacent to surgical sutures.
3. Small left pleural effusion with adjacent compressive
atelectasis. Patchy and ground-glass density in the dependent right
greater than left lower lobe may be atelectasis, aspiration, or
pneumonia. Given provided history, aspiration is favored.
4. Smoothly marginated 13 x 10 mm nodule in the left upper lobe.
This may be a pulmonary hamartoma, however is new from [JP] exam.
For nodule of this size consider either short interval chest CT
follow-up or PET characterization in 3 months.
5. Aortic atherosclerosis including calcified and irregular
noncalcified atheromatous plaque. No acute aortic findings.

Aortic Atherosclerosis ([JP]-[JP]).

## 2020-06-23 MED ORDER — IOHEXOL 350 MG/ML SOLN
75.0000 mL | Freq: Once | INTRAVENOUS | Status: AC | PRN
  Administered 2020-06-23: 75 mL via INTRAVENOUS

## 2020-06-23 MED ORDER — SODIUM CHLORIDE 0.9 % IV SOLN
1.0000 g | Freq: Once | INTRAVENOUS | Status: AC
  Administered 2020-06-23: 1 g via INTRAVENOUS
  Filled 2020-06-23: qty 10

## 2020-06-23 MED ORDER — METRONIDAZOLE 500 MG/100ML IV SOLN
500.0000 mg | Freq: Once | INTRAVENOUS | Status: AC
  Administered 2020-06-23: 500 mg via INTRAVENOUS
  Filled 2020-06-23: qty 100

## 2020-06-23 MED ORDER — BENZONATATE 100 MG PO CAPS
200.0000 mg | ORAL_CAPSULE | Freq: Once | ORAL | Status: AC
  Administered 2020-06-23: 200 mg via ORAL
  Filled 2020-06-23: qty 2

## 2020-06-23 MED ORDER — ALBUTEROL SULFATE HFA 108 (90 BASE) MCG/ACT IN AERS
2.0000 | INHALATION_SPRAY | Freq: Once | RESPIRATORY_TRACT | Status: AC
  Administered 2020-06-23: 2 via RESPIRATORY_TRACT
  Filled 2020-06-23: qty 6.7

## 2020-06-23 NOTE — H&P (Signed)
History and Physical  ADELFO DIEBEL AST:[id number] DOB: 12-Dec-1950 DOA: 06/23/2020  Referring physician:  Landis Martins PCP: Sherrilee Gilles, DO  Patient coming from: Home  Chief Complaint: Shortness of breath  HPI: James Wolf is a 70 y.o. male with medical history significant for  hypertension, GERD, COPD, CAD, NSTEMI s/p CABG x 3, hyperlipidemia who presents to the emergency department due to complaints of shortness of breath which started today, this was associated with nonproductive cough.  He denies chest pain other than midsternal soreness associated with cough.  Patient states that he is prone to aspiration, and he believes that he must have had about 2 episodes of aspiration during the week.  Since he recently underwent CABG on 5/27, he decided to go to the ED for further evaluation and management.  Patient denies fever, chills or having any sick contact.  ED Course:  In the emergency department, he was tachypneic and tachycardic, but he was afebrile.  O2 sat was 94-7% on room air.  Work-up in the ED showed normocytic anemia and normal BMP except for CBG of 130.  Troponin x2 was flat at 16.  Influenza A, B, SARS coronavirus 2 was negative. CT angiography of chest with contrast showed no pulmonary embolus but showed a esophagectomy with gastric pull-through.  Gastric pull-through is diffusely fluid-filled.  Bilateral patchy groundglass density suspected to be due to possible atelectasis, aspiration or pneumonia noted. Patient was presumed to have aspiration pneumonia and was started on IV ceftriaxone and metronidazole.  Breathing treatment was provided.  Hospitalist was asked to admit patient for further evaluation and management.  Review of Systems: Constitutional: Negative for chills and fever.  HENT: Negative for ear pain and sore throat.   Eyes: Negative for pain and visual disturbance.  Respiratory: Positive for shortness of breath and nonproductive cough Cardiovascular:  Negative for chest pain and palpitations.  Gastrointestinal: Negative for abdominal pain and vomiting.  Endocrine: Negative for polyphagia and polyuria.  Genitourinary: Negative for decreased urine volume, dysuria, enuresis Musculoskeletal: Negative for arthralgias and back pain.  Skin: Negative for color change and rash.  Allergic/Immunologic: Negative for immunocompromised state.  Neurological: Negative for tremors, syncope, speech difficulty, weakness, light-headedness and headaches.  Hematological: Does not bruise/bleed easily.  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   LIMA to LAD, SVG to OM, SVG to RCA   Past Surgical History:  Procedure Laterality Date   CORONARY ARTERY BYPASS GRAFT N/A 06/02/2020   Procedure: CORONARY ARTERY BYPASS GRAFTING (CABG) TIMES THREE USING RIGHT GREATER SAPHENOUS VEIN HARVESTED ENDOSCOPICALLY AND LEFT INTERNAL MAMMARY ARTERY.;  Surgeon: Rexene Alberts, MD;  Location: [facility];  Service: Open Heart Surgery;  Laterality: N/A;   CYSTOSCOPY/RETROGRADE/URETEROSCOPY/STONE EXTRACTION WITH BASKET     ESOPHAGECTOMY     Ivor-Lewis esophagectomy 1995 [facility]   IR THORACENTESIS ASP PLEURAL SPACE W/IMG GUIDE  06/06/2020   LEFT HEART CATH AND CORONARY ANGIOGRAPHY N/A 05/30/2020   Procedure: LEFT HEART CATH AND CORONARY  ANGIOGRAPHY;  Surgeon: Martinique, Peter M, MD;  Location: [facility];  Service: Cardiovascular;  Laterality: N/A;   partial esophageal ejectory     SHOULDER OPEN ROTATOR CUFF REPAIR Left 10/26/2019   Procedure: ROTATOR CUFF REPAIR SHOULDER OPEN LEFT;   Surgeon: [name], MD;  Location: [facility];  Service: Orthopedics;  Laterality: Left;    Social History:  reports that he quit smoking about 27 years ago. His smoking use included cigarettes. He has a 25.00 pack-year smoking history. He has never used smokeless tobacco. He reports previous alcohol use. He reports that he does not use drugs.   Allergies  Allergen Reactions   Zithromax [Azithromycin] Diarrhea    Causes pt's stomach to "tear up" does not want to take medication again.     Family History  Problem Relation Age of Onset   CAD Father    CVA Father      Prior to Admission medications   Medication Sig Start Date End Date Taking? Authorizing Provider  albuterol (VENTOLIN HFA) 108 (90 Base) MCG/ACT inhaler Inhale 1-2 puffs into the lungs every 6 (six) hours as needed for wheezing or shortness of breath.   Yes [provider]  aspirin EC 81 MG EC tablet Take 1 tablet (81 mg total) by mouth daily. Swallow whole. 06/08/20  Yes Roddenberry, Myron G, PA-C  B Complex Vitamins (VITAMIN B COMPLEX PO) Take 1 tablet by mouth daily.  03/10/07  Yes [provider]  cholecalciferol (VITAMIN D3) 25 MCG (1000 UNIT) tablet Take 1,000 Units by mouth daily.   Yes [provider]  clopidogrel (PLAVIX) 75 MG tablet Take 1 tablet (75 mg total) by mouth daily. 06/08/20  Yes Roddenberry, Arlis Porta, PA-C  diclofenac sodium (VOLTAREN) 1 % GEL Apply 2 g topically 2 (two) times daily as needed (pain).  09/24/15  Yes [provider]  esomeprazole (NEXIUM) 40 MG capsule Take 40 mg by mouth 2 (two) times daily.  05/26/17  Yes [provider]  metoprolol tartrate (LOPRESSOR) 100 MG tablet Take 1 tablet (100 mg total) by mouth 2 (two) times daily. 06/08/20  Yes Roddenberry, Arlis Porta, PA-C  rosuvastatin (CRESTOR) 40 MG tablet Take 1 tablet (40 mg total) by mouth daily at 6 PM. 06/08/20  Yes Roddenberry, Myron G, PA-C  telmisartan (MICARDIS) 20 MG tablet Take 20 mg by mouth  daily.  06/30/17  Yes [provider]    Physical Exam: BP 107/78 (BP Location: Right Arm)   Pulse (!) 110   Temp 98.3 F (36.8 C) (Oral)   Resp 18   Ht 5\' 5"  (1.651 m)   Wt 70 kg   SpO2 97%   BMI 25.68 kg/m   General: 70 y.o. year-old male well developed well nourished in no acute distress.  Alert and oriented x3. HEENT: NCAT, EOMI Neck: Supple, trachea medial Cardiovascular: Regular rate and rhythm with no rubs or gallops.  No thyromegaly or JVD noted.  No lower extremity edema. 2/4 pulses in all 4 extremities. Respiratory: Mild rhonchi bilaterally in lower lobes on auscultation with no wheezes or rales.  Abdomen: Soft, nontender nondistended with normal bowel sounds x4 quadrants. Muskuloskeletal: No cyanosis, clubbing or edema noted bilaterally Neuro: CN II-XII intact, strength 5/5 x 4, sensation, reflexes intact Skin: No ulcerative lesions noted or rashes Psychiatry: Judgement and insight appear normal. Mood is appropriate for condition and setting          Labs on Admission:  Basic Metabolic Panel: Recent Labs  Lab 06/23/20 1703  NA 138  K 4.0  CL 103  CO2 26  GLUCOSE 130*  BUN 17  CREATININE 1.17  CALCIUM 9.1   Liver Function Tests: No results for input(s): AST, ALT, ALKPHOS, BILITOT, PROT, ALBUMIN in the last 168 hours. No results for input(s): LIPASE, AMYLASE in the last 168 hours. No results for input(s): AMMONIA in the last 168 hours. CBC: Recent Labs  Lab 06/23/20 1703  WBC 9.9  NEUTROABS 7.5  HGB 11.2*  HCT 36.4*  MCV 92.4  PLT 392   Cardiac Enzymes: No results for input(s): CKTOTAL, CKMB, CKMBINDEX, TROPONINI in the last 168 hours.  BNP (last 3 results) No results for input(s): BNP in the last 8760 hours.  ProBNP (last 3 results) No results for input(s): PROBNP in the last 8760 hours.  CBG: No results for input(s): GLUCAP in the last 168 hours.  Radiological Exams on Admission: CT Angio Chest PE W and/or Wo Contrast  Result  Date: 06/23/2020 CLINICAL DATA:  Shortness of breath with exertion. PE suspected, high prob Patient reports aspiration 2 weeks ago.  Recent CABG. EXAM: CT ANGIOGRAPHY CHEST WITH CONTRAST TECHNIQUE: Multidetector CT imaging of the chest was performed using the standard protocol during bolus administration of intravenous contrast. Multiplanar CT image reconstructions and MIPs were obtained to evaluate the vascular anatomy. CONTRAST:  70mL OMNIPAQUE IOHEXOL 350 MG/ML SOLN COMPARISON:  Chest radiograph earlier today.  Chest CT 07/20/2017 FINDINGS: Cardiovascular: There are no filling defects within the pulmonary arteries to suggest pulmonary embolus. Pulmonary arteries are opacified to the distal segmental level. Subsegmental branches are not well assessed. Heart is borderline enlarged. Coronary artery calcifications, post CABG. The thoracic aorta is tortuous. No aortic aneurysm. Calcified is well as noncalcified atheromatous plaque with irregular plaque involving the descending thoracic aorta. No dissection or findings of acute aortic syndrome. Left vertebral artery arises directly from the thoracic aorta, variant anatomy minimal soft tissue thickening anterior to the heart likely related to recent surgery, no discrete fluid collection. Mediastinum/Nodes: Esophagectomy with gastric pull-through. Gastric pull-through is diffusely fluid-filled. There is mild narrowing of the diaphragmatic hiatus adjacent to surgical sutures. No enlarged mediastinal or hilar lymph nodes. No thyroid nodule. Lungs/Pleura: Ovoid smoothly marginated nodule in the left upper lobe measures 13 x 10 mm, series 6, image 54. There is compressive atelectasis as well as patchy ground-glass density in the dependent right lower lobe. Gastric pull-through causes mild external compression on the segmental right lower lobe bronchi. No definite intraluminal debris in the tracheobronchial tree. There is a small left pleural effusion with adjacent  compressive atelectasis. Minimal ground-glass density in the dependent left lower lobe. Minimal right pleural thickening without significant effusion. Upper Abdomen: No acute findings. Nonobstructing stone in the upper right kidney. Musculoskeletal: Chronic right rib deformity. Exaggerated thoracic kyphosis with mild multilevel chronic vertebral body wedging. Recent sternotomy but no sternal wires. Sternotomy is healing. Focal bone lesion. Review of the MIP images confirms the above findings. IMPRESSION: 1. No pulmonary embolus. 2. Esophagectomy with gastric pull-through. Gastric pull-through is diffusely fluid-filled. Mild narrowing of the diaphragmatic hiatus adjacent to surgical sutures. 3. Small left pleural effusion with adjacent compressive atelectasis. Patchy and ground-glass density in the dependent right greater than left lower lobe may be atelectasis, aspiration, or pneumonia. Given provided history, aspiration is favored. 4. Smoothly marginated 13 x 10 mm nodule in the left upper lobe. This may be a pulmonary hamartoma, however is new from 2019 exam. For nodule of this size consider either  short interval chest CT follow-up or PET characterization in 3 months. 5. Aortic atherosclerosis including calcified and irregular noncalcified atheromatous plaque. No acute aortic findings. Aortic Atherosclerosis (ICD10-I70.0). Electronically Signed   By: Keith Rake M.D.   On: 06/23/2020 22:06   DG Chest Port 1 View  Result Date: 06/23/2020 CLINICAL DATA:  Shortness of breath EXAM: PORTABLE CHEST 1 VIEW COMPARISON:  06/07/2020, CT 07/20/2017, radiograph 11/03/2018 FINDINGS: Chronic pleural effusion or thickening on the right. Postsurgical changes paralleling the right mediastinal silhouette. Coarse chronic interstitial opacities. Cardiomegaly. Hiatal hernia. No pneumothorax. Chronic right-sided rib deformities. Postsurgical changes of the mediastinum. IMPRESSION: 1. Cardiomegaly without edema. Improved  aeration at the bases compared to prior. 2. Small right chronic pleural effusion versus pleural thickening. Chronic interstitial lung opacities and bronchitic changes. Electronically Signed   By: Donavan Foil M.D.   On: 06/23/2020 17:48    EKG: I independently viewed the EKG done and my findings are as followed: Sinus tachycardia at rate of 114 bpm with prolonged QTc (657 ms)  Assessment/Plan Present on Admission:  Aspiration pneumonitis (HCC)  Essential hypertension  GERD (gastroesophageal reflux disease)  COPD (chronic obstructive pulmonary disease) (HCC)  Coronary artery disease involving native coronary artery of native heart with unstable angina pectoris (HCC)  Non-ST elevation (NSTEMI) myocardial infarction [facility])  Active Problems:   Essential hypertension   COPD (chronic obstructive pulmonary disease) (HCC)   Aspiration pneumonitis (HCC)   GERD (gastroesophageal reflux disease)   Coronary artery disease involving native coronary artery of native heart with unstable angina pectoris (HCC)   Non-ST elevation (NSTEMI) myocardial infarction (HCC)   S/P CABG x 3   Left upper lobe pulmonary nodule   Prolonged QT interval   Aspiration pneumonitis r/o aspiration pneumonia CT angiography of chest showed bilateral patchy groundglass density suspected to be due to possible atelectasis, aspiration or pneumonia noted Patient denies fever, chills.  Cough was nonproductive. Patient was empirically started on IV ceftriaxone and metronidazole We shall continue with empiric IV Unasyn and doxycycline with plan to de-escalate based on procalcitonin Continue Mucinex, incentive spirometry, flutter valve   Prolonged QTc (657 ms) Avoid QT prolonging drugs Magnesium level will be checked Repeat EKG in the morning  Incidental finding of left upper lobe nodule CT angiography of chest showed smoothly marginated 13 x 10 mm nodule in the left upper lobe CT chest or PET follow-up in 3 months  recommended  COPD Continue albuterol  GERD Continue Protonix  NSTEMI s/p CABG x3 Continue Crestor, aspirin, Plavix Lopressor and telmisartan temporarily held due to soft BP  Hyperlipidemia Continue Crestor    DVT prophylaxis: Lovenox  Code Status: Full code  Family Communication: None at bedside  Disposition Plan:  Patient is from:                        home Anticipated DC to:                   SNF or family members home Anticipated DC date:               2-3 days Anticipated DC barriers:          Patient requires inpatient management due to aspiration pneumonitis with suspicion for aspiration pneumonia    Consults called: None  Admission status: Observation    Bernadette Hoit MD [facility]  06/24/2020, 2:56 AM

## 2020-06-23 NOTE — ED Provider Notes (Signed)
[facility] EMERGENCY DEPARTMENT Provider Note   CSN: [id number] Arrival date & time: 06/23/20  1545     History Chief Complaint  Patient presents with   Shortness of Breath    James Wolf is a 70 y.o. male with a history as outlined below, most significant for history of aspiration pneumonia secondary to severe GERD, COPD, who underwent CABG surgery on 06/02/2020 presenting with  increased shortness of breath which started today.  He does have a cough with this symptom which has been nonproductive.  He denies fevers or chills, he does have wheezing with his copd, but not worse today - last neb treatment taken yesterday.  He also denies chest pain except for midsternal soreness with coughing episodes.  He has had no peripheral edema, calf or leg pain or swelling.  He does not lie flat for sleeping secondary to his GERD symptoms so is unable to comment on orthopnea as a symptom.    The history is provided by the patient, the spouse and a relative (daughter at bedside).      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   Left main coronary artery disease 05/31/2020   Hyperlipidemia with target LDL less than 70 05/31/2020   Leukocytosis 05/30/2020   Hypokalemia 05/30/2020   GERD (gastroesophageal reflux disease) 05/30/2020   Coronary artery disease involving native coronary artery of native heart with unstable angina pectoris ([place]) 05/30/2020   Non-ST elevation (NSTEMI) myocardial infarction [facility])    Chest pain  05/29/2020   S/P left rotator cuff repair 10/26/19 11/09/2019   Nontraumatic incomplete tear of left rotator cuff    Essential hypertension 07/19/2017   COPD (chronic obstructive pulmonary disease) ([place]) 07/19/2017   History of esophageal cancer 07/19/2017   Acute respiratory failure with hypoxia ([place]) 07/19/2017   Aspiration pneumonitis ([place]) 07/19/2017   Syncope 07/19/2017    Past Surgical History:  Procedure Laterality Date   CORONARY ARTERY BYPASS GRAFT N/A 06/02/2020   Procedure: CORONARY ARTERY BYPASS GRAFTING (CABG) TIMES THREE USING RIGHT GREATER SAPHENOUS VEIN HARVESTED ENDOSCOPICALLY AND LEFT INTERNAL MAMMARY ARTERY.;  Surgeon: Rexene Alberts, MD;  Location: [place];  Service: Open Heart Surgery;  Laterality: N/A;   CYSTOSCOPY/RETROGRADE/URETEROSCOPY/STONE EXTRACTION WITH BASKET     ESOPHAGECTOMY     Ivor-Lewis esophagectomy 1995 [facility]   IR THORACENTESIS ASP PLEURAL SPACE W/IMG GUIDE  06/06/2020   LEFT HEART CATH AND CORONARY ANGIOGRAPHY N/A 05/30/2020   Procedure: LEFT HEART CATH AND CORONARY ANGIOGRAPHY;  Surgeon: Martinique, Peter M, MD;  Location: [place] [facility];  Service: Cardiovascular;  Laterality: N/A;   partial esophageal ejectory     SHOULDER OPEN ROTATOR CUFF REPAIR Left 10/26/2019   Procedure: ROTATOR CUFF REPAIR SHOULDER OPEN LEFT;  Surgeon: [name], MD;  Location: [facility];  Service: Orthopedics;  Laterality: Left;  Family History  Problem Relation Age of Onset   CAD Father    CVA Father     Social History   Tobacco Use   Smoking status: Former    Packs/day: 1.00    Years: 25.00    Pack years: 25.00    Types: Cigarettes    Quit date: 02/19/1993    Years since quitting: 27.3   Smokeless tobacco: Never  Vaping Use   Vaping Use: Never used  Substance Use Topics   Alcohol use: Not Currently    Comment: occ   Drug use: Never    Home Medications Prior to Admission medications   Medication Sig Start Date End Date Taking? Authorizing  Provider  albuterol (VENTOLIN HFA) 108 (90 Base) MCG/ACT inhaler Inhale 1-2 puffs into the lungs every 6 (six) hours as needed for wheezing or shortness of breath.   Yes [provider]  aspirin EC 81 MG EC tablet Take 1 tablet (81 mg total) by mouth daily. Swallow whole. 06/08/20  Yes Roddenberry, Myron G, PA-C  B Complex Vitamins (VITAMIN B COMPLEX PO) Take 1 tablet by mouth daily.  03/10/07  Yes [provider]  cholecalciferol (VITAMIN D3) 25 MCG (1000 UNIT) tablet Take 1,000 Units by mouth daily.   Yes [provider]  clopidogrel (PLAVIX) 75 MG tablet Take 1 tablet (75 mg total) by mouth daily. 06/08/20  Yes Roddenberry, Arlis Porta, PA-C  diclofenac sodium (VOLTAREN) 1 % GEL Apply 2 g topically 2 (two) times daily as needed (pain).  09/24/15  Yes [provider]  esomeprazole (NEXIUM) 40 MG capsule Take 40 mg by mouth 2 (two) times daily.  05/26/17  Yes [provider]  metoprolol tartrate (LOPRESSOR) 100 MG tablet Take 1 tablet (100 mg total) by mouth 2 (two) times daily. 06/08/20  Yes Roddenberry, Arlis Porta, PA-C  rosuvastatin (CRESTOR) 40 MG tablet Take 1 tablet (40 mg total) by mouth daily at 6 PM. 06/08/20  Yes Roddenberry, Myron G, PA-C  telmisartan (MICARDIS) 20 MG tablet Take 20 mg by mouth daily.  06/30/17  Yes [provider]    Allergies    Zithromax [azithromycin]  Review of Systems   Review of Systems  Constitutional:  Negative for chills and fever.  HENT:  Negative for congestion and sore throat.   Eyes: Negative.   Respiratory:  Positive for cough and shortness of breath. Negative for chest tightness.   Cardiovascular:  Negative for chest pain, palpitations and leg swelling.  Gastrointestinal:  Negative for abdominal pain, nausea and vomiting.  Genitourinary: Negative.   Musculoskeletal:  Negative for arthralgias, joint swelling and neck pain.  Skin: Negative.  Negative for rash and wound.  Neurological:  Negative for dizziness,  weakness, light-headedness, numbness and headaches.  Psychiatric/Behavioral: Negative.     Physical Exam Updated Vital Signs BP 100/84   Pulse (!) 116   Temp 98.5 F (36.9 C) (Oral)   Resp (!) 22   Ht 5\' 5"  (1.651 m)   Wt 77.1 kg   SpO2 96%   BMI 28.29 kg/m   Physical Exam Vitals and nursing note reviewed.  Constitutional:      Appearance: He is well-developed.  HENT:     Head: Normocephalic and atraumatic.  Eyes:     Conjunctiva/sclera: Conjunctivae normal.  Cardiovascular:     Rate and Rhythm: Normal rate and regular rhythm.     Heart sounds: Normal heart sounds.  Pulmonary:     Effort: Pulmonary effort is normal.  Breath sounds: Rhonchi present. No decreased breath sounds or wheezing.     Comments: Sparse rhonchi clearing with cough. No wheezing Chest:     Comments: Well healed midline surgical incision. Minimal tenderness. Abdominal:     General: Bowel sounds are normal.     Palpations: Abdomen is soft.     Tenderness: There is no abdominal tenderness.  Musculoskeletal:        General: Normal range of motion.     Cervical back: Normal range of motion.     Right lower leg: No tenderness. No edema.     Left lower leg: No tenderness. No edema.  Skin:    General: Skin is warm and dry.  Neurological:     Mental Status: He is alert.    ED Results / Procedures / Treatments   Labs (all labs ordered are listed, but only abnormal results are displayed) Labs Reviewed  BASIC METABOLIC PANEL - Abnormal; Notable for the following components:      Result Value   Glucose, Bld 130 (*)    All other components within normal limits  CBC WITH DIFFERENTIAL/PLATELET - Abnormal; Notable for the following components:   RBC 3.94 (*)    Hemoglobin 11.2 (*)    HCT 36.4 (*)    All other components within normal limits  RESP PANEL BY RT-PCR (FLU A&B, COVID) [id number]  TROPONIN I (HIGH SENSITIVITY)  TROPONIN I (HIGH SENSITIVITY)    EKG EKG Interpretation  Date/Time:  Friday  June 23 2020 20:31:47 EDT Ventricular Rate:  114 PR Interval:  126 QRS Duration: 98 QT Interval:  477 QTC Calculation: 657 R Axis:   54 Text Interpretation: Sinus tachycardia Prolonged QT interval Confirmed by Fredia Sorrow [phone]) on 06/23/2020 8:38:41 PM  Radiology CT Angio Chest PE W and/or Wo Contrast  Result Date: 06/23/2020 CLINICAL DATA:  Shortness of breath with exertion. PE suspected, high prob Patient reports aspiration 2 weeks ago.  Recent CABG. EXAM: CT ANGIOGRAPHY CHEST WITH CONTRAST TECHNIQUE: Multidetector CT imaging of the chest was performed using the standard protocol during bolus administration of intravenous contrast. Multiplanar CT image reconstructions and MIPs were obtained to evaluate the vascular anatomy. CONTRAST:  52mL OMNIPAQUE IOHEXOL 350 MG/ML SOLN COMPARISON:  Chest radiograph earlier today.  Chest CT 07/20/2017 FINDINGS: Cardiovascular: There are no filling defects within the pulmonary arteries to suggest pulmonary embolus. Pulmonary arteries are opacified to the distal segmental level. Subsegmental branches are not well assessed. Heart is borderline enlarged. Coronary artery calcifications, post CABG. The thoracic aorta is tortuous. No aortic aneurysm. Calcified is well as noncalcified atheromatous plaque with irregular plaque involving the descending thoracic aorta. No dissection or findings of acute aortic syndrome. Left vertebral artery arises directly from the thoracic aorta, variant anatomy minimal soft tissue thickening anterior to the heart likely related to recent surgery, no discrete fluid collection. Mediastinum/Nodes: Esophagectomy with gastric pull-through. Gastric pull-through is diffusely fluid-filled. There is mild narrowing of the diaphragmatic hiatus adjacent to surgical sutures. No enlarged mediastinal or hilar lymph nodes. No thyroid nodule. Lungs/Pleura: Ovoid smoothly marginated nodule in the left upper lobe measures 13 x 10 mm, series 6, image 54.  There is compressive atelectasis as well as patchy ground-glass density in the dependent right lower lobe. Gastric pull-through causes mild external compression on the segmental right lower lobe bronchi. No definite intraluminal debris in the tracheobronchial tree. There is a small left pleural effusion with adjacent compressive atelectasis. Minimal ground-glass density in the dependent left lower lobe. Minimal right  pleural thickening without significant effusion. Upper Abdomen: No acute findings. Nonobstructing stone in the upper right kidney. Musculoskeletal: Chronic right rib deformity. Exaggerated thoracic kyphosis with mild multilevel chronic vertebral body wedging. Recent sternotomy but no sternal wires. Sternotomy is healing. Focal bone lesion. Review of the MIP images confirms the above findings. IMPRESSION: 1. No pulmonary embolus. 2. Esophagectomy with gastric pull-through. Gastric pull-through is diffusely fluid-filled. Mild narrowing of the diaphragmatic hiatus adjacent to surgical sutures. 3. Small left pleural effusion with adjacent compressive atelectasis. Patchy and ground-glass density in the dependent right greater than left lower lobe may be atelectasis, aspiration, or pneumonia. Given provided history, aspiration is favored. 4. Smoothly marginated 13 x 10 mm nodule in the left upper lobe. This may be a pulmonary hamartoma, however is new from 2019 exam. For nodule of this size consider either short interval chest CT follow-up or PET characterization in 3 months. 5. Aortic atherosclerosis including calcified and irregular noncalcified atheromatous plaque. No acute aortic findings. Aortic Atherosclerosis (ICD10-I70.0). Electronically Signed   By: Keith Rake M.D.   On: 06/23/2020 22:06   DG Chest Port 1 View  Result Date: 06/23/2020 CLINICAL DATA:  Shortness of breath EXAM: PORTABLE CHEST 1 VIEW COMPARISON:  06/07/2020, CT 07/20/2017, radiograph 11/03/2018 FINDINGS: Chronic pleural  effusion or thickening on the right. Postsurgical changes paralleling the right mediastinal silhouette. Coarse chronic interstitial opacities. Cardiomegaly. Hiatal hernia. No pneumothorax. Chronic right-sided rib deformities. Postsurgical changes of the mediastinum. IMPRESSION: 1. Cardiomegaly without edema. Improved aeration at the bases compared to prior. 2. Small right chronic pleural effusion versus pleural thickening. Chronic interstitial lung opacities and bronchitic changes. Electronically Signed   By: Donavan Foil M.D.   On: 06/23/2020 17:48    Procedures Procedures stay.  Medications Ordered in ED Medications  cefTRIAXone (ROCEPHIN) 1 g in sodium chloride 0.9 % 100 mL IVPB (has no administration in time range)  metroNIDAZOLE (FLAGYL) IVPB 500 mg (has no administration in time range)  albuterol (VENTOLIN HFA) 108 (90 Base) MCG/ACT inhaler 2 puff (2 puffs Inhalation Given 06/23/20 2127)  benzonatate (TESSALON) capsule 200 mg (200 mg Oral Given 06/23/20 2127)  iohexol (OMNIPAQUE) 350 MG/ML injection 75 mL (75 mLs Intravenous Contrast Given 06/23/20 2152)    ED Course  I have reviewed the triage vital signs and the nursing notes.  Pertinent labs & imaging results that were available during my care of the patient were reviewed by me and considered in my medical decision making (see chart for details).    MDM Rules/Calculators/A&P                          Pt with shortness of breath and cough today, history of aspiration secondary to reflux and concern for pneumonia.  CXR is clear.  His also 3 weeks post CABG, denies chest pain except when coughing, no pleuritic pain.  His labs are stable.  Persistent low grade tachycardia, no hypoxia.  CT angio ordered given recent surgery to rule out PE given recent hospitalization and increased risk associated with this.   Ct imaging resulted with no PE but there is evidence of aspiration pneumonia.  Patient would benefit from at least an overnight  observation along with IV antibiotics and reassessment in the morning to ensure that this is not getting substantially worse.  Discussed these findings with the patient and he is agreeable to stay.  Flagyl and Rocephin were ordered to cover for aerobic and anaerobic bacteria.  Discussed with  Dr. Josephine Cables who accepts pt for admission. Final Clinical Impression(s) / ED Diagnoses Final diagnoses:  Aspiration pneumonia of both lungs due to regurgitated food, unspecified part of lung [facility])    Rx / DC Orders ED Discharge Orders     None        Landis Martins 06/23/20 2302    Fredia Sorrow, MD 06/28/20 1026

## 2020-06-23 NOTE — Telephone Encounter (Signed)
Patient called to say he is not feeling his normal self. Cant walk from one room to the other with out feeling tired. He took his bp and it was 118/78. Please advise

## 2020-06-23 NOTE — ED Notes (Signed)
Peaches, graham crackers and water given

## 2020-06-23 NOTE — ED Triage Notes (Signed)
Pt presents to ED for SOB started today. Pt with history of triple bypass 3 weeks ago. Pt with cough new this week.

## 2020-06-23 NOTE — Telephone Encounter (Signed)
Spoke with pt, he reports for the last week he has had fatigue that comes and goes. He reports last night he did not have enough energy to take a shower. He reports this is different from how he felt last week. He feels he is regressing. He denies, SOB, chest pain, palpitations or dizziness. Explained to the patient that not sure why he is tired. It could be the healing process since he is not having any symptoms. He has a follow up appointment with the surgeon on Monday and with cardiology on Wednesday. He would like for dr harding to know what is going on.

## 2020-06-24 ENCOUNTER — Encounter (HOSPITAL_COMMUNITY): Payer: Self-pay | Admitting: Internal Medicine

## 2020-06-24 DIAGNOSIS — I129 Hypertensive chronic kidney disease with stage 1 through stage 4 chronic kidney disease, or unspecified chronic kidney disease: Secondary | ICD-10-CM | POA: Diagnosis present

## 2020-06-24 DIAGNOSIS — Z79899 Other long term (current) drug therapy: Secondary | ICD-10-CM | POA: Diagnosis not present

## 2020-06-24 DIAGNOSIS — Z87442 Personal history of urinary calculi: Secondary | ICD-10-CM | POA: Diagnosis not present

## 2020-06-24 DIAGNOSIS — I2510 Atherosclerotic heart disease of native coronary artery without angina pectoris: Secondary | ICD-10-CM | POA: Diagnosis present

## 2020-06-24 DIAGNOSIS — Z881 Allergy status to other antibiotic agents status: Secondary | ICD-10-CM | POA: Diagnosis not present

## 2020-06-24 DIAGNOSIS — J690 Pneumonitis due to inhalation of food and vomit: Secondary | ICD-10-CM | POA: Diagnosis present

## 2020-06-24 DIAGNOSIS — K219 Gastro-esophageal reflux disease without esophagitis: Secondary | ICD-10-CM | POA: Diagnosis present

## 2020-06-24 DIAGNOSIS — Z7982 Long term (current) use of aspirin: Secondary | ICD-10-CM | POA: Diagnosis not present

## 2020-06-24 DIAGNOSIS — I252 Old myocardial infarction: Secondary | ICD-10-CM | POA: Diagnosis not present

## 2020-06-24 DIAGNOSIS — Z8701 Personal history of pneumonia (recurrent): Secondary | ICD-10-CM | POA: Diagnosis not present

## 2020-06-24 DIAGNOSIS — R9431 Abnormal electrocardiogram [ECG] [EKG]: Secondary | ICD-10-CM

## 2020-06-24 DIAGNOSIS — R0602 Shortness of breath: Secondary | ICD-10-CM | POA: Diagnosis present

## 2020-06-24 DIAGNOSIS — E785 Hyperlipidemia, unspecified: Secondary | ICD-10-CM | POA: Diagnosis present

## 2020-06-24 DIAGNOSIS — R911 Solitary pulmonary nodule: Secondary | ICD-10-CM

## 2020-06-24 DIAGNOSIS — N1831 Chronic kidney disease, stage 3a: Secondary | ICD-10-CM | POA: Diagnosis present

## 2020-06-24 DIAGNOSIS — D631 Anemia in chronic kidney disease: Secondary | ICD-10-CM | POA: Diagnosis present

## 2020-06-24 DIAGNOSIS — M810 Age-related osteoporosis without current pathological fracture: Secondary | ICD-10-CM | POA: Diagnosis present

## 2020-06-24 DIAGNOSIS — Z951 Presence of aortocoronary bypass graft: Secondary | ICD-10-CM | POA: Diagnosis not present

## 2020-06-24 DIAGNOSIS — Z87891 Personal history of nicotine dependence: Secondary | ICD-10-CM | POA: Diagnosis not present

## 2020-06-24 DIAGNOSIS — E876 Hypokalemia: Secondary | ICD-10-CM | POA: Diagnosis present

## 2020-06-24 DIAGNOSIS — Z7902 Long term (current) use of antithrombotics/antiplatelets: Secondary | ICD-10-CM | POA: Diagnosis not present

## 2020-06-24 DIAGNOSIS — Z823 Family history of stroke: Secondary | ICD-10-CM | POA: Diagnosis not present

## 2020-06-24 DIAGNOSIS — Z8501 Personal history of malignant neoplasm of esophagus: Secondary | ICD-10-CM | POA: Diagnosis not present

## 2020-06-24 DIAGNOSIS — Z8249 Family history of ischemic heart disease and other diseases of the circulatory system: Secondary | ICD-10-CM | POA: Diagnosis not present

## 2020-06-24 DIAGNOSIS — Z20822 Contact with and (suspected) exposure to covid-19: Secondary | ICD-10-CM | POA: Diagnosis present

## 2020-06-24 DIAGNOSIS — J449 Chronic obstructive pulmonary disease, unspecified: Secondary | ICD-10-CM | POA: Diagnosis present

## 2020-06-24 LAB — COMPREHENSIVE METABOLIC PANEL
ALT: 46 U/L — ABNORMAL HIGH (ref 0–44)
AST: 43 U/L — ABNORMAL HIGH (ref 15–41)
Albumin: 2.7 g/dL — ABNORMAL LOW (ref 3.5–5.0)
Alkaline Phosphatase: 99 U/L (ref 38–126)
Anion gap: 9 (ref 5–15)
BUN: 15 mg/dL (ref 8–23)
CO2: 24 mmol/L (ref 22–32)
Calcium: 8.6 mg/dL — ABNORMAL LOW (ref 8.9–10.3)
Chloride: 103 mmol/L (ref 98–111)
Creatinine, Ser: 1.05 mg/dL (ref 0.61–1.24)
GFR, Estimated: >60 mL/min (ref >60)
Glucose, Bld: 111 mg/dL — ABNORMAL HIGH (ref 70–99)
Potassium: 3.4 mmol/L — ABNORMAL LOW (ref 3.5–5.1)
Sodium: 136 mmol/L (ref 135–145)
Total Bilirubin: 0.1 mg/dL — ABNORMAL LOW (ref 0.3–1.2)
Total Protein: 7 g/dL (ref 6.5–8.1)

## 2020-06-24 LAB — PROTIME-INR
INR: 1.1 (ref 0.8–1.2)
Prothrombin Time: 14.6 seconds (ref 11.4–15.2)

## 2020-06-24 LAB — CBC
HCT: 32.8 % — ABNORMAL LOW (ref 39.0–52.0)
Hemoglobin: 10 g/dL — ABNORMAL LOW (ref 13.0–17.0)
MCH: 28 pg (ref 26.0–34.0)
MCHC: 30.5 g/dL (ref 30.0–36.0)
MCV: 91.9 fL (ref 80.0–100.0)
Platelets: 353 10*3/uL (ref 150–400)
RBC: 3.57 MIL/uL — ABNORMAL LOW (ref 4.22–5.81)
RDW: 14.7 % (ref 11.5–15.5)
WBC: 8.3 10*3/uL (ref 4.0–10.5)
nRBC: 0 % (ref 0.0–0.2)

## 2020-06-24 LAB — APTT: aPTT: 28 seconds (ref 24–36)

## 2020-06-24 LAB — PROCALCITONIN: Procalcitonin: <0.1 ng/mL

## 2020-06-24 LAB — PHOSPHORUS: Phosphorus: 3.9 mg/dL (ref 2.5–4.6)

## 2020-06-24 LAB — MAGNESIUM: Magnesium: 2 mg/dL (ref 1.7–2.4)

## 2020-06-24 MED ORDER — ALBUTEROL SULFATE (2.5 MG/3ML) 0.083% IN NEBU: 3.0000 mL | INHALATION_SOLUTION | Freq: Four times a day (QID) | RESPIRATORY_TRACT | Status: DC | PRN

## 2020-06-24 MED ORDER — ADULT MULTIVITAMIN W/MINERALS CH
1.0000 | ORAL_TABLET | Freq: Every day | ORAL | Status: DC
  Administered 2020-06-24 – 2020-06-25 (×2): 1 via ORAL
  Filled 2020-06-24 (×2): qty 1

## 2020-06-24 MED ORDER — IPRATROPIUM-ALBUTEROL 0.5-2.5 (3) MG/3ML IN SOLN
3.0000 mL | Freq: Three times a day (TID) | RESPIRATORY_TRACT | Status: DC
  Administered 2020-06-24 (×2): 3 mL via RESPIRATORY_TRACT
  Filled 2020-06-24 (×2): qty 3

## 2020-06-24 MED ORDER — PROSOURCE PLUS PO LIQD
30.0000 mL | Freq: Every day | ORAL | Status: DC
  Administered 2020-06-24: 30 mL via ORAL
  Filled 2020-06-24: qty 30

## 2020-06-24 MED ORDER — ASPIRIN EC 81 MG PO TBEC
81.0000 mg | DELAYED_RELEASE_TABLET | Freq: Every day | ORAL | Status: DC
  Administered 2020-06-24 – 2020-06-25 (×2): 81 mg via ORAL
  Filled 2020-06-24 (×2): qty 1

## 2020-06-24 MED ORDER — ROSUVASTATIN CALCIUM 20 MG PO TABS
40.0000 mg | ORAL_TABLET | Freq: Every day | ORAL | Status: DC
  Administered 2020-06-24: 40 mg via ORAL
  Filled 2020-06-24: qty 2

## 2020-06-24 MED ORDER — PANTOPRAZOLE SODIUM 40 MG PO TBEC
40.0000 mg | DELAYED_RELEASE_TABLET | Freq: Two times a day (BID) | ORAL | Status: DC
  Administered 2020-06-24 – 2020-06-25 (×3): 40 mg via ORAL
  Filled 2020-06-24 (×2): qty 1

## 2020-06-24 MED ORDER — DOXYCYCLINE HYCLATE 100 MG PO TABS
100.0000 mg | ORAL_TABLET | Freq: Two times a day (BID) | ORAL | Status: DC
  Administered 2020-06-24 – 2020-06-25 (×3): 100 mg via ORAL
  Filled 2020-06-24 (×3): qty 1

## 2020-06-24 MED ORDER — PROCHLORPERAZINE EDISYLATE 10 MG/2ML IJ SOLN: 10.0000 mg | Freq: Four times a day (QID) | INTRAMUSCULAR | Status: DC | PRN

## 2020-06-24 MED ORDER — SODIUM CHLORIDE 0.9 % IV SOLN
3.0000 g | Freq: Four times a day (QID) | INTRAVENOUS | Status: DC
  Administered 2020-06-24 – 2020-06-25 (×6): 3 g via INTRAVENOUS
  Filled 2020-06-24 (×11): qty 8

## 2020-06-24 MED ORDER — ENSURE ENLIVE PO LIQD: 237.0000 mL | Freq: Two times a day (BID) | ORAL | Status: DC

## 2020-06-24 MED ORDER — CLOPIDOGREL BISULFATE 75 MG PO TABS
75.0000 mg | ORAL_TABLET | Freq: Every day | ORAL | Status: DC
  Administered 2020-06-24 – 2020-06-25 (×2): 75 mg via ORAL
  Filled 2020-06-24 (×2): qty 1

## 2020-06-24 MED ORDER — PANTOPRAZOLE SODIUM 40 MG PO TBEC
40.0000 mg | DELAYED_RELEASE_TABLET | Freq: Every day | ORAL | Status: DC
  Filled 2020-06-24: qty 1

## 2020-06-24 MED ORDER — DM-GUAIFENESIN ER 30-600 MG PO TB12
1.0000 | ORAL_TABLET | Freq: Two times a day (BID) | ORAL | Status: DC
  Administered 2020-06-24 – 2020-06-25 (×3): 1 via ORAL
  Filled 2020-06-24 (×3): qty 1

## 2020-06-24 MED ORDER — POTASSIUM CHLORIDE IN NACL 20-0.9 MEQ/L-% IV SOLN: INTRAVENOUS | Status: AC

## 2020-06-24 MED ORDER — METOPROLOL TARTRATE 25 MG PO TABS
25.0000 mg | ORAL_TABLET | Freq: Two times a day (BID) | ORAL | Status: DC
  Administered 2020-06-24 – 2020-06-25 (×3): 25 mg via ORAL
  Filled 2020-06-24 (×3): qty 1

## 2020-06-24 MED ORDER — ACETAMINOPHEN 325 MG PO TABS: 650.0000 mg | ORAL_TABLET | Freq: Four times a day (QID) | ORAL | Status: DC | PRN

## 2020-06-24 MED ORDER — ENOXAPARIN SODIUM 40 MG/0.4ML IJ SOSY
40.0000 mg | PREFILLED_SYRINGE | INTRAMUSCULAR | Status: DC
  Filled 2020-06-24 (×2): qty 0.4

## 2020-06-24 MED ORDER — IPRATROPIUM-ALBUTEROL 0.5-2.5 (3) MG/3ML IN SOLN
3.0000 mL | Freq: Three times a day (TID) | RESPIRATORY_TRACT | Status: DC
  Administered 2020-06-25: 3 mL via RESPIRATORY_TRACT
  Filled 2020-06-24: qty 3

## 2020-06-24 NOTE — Progress Notes (Signed)
Initial Nutrition Assessment  DOCUMENTATION CODES:   Not applicable  INTERVENTION:  - continue Ensure Enlive BID, each supplement provides 350 kcal and 20 grams of protein. - will order 30 ml Prosource Plus once/day, each supplement provides 100 kcal and 15 grams protein.  - will order 1 tablet multivitamin with minerals/day. - complete NFPE when feasible.    NUTRITION DIAGNOSIS:   Unintentional weight loss related to acute illness, chronic illness as evidenced by percent weight loss.  GOAL:   Patient will meet greater than or equal to 90% of their needs  MONITOR:   PO intake, Supplement acceptance, Labs, Weight trends  REASON FOR ASSESSMENT:   Malnutrition Screening Tool  ASSESSMENT:   70 year old male with medical history of COPD, CAD s/p CABG on 06/02/20, HTN, HLD, esophageal cancer s/p partial esophagectomy with gastric pull-through in 1995, impaired glucose tolerance, and GERD. He presented to the ED d/t SOB that began on 06/21/20; he reported aspiration events on 6/12 and 6/14. He also reported non-productive cough.  Patient has not been seen by a [place] RD at any time in the past.   He ate 50% of breakfast today. Ensure Enlive ordered BID and patient accepted both bottles offered to him today.   Weight today is 154 lb and weight on 03/16/20 was 173 lb. This indicates 19 lb weight loss (11% body weight) in the past 3 months; significant for time frame. Non-pitting edema to BLE documented in the edema section of flow sheet.   Per notes: - aspiration PNA - CT angio chest showed LUL nodule with plan for outpatient surveillance  - hx of GERD - impaired glucose tolerance--HgbA1c on 06/01/20 was 6.2%   Labs reviewed; K: 3.4 mmol/l, Ca: 8.6 mg/dl. Medications reviewed; 40 mg oral protonix BID. IVF; NS-20 mEq KCl @ 75 ml/hr.    NUTRITION - FOCUSED PHYSICAL EXAM:  Unable to complete at this time.  Diet Order:   Diet Order             Diet Heart Room service  appropriate? Yes; Fluid consistency: Thin  Diet effective now                   EDUCATION NEEDS:   Not appropriate for education at this time  Skin:  Skin Assessment: Reviewed RN Assessment  Last BM:  PTA/unknown  Height:   Ht Readings from Last 1 Encounters:  06/24/20 5\' 5"  (1.651 m)    Weight:   Wt Readings from Last 1 Encounters:  06/24/20 70 kg     Estimated Nutritional Needs:  Kcal:  1750-1950 kcal Protein:  80-95 grams Fluid:  >/= 1.8 L/day      Jarome Matin, MS, RD, LDN, CNSC Inpatient Clinical Dietitian RD pager # available in [facility]  After hours/weekend pager # available in [facility]

## 2020-06-24 NOTE — Progress Notes (Signed)
PROGRESS NOTE  James Wolf ZCH:[id number] DOB: [birth year] DOA: 06/23/2020 PCP: Sherrilee Gilles, DO  Brief History:  70 year old male with a history of COPD, coronary artery disease status post CABG on 06/02/2020, hypertension, hyperlipidemia, esophageal cancer status post partial esophagectomy with gastric pull-through 1995, impaired glucose tolerance, GERD presenting with shortness of breath that began on 06/21/2020.  The patient states that he had an aspirational event on 06/18/2020 and 06/20/2020.  He began developing shortness of breath on 06/21/2020 that had progressed over the next 1 to 2 days.  He denies any fevers, chills, headache, chest pain, hemoptysis.  He did have a nonproductive cough.  He denies any nausea, vomiting, diarrhea, abdominal pain. Notably, patient was admitted to the hospital from 05/29/2020 to 06/08/2020 due to unstable angina and NSTEMI.  He underwent CABG x3 on 06/02/2020.  He was discharged home on 06/08/2020 in stable condition with metoprolol 100 mg twice daily and telmisartan as well as aspirin and Plavix. In the emergency department, the patient was afebrile although had soft blood pressures of 100/84.  Oxygen saturation was 95-97% on room air.  WBC 9.9, hemoglobin 9.2, platelets 292,000.  BMP was unremarkable with sodium 130, potassium 4.0, serum creatinine 1.17.  Assessment/Plan: Aspiration pneumonia -06/23/2020 CTA chest--negative PE; RLL and LLL patchy GGO;  LUL nodule -Continue Unasyn -Stable on room air -Start IV fluids judiciously -Continue PPI  GERD -Continue PPI  Coronary artery disease -S/p CABG x3 06/03/2018 -Continue aspirin Plavix -No chest pain presently -Continue statin -Continue metoprolol tartrate -Personally reviewed EKG--sinus rhythm, nonspecific T wave change  Essential hypertension -Restart metoprolol at lower dose given soft blood pressure -Holding telmisartan  Impaired glucose tolerance -06/01/2020 hemoglobin A1c  6.2  Dyslipidemia -Continue statin -LDL 99  Hypokalemia -Replete -Magnesium 2.2  LUL lung nodule -Continue outpatient surveillance  CKD stage IIIa -Baseline creatinine 1.0-1.3      Status is: Observation  The patient will require care spanning > 2 midnights and should be moved to inpatient because: IV treatments appropriate due to intensity of illness or inability to take PO  Dispo: The patient is from: Home              Anticipated d/c is to: Home              Patient currently is not medically stable to d/c.   Difficult to place patient No        Family Communication:   daughter updated 6/18  Consultants:  none  Code Status:  FULL  DVT Prophylaxis:  [place] Lovenox   Procedures: As Listed in Progress Note Above  Antibiotics: Unasyn 6/18>> Doxy 6/18>>      Subjective: Patient complains of shortness of breath with minimal exertion.  He has a nonproductive cough.  He denies fevers, chills, headache, chest pain, nausea, vomiting, diarrhea, abdominal pain.  Objective: Vitals:   06/23/20 2300 06/23/20 2339 06/24/20 0034 06/24/20 0544  BP:  113/77 107/78 121/82  Pulse: (!) 113 (!) 114 (!) 110 (!) 104  Resp: 18 17 18 18   Temp:   98.3 F (36.8 C) 98.2 F (36.8 C)  TempSrc:   Oral   SpO2: 94%  97% 95%  Weight:   70 kg   Height:   5\' 5"  (1.651 m)     Intake/Output Summary (Last 24 hours) at 06/24/2020 0843 Last data filed at 06/24/2020 0556 Gross per 24 hour  Intake 487.97 ml  Output --  Net 487.97  ml   Weight change:  Exam:  General:  Pt is alert, follows commands appropriately, not in acute distress HEENT: No icterus, No thrush, No neck mass, [place]/AT Cardiovascular: RRR, S1/S2, no rubs, no gallops Respiratory: Bilateral rales.  No wheezing.  Abdomen: Soft/+BS, non tender, non distended, no guarding Extremities: No edema, No lymphangitis, No petechiae, No rashes, no synovitis   Data Reviewed: I have personally reviewed following labs and  imaging studies Basic Metabolic Panel: Recent Labs  Lab 06/23/20 1703 06/24/20 0638  NA 138 136  K 4.0 3.4*  CL 103 103  CO2 26 24  GLUCOSE 130* 111*  BUN 17 15  CREATININE 1.17 1.05  CALCIUM 9.1 8.6*  MG  --  2.0  PHOS  --  3.9   Liver Function Tests: Recent Labs  Lab 06/24/20 0638  AST 43*  ALT 46*  ALKPHOS 99  BILITOT 0.1*  PROT 7.0  ALBUMIN 2.7*   No results for input(s): LIPASE, AMYLASE in the last 168 hours. No results for input(s): AMMONIA in the last 168 hours. Coagulation Profile: Recent Labs  Lab 06/24/20 0638  INR 1.1   CBC: Recent Labs  Lab 06/23/20 1703 06/24/20 0638  WBC 9.9 8.3  NEUTROABS 7.5  --   HGB 11.2* 10.0*  HCT 36.4* 32.8*  MCV 92.4 91.9  PLT 392 353   Cardiac Enzymes: No results for input(s): CKTOTAL, CKMB, CKMBINDEX, TROPONINI in the last 168 hours. BNP: Invalid input(s): POCBNP CBG: No results for input(s): GLUCAP in the last 168 hours. HbA1C: No results for input(s): HGBA1C in the last 72 hours. Urine analysis:    Component Value Date/Time   COLORURINE YELLOW 06/01/2020 1212   APPEARANCEUR HAZY (A) 06/01/2020 1212   LABSPEC 1.011 06/01/2020 1212   PHURINE 6.0 06/01/2020 1212   GLUCOSEU NEGATIVE 06/01/2020 1212   HGBUR NEGATIVE 06/01/2020 1212   BILIRUBINUR NEGATIVE 06/01/2020 1212   KETONESUR 5 (A) 06/01/2020 1212   PROTEINUR NEGATIVE 06/01/2020 1212   NITRITE NEGATIVE 06/01/2020 1212   [place] 06/01/2020 1212   Sepsis Labs: @LABRCNTIP (procalcitonin:4,lacticidven:4) ) Recent Results (from the past 240 hour(s))  Resp Panel by RT-PCR (Flu A&B, Covid) Nasopharyngeal Swab     Status: None   Collection Time: 06/23/20  6:50 PM   Specimen: Nasopharyngeal Swab; Nasopharyngeal(NP) swabs in vial transport medium  Result Value Ref Range Status   SARS Coronavirus 2 by RT PCR NEGATIVE NEGATIVE Final    Comment: (NOTE) SARS-CoV-2 target nucleic acids are NOT DETECTED.  The SARS-CoV-2 RNA is generally  detectable in upper respiratory specimens during the acute phase of infection. The lowest concentration of SARS-CoV-2 viral copies this assay can detect is 138 copies/mL. A negative result does not preclude SARS-Cov-2 infection and should not be used as the sole basis for treatment or other patient management decisions. A negative result may occur with  improper specimen collection/handling, submission of specimen other than nasopharyngeal swab, presence of viral mutation(s) within the areas targeted by this assay, and inadequate number of viral copies(<138 copies/mL). A negative result must be combined with clinical observations, patient history, and epidemiological information. The expected result is Negative.  Fact Sheet for Patients:  EntrepreneurPulse.com.au  Fact Sheet for Healthcare Providers:  IncredibleEmployment.be  This test is no t yet approved or cleared by the [place] FDA and  has been authorized for detection and/or diagnosis of SARS-CoV-2 by FDA under an Emergency Use Authorization (EUA). This EUA will remain  in effect (meaning this test can be used) for  the duration of the COVID-19 declaration under Section 564(b)(1) of the Act, 21 U.S.C.section 360bbb-3(b)(1), unless the authorization is terminated  or revoked sooner.       Influenza A by PCR NEGATIVE NEGATIVE Final   Influenza B by PCR NEGATIVE NEGATIVE Final    Comment: (NOTE) The Xpert Xpress SARS-CoV-2/FLU/RSV plus assay is intended as an aid in the diagnosis of influenza from Nasopharyngeal swab specimens and should not be used as a sole basis for treatment. Nasal washings and aspirates are unacceptable for Xpert Xpress SARS-CoV-2/FLU/RSV testing.  Fact Sheet for Patients: EntrepreneurPulse.com.au  Fact Sheet for Healthcare Providers: IncredibleEmployment.be  This test is not yet approved or cleared by the [place] FDA  and has been authorized for detection and/or diagnosis of SARS-CoV-2 by FDA under an Emergency Use Authorization (EUA). This EUA will remain in effect (meaning this test can be used) for the duration of the COVID-19 declaration under Section 564(b)(1) of the Act, 21 U.S.C. section 360bbb-3(b)(1), unless the authorization is terminated or revoked.  Performed at [facility], [address] [place], [place] [address]      Scheduled Meds:  aspirin EC  81 mg Oral Daily   clopidogrel  75 mg Oral Daily   dextromethorphan-guaiFENesin  1 tablet Oral BID   doxycycline  100 mg Oral Q12H   enoxaparin (LOVENOX) injection  40 mg Subcutaneous Q24H   feeding supplement  237 mL Oral BID BM   metoprolol tartrate  25 mg Oral BID   pantoprazole  40 mg Oral Daily   rosuvastatin  40 mg Oral q1800   Continuous Infusions:  ampicillin-sulbactam (UNASYN) IV Stopped (06/24/20 0556)    Procedures/Studies: DG Chest 1 View  Result Date: 06/06/2020 CLINICAL DATA:  Pleural effusion, recent CABG, status post thoracentesis EXAM: CHEST  1 VIEW COMPARISON:  06/06/2020, 5:59 a.m. FINDINGS: Interval reduction in volume of a left pleural effusion, now small, with associated atelectasis or consolidation. No pneumothorax. Unchanged small right pleural effusion. Cardiomegaly status post median sternotomy and CABG. IMPRESSION: Interval reduction in volume of a left pleural effusion, now small, with associated atelectasis or consolidation. No pneumothorax. Electronically Signed   By: Eddie Candle M.D.   On: 06/06/2020 15:05   DG Chest 2 View  Result Date: 06/07/2020 CLINICAL DATA:  CABG. EXAM: CHEST - 2 VIEW COMPARISON:  06/06/2020.  11/03/2018. FINDINGS: Mediastinum and hilar structures normal. Prior CABG. Cardiomegaly. No pulmonary venous congestion. Postsurgical changes right lung. Persistent bibasilar atelectasis and or scarring. Tiny bilateral pleural effusions versus pleural scarring again noted. Old right rib fractures  again noted. No pneumothorax. IMPRESSION: 1.  Prior CABG.  Stable cardiomegaly. 2. Postsurgical changes right lung. Persistent bibasilar and or scarring. Persistent small bilateral pleural effusions and or scarring. Chest is unchanged from prior exam. Electronically Signed   By: Marcello Moores  Register   On: 06/07/2020 07:12   DG Chest 2 View  Result Date: 06/06/2020 CLINICAL DATA:  70 year old male status post CABG postoperative day 4. EXAM: CHEST - 2 VIEW COMPARISON:  Chest radiographs 06/04/2020 and earlier. FINDINGS: Semi upright AP and lateral views of the chest today. Right IJ introducer sheath removed. Epicardial pacer wires remain. Chronic right rib fractures. Stable cardiac size and mediastinal contours. Previous gastric pull-through. No pneumothorax. Decreased pulmonary vascularity with no overt edema. Moderate left pleural effusion. Chronic right costophrenic angle blunting is stable. Stable visualized osseous structures. Paucity of bowel gas in the upper abdomen. IMPRESSION: 1. Right IJ introducer sheath removed. 2. Moderate size left pleural effusion with  lung base hypo-ventilation. Chronic pleural thickening suspected at the right costophrenic angle. Electronically Signed   By: Genevie Ann M.D.   On: 06/06/2020 06:14   DG Chest 2 View  Result Date: 05/29/2020 CLINICAL DATA:  Chest pain. Patient reports he went to [facility], left AMA. EXAM: CHEST - 2 VIEW COMPARISON:  Most recent comparison 11/29/2018, CT 07/20/2017 FINDINGS: Stable heart size and mediastinal contours. Gastric pull-through. Chronic bronchial thickening and hyperinflation. Patchy lung base opacity in the peripheries, left greater than right. No large pleural effusion. No pneumothorax. Chronic right apical pleuroparenchymal scarring. No acute osseous abnormalities are seen. Remote right rib fractures. IMPRESSION: 1. Patchy lung base opacity in the peripheries, left greater than right, may be atelectasis or pneumonia. 2. Chronic  bronchial thickening and hyperinflation. 3. Gastric pull-through. Electronically Signed   By: Keith Rake M.D.   On: 05/29/2020 18:20   CT Angio Chest PE W and/or Wo Contrast  Result Date: 06/23/2020 CLINICAL DATA:  Shortness of breath with exertion. PE suspected, high prob Patient reports aspiration 2 weeks ago.  Recent CABG. EXAM: CT ANGIOGRAPHY CHEST WITH CONTRAST TECHNIQUE: Multidetector CT imaging of the chest was performed using the standard protocol during bolus administration of intravenous contrast. Multiplanar CT image reconstructions and MIPs were obtained to evaluate the vascular anatomy. CONTRAST:  27mL OMNIPAQUE IOHEXOL 350 MG/ML SOLN COMPARISON:  Chest radiograph earlier today.  Chest CT 07/20/2017 FINDINGS: Cardiovascular: There are no filling defects within the pulmonary arteries to suggest pulmonary embolus. Pulmonary arteries are opacified to the distal segmental level. Subsegmental branches are not well assessed. Heart is borderline enlarged. Coronary artery calcifications, post CABG. The thoracic aorta is tortuous. No aortic aneurysm. Calcified is well as noncalcified atheromatous plaque with irregular plaque involving the descending thoracic aorta. No dissection or findings of acute aortic syndrome. Left vertebral artery arises directly from the thoracic aorta, variant anatomy minimal soft tissue thickening anterior to the heart likely related to recent surgery, no discrete fluid collection. Mediastinum/Nodes: Esophagectomy with gastric pull-through. Gastric pull-through is diffusely fluid-filled. There is mild narrowing of the diaphragmatic hiatus adjacent to surgical sutures. No enlarged mediastinal or hilar lymph nodes. No thyroid nodule. Lungs/Pleura: Ovoid smoothly marginated nodule in the left upper lobe measures 13 x 10 mm, series 6, image 54. There is compressive atelectasis as well as patchy ground-glass density in the dependent right lower lobe. Gastric pull-through causes  mild external compression on the segmental right lower lobe bronchi. No definite intraluminal debris in the tracheobronchial tree. There is a small left pleural effusion with adjacent compressive atelectasis. Minimal ground-glass density in the dependent left lower lobe. Minimal right pleural thickening without significant effusion. Upper Abdomen: No acute findings. Nonobstructing stone in the upper right kidney. Musculoskeletal: Chronic right rib deformity. Exaggerated thoracic kyphosis with mild multilevel chronic vertebral body wedging. Recent sternotomy but no sternal wires. Sternotomy is healing. Focal bone lesion. Review of the MIP images confirms the above findings. IMPRESSION: 1. No pulmonary embolus. 2. Esophagectomy with gastric pull-through. Gastric pull-through is diffusely fluid-filled. Mild narrowing of the diaphragmatic hiatus adjacent to surgical sutures. 3. Small left pleural effusion with adjacent compressive atelectasis. Patchy and ground-glass density in the dependent right greater than left lower lobe may be atelectasis, aspiration, or pneumonia. Given provided history, aspiration is favored. 4. Smoothly marginated 13 x 10 mm nodule in the left upper lobe. This may be a pulmonary hamartoma, however is new from 2019 exam. For nodule of this size consider either short interval chest CT follow-up  or PET characterization in 3 months. 5. Aortic atherosclerosis including calcified and irregular noncalcified atheromatous plaque. No acute aortic findings. Aortic Atherosclerosis (ICD10-I70.0). Electronically Signed   By: Keith Rake M.D.   On: 06/23/2020 22:06   CARDIAC CATHETERIZATION  Result Date: 05/30/2020  Ost LM to Dist LM lesion is 90% stenosed.  Mid LAD lesion is 90% stenosed.  Prox Cx lesion is 50% stenosed.  1st Mrg lesion is 70% stenosed.  Ost RCA to Prox RCA lesion is 50% stenosed.  Mid RCA lesion is 70% stenosed.  Dist RCA lesion is 70% stenosed.  LV end diastolic pressure  is normal.  1. 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 Normal LVEDP Plan: Echo to assess LV. Recommend CT surgery consultation for CABG.   DG Chest Port 1 View  Result Date: 06/23/2020 CLINICAL DATA:  Shortness of breath EXAM: PORTABLE CHEST 1 VIEW COMPARISON:  06/07/2020, CT 07/20/2017, radiograph 11/03/2018 FINDINGS: Chronic pleural effusion or thickening on the right. Postsurgical changes paralleling the right mediastinal silhouette. Coarse chronic interstitial opacities. Cardiomegaly. Hiatal hernia. No pneumothorax. Chronic right-sided rib deformities. Postsurgical changes of the mediastinum. IMPRESSION: 1. Cardiomegaly without edema. Improved aeration at the bases compared to prior. 2. Small right chronic pleural effusion versus pleural thickening. Chronic interstitial lung opacities and bronchitic changes. Electronically Signed   By: Donavan Foil M.D.   On: 06/23/2020 17:48   DG Chest Port 1 View  Result Date: 06/04/2020 CLINICAL DATA:  Status post CABG EXAM: PORTABLE CHEST 1 VIEW COMPARISON:  06/03/2020 FINDINGS: Removal of left chest tube and mediastinal drain. Removal of right IJ Swan-Ganz catheter. Stable right IJ venous sheath. Postsurgical changes in the right hemithorax. Small bilateral pleural effusions, left greater than right, with associated lower lobe atelectasis. 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 No pneumothorax. Cardiomegaly.  Postsurgical changes related to prior CABG. IMPRESSION: Interval removal of left chest tube, mediastinal drain, and right IJ Swan-Ganz catheter. No pneumothorax is seen. Small bilateral pleural effusions, left greater than right. Associated lower lobe atelectasis. Postsurgical changes related to prior CABG. Electronically Signed   By: Julian Hy M.D.   On: 06/04/2020 08:08   DG Chest Port 1 View  Result Date: 06/03/2020 CLINICAL DATA:  Status post CABG EXAM: PORTABLE CHEST 1 VIEW COMPARISON:  06/02/2020 FINDINGS:  Interval extubation. Stable right IJ Swan-Ganz catheter. Stable left chest tube and mediastinal drain. Platelike scarring in the left upper lobe. Small left pleural effusion, mildly increased. Trace right pleural effusion. Associated bilateral lower lobe opacities, likely atelectasis. Pulmonary vascular congestion without frank interstitial edema. Postsurgical changes in the right hemithorax. Cardiomegaly.  Postsurgical changes related to prior CABG. IMPRESSION: Interval extubation.  Additional support apparatus as above. No pneumothorax. Small bilateral pleural effusions, left greater than right, mildly increased. Associated lower lobe opacities, likely atelectasis. Electronically Signed   By: Julian Hy M.D.   On: 06/03/2020 08:35   DG Chest Port 1 View  Result Date: 06/02/2020 CLINICAL DATA:  Follow-up CABG EXAM: PORTABLE CHEST 1 VIEW COMPARISON:  05/29/2020 FINDINGS: Previous median sternotomy and CABG. Endotracheal tube tip 4 cm above the carina. Swan-Ganz catheter tip in the main pulmonary artery. Mediastinal drain in place. Left chest tube in place. No pneumothorax. Mild edema and mild basilar atelectasis. IMPRESSION: Lines and tubes well positioned. Mild edema and mild basilar atelectasis. No pneumothorax peer Electronically Signed   By: Nelson Chimes M.D.   On: 06/02/2020 14:27   ECHOCARDIOGRAM COMPLETE  Result Date: 05/31/2020  ECHOCARDIOGRAM REPORT   Patient Name:   MITCHELLE GOERNER Date of Exam: 05/31/2020 Medical Rec #:  [id number]        Height:       65.5 in Accession #:    [id number]       Weight:       167.1 lb Date of Birth:  [birth year]        BSA:          1.843 m Patient Age:    44 years         BP:           138/115 mmHg Patient Gender: M                HR:           77 bpm. Exam Location:  Inpatient Procedure: 2D Echo Indications:    chest pain  History:        Patient has no prior history of Echocardiogram examinations.                 CAD, COPD; Risk Factors:Hypertension.   Sonographer:    Johny Chess Referring Phys: [id number] PETER M [place] IMPRESSIONS  1. Left ventricular ejection fraction, by estimation, is 60 to 65%. The left ventricle has normal function. The left ventricle has no regional wall motion abnormalities. Left ventricular diastolic parameters were normal.  2. Right ventricular systolic function is normal. The right ventricular size is normal. There is normal pulmonary artery systolic pressure.  3. The mitral valve is normal in structure. No evidence of mitral valve regurgitation. No evidence of mitral stenosis.  4. The aortic valve is tricuspid. Aortic valve regurgitation is not visualized. Mild aortic valve sclerosis is present, with no evidence of aortic valve stenosis.  5. The inferior vena cava is normal in size with greater than 50% respiratory variability, suggesting right atrial pressure of 3 mmHg. FINDINGS  Left Ventricle: Left ventricular ejection fraction, by estimation, is 60 to 65%. The left ventricle has normal function. The left ventricle has no regional wall motion abnormalities. The left ventricular internal cavity size was normal in size. There is  no left ventricular hypertrophy. Left ventricular diastolic parameters were normal. Normal left ventricular filling pressure. Right Ventricle: The right ventricular size is normal. No increase in right ventricular wall thickness. Right ventricular systolic function is normal. There is normal pulmonary artery systolic pressure. The tricuspid regurgitant velocity is 2.48 m/s, and  with an assumed right atrial pressure of 3 mmHg, the estimated right ventricular systolic pressure is 76.2 mmHg. Left Atrium: Left atrial size was normal in size. Right Atrium: Right atrial size was normal in size. Pericardium: There is no evidence of pericardial effusion. Mitral Valve: The mitral valve is normal in structure. No evidence of mitral valve regurgitation. No evidence of mitral valve stenosis. Tricuspid Valve: The  tricuspid valve is normal in structure. Tricuspid valve regurgitation is mild . No evidence of tricuspid stenosis. Aortic Valve: The aortic valve is tricuspid. Aortic valve regurgitation is not visualized. Mild aortic valve sclerosis is present, with no evidence of aortic valve stenosis. Pulmonic Valve: The pulmonic valve was normal in structure. Pulmonic valve regurgitation is not visualized. No evidence of pulmonic stenosis. Aorta: The aortic root is normal in size and structure. Venous: The inferior vena cava is normal in size with greater than 50% respiratory variability, suggesting right atrial pressure of 3 mmHg. IAS/Shunts: No atrial level shunt detected by color flow Doppler.  LEFT VENTRICLE PLAX  2D LVIDd:         4.70 cm     Diastology LVIDs:         3.40 cm     LV e' medial:    8.16 cm/s LV PW:         0.80 cm     LV E/e' medial:  9.8 LV IVS:        0.80 cm     LV e' lateral:   9.90 cm/s LVOT diam:     1.90 cm     LV E/e' lateral: 8.1 LV SV:         45 LV SV Index:   24 LVOT Area:     2.84 cm  LV Volumes (MOD) LV vol d, MOD A4C: 72.8 ml LV vol s, MOD A4C: 37.0 ml LV SV MOD A4C:     72.8 ml RIGHT VENTRICLE             IVC RV S prime:     16.10 cm/s  IVC diam: 1.40 cm TAPSE (M-mode): 2.3 cm LEFT ATRIUM             Index       RIGHT ATRIUM           Index LA diam:        2.50 cm 1.36 cm/m  RA Area:     13.50 cm LA Vol (A2C):   40.3 ml 21.86 ml/m RA Volume:   32.10 ml  17.42 ml/m LA Vol (A4C):   46.0 ml 24.96 ml/m LA Biplane Vol: 45.1 ml 24.47 ml/m  AORTIC VALVE LVOT Vmax:   82.20 cm/s LVOT Vmean:  54.400 cm/s LVOT VTI:    0.159 m  AORTA Ao Root diam: 3.30 cm Ao Asc diam:  3.00 cm MITRAL VALVE               TRICUSPID VALVE MV Area (PHT): 3.85 cm    TR Peak grad:   24.6 mmHg MV Decel Time: 197 msec    TR Vmax:        248.00 cm/s MV E velocity: 79.70 cm/s MV A velocity: 79.30 cm/s  SHUNTS MV E/A ratio:  1.01        Systemic VTI:  0.16 m                            Systemic Diam: 1.90 cm Dani Gobble Croitoru MD  Electronically signed by Sanda Klein MD Signature Date/Time: 05/31/2020/1:59:53 PM    Final    VAS US DOPPLER PRE CABG  Result Date: 05/31/2020 PREOPERATIVE VASCULAR EVALUATION Patient Name:  JERREL TIBERIO  Date of Exam:   05/31/2020 Medical Rec #: [id number]         Accession #:    [id number] Date of Birth: June 09, 1950         Patient Gender: M Patient Age:   69Y Exam Location[facility]  [facility] Procedure:      VAS US DOPPLER PRE CABG Referring Phys: [place] --------------------------------------------------------------------------------  Indications:      Pre-CABG. Risk Factors:     Hypertension, hyperlipidemia, past history of smoking, prior                   MI, coronary artery disease. Comparison Study: No prior studies. Performing Technologist: Rogelia Rohrer RVT, RDMS Supporting Technologist: Darlin Coco RDMS,RVT  Examination Guidelines: A complete evaluation includes B-mode imaging, spectral Doppler, color  Doppler, and power Doppler as needed of all accessible portions of each vessel. Bilateral testing is considered an integral part of a complete examination. Limited examinations for reoccurring indications may be performed as noted.  Right Carotid Findings: +----------+--------+--------+--------+-------------------+--------------------+           PSV cm/sEDV cm/sStenosisDescribe           Comments             +----------+--------+--------+--------+-------------------+--------------------+ CCA Prox  83      15              heterogenous                            +----------+--------+--------+--------+-------------------+--------------------+ CCA Distal67      23              calcific                                +----------+--------+--------+--------+-------------------+--------------------+ ICA Prox  229     99      60-79%  hypoechoic,        Velocities may                                         heterogenous and   underestimate degree                                    irregular          of stenosis due to                                                        more proximal                                                             obstruction.         +----------+--------+--------+--------+-------------------+--------------------+ ICA Mid   246     118     80-99%  hypoechoic                              +----------+--------+--------+--------+-------------------+--------------------+ ICA Distal121     31                                                      +----------+--------+--------+--------+-------------------+--------------------+ ECA       135     20                                                      +----------+--------+--------+--------+-------------------+--------------------+  Portions of this table do not appear on this page. +----------+--------+-------+----------------+------------+           PSV cm/sEDV cmsDescribe        Arm Pressure +----------+--------+-------+----------------+------------+ Subclavian99             Multiphasic, WNL             +----------+--------+-------+----------------+------------+ +---------+--------+--+--------+--+---------+ VertebralPSV cm/s42EDV cm/s16Antegrade +---------+--------+--+--------+--+---------+ Left Carotid Findings: +----------+--------+--------+--------+------------+--------+           PSV cm/sEDV cm/sStenosisDescribe    Comments +----------+--------+--------+--------+------------+--------+ CCA Prox  92      35              heterogenous         +----------+--------+--------+--------+------------+--------+ CCA Distal76      29              heterogenous         +----------+--------+--------+--------+------------+--------+ ICA Prox  72      19      1-39%   calcific             +----------+--------+--------+--------+------------+--------+ ICA Mid   109     45              calcific              +----------+--------+--------+--------+------------+--------+ ICA Distal97      39                                   +----------+--------+--------+--------+------------+--------+ ECA       148     30                                   +----------+--------+--------+--------+------------+--------+ +----------+--------+--------+----------------+------------+ SubclavianPSV cm/sEDV cm/sDescribe        Arm Pressure +----------+--------+--------+----------------+------------+           127             Multiphasic, WNL             +----------+--------+--------+----------------+------------+ +---------+--------+--+--------+--+---------+ VertebralPSV cm/s66EDV cm/s20Antegrade +---------+--------+--+--------+--+---------+  ABI Findings: +---------+------------------+-----+----------+--------+ Right    Rt Pressure (mmHg)IndexWaveform  Comment  +---------+------------------+-----+----------+--------+ Brachial 132                    triphasic          +---------+------------------+-----+----------+--------+ PTA      102               0.76 monophasic         +---------+------------------+-----+----------+--------+ DP       118               0.87 monophasic         +---------+------------------+-----+----------+--------+ Great Toe91                0.67 Abnormal           +---------+------------------+-----+----------+--------+ +---------+------------------+-----+---------+-------+ Left     Lt Pressure (mmHg)IndexWaveform Comment +---------+------------------+-----+---------+-------+ Brachial 135                    triphasic        +---------+------------------+-----+---------+-------+ PTA      146               1.08 triphasic        +---------+------------------+-----+---------+-------+ DP       139  1.03 triphasic        +---------+------------------+-----+---------+-------+ [facility]                0.64 Abnormal          +---------+------------------+-----+---------+-------+  Right Doppler Findings: +--------+--------+-----+---------+--------+ Site    PressureIndexDoppler  Comments +--------+--------+-----+---------+--------+ [id number]          triphasic         +--------+--------+-----+---------+--------+ Radial               triphasic         +--------+--------+-----+---------+--------+ Ulnar                triphasic         +--------+--------+-----+---------+--------+  Left Doppler Findings: +--------+--------+-----+---------+--------+ Site    PressureIndexDoppler  Comments +--------+--------+-----+---------+--------+ [id number]          triphasic         +--------+--------+-----+---------+--------+ Radial               triphasic         +--------+--------+-----+---------+--------+ Ulnar                triphasic         +--------+--------+-----+---------+--------+  Summary: 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 Non-hemodynamically significant plaque <50% noted in                the CCA. Left Carotid: Velocities in the left ICA are consistent with a 1-39% stenosis.               Non-hemodynamically significant plaque <50% noted in the CCA. Vertebrals:  Bilateral vertebral arteries demonstrate antegrade flow. Subclavians: Normal flow hemodynamics were seen in bilateral subclavian              arteries. 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 The right toe-brachial index is abnormal. Left ABI: Resting left ankle-brachial index is within normal range. No evidence of significant left lower extremity arterial disease. The left toe-brachial index is abnormal. Right Upper Extremity: Doppler waveforms decrease >50% with right radial compression. Doppler waveforms remain within normal limits with right ulnar compression. Left Upper Extremity: Doppler waveforms remain  within normal limits with left radial compression. Doppler waveforms remain within normal limits with left ulnar compression.  Electronically signed by Harold Barban MD on 05/31/2020 at 9:20:48 PM.    Final    IR THORACENTESIS ASP PLEURAL SPACE W/IMG GUIDE  Result Date: 06/06/2020 INDICATION: Recent CABG, shortness of breast and left pleural effusion seen on CXR. Request for therapeutic thoracentesis. EXAM: ULTRASOUND GUIDED LEFT THORACENTESIS MEDICATIONS: 15 mL 1% lidocaine COMPLICATIONS: None immediate. PROCEDURE: An ultrasound guided thoracentesis was thoroughly discussed with the patient and questions answered. The benefits, risks, alternatives and complications were also discussed. The patient understands and wishes to proceed with the procedure. Written consent was obtained. Ultrasound was performed to localize and mark an adequate pocket of fluid in the left chest. The area was then prepped and draped in the normal sterile fashion. 1% Lidocaine was used for local anesthesia. Under ultrasound guidance a 6 Fr Safe-T-Centesis catheter was introduced. Thoracentesis was performed. The catheter was removed and a dressing applied. FINDINGS: A total of approximately 700 cc of bloody fluid was removed. Post procedure chest X-ray ordered. IMPRESSION: Successful ultrasound guided left thoracentesis yielding 700 cc of pleural fluid. Read by: Beatrice Lecher  [place], PA-C Electronically Signed   By: Miachel Roux M.D.   On: 06/06/2020 14:53    Orson Eva, DO  [facility]  If 7PM-7AM, please contact night-coverage [email or web address] Password [facility] 06/24/2020, 8:43 AM   LOS: 0 days

## 2020-06-25 LAB — BASIC METABOLIC PANEL
Anion gap: 8 (ref 5–15)
BUN: 13 mg/dL (ref 8–23)
CO2: 25 mmol/L (ref 22–32)
Calcium: 8.1 mg/dL — ABNORMAL LOW (ref 8.9–10.3)
Chloride: 107 mmol/L (ref 98–111)
Creatinine, Ser: 1.2 mg/dL (ref 0.61–1.24)
GFR, Estimated: >60 mL/min (ref >60)
Glucose, Bld: 120 mg/dL — ABNORMAL HIGH (ref 70–99)
Potassium: 3.9 mmol/L (ref 3.5–5.1)
Sodium: 140 mmol/L (ref 135–145)

## 2020-06-25 LAB — CBC
HCT: 30.7 % — ABNORMAL LOW (ref 39.0–52.0)
Hemoglobin: 9.2 g/dL — ABNORMAL LOW (ref 13.0–17.0)
MCH: 28.1 pg (ref 26.0–34.0)
MCHC: 30 g/dL (ref 30.0–36.0)
MCV: 93.9 fL (ref 80.0–100.0)
Platelets: 307 10*3/uL (ref 150–400)
RBC: 3.27 MIL/uL — ABNORMAL LOW (ref 4.22–5.81)
RDW: 14.6 % (ref 11.5–15.5)
WBC: 7.8 10*3/uL (ref 4.0–10.5)
nRBC: 0 % (ref 0.0–0.2)

## 2020-06-25 LAB — MAGNESIUM: Magnesium: 1.8 mg/dL (ref 1.7–2.4)

## 2020-06-25 MED ORDER — DOXYCYCLINE HYCLATE 100 MG PO TABS: 100.0000 mg | ORAL_TABLET | Freq: Two times a day (BID) | ORAL | 0 refills | Status: DC

## 2020-06-25 MED ORDER — AMOXICILLIN-POT CLAVULANATE 875-125 MG PO TABS: 1.0000 | ORAL_TABLET | Freq: Two times a day (BID) | ORAL | 0 refills | Status: DC

## 2020-06-25 MED ORDER — AMOXICILLIN-POT CLAVULANATE 875-125 MG PO TABS: 1.0000 | ORAL_TABLET | Freq: Two times a day (BID) | ORAL | Status: DC

## 2020-06-25 NOTE — Discharge Summary (Signed)
Physician Discharge Summary  James Wolf YYT:[id number] DOB: [birth year] DOA: 06/23/2020  PCP: Sherrilee Gilles, DO  Admit date: 06/23/2020 Discharge date: 06/25/2020  Admitted From: Home Disposition:  Home  Recommendations for Outpatient Follow-up:  Follow up with PCP in 1-2 weeks Please obtain BMP/CBC in one week   Discharge Condition: Stable CODE STATUS:FULL Diet recommendation: Heart Healthy   Brief/Interim Summary: 70 year old male with a history of COPD, coronary artery disease status post CABG on 06/02/2020, hypertension, hyperlipidemia, esophageal cancer status post partial esophagectomy with gastric pull-through 1995, impaired glucose tolerance, GERD presenting with shortness of breath that began on 06/21/2020.  The patient states that he had an aspirational event on 06/18/2020 and 06/20/2020.  He began developing shortness of breath on 06/21/2020 that had progressed over the next 1 to 2 days.  He denies any fevers, chills, headache, chest pain, hemoptysis.  He did have a nonproductive cough.  He denies any nausea, vomiting, diarrhea, abdominal pain. Notably, patient was admitted to the hospital from 05/29/2020 to 06/08/2020 due to unstable angina and NSTEMI.  He underwent CABG x3 on 06/02/2020.  He was discharged home on 06/08/2020 in stable condition with metoprolol 100 mg twice daily and telmisartan as well as aspirin and Plavix. In the emergency department, the patient was afebrile although had soft blood pressures of 100/84.  Oxygen saturation was 95-97% on room air.  WBC 9.9, hemoglobin 9.2, platelets 292,000.  BMP was unremarkable with sodium 130, potassium 4.0, serum creatinine 1.17  Discharge Diagnoses:   Aspiration pneumonia -06/23/2020 CTA chest--negative PE; RLL and LLL patchy GGO;  LUL nodule -Continue Unasyn>>d/c home with amox/clav x 6 more days -Stable on room air -Start IV fluids judiciously -Continue PPI   GERD -Continue PPI   Coronary artery disease -S/p CABG x3  06/03/2018 -Continue aspirin Plavix -No chest pain during hospitalization -Continue statin -Continue metoprolol tartrate -Personally reviewed EKG--sinus rhythm, nonspecific T wave change   Essential hypertension -Restart metoprolol at lower dose given soft blood pressure -Holding telmisartan -BP overall improved and now hypertensive again -restart metoprolol and telmisartan   Impaired glucose tolerance -06/01/2020 hemoglobin A1c 6.2   Dyslipidemia -Continue statin -LDL 99   Hypokalemia -Replete -Magnesium 2.2   LUL lung nodule -Continue outpatient surveillance   CKD stage IIIa -Baseline creatinine 1.0-1.3      Discharge Instructions   Allergies as of 06/25/2020       Reactions   Zithromax [azithromycin] Diarrhea   Causes pt's stomach to "tear up" does not want to take medication again.         Medication List     TAKE these medications    albuterol 108 (90 Base) MCG/ACT inhaler Commonly known as: VENTOLIN HFA Inhale 1-2 puffs into the lungs every 6 (six) hours as needed for wheezing or shortness of breath.   amoxicillin-clavulanate 875-125 MG tablet Commonly known as: AUGMENTIN Take 1 tablet by mouth every 12 (twelve) hours.   aspirin 81 MG EC tablet Take 1 tablet (81 mg total) by mouth daily. Swallow whole.   cholecalciferol 25 MCG (1000 UNIT) tablet Commonly known as: VITAMIN D3 Take 1,000 Units by mouth daily.   clopidogrel 75 MG tablet Commonly known as: PLAVIX Take 1 tablet (75 mg total) by mouth daily.   diclofenac sodium 1 % Gel Commonly known as: VOLTAREN Apply 2 g topically 2 (two) times daily as needed (pain).   doxycycline 100 MG tablet Commonly known as: VIBRA-TABS Take 1 tablet (100 mg total) by mouth every 12 (twelve) hours.  esomeprazole 40 MG capsule Commonly known as: NEXIUM Take 40 mg by mouth 2 (two) times daily.   metoprolol tartrate 100 MG tablet Commonly known as: LOPRESSOR Take 1 tablet (100 mg total) by mouth 2  (two) times daily.   rosuvastatin 40 MG tablet Commonly known as: CRESTOR Take 1 tablet (40 mg total) by mouth daily at 6 PM.   telmisartan 20 MG tablet Commonly known as: MICARDIS Take 20 mg by mouth daily.   VITAMIN B COMPLEX PO Take 1 tablet by mouth daily.        Allergies  Allergen Reactions   Zithromax [Azithromycin] Diarrhea    Causes pt's stomach to "tear up" does not want to take medication again.     Consultations: none   Procedures/Studies: DG Chest 1 View  Result Date: 06/06/2020 CLINICAL DATA:  Pleural effusion, recent CABG, status post thoracentesis EXAM: CHEST  1 VIEW COMPARISON:  06/06/2020, 5:59 a.m. FINDINGS: Interval reduction in volume of a left pleural effusion, now small, with associated atelectasis or consolidation. No pneumothorax. Unchanged small right pleural effusion. Cardiomegaly status post median sternotomy and CABG. IMPRESSION: Interval reduction in volume of a left pleural effusion, now small, with associated atelectasis or consolidation. No pneumothorax. Electronically Signed   By: Eddie Candle M.D.   On: 06/06/2020 15:05   DG Chest 2 View  Result Date: 06/07/2020 CLINICAL DATA:  CABG. EXAM: CHEST - 2 VIEW COMPARISON:  06/06/2020.  11/03/2018. FINDINGS: Mediastinum and hilar structures normal. Prior CABG. Cardiomegaly. No pulmonary venous congestion. Postsurgical changes right lung. Persistent bibasilar atelectasis and or scarring. Tiny bilateral pleural effusions versus pleural scarring again noted. Old right rib fractures again noted. No pneumothorax. IMPRESSION: 1.  Prior CABG.  Stable cardiomegaly. 2. Postsurgical changes right lung. Persistent bibasilar and or scarring. Persistent small bilateral pleural effusions and or scarring. Chest is unchanged from prior exam. Electronically Signed   By: Marcello Moores  Register   On: 06/07/2020 07:12   DG Chest 2 View  Result Date: 06/06/2020 CLINICAL DATA:  70 year old male status post CABG postoperative day  4. EXAM: CHEST - 2 VIEW COMPARISON:  Chest radiographs 06/04/2020 and earlier. FINDINGS: Semi upright AP and lateral views of the chest today. Right IJ introducer sheath removed. Epicardial pacer wires remain. Chronic right rib fractures. Stable cardiac size and mediastinal contours. Previous gastric pull-through. No pneumothorax. Decreased pulmonary vascularity with no overt edema. Moderate left pleural effusion. Chronic right costophrenic angle blunting is stable. Stable visualized osseous structures. Paucity of bowel gas in the upper abdomen. IMPRESSION: 1. Right IJ introducer sheath removed. 2. Moderate size left pleural effusion with lung base hypo-ventilation. Chronic pleural thickening suspected at the right costophrenic angle. Electronically Signed   By: Genevie Ann M.D.   On: 06/06/2020 06:14   DG Chest 2 View  Result Date: 05/29/2020 CLINICAL DATA:  Chest pain. Patient reports he went to [facility], left AMA. EXAM: CHEST - 2 VIEW COMPARISON:  Most recent comparison 11/29/2018, CT 07/20/2017 FINDINGS: Stable heart size and mediastinal contours. Gastric pull-through. Chronic bronchial thickening and hyperinflation. Patchy lung base opacity in the peripheries, left greater than right. No large pleural effusion. No pneumothorax. Chronic right apical pleuroparenchymal scarring. No acute osseous abnormalities are seen. Remote right rib fractures. IMPRESSION: 1. Patchy lung base opacity in the peripheries, left greater than right, may be atelectasis or pneumonia. 2. Chronic bronchial thickening and hyperinflation. 3. Gastric pull-through. Electronically Signed   By: Keith Rake M.D.   On: 05/29/2020 18:20   CT Angio  Chest PE W and/or Wo Contrast  Result Date: 06/23/2020 CLINICAL DATA:  Shortness of breath with exertion. PE suspected, high prob Patient reports aspiration 2 weeks ago.  Recent CABG. EXAM: CT ANGIOGRAPHY CHEST WITH CONTRAST TECHNIQUE: Multidetector CT imaging of the chest was  performed using the standard protocol during bolus administration of intravenous contrast. Multiplanar CT image reconstructions and MIPs were obtained to evaluate the vascular anatomy. CONTRAST:  22mL OMNIPAQUE IOHEXOL 350 MG/ML SOLN COMPARISON:  Chest radiograph earlier today.  Chest CT 07/20/2017 FINDINGS: Cardiovascular: There are no filling defects within the pulmonary arteries to suggest pulmonary embolus. Pulmonary arteries are opacified to the distal segmental level. Subsegmental branches are not well assessed. Heart is borderline enlarged. Coronary artery calcifications, post CABG. The thoracic aorta is tortuous. No aortic aneurysm. Calcified is well as noncalcified atheromatous plaque with irregular plaque involving the descending thoracic aorta. No dissection or findings of acute aortic syndrome. Left vertebral artery arises directly from the thoracic aorta, variant anatomy minimal soft tissue thickening anterior to the heart likely related to recent surgery, no discrete fluid collection. Mediastinum/Nodes: Esophagectomy with gastric pull-through. Gastric pull-through is diffusely fluid-filled. There is mild narrowing of the diaphragmatic hiatus adjacent to surgical sutures. No enlarged mediastinal or hilar lymph nodes. No thyroid nodule. Lungs/Pleura: Ovoid smoothly marginated nodule in the left upper lobe measures 13 x 10 mm, series 6, image 54. There is compressive atelectasis as well as patchy ground-glass density in the dependent right lower lobe. Gastric pull-through causes mild external compression on the segmental right lower lobe bronchi. No definite intraluminal debris in the tracheobronchial tree. There is a small left pleural effusion with adjacent compressive atelectasis. Minimal ground-glass density in the dependent left lower lobe. Minimal right pleural thickening without significant effusion. Upper Abdomen: No acute findings. Nonobstructing stone in the upper right kidney. Musculoskeletal:  Chronic right rib deformity. Exaggerated thoracic kyphosis with mild multilevel chronic vertebral body wedging. Recent sternotomy but no sternal wires. Sternotomy is healing. Focal bone lesion. Review of the MIP images confirms the above findings. IMPRESSION: 1. No pulmonary embolus. 2. Esophagectomy with gastric pull-through. Gastric pull-through is diffusely fluid-filled. Mild narrowing of the diaphragmatic hiatus adjacent to surgical sutures. 3. Small left pleural effusion with adjacent compressive atelectasis. Patchy and ground-glass density in the dependent right greater than left lower lobe may be atelectasis, aspiration, or pneumonia. Given provided history, aspiration is favored. 4. Smoothly marginated 13 x 10 mm nodule in the left upper lobe. This may be a pulmonary hamartoma, however is new from 2019 exam. For nodule of this size consider either short interval chest CT follow-up or PET characterization in 3 months. 5. Aortic atherosclerosis including calcified and irregular noncalcified atheromatous plaque. No acute aortic findings. Aortic Atherosclerosis (ICD10-I70.0). Electronically Signed   By: Keith Rake M.D.   On: 06/23/2020 22:06   CARDIAC CATHETERIZATION  Result Date: 05/30/2020  Ost LM to Dist LM lesion is 90% stenosed.  Mid LAD lesion is 90% stenosed.  Prox Cx lesion is 50% stenosed.  1st Mrg lesion is 70% stenosed.  Ost RCA to Prox RCA lesion is 50% stenosed.  Mid RCA lesion is 70% stenosed.  Dist RCA lesion is 70% stenosed.  LV end diastolic pressure is normal.  1. 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 Normal LVEDP Plan: Echo to assess LV. Recommend CT surgery consultation for CABG.   DG Chest Port 1 View  Result Date: 06/23/2020 CLINICAL DATA:  Shortness of breath EXAM: PORTABLE CHEST 1 VIEW COMPARISON:  06/07/2020, CT 07/20/2017, radiograph 11/03/2018 FINDINGS: Chronic pleural effusion or thickening on the right. Postsurgical changes paralleling the right  mediastinal silhouette. Coarse chronic interstitial opacities. Cardiomegaly. Hiatal hernia. No pneumothorax. Chronic right-sided rib deformities. Postsurgical changes of the mediastinum. IMPRESSION: 1. Cardiomegaly without edema. Improved aeration at the bases compared to prior. 2. Small right chronic pleural effusion versus pleural thickening. Chronic interstitial lung opacities and bronchitic changes. Electronically Signed   By: Donavan Foil M.D.   On: 06/23/2020 17:48   DG Chest Port 1 View  Result Date: 06/04/2020 CLINICAL DATA:  Status post CABG EXAM: PORTABLE CHEST 1 VIEW COMPARISON:  06/03/2020 FINDINGS: Removal of left chest tube and mediastinal drain. Removal of right IJ Swan-Ganz catheter. Stable right IJ venous sheath. Postsurgical changes in the right hemithorax. Small bilateral pleural effusions, left greater than right, with associated lower lobe atelectasis. 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 No pneumothorax. Cardiomegaly.  Postsurgical changes related to prior CABG. IMPRESSION: Interval removal of left chest tube, mediastinal drain, and right IJ Swan-Ganz catheter. No pneumothorax is seen. Small bilateral pleural effusions, left greater than right. Associated lower lobe atelectasis. Postsurgical changes related to prior CABG. Electronically Signed   By: Julian Hy M.D.   On: 06/04/2020 08:08   DG Chest Port 1 View  Result Date: 06/03/2020 CLINICAL DATA:  Status post CABG EXAM: PORTABLE CHEST 1 VIEW COMPARISON:  06/02/2020 FINDINGS: Interval extubation. Stable right IJ Swan-Ganz catheter. Stable left chest tube and mediastinal drain. Platelike scarring in the left upper lobe. Small left pleural effusion, mildly increased. Trace right pleural effusion. Associated bilateral lower lobe opacities, likely atelectasis. Pulmonary vascular congestion without frank interstitial edema. Postsurgical changes in the right hemithorax. Cardiomegaly.  Postsurgical changes  related to prior CABG. IMPRESSION: Interval extubation.  Additional support apparatus as above. No pneumothorax. Small bilateral pleural effusions, left greater than right, mildly increased. Associated lower lobe opacities, likely atelectasis. Electronically Signed   By: Julian Hy M.D.   On: 06/03/2020 08:35   DG Chest Port 1 View  Result Date: 06/02/2020 CLINICAL DATA:  Follow-up CABG EXAM: PORTABLE CHEST 1 VIEW COMPARISON:  05/29/2020 FINDINGS: Previous median sternotomy and CABG. Endotracheal tube tip 4 cm above the carina. Swan-Ganz catheter tip in the main pulmonary artery. Mediastinal drain in place. Left chest tube in place. No pneumothorax. Mild edema and mild basilar atelectasis. IMPRESSION: Lines and tubes well positioned. Mild edema and mild basilar atelectasis. No pneumothorax peer Electronically Signed   By: Nelson Chimes M.D.   On: 06/02/2020 14:27   ECHOCARDIOGRAM COMPLETE  Result Date: 05/31/2020    ECHOCARDIOGRAM REPORT   Patient Name:   James Wolf Date of Exam: 05/31/2020 Medical Rec #:  [id number]        Height:       65.5 in Accession #:    [id number]       Weight:       167.1 lb Date of Birth:  02-Jul-1950        BSA:          1.843 m Patient Age:    49 years         BP:           138/115 mmHg Patient Gender: M                HR:           77 bpm. Exam Location:  Inpatient Procedure: 2D Echo Indications:    chest pain  History:        Patient has no prior history of Echocardiogram examinations.                 CAD, COPD; Risk Factors:Hypertension.  Sonographer:    Johny Chess Referring Phys: [id number] PETER M [place] IMPRESSIONS  1. Left ventricular ejection fraction, by estimation, is 60 to 65%. The left ventricle has normal function. The left ventricle has no regional wall motion abnormalities. Left ventricular diastolic parameters were normal.  2. Right ventricular systolic function is normal. The right ventricular size is normal. There is normal pulmonary artery systolic  pressure.  3. The mitral valve is normal in structure. No evidence of mitral valve regurgitation. No evidence of mitral stenosis.  4. The aortic valve is tricuspid. Aortic valve regurgitation is not visualized. Mild aortic valve sclerosis is present, with no evidence of aortic valve stenosis.  5. The inferior vena cava is normal in size with greater than 50% respiratory variability, suggesting right atrial pressure of 3 mmHg. FINDINGS  Left Ventricle: Left ventricular ejection fraction, by estimation, is 60 to 65%. The left ventricle has normal function. The left ventricle has no regional wall motion abnormalities. The left ventricular internal cavity size was normal in size. There is  no left ventricular hypertrophy. Left ventricular diastolic parameters were normal. Normal left ventricular filling pressure. Right Ventricle: The right ventricular size is normal. No increase in right ventricular wall thickness. Right ventricular systolic function is normal. There is normal pulmonary artery systolic pressure. The tricuspid regurgitant velocity is 2.48 m/s, and  with an assumed right atrial pressure of 3 mmHg, the estimated right ventricular systolic pressure is 36.1 mmHg. Left Atrium: Left atrial size was normal in size. Right Atrium: Right atrial size was normal in size. Pericardium: There is no evidence of pericardial effusion. Mitral Valve: The mitral valve is normal in structure. No evidence of mitral valve regurgitation. No evidence of mitral valve stenosis. Tricuspid Valve: The tricuspid valve is normal in structure. Tricuspid valve regurgitation is mild . No evidence of tricuspid stenosis. Aortic Valve: The aortic valve is tricuspid. Aortic valve regurgitation is not visualized. Mild aortic valve sclerosis is present, with no evidence of aortic valve stenosis. Pulmonic Valve: The pulmonic valve was normal in structure. Pulmonic valve regurgitation is not visualized. No evidence of pulmonic stenosis. Aorta: The  aortic root is normal in size and structure. Venous: The inferior vena cava is normal in size with greater than 50% respiratory variability, suggesting right atrial pressure of 3 mmHg. IAS/Shunts: No atrial level shunt detected by color flow Doppler.  LEFT VENTRICLE PLAX 2D LVIDd:         4.70 cm     Diastology LVIDs:         3.40 cm     LV e' medial:    8.16 cm/s LV PW:         0.80 cm     LV E/e' medial:  9.8 LV IVS:        0.80 cm     LV e' lateral:   9.90 cm/s LVOT diam:     1.90 cm     LV E/e' lateral: 8.1 LV SV:         45 LV SV Index:   24 LVOT Area:     2.84 cm  LV Volumes (MOD) LV vol d, MOD A4C: 72.8 ml LV vol s, MOD A4C: 37.0 ml LV SV MOD A4C:     72.8 ml RIGHT VENTRICLE  IVC RV S prime:     16.10 cm/s  IVC diam: 1.40 cm TAPSE (M-mode): 2.3 cm LEFT ATRIUM             Index       RIGHT ATRIUM           Index LA diam:        2.50 cm 1.36 cm/m  RA Area:     13.50 cm LA Vol (A2C):   40.3 ml 21.86 ml/m RA Volume:   32.10 ml  17.42 ml/m LA Vol (A4C):   46.0 ml 24.96 ml/m LA Biplane Vol: 45.1 ml 24.47 ml/m  AORTIC VALVE LVOT Vmax:   82.20 cm/s LVOT Vmean:  54.400 cm/s LVOT VTI:    0.159 m  AORTA Ao Root diam: 3.30 cm Ao Asc diam:  3.00 cm MITRAL VALVE               TRICUSPID VALVE MV Area (PHT): 3.85 cm    TR Peak grad:   24.6 mmHg MV Decel Time: 197 msec    TR Vmax:        248.00 cm/s MV E velocity: 79.70 cm/s MV A velocity: 79.30 cm/s  SHUNTS MV E/A ratio:  1.01        Systemic VTI:  0.16 m                            Systemic Diam: 1.90 cm Dani Gobble Croitoru MD Electronically signed by Sanda Klein MD Signature Date/Time: 05/31/2020/1:59:53 PM    Final    VAS US DOPPLER PRE CABG  Result Date: 05/31/2020 PREOPERATIVE VASCULAR EVALUATION Patient Name:  James Wolf  Date of Exam:   05/31/2020 Medical Rec #: [id number]         Accession #:    [id number] Date of Birth: [birth year]         Patient Gender: M Patient Age:   69Y Exam Location:  [facility] Procedure:      VAS US DOPPLER  PRE CABG Referring Phys: [place] --------------------------------------------------------------------------------  Indications:      Pre-CABG. Risk Factors:     Hypertension, hyperlipidemia, past history of smoking, prior                   MI, coronary artery disease. Comparison Study: No prior studies. Performing Technologist: Rogelia Rohrer RVT, RDMS Supporting Technologist: Darlin Coco RDMS,RVT  Examination Guidelines: A complete evaluation includes B-mode imaging, spectral Doppler, color Doppler, and power Doppler as needed of all accessible portions of each vessel. Bilateral testing is considered an integral part of a complete examination. Limited examinations for reoccurring indications may be performed as noted.  Right Carotid Findings: +----------+--------+--------+--------+-------------------+--------------------+           PSV cm/sEDV cm/sStenosisDescribe           Comments             +----------+--------+--------+--------+-------------------+--------------------+ CCA Prox  83      15              heterogenous                            +----------+--------+--------+--------+-------------------+--------------------+ CCA Distal67      23              calcific                                +----------+--------+--------+--------+-------------------+--------------------+  ICA Prox  229     99      60-79%  hypoechoic,        Velocities may                                         heterogenous and   underestimate degree                                   irregular          of stenosis due to                                                        more proximal                                                             obstruction.         +----------+--------+--------+--------+-------------------+--------------------+ ICA Mid   246     118     80-99%  hypoechoic                               +----------+--------+--------+--------+-------------------+--------------------+ ICA Distal121     31                                                      +----------+--------+--------+--------+-------------------+--------------------+ ECA       135     20                                                      +----------+--------+--------+--------+-------------------+--------------------+ Portions of this table do not appear on this page. +----------+--------+-------+----------------+------------+           PSV cm/sEDV cmsDescribe        Arm Pressure +----------+--------+-------+----------------+------------+ Subclavian99             Multiphasic, WNL             +----------+--------+-------+----------------+------------+ +---------+--------+--+--------+--+---------+ VertebralPSV cm/s42EDV cm/s16Antegrade +---------+--------+--+--------+--+---------+ Left Carotid Findings: +----------+--------+--------+--------+------------+--------+           PSV cm/sEDV cm/sStenosisDescribe    Comments +----------+--------+--------+--------+------------+--------+ CCA Prox  92      35              heterogenous         +----------+--------+--------+--------+------------+--------+ CCA Distal76      29              heterogenous         +----------+--------+--------+--------+------------+--------+ ICA Prox  72      19      1-39%   calcific             +----------+--------+--------+--------+------------+--------+  ICA Mid   109     45              calcific             +----------+--------+--------+--------+------------+--------+ ICA Distal97      39                                   +----------+--------+--------+--------+------------+--------+ ECA       148     30                                   +----------+--------+--------+--------+------------+--------+ +----------+--------+--------+----------------+------------+ SubclavianPSV cm/sEDV cm/sDescribe         Arm Pressure +----------+--------+--------+----------------+------------+           127             Multiphasic, WNL             +----------+--------+--------+----------------+------------+ +---------+--------+--+--------+--+---------+ VertebralPSV cm/s66EDV cm/s20Antegrade +---------+--------+--+--------+--+---------+  ABI Findings: +---------+------------------+-----+----------+--------+ Right    Rt Pressure (mmHg)IndexWaveform  Comment  +---------+------------------+-----+----------+--------+ Brachial 132                    triphasic          +---------+------------------+-----+----------+--------+ PTA      102               0.76 monophasic         +---------+------------------+-----+----------+--------+ DP       118               0.87 monophasic         +---------+------------------+-----+----------+--------+ Great Toe91                0.67 Abnormal           +---------+------------------+-----+----------+--------+ +---------+------------------+-----+---------+-------+ Left     Lt Pressure (mmHg)IndexWaveform Comment +---------+------------------+-----+---------+-------+ Brachial 135                    triphasic        +---------+------------------+-----+---------+-------+ PTA      146               1.08 triphasic        +---------+------------------+-----+---------+-------+ DP       139               1.03 triphasic        +---------+------------------+-----+---------+-------+ Brion Aliment                0.64 Abnormal         +---------+------------------+-----+---------+-------+  Right Doppler Findings: +--------+--------+-----+---------+--------+ Site    PressureIndexDoppler  Comments +--------+--------+-----+---------+--------+ [id number]          triphasic         +--------+--------+-----+---------+--------+ Radial               triphasic         +--------+--------+-----+---------+--------+ Ulnar                 triphasic         +--------+--------+-----+---------+--------+  Left Doppler Findings: +--------+--------+-----+---------+--------+ Site    PressureIndexDoppler  Comments +--------+--------+-----+---------+--------+ [id number]          triphasic         +--------+--------+-----+---------+--------+ Radial               triphasic         +--------+--------+-----+---------+--------+  Ulnar                triphasic         +--------+--------+-----+---------+--------+  Summary: 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 Non-hemodynamically significant plaque <50% noted in                the CCA. Left Carotid: Velocities in the left ICA are consistent with a 1-39% stenosis.               Non-hemodynamically significant plaque <50% noted in the CCA. Vertebrals:  Bilateral vertebral arteries demonstrate antegrade flow. Subclavians: Normal flow hemodynamics were seen in bilateral subclavian              arteries. 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 The right toe-brachial index is abnormal. Left ABI: Resting left ankle-brachial index is within normal range. No evidence of significant left lower extremity arterial disease. The left toe-brachial index is abnormal. Right Upper Extremity: Doppler waveforms decrease >50% with right radial compression. Doppler waveforms remain within normal limits with right ulnar compression. Left Upper Extremity: Doppler waveforms remain within normal limits with left radial compression. Doppler waveforms remain within normal limits with left ulnar compression.  Electronically signed by Harold Barban MD on 05/31/2020 at 9:20:48 PM.    Final    IR THORACENTESIS ASP PLEURAL SPACE W/IMG GUIDE  Result Date: 06/06/2020 INDICATION: Recent CABG, shortness of breast and left pleural effusion seen on CXR. Request for therapeutic thoracentesis. EXAM:  ULTRASOUND GUIDED LEFT THORACENTESIS MEDICATIONS: 15 mL 1% lidocaine COMPLICATIONS: None immediate. PROCEDURE: An ultrasound guided thoracentesis was thoroughly discussed with the patient and questions answered. The benefits, risks, alternatives and complications were also discussed. The patient understands and wishes to proceed with the procedure. Written consent was obtained. Ultrasound was performed to localize and mark an adequate pocket of fluid in the left chest. The area was then prepped and draped in the normal sterile fashion. 1% Lidocaine was used for local anesthesia. Under ultrasound guidance a 6 Fr Safe-T-Centesis catheter was introduced. Thoracentesis was performed. The catheter was removed and a dressing applied. FINDINGS: A total of approximately 700 cc of bloody fluid was removed. Post procedure chest X-ray ordered. IMPRESSION: Successful ultrasound guided left thoracentesis yielding 700 cc of pleural fluid. Read by: Durenda Guthrie, PA-C Electronically Signed   By: Miachel Roux M.D.   On: 06/06/2020 14:53        Discharge Exam: Vitals:   06/25/20 0530 06/25/20 0727  BP: (!) 144/84   Pulse: (!) 101   Resp: 18   Temp: 98.1 F (36.7 C)   SpO2: 95% 95%   Vitals:   06/24/20 1944 06/24/20 2158 06/25/20 0530 06/25/20 0727  BP:  126/83 (!) 144/84   Pulse: 85 (!) 104 (!) 101   Resp: 18 18 18    Temp:  98.7 F (37.1 C) 98.1 F (36.7 C)   TempSrc:  Oral Oral   SpO2: 96% 97% 95% 95%  Weight:      Height:        General: Pt is alert, awake, not in acute distress Cardiovascular: RRR, S1/S2 +, no rubs, no gallops Respiratory: bibasilar rales. No wheeze Abdominal: Soft, NT, ND, bowel sounds + Extremities: no edema, no cyanosis   The results of significant diagnostics from this hospitalization (including imaging, microbiology, ancillary and laboratory) are  listed below for reference.    Significant Diagnostic Studies: DG Chest 1 View  Result Date: 06/06/2020 CLINICAL DATA:   Pleural effusion, recent CABG, status post thoracentesis EXAM: CHEST  1 VIEW COMPARISON:  06/06/2020, 5:59 a.m. FINDINGS: Interval reduction in volume of a left pleural effusion, now small, with associated atelectasis or consolidation. No pneumothorax. Unchanged small right pleural effusion. Cardiomegaly status post median sternotomy and CABG. IMPRESSION: Interval reduction in volume of a left pleural effusion, now small, with associated atelectasis or consolidation. No pneumothorax. Electronically Signed   By: Eddie Candle M.D.   On: 06/06/2020 15:05   DG Chest 2 View  Result Date: 06/07/2020 CLINICAL DATA:  CABG. EXAM: CHEST - 2 VIEW COMPARISON:  06/06/2020.  11/03/2018. FINDINGS: Mediastinum and hilar structures normal. Prior CABG. Cardiomegaly. No pulmonary venous congestion. Postsurgical changes right lung. Persistent bibasilar atelectasis and or scarring. Tiny bilateral pleural effusions versus pleural scarring again noted. Old right rib fractures again noted. No pneumothorax. IMPRESSION: 1.  Prior CABG.  Stable cardiomegaly. 2. Postsurgical changes right lung. Persistent bibasilar and or scarring. Persistent small bilateral pleural effusions and or scarring. Chest is unchanged from prior exam. Electronically Signed   By: Marcello Moores  Register   On: 06/07/2020 07:12   DG Chest 2 View  Result Date: 06/06/2020 CLINICAL DATA:  70 year old male status post CABG postoperative day 4. EXAM: CHEST - 2 VIEW COMPARISON:  Chest radiographs 06/04/2020 and earlier. FINDINGS: Semi upright AP and lateral views of the chest today. Right IJ introducer sheath removed. Epicardial pacer wires remain. Chronic right rib fractures. Stable cardiac size and mediastinal contours. Previous gastric pull-through. No pneumothorax. Decreased pulmonary vascularity with no overt edema. Moderate left pleural effusion. Chronic right costophrenic angle blunting is stable. Stable visualized osseous structures. Paucity of bowel gas in the upper  abdomen. IMPRESSION: 1. Right IJ introducer sheath removed. 2. Moderate size left pleural effusion with lung base hypo-ventilation. Chronic pleural thickening suspected at the right costophrenic angle. Electronically Signed   By: Genevie Ann M.D.   On: 06/06/2020 06:14   DG Chest 2 View  Result Date: 05/29/2020 CLINICAL DATA:  Chest pain. Patient reports he went to [facility], left AMA. EXAM: CHEST - 2 VIEW COMPARISON:  Most recent comparison 11/29/2018, CT 07/20/2017 FINDINGS: Stable heart size and mediastinal contours. Gastric pull-through. Chronic bronchial thickening and hyperinflation. Patchy lung base opacity in the peripheries, left greater than right. No large pleural effusion. No pneumothorax. Chronic right apical pleuroparenchymal scarring. No acute osseous abnormalities are seen. Remote right rib fractures. IMPRESSION: 1. Patchy lung base opacity in the peripheries, left greater than right, may be atelectasis or pneumonia. 2. Chronic bronchial thickening and hyperinflation. 3. Gastric pull-through. Electronically Signed   By: Keith Rake M.D.   On: 05/29/2020 18:20   CT Angio Chest PE W and/or Wo Contrast  Result Date: 06/23/2020 CLINICAL DATA:  Shortness of breath with exertion. PE suspected, high prob Patient reports aspiration 2 weeks ago.  Recent CABG. EXAM: CT ANGIOGRAPHY CHEST WITH CONTRAST TECHNIQUE: Multidetector CT imaging of the chest was performed using the standard protocol during bolus administration of intravenous contrast. Multiplanar CT image reconstructions and MIPs were obtained to evaluate the vascular anatomy. CONTRAST:  107mL OMNIPAQUE IOHEXOL 350 MG/ML SOLN COMPARISON:  Chest radiograph earlier today.  Chest CT 07/20/2017 FINDINGS: Cardiovascular: There are no filling defects within the pulmonary arteries to suggest pulmonary embolus. Pulmonary arteries are opacified to the distal segmental level. Subsegmental branches are not well assessed. Heart is borderline  enlarged. Coronary artery calcifications, post CABG. The thoracic aorta is tortuous. No aortic aneurysm. Calcified is well as noncalcified atheromatous plaque with irregular plaque involving the descending thoracic aorta. No dissection or findings of acute aortic syndrome. Left vertebral artery arises directly from the thoracic aorta, variant anatomy minimal soft tissue thickening anterior to the heart likely related to recent surgery, no discrete fluid collection. Mediastinum/Nodes: Esophagectomy with gastric pull-through. Gastric pull-through is diffusely fluid-filled. There is mild narrowing of the diaphragmatic hiatus adjacent to surgical sutures. No enlarged mediastinal or hilar lymph nodes. No thyroid nodule. Lungs/Pleura: Ovoid smoothly marginated nodule in the left upper lobe measures 13 x 10 mm, series 6, image 54. There is compressive atelectasis as well as patchy ground-glass density in the dependent right lower lobe. Gastric pull-through causes mild external compression on the segmental right lower lobe bronchi. No definite intraluminal debris in the tracheobronchial tree. There is a small left pleural effusion with adjacent compressive atelectasis. Minimal ground-glass density in the dependent left lower lobe. Minimal right pleural thickening without significant effusion. Upper Abdomen: No acute findings. Nonobstructing stone in the upper right kidney. Musculoskeletal: Chronic right rib deformity. Exaggerated thoracic kyphosis with mild multilevel chronic vertebral body wedging. Recent sternotomy but no sternal wires. Sternotomy is healing. Focal bone lesion. Review of the MIP images confirms the above findings. IMPRESSION: 1. No pulmonary embolus. 2. Esophagectomy with gastric pull-through. Gastric pull-through is diffusely fluid-filled. Mild narrowing of the diaphragmatic hiatus adjacent to surgical sutures. 3. Small left pleural effusion with adjacent compressive atelectasis. Patchy and ground-glass  density in the dependent right greater than left lower lobe may be atelectasis, aspiration, or pneumonia. Given provided history, aspiration is favored. 4. Smoothly marginated 13 x 10 mm nodule in the left upper lobe. This may be a pulmonary hamartoma, however is new from 2019 exam. For nodule of this size consider either short interval chest CT follow-up or PET characterization in 3 months. 5. Aortic atherosclerosis including calcified and irregular noncalcified atheromatous plaque. No acute aortic findings. Aortic Atherosclerosis (ICD10-I70.0). Electronically Signed   By: Keith Rake M.D.   On: 06/23/2020 22:06   CARDIAC CATHETERIZATION  Result Date: 05/30/2020  Ost LM to Dist LM lesion is 90% stenosed.  Mid LAD lesion is 90% stenosed.  Prox Cx lesion is 50% stenosed.  1st Mrg lesion is 70% stenosed.  Ost RCA to Prox RCA lesion is 50% stenosed.  Mid RCA lesion is 70% stenosed.  Dist RCA lesion is 70% stenosed.  LV end diastolic pressure is normal.  1. 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 Normal LVEDP Plan: Echo to assess LV. Recommend CT surgery consultation for CABG.   DG Chest Port 1 View  Result Date: 06/23/2020 CLINICAL DATA:  Shortness of breath EXAM: PORTABLE CHEST 1 VIEW COMPARISON:  06/07/2020, CT 07/20/2017, radiograph 11/03/2018 FINDINGS: Chronic pleural effusion or thickening on the right. Postsurgical changes paralleling the right mediastinal silhouette. Coarse chronic interstitial opacities. Cardiomegaly. Hiatal hernia. No pneumothorax. Chronic right-sided rib deformities. Postsurgical changes of the mediastinum. IMPRESSION: 1. Cardiomegaly without edema. Improved aeration at the bases compared to prior. 2. Small right chronic pleural effusion versus pleural thickening. Chronic interstitial lung opacities and bronchitic changes. Electronically Signed   By: Donavan Foil M.D.   On: 06/23/2020 17:48   DG Chest Port 1 View  Result Date: 06/04/2020 CLINICAL DATA:   Status post CABG EXAM: PORTABLE CHEST 1 VIEW COMPARISON:  06/03/2020 FINDINGS: Removal of left chest tube and mediastinal drain. Removal of right  IJ Swan-Ganz catheter. Stable right IJ venous sheath. Postsurgical changes in the right hemithorax. Small bilateral pleural effusions, left greater than right, with associated lower lobe atelectasis. 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 No pneumothorax. Cardiomegaly.  Postsurgical changes related to prior CABG. IMPRESSION: Interval removal of left chest tube, mediastinal drain, and right IJ Swan-Ganz catheter. No pneumothorax is seen. Small bilateral pleural effusions, left greater than right. Associated lower lobe atelectasis. Postsurgical changes related to prior CABG. Electronically Signed   By: Julian Hy M.D.   On: 06/04/2020 08:08   DG Chest Port 1 View  Result Date: 06/03/2020 CLINICAL DATA:  Status post CABG EXAM: PORTABLE CHEST 1 VIEW COMPARISON:  06/02/2020 FINDINGS: Interval extubation. Stable right IJ Swan-Ganz catheter. Stable left chest tube and mediastinal drain. Platelike scarring in the left upper lobe. Small left pleural effusion, mildly increased. Trace right pleural effusion. Associated bilateral lower lobe opacities, likely atelectasis. Pulmonary vascular congestion without frank interstitial edema. Postsurgical changes in the right hemithorax. Cardiomegaly.  Postsurgical changes related to prior CABG. IMPRESSION: Interval extubation.  Additional support apparatus as above. No pneumothorax. Small bilateral pleural effusions, left greater than right, mildly increased. Associated lower lobe opacities, likely atelectasis. Electronically Signed   By: Julian Hy M.D.   On: 06/03/2020 08:35   DG Chest Port 1 View  Result Date: 06/02/2020 CLINICAL DATA:  Follow-up CABG EXAM: PORTABLE CHEST 1 VIEW COMPARISON:  05/29/2020 FINDINGS: Previous median sternotomy and CABG. Endotracheal tube tip 4 cm above the carina.  Swan-Ganz catheter tip in the main pulmonary artery. Mediastinal drain in place. Left chest tube in place. No pneumothorax. Mild edema and mild basilar atelectasis. IMPRESSION: Lines and tubes well positioned. Mild edema and mild basilar atelectasis. No pneumothorax peer Electronically Signed   By: Nelson Chimes M.D.   On: 06/02/2020 14:27   ECHOCARDIOGRAM COMPLETE  Result Date: 05/31/2020    ECHOCARDIOGRAM REPORT   Patient Name:   EDAN JUDAY Date of Exam: 05/31/2020 Medical Rec #:  [id number]        Height:       65.5 in Accession #:    [id number]       Weight:       167.1 lb Date of Birth:  Jun 11, 1950        BSA:          1.843 m Patient Age:    70 years         BP:           138/115 mmHg Patient Gender: M                HR:           77 bpm. Exam Location:  Inpatient Procedure: 2D Echo Indications:    chest pain  History:        Patient has no prior history of Echocardiogram examinations.                 CAD, COPD; Risk Factors:Hypertension.  Sonographer:    Johny Chess Referring Phys: [id number] PETER M [place] IMPRESSIONS  1. Left ventricular ejection fraction, by estimation, is 60 to 65%. The left ventricle has normal function. The left ventricle has no regional wall motion abnormalities. Left ventricular diastolic parameters were normal.  2. Right ventricular systolic function is normal. The right ventricular size is normal. There is normal pulmonary artery systolic pressure.  3. The mitral valve is normal in structure. No evidence of mitral valve regurgitation. No  evidence of mitral stenosis.  4. The aortic valve is tricuspid. Aortic valve regurgitation is not visualized. Mild aortic valve sclerosis is present, with no evidence of aortic valve stenosis.  5. The inferior vena cava is normal in size with greater than 50% respiratory variability, suggesting right atrial pressure of 3 mmHg. FINDINGS  Left Ventricle: Left ventricular ejection fraction, by estimation, is 60 to 65%. The left ventricle has  normal function. The left ventricle has no regional wall motion abnormalities. The left ventricular internal cavity size was normal in size. There is  no left ventricular hypertrophy. Left ventricular diastolic parameters were normal. Normal left ventricular filling pressure. Right Ventricle: The right ventricular size is normal. No increase in right ventricular wall thickness. Right ventricular systolic function is normal. There is normal pulmonary artery systolic pressure. The tricuspid regurgitant velocity is 2.48 m/s, and  with an assumed right atrial pressure of 3 mmHg, the estimated right ventricular systolic pressure is 26.9 mmHg. Left Atrium: Left atrial size was normal in size. Right Atrium: Right atrial size was normal in size. Pericardium: There is no evidence of pericardial effusion. Mitral Valve: The mitral valve is normal in structure. No evidence of mitral valve regurgitation. No evidence of mitral valve stenosis. Tricuspid Valve: The tricuspid valve is normal in structure. Tricuspid valve regurgitation is mild . No evidence of tricuspid stenosis. Aortic Valve: The aortic valve is tricuspid. Aortic valve regurgitation is not visualized. Mild aortic valve sclerosis is present, with no evidence of aortic valve stenosis. Pulmonic Valve: The pulmonic valve was normal in structure. Pulmonic valve regurgitation is not visualized. No evidence of pulmonic stenosis. Aorta: The aortic root is normal in size and structure. Venous: The inferior vena cava is normal in size with greater than 50% respiratory variability, suggesting right atrial pressure of 3 mmHg. IAS/Shunts: No atrial level shunt detected by color flow Doppler.  LEFT VENTRICLE PLAX 2D LVIDd:         4.70 cm     Diastology LVIDs:         3.40 cm     LV e' medial:    8.16 cm/s LV PW:         0.80 cm     LV E/e' medial:  9.8 LV IVS:        0.80 cm     LV e' lateral:   9.90 cm/s LVOT diam:     1.90 cm     LV E/e' lateral: 8.1 LV SV:         45 LV SV  Index:   24 LVOT Area:     2.84 cm  LV Volumes (MOD) LV vol d, MOD A4C: 72.8 ml LV vol s, MOD A4C: 37.0 ml LV SV MOD A4C:     72.8 ml RIGHT VENTRICLE             IVC RV S prime:     16.10 cm/s  IVC diam: 1.40 cm TAPSE (M-mode): 2.3 cm LEFT ATRIUM             Index       RIGHT ATRIUM           Index LA diam:        2.50 cm 1.36 cm/m  RA Area:     13.50 cm LA Vol (A2C):   40.3 ml 21.86 ml/m RA Volume:   32.10 ml  17.42 ml/m LA Vol (A4C):   46.0 ml 24.96 ml/m LA Biplane Vol: 45.1 ml 24.47  ml/m  AORTIC VALVE LVOT Vmax:   82.20 cm/s LVOT Vmean:  54.400 cm/s LVOT VTI:    0.159 m  AORTA Ao Root diam: 3.30 cm Ao Asc diam:  3.00 cm MITRAL VALVE               TRICUSPID VALVE MV Area (PHT): 3.85 cm    TR Peak grad:   24.6 mmHg MV Decel Time: 197 msec    TR Vmax:        248.00 cm/s MV E velocity: 79.70 cm/s MV A velocity: 79.30 cm/s  SHUNTS MV E/A ratio:  1.01        Systemic VTI:  0.16 m                            Systemic Diam: 1.90 cm Dani Gobble Croitoru MD Electronically signed by Sanda Klein MD Signature Date/Time: 05/31/2020/1:59:53 PM    Final    VAS US DOPPLER PRE CABG  Result Date: 05/31/2020 PREOPERATIVE VASCULAR EVALUATION Patient Name:  James Wolf  Date of Exam:   05/31/2020 Medical Rec #: [id number]         Accession #:    [id number] Date of Birth: [birth year]         Patient Gender: M Patient Age:   69Y Exam Location:  [facility] Procedure:      VAS US DOPPLER PRE CABG Referring Phys: Bel[place] --------------------------------------------------------------------------------  Indications:      Pre-CABG. Risk Factors:     Hypertension, hyperlipidemia, past history of smoking, prior                   MI, coronary artery disease. Comparison Study: No prior studies. Performing Technologist: Rogelia Rohrer RVT, RDMS Supporting Technologist: Darlin Coco RDMS,RVT  Examination Guidelines: A complete evaluation includes B-mode imaging, spectral Doppler, color Doppler, and power Doppler as needed  of all accessible portions of each vessel. Bilateral testing is considered an integral part of a complete examination. Limited examinations for reoccurring indications may be performed as noted.  Right Carotid Findings: +----------+--------+--------+--------+-------------------+--------------------+           PSV cm/sEDV cm/sStenosisDescribe           Comments             +----------+--------+--------+--------+-------------------+--------------------+ CCA Prox  83      15              heterogenous                            +----------+--------+--------+--------+-------------------+--------------------+ CCA Distal67      23              calcific                                +----------+--------+--------+--------+-------------------+--------------------+ ICA Prox  229     99      60-79%  hypoechoic,        Velocities may                                         heterogenous and   underestimate degree  irregular          of stenosis due to                                                        more proximal                                                             obstruction.         +----------+--------+--------+--------+-------------------+--------------------+ ICA Mid   246     118     80-99%  hypoechoic                              +----------+--------+--------+--------+-------------------+--------------------+ ICA Distal121     31                                                      +----------+--------+--------+--------+-------------------+--------------------+ ECA       135     20                                                      +----------+--------+--------+--------+-------------------+--------------------+ Portions of this table do not appear on this page. +----------+--------+-------+----------------+------------+           PSV cm/sEDV cmsDescribe        Arm Pressure  +----------+--------+-------+----------------+------------+ Subclavian99             Multiphasic, WNL             +----------+--------+-------+----------------+------------+ +---------+--------+--+--------+--+---------+ VertebralPSV cm/s42EDV cm/s16Antegrade +---------+--------+--+--------+--+---------+ Left Carotid Findings: +----------+--------+--------+--------+------------+--------+           PSV cm/sEDV cm/sStenosisDescribe    Comments +----------+--------+--------+--------+------------+--------+ CCA Prox  92      35              heterogenous         +----------+--------+--------+--------+------------+--------+ CCA Distal76      29              heterogenous         +----------+--------+--------+--------+------------+--------+ ICA Prox  72      19      1-39%   calcific             +----------+--------+--------+--------+------------+--------+ ICA Mid   109     45              calcific             +----------+--------+--------+--------+------------+--------+ ICA Distal97      39                                   +----------+--------+--------+--------+------------+--------+ ECA       148     30                                   +----------+--------+--------+--------+------------+--------+ +----------+--------+--------+----------------+------------+  SubclavianPSV cm/sEDV cm/sDescribe        Arm Pressure +----------+--------+--------+----------------+------------+           127             Multiphasic, WNL             +----------+--------+--------+----------------+------------+ +---------+--------+--+--------+--+---------+ VertebralPSV cm/s66EDV cm/s20Antegrade +---------+--------+--+--------+--+---------+  ABI Findings: +---------+------------------+-----+----------+--------+ Right    Rt Pressure (mmHg)IndexWaveform  Comment  +---------+------------------+-----+----------+--------+ Brachial 132                    triphasic           +---------+------------------+-----+----------+--------+ PTA      102               0.76 monophasic         +---------+------------------+-----+----------+--------+ DP       118               0.87 monophasic         +---------+------------------+-----+----------+--------+ Great Toe91                0.67 Abnormal           +---------+------------------+-----+----------+--------+ +---------+------------------+-----+---------+-------+ Left     Lt Pressure (mmHg)IndexWaveform Comment +---------+------------------+-----+---------+-------+ Brachial 135                    triphasic        +---------+------------------+-----+---------+-------+ PTA      146               1.08 triphasic        +---------+------------------+-----+---------+-------+ DP       139               1.03 triphasic        +---------+------------------+-----+---------+-------+ Brion Aliment                0.64 Abnormal         +---------+------------------+-----+---------+-------+  Right Doppler Findings: +--------+--------+-----+---------+--------+ Site    PressureIndexDoppler  Comments +--------+--------+-----+---------+--------+ [id number]          triphasic         +--------+--------+-----+---------+--------+ Radial               triphasic         +--------+--------+-----+---------+--------+ Ulnar                triphasic         +--------+--------+-----+---------+--------+  Left Doppler Findings: +--------+--------+-----+---------+--------+ Site    PressureIndexDoppler  Comments +--------+--------+-----+---------+--------+ [id number]          triphasic         +--------+--------+-----+---------+--------+ Radial               triphasic         +--------+--------+-----+---------+--------+ Ulnar                triphasic         +--------+--------+-----+---------+--------+  Summary: Right Carotid: Velocities in the right ICA are consistent with a  60-79%                stenosis. Non-hemodynamically significant plaque <50% noted in                the CCA. Left Carotid: Velocities in the left ICA are consistent with a 1-39% stenosis.               Non-hemodynamically significant plaque <50% noted in the CCA. Vertebrals:  Bilateral vertebral arteries demonstrate antegrade flow. Subclavians: Normal flow hemodynamics  were seen in bilateral subclavian              arteries. 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 The right toe-brachial index is abnormal. Left ABI: Resting left ankle-brachial index is within normal range. No evidence of significant left lower extremity arterial disease. The left toe-brachial index is abnormal. Right Upper Extremity: Doppler waveforms decrease >50% with right radial compression. Doppler waveforms remain within normal limits with right ulnar compression. Left Upper Extremity: Doppler waveforms remain within normal limits with left radial compression. Doppler waveforms remain within normal limits with left ulnar compression.  Electronically signed by Harold Barban MD on 05/31/2020 at 9:20:48 PM.    Final    IR THORACENTESIS ASP PLEURAL SPACE W/IMG GUIDE  Result Date: 06/06/2020 INDICATION: Recent CABG, shortness of breast and left pleural effusion seen on CXR. Request for therapeutic thoracentesis. EXAM: ULTRASOUND GUIDED LEFT THORACENTESIS MEDICATIONS: 15 mL 1% lidocaine COMPLICATIONS: None immediate. PROCEDURE: An ultrasound guided thoracentesis was thoroughly discussed with the patient and questions answered. The benefits, risks, alternatives and complications were also discussed. The patient understands and wishes to proceed with the procedure. Written consent was obtained. Ultrasound was performed to localize and mark an adequate pocket of fluid in the left chest. The area was then prepped and draped in the normal sterile fashion. 1% Lidocaine was used for local  anesthesia. Under ultrasound guidance a 6 Fr Safe-T-Centesis catheter was introduced. Thoracentesis was performed. The catheter was removed and a dressing applied. FINDINGS: A total of approximately 700 cc of bloody fluid was removed. Post procedure chest X-ray ordered. IMPRESSION: Successful ultrasound guided left thoracentesis yielding 700 cc of pleural fluid. Read by: Durenda Guthrie, PA-C Electronically Signed   By: Miachel Roux M.D.   On: 06/06/2020 14:53    Microbiology: Recent Results (from the past 240 hour(s))  Resp Panel by RT-PCR (Flu A&B, Covid) Nasopharyngeal Swab     Status: None   Collection Time: 06/23/20  6:50 PM   Specimen: Nasopharyngeal Swab; Nasopharyngeal(NP) swabs in vial transport medium  Result Value Ref Range Status   SARS Coronavirus 2 by RT PCR NEGATIVE NEGATIVE Final    Comment: (NOTE) SARS-CoV-2 target nucleic acids are NOT DETECTED.  The SARS-CoV-2 RNA is generally detectable in upper respiratory specimens during the acute phase of infection. The lowest concentration of SARS-CoV-2 viral copies this assay can detect is 138 copies/mL. A negative result does not preclude SARS-Cov-2 infection and should not be used as the sole basis for treatment or other patient management decisions. A negative result may occur with  improper specimen collection/handling, submission of specimen other than nasopharyngeal swab, presence of viral mutation(s) within the areas targeted by this assay, and inadequate number of viral copies(<138 copies/mL). A negative result must be combined with clinical observations, patient history, and epidemiological information. The expected result is Negative.  Fact Sheet for Patients:  EntrepreneurPulse.com.au  Fact Sheet for Healthcare Providers:  IncredibleEmployment.be  This test is no t yet approved or cleared by the [place] FDA and  has been authorized for detection and/or diagnosis of SARS-CoV-2  by FDA under an Emergency Use Authorization (EUA). This EUA will remain  in effect (meaning this test can be used) for the duration of the COVID-19 declaration under Section 564(b)(1) of the Act, 21 U.S.C.section 360bbb-3(b)(1), unless the authorization is terminated  or revoked sooner.       Influenza A by PCR NEGATIVE NEGATIVE Final   Influenza B by PCR  NEGATIVE NEGATIVE Final    Comment: (NOTE) The Xpert Xpress SARS-CoV-2/FLU/RSV plus assay is intended as an aid in the diagnosis of influenza from Nasopharyngeal swab specimens and should not be used as a sole basis for treatment. Nasal washings and aspirates are unacceptable for Xpert Xpress SARS-CoV-2/FLU/RSV testing.  Fact Sheet for Patients: EntrepreneurPulse.com.au  Fact Sheet for Healthcare Providers: IncredibleEmployment.be  This test is not yet approved or cleared by the [place] FDA and has been authorized for detection and/or diagnosis of SARS-CoV-2 by FDA under an Emergency Use Authorization (EUA). This EUA will remain in effect (meaning this test can be used) for the duration of the COVID-19 declaration under Section 564(b)(1) of the Act, 21 U.S.C. section 360bbb-3(b)(1), unless the authorization is terminated or revoked.  Performed at [facility], [address] [place], [place] [address]      Labs: Basic Metabolic Panel: Recent Labs  Lab 06/23/20 1703 06/24/20 0638 06/25/20 0625  NA 138 136 140  K 4.0 3.4* 3.9  CL 103 103 107  CO2 26 24 25   GLUCOSE 130* 111* 120*  BUN 17 15 13   CREATININE 1.17 1.05 1.20  CALCIUM 9.1 8.6* 8.1*  MG  --  2.0 1.8  PHOS  --  3.9  --    Liver Function Tests: Recent Labs  Lab 06/24/20 0638  AST 43*  ALT 46*  ALKPHOS 99  BILITOT 0.1*  PROT 7.0  ALBUMIN 2.7*   No results for input(s): LIPASE, AMYLASE in the last 168 hours. No results for input(s): AMMONIA in the last 168 hours. CBC: Recent Labs  Lab 06/23/20 1703  06/24/20 0638 06/25/20 0625  WBC 9.9 8.3 7.8  NEUTROABS 7.5  --   --   HGB 11.2* 10.0* 9.2*  HCT 36.4* 32.8* 30.7*  MCV 92.4 91.9 93.9  PLT 392 353 307   Cardiac Enzymes: No results for input(s): CKTOTAL, CKMB, CKMBINDEX, TROPONINI in the last 168 hours. BNP: Invalid input(s): POCBNP CBG: No results for input(s): GLUCAP in the last 168 hours.  Time coordinating discharge:  36 minutes  Signed:  Orson Eva, DO [facility] Pager: [phone] 06/25/2020, 12:38 PM

## 2020-06-26 NOTE — Telephone Encounter (Signed)
Patient was seen in the er over the weekend.

## 2020-06-28 ENCOUNTER — Ambulatory Visit: Payer: BC Managed Care – PPO | Admitting: Physician Assistant

## 2020-07-03 NOTE — Progress Notes (Signed)
Cardiology Office Note  Date: 07/04/2020   ID: James Wolf, DOB [birth year], MRN [id number]  PCP:  Sherrilee Gilles, DO  Cardiologist:  Carlyle Dolly, MD Electrophysiologist:  None   Chief Complaint: Status post CABG / Not feeling well.   History of Present Illness: James Wolf is a 70 y.o. male with a history of COPD, chest pain, esophageal cancer, aspiration pneumonitis, GERD, hyperlipidemia, anemia, NSTEMI, previous tobacco use, status post CABG x3, hypokalemia, leukocytosis.  Recent hospital admission on 05/29/2020 with history of several days of unstable angina consistent with NSTEMI.  He had undergone left rotator cuff repair several months prior.  He had recently been doing some exercises with a dumbbell and developed burning substernal chest pain radiating across left chest to left shoulder and neck.  The next day he was lifting a heavy item and had similar symptoms relieved with rest.  Later in the evening he had recurrent prolonged episode of substernal chest pain and sought medical attention at the emergency department in [place].  His baseline EKG was unremarkable and he had elevated troponin.  He subsequently left AMA and presented to [facility] for further evaluation the next day due to continued chest pain/chest burning.  He was transferred to [facility] and underwent diagnostic cardiac catheterization by Dr. Martinique with critical left main and multivessel CAD.  CTS was consulted and felt patient would benefit from coronary bypass.  He underwent CABG x3 (LIMA-LAD, SVG-OM, SVG-distal RCA).  Endoscopic vein harvesting from right thigh.  He was started on Lopressor, ASA, statin.  Hemoglobin A1c was 6.2.  He was deemed to be likely prediabetic.  His last H&H were 9.5 and 30 respectively.  He was volume overloaded and was diuresed.  Chest x-ray showed moderate left-sided pleural effusion.  He underwent thoracentesis on 06/06/2020 with 700 mL of fluid  removed.  He presented to [facility] ED on 06/23/2020.  He had an aspirational event on 06/18/2020 and 06/20/2020.  He began developing shortness of breath on 06/21/2020 and it progressed over the following 1 to 2 days.  He had a chest CT on 06/23/2020 which was negative for PE but had right lower lobe and left lower lobe patchy groundglass opacity and left upper lobe nodule.  He was admitted and treated with Unasyn for aspiration pneumonia.  He was discharged home on Augmentin x6 more days.  He was continuing PPI for GERD.  He was continuing aspirin and Plavix along with statin, metoprolol, telmisartan.  Hemoglobin A1c was 6.2%.  LDL was 99.  His hypokalemia was repleted.  Magnesium was 2.2 at discharge.  He was to continue outpatient surveillance for left upper lobe lung nodule.  He is here for follow-up status post three-vessel CABG and subsequent hospital stay for aspiration pneumonia.  He states he has been doing reasonably well from a cardiac standpoint.  His blood pressure is on the low side today at 90/68.  He is asymptomatic.  He states his blood pressures at home have been better.  He is currently not taking telmisartan.  He recently had his metoprolol dosage decreased from 100 mg p.o. twice daily down to 50 mg p.o. twice daily.  He is continuing Plavix and aspirin DAPT therapy without bleeding issues.  He denies any problems with median sternotomy or chest tube insertion sites.  States right SVG graft harvesting site looks good.  Denies any increase in shortness of breath.  States he has been walking some inside his house without  any issues.  Denies any weight gain.  In fact states he has lost some weight since the surgery.  He states he has been called about starting cardiac rehab which will apparently start in August per his statement.  He plans to start cardiac rehab at that time.  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   LIMA to LAD, SVG to OM, SVG to RCA    Past Surgical History:  Procedure Laterality Date   CORONARY ARTERY BYPASS GRAFT N/A 06/02/2020   Procedure: CORONARY ARTERY BYPASS GRAFTING (CABG) TIMES THREE USING RIGHT GREATER SAPHENOUS VEIN HARVESTED ENDOSCOPICALLY AND LEFT INTERNAL MAMMARY ARTERY.;  Surgeon: James Alberts, MD;  Location: [facility];  Service: Open Heart Surgery;  Laterality: N/A;   CYSTOSCOPY/RETROGRADE/URETEROSCOPY/STONE EXTRACTION WITH BASKET     ESOPHAGECTOMY     Ivor-Lewis esophagectomy 1995 [facility]   IR THORACENTESIS ASP PLEURAL SPACE W/IMG GUIDE  06/06/2020   LEFT HEART CATH AND CORONARY ANGIOGRAPHY N/A 05/30/2020   Procedure: LEFT HEART CATH AND CORONARY ANGIOGRAPHY;  Surgeon: Martinique, Peter M, MD;  Location: [facility];  Service: Cardiovascular;  Laterality: N/A;   partial esophageal ejectory     SHOULDER OPEN ROTATOR CUFF REPAIR Left 10/26/2019   Procedure: ROTATOR CUFF REPAIR SHOULDER OPEN LEFT;  Surgeon: [name], MD;  Location: [facility];  Service: Orthopedics;  Laterality: Left;    Current Outpatient Medications  Medication Sig Dispense Refill   albuterol (VENTOLIN HFA) 108 (90 Base) MCG/ACT inhaler Inhale 1-2 puffs into the lungs every 6 (six) hours as needed for wheezing or shortness of breath.     aspirin EC 81 MG EC tablet Take 1 tablet (81 mg total) by mouth daily. Swallow whole. 30 tablet 11   B Complex Vitamins (VITAMIN B COMPLEX PO) Take 1 tablet by mouth daily.      cholecalciferol (VITAMIN D3) 25 MCG (1000 UNIT) tablet Take 1,000 Units by mouth daily.     clopidogrel (PLAVIX) 75 MG tablet Take 1 tablet (75 mg total) by mouth daily. 30 tablet 11   diclofenac sodium  (VOLTAREN) 1 % GEL Apply 2 g topically 2 (two) times daily as needed (pain).      esomeprazole (NEXIUM) 40 MG capsule Take 40 mg by mouth 2 (two) times daily.      metoprolol tartrate (LOPRESSOR) 100 MG tablet Take 1 tablet (100 mg total) by mouth 2 (two) times daily. (Patient taking differently: Take 50 mg by mouth 2 (two) times daily.) 60 tablet 3   rosuvastatin (CRESTOR) 40 MG tablet Take 1 tablet (40 mg total) by mouth daily at 6 PM. 30 tablet 3   No current facility-administered medications for this visit.   Allergies:  Zithromax [azithromycin]   Social History: The patient  reports that he quit smoking about 27 years ago. His smoking use included cigarettes. He has a 25.00 pack-year smoking history. He has never used smokeless tobacco. He reports previous alcohol use. He reports that he does not use drugs.   Family History: The  patient's family history includes CAD in his father; CVA in his father.   ROS:  Please see the history of present illness. Otherwise, complete review of systems is positive for none.  All other systems are reviewed and negative.   Physical Exam: VS:  BP 90/68   Pulse 64   Ht 5\' 5"  (1.651 m)   Wt 159 lb 12.8 oz (72.5 kg)   SpO2 98%   BMI 26.59 kg/m , BMI Body mass index is 26.59 kg/m.  Wt Readings from Last 3 Encounters:  07/04/20 159 lb 12.8 oz (72.5 kg)  06/24/20 154 lb 5.2 oz (70 kg)  06/08/20 167 lb (75.8 kg)    General: Patient appears comfortable at rest. Neck: Supple, no elevated JVP or carotid bruits, no thyromegaly. Lungs: Clear to auscultation, nonlabored breathing at rest. Cardiac: Regular rate and rhythm, no S3 or significant systolic murmur, no pericardial rub. Extremities: No pitting edema, distal pulses 2+. Skin: Warm and dry.  Right radial access site is clean and dry with good pulse.  Median sternotomy site along with chest tube insertion sites well-healed, right saphenous vein graft harvesting site clean and dry. Musculoskeletal: No  kyphosis. Neuropsychiatric: Alert and oriented x3, affect grossly appropriate.  ECG:  EKG 06/23/2020 sinus tachycardia rate of 111, cannot rule out inferior infarct, age undetermined.  Recent Labwork: 06/24/2020: ALT 46; AST 43 06/25/2020: BUN 13; Creatinine, Ser 1.20; Hemoglobin 9.2; Magnesium 1.8; Platelets 307; Potassium 3.9; Sodium 140     Component Value Date/Time   CHOL 167 05/30/2020 0948   TRIG 72 05/30/2020 0948   HDL 54 05/30/2020 0948   CHOLHDL 3.1 05/30/2020 0948   VLDL 14 05/30/2020 0948   LDLCALC 99 05/30/2020 0948    Other Studies Reviewed Today:  Coronary Artery Bypass Grafting x 3  06/02/2020 Left Internal Mammary Artery to Distal Left Anterior Descending Coronary Artery, Saphenous Vein Graft to Distal Right Coronary Artery, Saphenous Vein Graft to Obtuse Marginal Branch of Left Circumflex Coronary Artery, Endoscopic Vein Harvest from Right Thigh and Lower Leg  Surgeon: Valentina Gu. Roxy Manns, MD  Echocardiogram 05/31/2020  1. Left ventricular ejection fraction, by estimation, is 60 to 65%. The left ventricle has normal function. The left ventricle has no regional wall motion abnormalities. Left ventricular diastolic parameters were normal. 2. Right ventricular systolic function is normal. The right ventricular size is normal. There is normal pulmonary artery systolic pressure. 3. The mitral valve is normal in structure. No evidence of mitral valve regurgitation. No evidence of mitral stenosis. 4. The aortic valve is tricuspid. Aortic valve regurgitation is not visualized. Mild aortic valve sclerosis is present, with no evidence of aortic valve stenosis. 5. The inferior vena cava is normal in size with greater than 50% respiratory variability, suggesting right atrial pressure of 3 mmHg.  Cardiac catheterization 05/30/2020 Conclusion    Ost LM to Dist LM lesion is 90% stenosed. Mid LAD lesion is 90% stenosed. Prox Cx lesion is 50% stenosed. 1st Mrg lesion is 70%  stenosed. Ost RCA to Prox RCA lesion is 50% stenosed. Mid RCA lesion is 70% stenosed. Dist RCA lesion is 70% stenosed. LV end diastolic pressure is normal.   1. 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 Normal LVEDP   Plan: Echo to assess LV. Recommend CT surgery consultation for CABG.  Diagnostic Dominance: Right   Carotid artery duplex 05/31/2020 Right Carotid: Velocities in the right ICA are consistent with a 60-79% stenosis. Nonhemodynamically significant plaque <50% noted in the CCA. Left  Carotid: Velocities in the left ICA are consistent with a 1-39% stenosis. Nonhemodynamically significant plaque <50% noted in the CCA. Vertebrals: Bilateral vertebral arteries demonstrate antegrade flow. Subclavians: Normal flow hemodynamics were seen in bilateral subclavian arteries.  ABI with upper extremity arterial dopplers 05/31/2020 Right ABI: Resting right ankle-brachial index indicates mild right lower extremity arterial disease. The right toe-brachial index is abnormal. Left ABI: Resting left ankle-brachial index is within normal range. No evidence of significant left lower extremity arterial disease. The left toe-brachial index is abnormal. Right Upper Extremity: Doppler waveforms decrease >50% with right radial compression. Doppler waveforms remain within normal limits with right ulnar compression. Left Upper Extremity: Doppler waveforms remain within normal limits with left radial compression. Doppler waveforms remain within normal limits with left ulnar compression.  Assessment and Plan:  1. Non-ST elevation (NSTEMI) myocardial infarction ([place])   2. Coronary artery disease involving native coronary artery of native heart with unstable angina pectoris ([place])   3. S/P CABG x 3   4. Hyperlipidemia with target LDL less than 70   5. Essential hypertension   6. Bilateral carotid artery stenosis   7. Pre-diabetes    1. Non-ST elevation (NSTEMI) myocardial infarction  [facility]) Presentation to [facility] with chest pain 05/29/2020 secondary to several days of chest pain after presenting to hospital in [place] preceding day for chest pain with elevated troponins.  He left AMA from [facility] and presented to [facility] secondary to continued chest pain.  Currently denies any anginal or exertional symptoms.  Continue aspirin 81 mg daily.  Continue Plavix 75 mg daily.  Continue metoprolol 50 mg p.o. twice daily.  2. Coronary artery disease involving native coronary artery of native heart with unstable angina pectoris [facility]) Cardiac catheterization 05/30/2020: Ostial LM to distal LM 90%, mid LAD 90%, proximal circumflex 50%, first marginal 70%, ostial RCA to proximal RCA 50%, mid RCA 70% distal RCA 70%  3. S/P CABG x 3 Left Internal Mammary Artery to Distal Left Anterior Descending Coronary Artery, Saphenous Vein Graft to Distal Right Coronary Artery, Saphenous Vein Graft to Obtuse Marginal Branch of Left Circumflex Coronary Artery, Endoscopic Vein Harvest from Right Thigh and Lower Leg .  Valentina Gu. Roxy Manns, MD.  Median sternotomy scar, chest tube insertion site and right thigh saphenous vein graft harvesting site well-healed without redness, swelling, or infection.  4. Hyperlipidemia with target LDL less than 70 Continue Crestor 40 mg p.o. daily.  Recent lipid level on 05/30/2020 demonstrated TC of 167, TG 72, HDL 54, LDL 99.  May need to add Zetia 10 mg daily to current regimen to reach LDL goal of less than 70.  5. Essential hypertension Blood pressure low on arrival today.  His Micardis was recently stopped and his metoprolol was decreased to 50 mg p.o. twice daily.  Do not restart Micardis.  Advised he and his wife to check his blood pressures over time and bring a log with them to the next follow-up visit to get an idea of what his blood pressure trend has been.  He states his blood pressures are usually much better at home ranging systolic from  the 510C to 130.  6. Bilateral carotid artery stenosis Recent carotid artery duplex study in preparation for bypass surgery demonstrated RICA of 60 to 79% stenosis.  LICA 1 to 58% stenosis.  Will need repeat carotid study in 6 months  7. Pre-diabetes Last hemoglobin A1c was 6.2%.  Advised him to follow-up with his PCP secondary to possible  prediabetes.  Currently not on any antihyperglycemic medications.  Medication Adjustments/Labs and Tests Ordered: Current medicines are reviewed at length with the patient today.  Concerns regarding medicines are outlined above.   Disposition: Follow-up with Dr. Harl Bowie.  Patient wants to establish with Dr. Harl Bowie as his primary cardiologist  Signed, Levell July, NP 07/04/2020 1:45 PM    [facility] [place], [place], [place] [address] Phone: [phone]; Fax: [phone]

## 2020-07-04 ENCOUNTER — Ambulatory Visit (INDEPENDENT_AMBULATORY_CARE_PROVIDER_SITE_OTHER): Payer: Self-pay | Admitting: Family Medicine

## 2020-07-04 ENCOUNTER — Encounter: Payer: Self-pay | Admitting: Family Medicine

## 2020-07-04 ENCOUNTER — Telehealth: Payer: Self-pay | Admitting: *Deleted

## 2020-07-04 VITALS — BP 90/68 | HR 64 | Ht 65.0 in | Wt 159.8 lb

## 2020-07-04 DIAGNOSIS — I10 Essential (primary) hypertension: Secondary | ICD-10-CM

## 2020-07-04 DIAGNOSIS — I214 Non-ST elevation (NSTEMI) myocardial infarction: Secondary | ICD-10-CM

## 2020-07-04 DIAGNOSIS — Z951 Presence of aortocoronary bypass graft: Secondary | ICD-10-CM

## 2020-07-04 DIAGNOSIS — E785 Hyperlipidemia, unspecified: Secondary | ICD-10-CM

## 2020-07-04 DIAGNOSIS — I25110 Atherosclerotic heart disease of native coronary artery with unstable angina pectoris: Secondary | ICD-10-CM

## 2020-07-04 DIAGNOSIS — I6523 Occlusion and stenosis of bilateral carotid arteries: Secondary | ICD-10-CM

## 2020-07-04 DIAGNOSIS — R7303 Prediabetes: Secondary | ICD-10-CM

## 2020-07-04 MED ORDER — EZETIMIBE 10 MG PO TABS: 10.0000 mg | ORAL_TABLET | Freq: Every day | ORAL | 1 refills | Status: DC

## 2020-07-04 NOTE — Telephone Encounter (Signed)
Per Jonni Sanger, need to start zetia 10 mg daily to get LDL at goal.  Need carotid in 6 months which can be ordered at f/u visit

## 2020-07-04 NOTE — Patient Instructions (Addendum)
Medication Instructions:   Your physician recommends that you continue on your current medications as directed. Please refer to the Current Medication list given to you today.  Labwork:  none  Testing/Procedures:  none  Follow-Up:  Your physician recommends that you schedule a follow-up appointment in: 3 months.  Any Other Special Instructions Will Be Listed Below (If Applicable).  If you need a refill on your cardiac medications before your next appointment, please call your pharmacy. 

## 2020-07-04 NOTE — Telephone Encounter (Signed)
Patient informed and verbalized understanding of plan. 

## 2020-07-11 ENCOUNTER — Encounter: Payer: Self-pay | Admitting: Physician Assistant

## 2020-07-11 ENCOUNTER — Ambulatory Visit
Admission: RE | Admit: 2020-07-11 | Discharge: 2020-07-11 | Disposition: A | Discharge status: Home / Self Care | Payer: Medicare Other | Source: Ambulatory Visit | Attending: Thoracic Surgery (Cardiothoracic Vascular Surgery) | Admitting: Thoracic Surgery (Cardiothoracic Vascular Surgery)

## 2020-07-11 ENCOUNTER — Ambulatory Visit (INDEPENDENT_AMBULATORY_CARE_PROVIDER_SITE_OTHER): Payer: Self-pay | Admitting: Physician Assistant

## 2020-07-11 ENCOUNTER — Other Ambulatory Visit: Payer: Self-pay

## 2020-07-11 VITALS — BP 106/66 | HR 80 | Resp 20 | Ht 65.0 in | Wt 159.0 lb

## 2020-07-11 DIAGNOSIS — Z951 Presence of aortocoronary bypass graft: Secondary | ICD-10-CM

## 2020-07-11 IMAGING — CR DG CHEST 2V
2 series · 2 of 2 positions shown · non-contrast
Comparison: Radiograph [DATE], chest CT [DATE]

CLINICAL DATA: Status post CABG x3 in [DATE]

EXAM:
CHEST - 2 VIEW

[w chest pa]
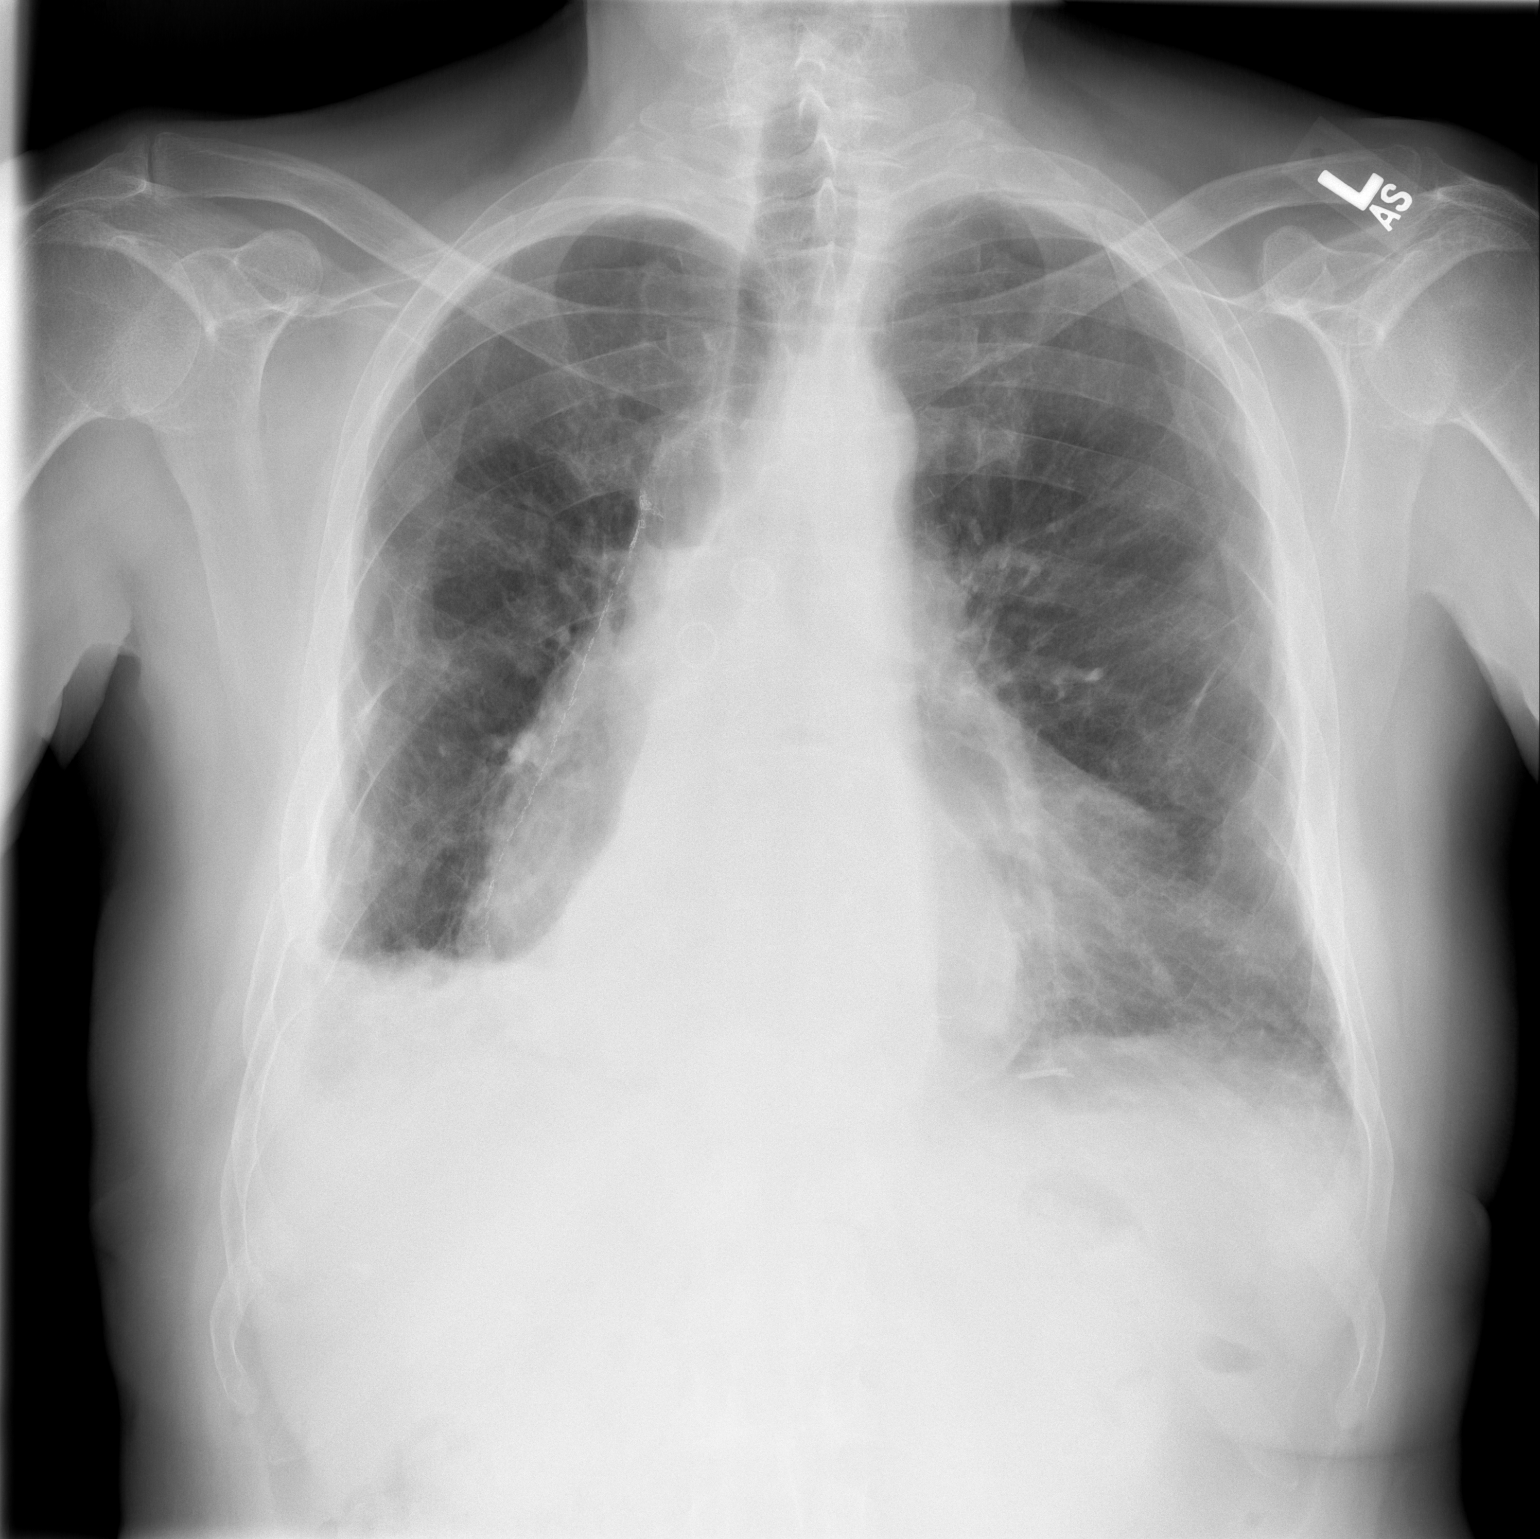

[w chest lat]
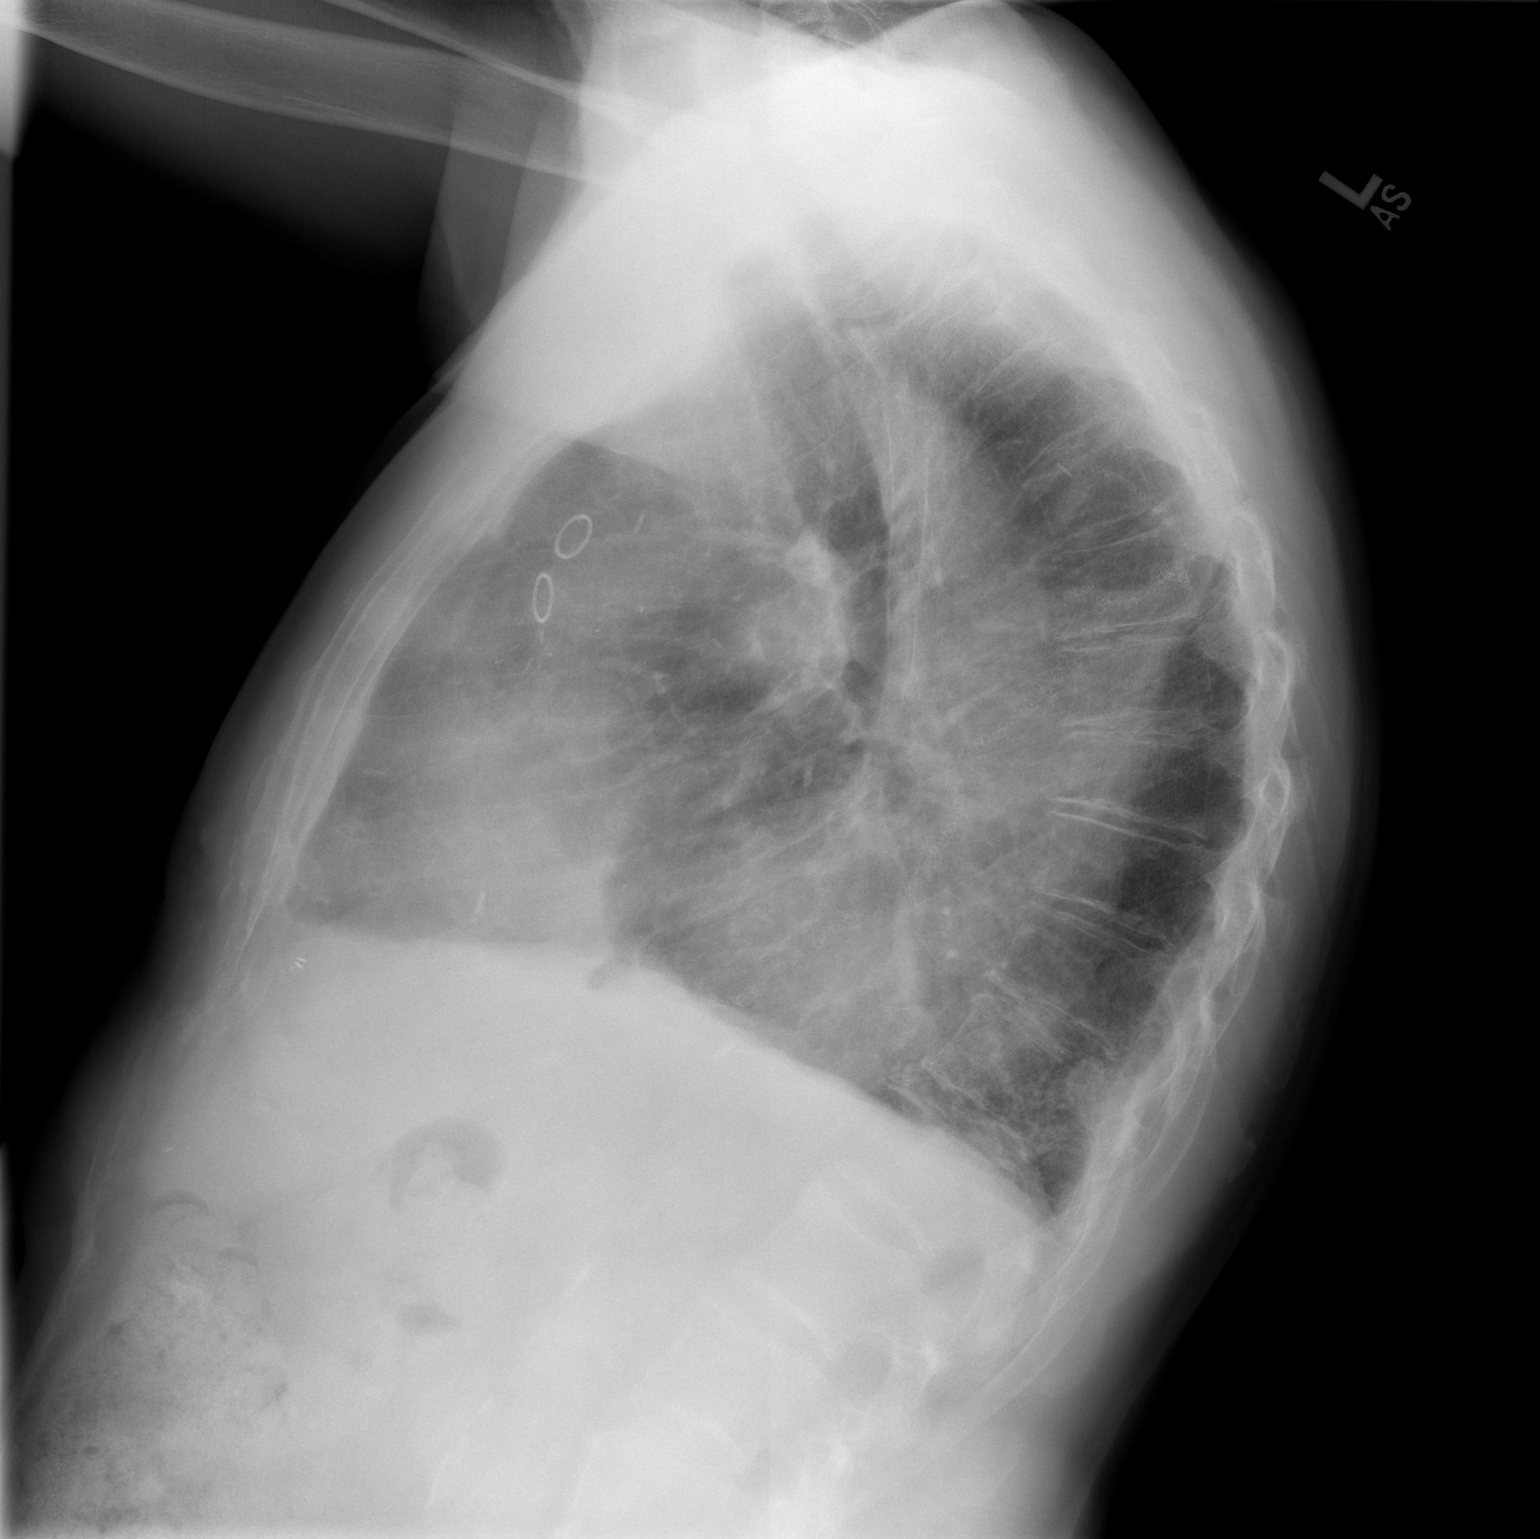

[2 of 2 positions shown; findings below may reference images not displayed]

FINDINGS: Unchanged cardiomediastinal silhouette with postsurgical changes of
CABG and esophagectomy with gastric pull-through. Persistent
bibasilar pleural effusions/pleural thickening with atelectasis
and/or scarring. No visible pneumothorax. No acute osseous
abnormality. Chronic bilateral rib deformities.
IMPRESSION: Unchanged cardiomediastinal silhouette with postsurgical changes of
CABG and esophagectomy with gastric pull-through.

Persistent bilateral pleural effusions/bibasilar pleural thickening
with atelectasis and or scarring, not significantly changed from
prior exams.

## 2020-07-11 NOTE — Patient Instructions (Signed)
No change in medications May resume driving Continue to observe sternal precautions for another month. Follow-up as needed

## 2020-07-11 NOTE — Progress Notes (Signed)
HPI:  Patient returns for routine postoperative follow-up having undergone CABG x3 by Dr. 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  He had an uneventful postoperative course.  He did develop a left-sided pleural effusion and underwent left thoracentesis yielding 700 mL of fluid a few days prior to his discharge home.  He was discharged home on postop day 6 on aspirin, Plavix, metoprolol, and rosuvastatin.  His telmisartan was also resumed for his history of hypertension.  Since hospital discharge the patient reports he was readmitted to [facility] few weeks ago for suspected aspiration pneumonia.  He admits this is not uncommon for him given his history of Ivor-Lewis esophagectomy in 1995.  He was only in the hospital for few days and finished course of antibiotics at home.  Since then, he has continued to progress.  He denies any cough or shortness of breath.  He denies any sternal pain.    Current Outpatient Medications  Medication Sig Dispense Refill   albuterol (VENTOLIN HFA) 108 (90 Base) MCG/ACT inhaler Inhale 1-2 puffs into the lungs every 6 (six) hours as needed for wheezing or shortness of breath.     aspirin EC 81 MG EC tablet Take 1 tablet (81 mg total) by mouth daily. Swallow whole. 30 tablet 11   B Complex Vitamins (VITAMIN B COMPLEX PO) Take 1 tablet by mouth daily.      cholecalciferol (VITAMIN D3) 25 MCG (1000 UNIT) tablet Take 1,000 Units by mouth daily.     clopidogrel (PLAVIX) 75 MG tablet Take 1 tablet (75 mg total) by mouth daily. 30 tablet 11   diclofenac sodium (VOLTAREN) 1 % GEL Apply 2 g topically 2 (two) times daily as needed (pain).      esomeprazole (NEXIUM) 40 MG capsule Take 40 mg by mouth 2 (two) times daily.      ezetimibe (ZETIA) 10 MG tablet Take 1 tablet (10 mg total) by mouth daily. 90 tablet 1   metoprolol tartrate (LOPRESSOR) 100 MG tablet Take 1 tablet (100 mg total) by mouth 2 (two)  times daily. (Patient taking differently: Take 50 mg by mouth 2 (two) times daily.) 60 tablet 3   rosuvastatin (CRESTOR) 40 MG tablet Take 1 tablet (40 mg total) by mouth daily at 6 PM. 30 tablet 3   No current facility-administered medications for this visit.    Physical Exam Vital signs BP 106/66 Pulse 80 Resp 20 SPO2 97%  Heart: Regular rate and rhythm Chest: Breath sounds are full and equal and clear to auscultation.  Chest x-ray is reviewed.  There continues to be some stable volume loss on the right side that was present prior to his surgery.  Lung fields are otherwise clear. Extremities: No edema. Wounds: Both the sternotomy incision in the right lower extremity EVH incision are intact and healing with no sign of complication.  Sternum is stable.  Diagnostic Tests:  EXAM: CHEST - 2 VIEW   COMPARISON:  Radiograph 06/23/2020, chest CT 06/23/2020   FINDINGS: Unchanged cardiomediastinal silhouette with postsurgical changes of CABG and esophagectomy with gastric pull-through. Persistent bibasilar pleural effusions/pleural thickening with atelectasis and/or scarring. No visible pneumothorax. No acute osseous abnormality. Chronic bilateral rib deformities.   IMPRESSION: Unchanged cardiomediastinal silhouette with postsurgical changes of CABG and esophagectomy with gastric pull-through.   Persistent bilateral pleural effusions/bibasilar pleural thickening with atelectasis and or scarring, not significantly changed from prior exams.     Electronically Signed   By: Edison Nasuti  Chancy Milroy   On: 07/11/2020 15:18   Impression / Plan:  [name] is making satisfactory progress 5-weeks status post three-vessel coronary CABG for multivessel and left main coronary artery stenoses.  His medications have been adjusted since his discharge: He is now taking metoprolol 50 mg p.o. twice daily and the Micardis has been discontinued due to hypotension.  No changes to his medications were made  today.  Sternal precautions were reviewed.  He may resume driving at this stage and he is encouraged to continue to advance his activity.  I recommended that he maintain close follow-up with his primary care physician for his borderline diabetes mellitus.  His hemoglobin A1c was 6.2 at the time of admission.  We recommend continuing the Plavix for 1 year from the time of surgery since he presented with acute coronary syndrome.  I have not scheduled any further follow-up with our service but offered to see them anytime should any problem arise related to his heart surgery.   Antony Odea, PA-C [facility] [phone]

## 2020-08-23 ENCOUNTER — Other Ambulatory Visit: Payer: Self-pay

## 2020-08-23 ENCOUNTER — Encounter (HOSPITAL_COMMUNITY): Payer: Self-pay

## 2020-08-23 ENCOUNTER — Encounter (HOSPITAL_COMMUNITY)
Admission: RE | Admit: 2020-08-23 | Discharge: 2020-08-23 | Disposition: A | Discharge status: Home / Self Care | Payer: Medicare Other | Source: Ambulatory Visit | Attending: Thoracic Surgery (Cardiothoracic Vascular Surgery) | Admitting: Thoracic Surgery (Cardiothoracic Vascular Surgery)

## 2020-08-23 VITALS — BP 110/60 | HR 67 | Ht 65.0 in | Wt 163.4 lb

## 2020-08-23 DIAGNOSIS — Z951 Presence of aortocoronary bypass graft: Secondary | ICD-10-CM | POA: Diagnosis present

## 2020-08-23 DIAGNOSIS — I214 Non-ST elevation (NSTEMI) myocardial infarction: Secondary | ICD-10-CM | POA: Insufficient documentation

## 2020-08-23 NOTE — Progress Notes (Signed)
Cardiac Individual Treatment Plan  Patient Details  Name: James Wolf MRN: [id number] Date of Birth: Jan 31, 1950 Referring Provider:   Flowsheet Row CARDIAC REHAB PHASE II ORIENTATION from 08/23/2020 in [place]  Referring Provider Dr. Roxy Manns       Initial Encounter Date:  Flowsheet Row CARDIAC REHAB PHASE II ORIENTATION from 08/23/2020 in [place]  Date 08/23/20       Visit Diagnosis: S/P CABG x 3  NSTEMI (non-ST elevated myocardial infarction) ([place])  Patient's Home Medications on Admission:  Current Outpatient Medications:    albuterol (VENTOLIN HFA) 108 (90 Base) MCG/ACT inhaler, Inhale 1-2 puffs into the lungs every 6 (six) hours as needed for wheezing or shortness of breath., Disp: , Rfl:    aspirin EC 81 MG EC tablet, Take 1 tablet (81 mg total) by mouth daily. Swallow whole., Disp: 30 tablet, Rfl: 11   B Complex Vitamins (VITAMIN B COMPLEX PO), Take 1 tablet by mouth daily. , Disp: , Rfl:    cholecalciferol (VITAMIN D3) 25 MCG (1000 UNIT) tablet, Take 1,000 Units by mouth daily., Disp: , Rfl:    clopidogrel (PLAVIX) 75 MG tablet, Take 1 tablet (75 mg total) by mouth daily., Disp: 30 tablet, Rfl: 11   diclofenac sodium (VOLTAREN) 1 % GEL, Apply 2 g topically 2 (two) times daily as needed (pain). , Disp: , Rfl:    esomeprazole (NEXIUM) 40 MG capsule, Take 40 mg by mouth 2 (two) times daily. , Disp: , Rfl:    ezetimibe (ZETIA) 10 MG tablet, Take 1 tablet (10 mg total) by mouth daily., Disp: 90 tablet, Rfl: 1   metoprolol tartrate (LOPRESSOR) 100 MG tablet, Take 1 tablet (100 mg total) by mouth 2 (two) times daily. (Patient taking differently: Take 50 mg by mouth 2 (two) times daily.), Disp: 60 tablet, Rfl: 3   rosuvastatin (CRESTOR) 40 MG tablet, Take 1 tablet (40 mg total) by mouth daily at 6 PM., Disp: 30 tablet, Rfl: 3  Past Medical History: Past Medical History:  Diagnosis Date   Anemia    Aspiration pneumonia ([place])     Cancer ([place])    esophageal cancer 1995   Chest pain    COPD (chronic obstructive pulmonary disease) ([place])    GERD (gastroesophageal reflux disease)    History of kidney stones    Hyperlipidemia    Hypertension    Joint pain    Osteomyelitis ([facility])    Osteoporosis    Rheumatic fever    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   LIMA to LAD, SVG to OM, SVG to RCA    Tobacco Use: Social History   Tobacco Use  Smoking Status Former   Packs/day: 1.00   Years: 25.00   Pack years: 25.00   Types: Cigarettes   Quit date: 02/19/1993   Years since quitting: 27.5  Smokeless Tobacco Never    Labs: Recent Review Flowsheet Data     Labs for ITP Cardiac and Pulmonary Rehab Latest Ref Rng & Units 06/02/2020 06/02/2020 06/02/2020 06/02/2020 06/02/2020   Cholestrol 0 - 200 mg/dL - - - - -   LDLCALC 0 - 99 mg/dL - - - - -   HDL >40 mg/dL - - - - -   Trlycerides <150 mg/dL - - - - -   Hemoglobin A1c 4.8 - 5.6 % - - - - -   PHART 7.350 - 7.450 - 7.425 7.342(L) 7.358 7.342(L)   PCO2ART 32.0 - 48.0 mmHg - 39.1 42.3  43.1 44.4   HCO3 20.0 - 28.0 mmol/L - 25.7 23.3 24.3 24.0   TCO2 22 - 32 mmol/L '27 27 25 26 25   '$ ACIDBASEDEF 0.0 - 2.0 mmol/L - - 3.0(H) 1.0 2.0   O2SAT % - 100.0 96.0 96.0 99.0       Capillary Blood Glucose: Lab Results  Component Value Date   GLUCAP 167 (H) 06/08/2020   GLUCAP 123 (H) 06/05/2020   GLUCAP 130 (H) 06/04/2020   GLUCAP 113 (H) 06/04/2020   GLUCAP 116 (H) 06/04/2020     Exercise Target Goals: Exercise Program Goal: Individual exercise prescription set using results from initial 6 min walk test and THRR while considering  patient's activity barriers and safety.   Exercise Prescription Goal: Starting with aerobic activity 30 plus minutes a day, 3 days per week for initial exercise prescription. Provide home exercise prescription and guidelines that participant acknowledges understanding prior to discharge.  Activity Barriers & Risk Stratification:  Activity Barriers &  Cardiac Risk Stratification - 08/23/20 1307       Activity Barriers & Cardiac Risk Stratification   Activity Barriers Back Problems;Joint Problems;Shortness of Breath    Cardiac Risk Stratification High             6 Minute Walk:  6 Minute Walk     Row Name 08/23/20 1428         6 Minute Walk   Distance 1150 feet     Walk Time 6 minutes     # of Rest Breaks 0     MPH 2.18     METS 2.31     RPE 11     VO2 Peak 8.09     Symptoms No              Oxygen Initial Assessment:   Oxygen Re-Evaluation:   Oxygen Discharge (Final Oxygen Re-Evaluation):   Initial Exercise Prescription:  Initial Exercise Prescription - 08/23/20 1400       Date of Initial Exercise RX and Referring Provider   Date 08/23/20    Referring Provider Dr. Roxy Manns    Expected Discharge Date 11/17/20      Treadmill   MPH 1.8    Grade 0    Minutes 17      NuStep   Level 1    SPM 80    Minutes 22      Prescription Details   Frequency (times per week) 3    Duration Progress to 30 minutes of continuous aerobic without signs/symptoms of physical distress      Intensity   THRR 40-80% of Max Heartrate 60-121    Ratings of Perceived Exertion 11-13    Perceived Dyspnea 0-4      Resistance Training   Training Prescription Yes    Weight 4 lbs    Reps 10-15             Perform Capillary Blood Glucose checks as needed.  Exercise Prescription Changes:   Exercise Comments:   Exercise Goals and Review:   Exercise Goals     Row Name 08/23/20 1429             Exercise Goals   Increase Physical Activity Yes       Intervention Provide advice, education, support and counseling about physical activity/exercise needs.;Develop an individualized exercise prescription for aerobic and resistive training based on initial evaluation findings, risk stratification, comorbidities and participant's personal goals.       Expected Outcomes Short Term:  Attend rehab on a regular basis to  increase amount of physical activity.;Long Term: Add in home exercise to make exercise part of routine and to increase amount of physical activity.;Long Term: Exercising regularly at least 3-5 days a week.       Increase Strength and Stamina Yes       Intervention Provide advice, education, support and counseling about physical activity/exercise needs.;Develop an individualized exercise prescription for aerobic and resistive training based on initial evaluation findings, risk stratification, comorbidities and participant's personal goals.       Expected Outcomes Short Term: Increase workloads from initial exercise prescription for resistance, speed, and METs.;Short Term: Perform resistance training exercises routinely during rehab and add in resistance training at home;Long Term: Improve cardiorespiratory fitness, muscular endurance and strength as measured by increased METs and functional capacity (6MWT)       Able to understand and use rate of perceived exertion (RPE) scale Yes       Intervention Provide education and explanation on how to use RPE scale       Expected Outcomes Short Term: Able to use RPE daily in rehab to express subjective intensity level;Long Term:  Able to use RPE to guide intensity level when exercising independently       Knowledge and understanding of Target Heart Rate Range (THRR) Yes       Intervention Provide education and explanation of THRR including how the numbers were predicted and where they are located for reference       Expected Outcomes Short Term: Able to state/look up THRR;Long Term: Able to use THRR to govern intensity when exercising independently;Short Term: Able to use daily as guideline for intensity in rehab       Able to check pulse independently Yes       Intervention Provide education and demonstration on how to check pulse in carotid and radial arteries.;Review the importance of being able to check your own pulse for safety during independent exercise        Expected Outcomes Short Term: Able to explain why pulse checking is important during independent exercise;Long Term: Able to check pulse independently and accurately       Understanding of Exercise Prescription Yes       Intervention Provide education, explanation, and written materials on patient's individual exercise prescription       Expected Outcomes Short Term: Able to explain program exercise prescription;Long Term: Able to explain home exercise prescription to exercise independently                Exercise Goals Re-Evaluation :    Discharge Exercise Prescription (Final Exercise Prescription Changes):   Nutrition:  Target Goals: Understanding of nutrition guidelines, daily intake of sodium '1500mg'$ , cholesterol '200mg'$ , calories 30% from fat and 7% or less from saturated fats, daily to have 5 or more servings of fruits and vegetables.  Biometrics:  Pre Biometrics - 08/23/20 1430       Pre Biometrics   Height '5\' 5"'$  (1.651 m)    Weight 74.1 kg    Waist Circumference 40 inches    Hip Circumference 40 inches    Waist to Hip Ratio 1 %    BMI (Calculated) 27.18    Triceps Skinfold 15 mm    % Body Fat 27.8 %    Grip Strength 31.6 kg    Flexibility 16 in    Single Leg Stand 14.22 seconds              Nutrition  Therapy Plan and Nutrition Goals:  Nutrition Therapy & Goals - 08/23/20 1314       Personal Nutrition Goals   Comments Patietn scored 42 on his diet assessment. Handout provided and discussed with he and his wife regarding healthier choices. We offer 2 educational sessions on heart healthy nutrition with handouts and RD referral if patient is interested.      Intervention Plan   Intervention Nutrition handout(s) given to patient.             Nutrition Assessments:  Nutrition Assessments - 08/23/20 1314       MEDFICTS Scores   Pre Score 42            MEDIFICTS Score Key: ?70 Need to make dietary changes  40-70 Heart Healthy Diet ? 40  Therapeutic Level Cholesterol Diet   Picture Your Plate Scores: [id number] Unhealthy dietary pattern with much room for improvement. 41-50 Dietary pattern unlikely to meet recommendations for good health and room for improvement. 51-60 More healthful dietary pattern, with some room for improvement.  >60 Healthy dietary pattern, although there may be some specific behaviors that could be improved.    Nutrition Goals Re-Evaluation:   Nutrition Goals Discharge (Final Nutrition Goals Re-Evaluation):   Psychosocial: Target Goals: Acknowledge presence or absence of significant depression and/or stress, maximize coping skills, provide positive support system. Participant is able to verbalize types and ability to use techniques and skills needed for reducing stress and depression.  Initial Review & Psychosocial Screening:  Initial Psych Review & Screening - 08/23/20 1400       Initial Review   Current issues with Current Sleep Concerns      Family Dynamics   Good Support System? Yes      Barriers   Psychosocial barriers to participate in program Psychosocial barriers identified (see note)      Screening Interventions   Interventions Encouraged to exercise;To provide support and resources with identified psychosocial needs;Provide feedback about the scores to participant    Expected Outcomes Short Term goal: Identification and review with participant of any Quality of Life or Depression concerns found by scoring the questionnaire.             Quality of Life Scores:  Quality of Life - 08/23/20 1427       Quality of Life   Select Quality of Life      Quality of Life Scores   Health/Function Pre 23.9 %    Socioeconomic Pre 26.57 %    Psych/Spiritual Pre 24.86 %    Family Pre 28.8 %    GLOBAL Pre 25.37 %            Scores of 19 and below usually indicate a poorer quality of life in these areas.  A difference of  2-3 points is a clinically meaningful difference.  A difference  of 2-3 points in the total score of the Quality of Life Index has been associated with significant improvement in overall quality of life, self-image, physical symptoms, and general health in studies assessing change in quality of life.  PHQ-9: Recent Review Flowsheet Data     Depression screen [facility] 2/9 08/23/2020   Decreased Interest 0   Down, Depressed, Hopeless 0   PHQ - 2 Score 0   Altered sleeping 1   Tired, decreased energy 1   Change in appetite 0   Feeling bad or failure about yourself  0   Trouble concentrating 0   Moving slowly or fidgety/restless  0   Suicidal thoughts 0   PHQ-9 Score 2   Difficult doing work/chores Not difficult at all      Interpretation of Total Score  Total Score Depression Severity:  1-4 = Minimal depression, 5-9 = Mild depression, 10-14 = Moderate depression, 15-19 = Moderately severe depression, 20-27 = Severe depression   Psychosocial Evaluation and Intervention:  Psychosocial Evaluation - 08/23/20 1401       Psychosocial Evaluation & Interventions   Interventions Stress management education;Relaxation education;Encouraged to exercise with the program and follow exercise prescription    Comments Patient has no psychosocial barriers identified to participate in CR. His initial PHQ-9 score was 2 and his QOL score was 25.37 overall. He live with his wife of many years. He has one daughter and 3 grandchildren who are all very supportative and involved in his life. 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 He denies any depression or anxiety or stress. He says he does have trouble falling asleep some nights but he does not take anything. He wants to improve his strength and stamina and SOB with activities and is looking forward to starting the program.    Expected Outcomes Patient will continue to have no psychosocial barriers identified.    Continue Psychosocial Services  No Follow up required             Psychosocial  Re-Evaluation:   Psychosocial Discharge (Final Psychosocial Re-Evaluation):   Vocational Rehabilitation: Provide vocational rehab assistance to qualifying candidates.   Vocational Rehab Evaluation & Intervention:  Vocational Rehab - 08/23/20 1315       Initial Vocational Rehab Evaluation & Intervention   Assessment shows need for Vocational Rehabilitation No      Vocational Rehab Re-Evaulation   Comments Remigio Eisenmenger is retired and does not need vocational rehab.             Education: Education Goals: Education classes will be provided on a weekly basis, covering required topics. Participant will state understanding/return demonstration of topics presented.  Learning Barriers/Preferences:  Learning Barriers/Preferences - 08/23/20 1358       Learning Barriers/Preferences   Learning Barriers None    Learning Preferences Skilled Demonstration             Education Topics: Hypertension, Hypertension Reduction -Define heart disease and high blood pressure. Discus how high blood pressure affects the body and ways to reduce high blood pressure.   Exercise and Your Heart -Discuss why it is important to exercise, the FITT principles of exercise, normal and abnormal responses to exercise, and how to exercise safely.   Angina -Discuss definition of angina, causes of angina, treatment of angina, and how to decrease risk of having angina.   Cardiac Medications -Review what the following cardiac medications are used for, how they affect the body, and side effects that may occur when taking the medications.  Medications include Aspirin, Beta blockers, calcium channel blockers, ACE Inhibitors, angiotensin receptor blockers, diuretics, digoxin, and antihyperlipidemics.   Congestive Heart Failure -Discuss the definition of CHF, how to live with CHF, the signs and symptoms of CHF, and how keep track of weight and sodium intake.   Heart Disease and Intimacy -Discus the effect  sexual activity has on the heart, how changes occur during intimacy as we age, and safety during sexual activity.   Smoking Cessation / COPD -Discuss different methods to quit smoking, the health benefits of quitting smoking, and the definition of COPD.   Nutrition I: Fats -Discuss the  types of cholesterol, what cholesterol does to the heart, and how cholesterol levels can be controlled.   Nutrition II: Labels -Discuss the different components of food labels and how to read food label   Heart Parts/Heart Disease and PAD -Discuss the anatomy of the heart, the pathway of blood circulation through the heart, and these are affected by heart disease.   Stress I: Signs and Symptoms -Discuss the causes of stress, how stress may lead to anxiety and depression, and ways to limit stress.   Stress II: Relaxation -Discuss different types of relaxation techniques to limit stress.   Warning Signs of Stroke / TIA -Discuss definition of a stroke, what the signs and symptoms are of a stroke, and how to identify when someone is having stroke.   Knowledge Questionnaire Score:  Knowledge Questionnaire Score - 08/23/20 1358       Knowledge Questionnaire Score   Pre Score 20/24             Core Components/Risk Factors/Patient Goals at Admission:  Personal Goals and Risk Factors at Admission - 08/23/20 1358       Core Components/Risk Factors/Patient Goals on Admission    Weight Management Weight Maintenance    Improve shortness of breath with ADL's Yes    Intervention Provide education, individualized exercise plan and daily activity instruction to help decrease symptoms of SOB with activities of daily living.    Expected Outcomes Short Term: Improve cardiorespiratory fitness to achieve a reduction of symptoms when performing ADLs;Long Term: Be able to perform more ADLs without symptoms or delay the onset of symptoms    Personal Goal Other Yes    Personal Goal Increase strength and  stamina. Improve SOB with activities.    Intervention Patient will attend CR 3 days/week and supplement with exercise 2 days/week at home.    Expected Outcomes Patient will meeting both program and personal goals.             Core Components/Risk Factors/Patient Goals Review:    Core Components/Risk Factors/Patient Goals at Discharge (Final Review):    ITP Comments:   Comments: Patient arrived for 1st visit/orientation/education at 1230. Patient was referred to CR by Darylene Price. due to S/P CABGx3 (Z95.1) and NSTEMI (I21.4). During orientation advised patient on arrival and appointment times what to wear, what to do before, during and after exercise. Reviewed attendance and class policy.  Pt is scheduled to return Cardiac Rehab on 08/28/20 at 1100. Pt was advised to come to class 15 minutes before class starts.  Discussed RPE/Dpysnea scales. Patient participated in warm up stretches. Patient was able to complete 6 minute walk test.  Telemetry:NSR. Patient was measured for the equipment. Discussed equipment safety with patient. Took patient pre-anthropometric measurements. Patient finished visit at 1430.

## 2020-08-23 NOTE — Progress Notes (Signed)
Cardiac/Pulmonary Rehab Medication Review by a Pharmacist  Does the patient  feel that his/her medications are working for him/her?  yes  Has the patient been experiencing any side effects to the medications prescribed?  no  Does the patient measure his/her own blood pressure or blood glucose at home?  yes   Does the patient have any problems obtaining medications due to transportation or finances?   no  Understanding of regimen: excellent Understanding of indications: excellent Potential of compliance: excellent  Questions asked to Determine Patient Understanding of Medication Regimen:  1. What is the name of the medication?  2. What is the medication used for?  3. When should it be taken?  4. How much should be taken?  5. How will you take it?  6. What side effects should you report?  Understanding Defined as: Excellent: All questions above are correct Good: Questions 1-4 are correct Fair: Questions 1-2 are correct  Poor: 1 or none of the above questions are correct   Pharmacist comments: Patient and wife have great understanding of medications and regimen.  No further questions.    Ramond Craver 08/23/2020 1:49 PM

## 2020-08-26 ENCOUNTER — Other Ambulatory Visit: Payer: Self-pay | Admitting: Physician Assistant

## 2020-08-28 ENCOUNTER — Other Ambulatory Visit: Payer: Self-pay

## 2020-08-28 ENCOUNTER — Encounter (HOSPITAL_COMMUNITY)
Admission: RE | Admit: 2020-08-28 | Discharge: 2020-08-28 | Disposition: A | Discharge status: Home / Self Care | Payer: Medicare Other | Source: Ambulatory Visit | Attending: Cardiology | Admitting: Cardiology

## 2020-08-28 DIAGNOSIS — I214 Non-ST elevation (NSTEMI) myocardial infarction: Secondary | ICD-10-CM

## 2020-08-28 DIAGNOSIS — Z951 Presence of aortocoronary bypass graft: Secondary | ICD-10-CM | POA: Diagnosis not present

## 2020-08-28 NOTE — Progress Notes (Signed)
Daily Session Note  Patient Details  Name: James Wolf MRN: [id number] Date of Birth: July 29, 1950 Referring Provider:   Flowsheet Row CARDIAC REHAB PHASE II ORIENTATION from 08/23/2020 in [place]  Referring Provider Dr. Roxy Manns       Encounter Date: 08/28/2020  Check In:  Session Check In - 08/28/20 1100       Check-In   Supervising physician immediately available to respond to emergencies [facility] MD immediately available    Physician(s) Dr. Johnsie Cancel    Location [facility]    Staff Present Hoy Register, MS, ACSM-CEP, Exercise Physiologist;Other    Virtual Visit No    Medication changes reported     No    Fall or balance concerns reported    No    Tobacco Cessation No Change    Warm-up and Cool-down Performed as group-led instruction    Resistance Training Performed Yes    VAD Patient? No    PAD/SET Patient? No      Pain Assessment   Currently in Pain? No/denies    Pain Score 0-No pain    Multiple Pain Sites No             Capillary Blood Glucose: No results found for this or any previous visit (from the past 24 hour(s)).    Social History   Tobacco Use  Smoking Status Former   Packs/day: 1.00   Years: 25.00   Pack years: 25.00   Types: Cigarettes   Quit date: 02/19/1993   Years since quitting: 27.5  Smokeless Tobacco Never    Goals Met:  Independence with exercise equipment Exercise tolerated well No report of cardiac concerns or symptoms Strength training completed today  Goals Unmet:  Not Applicable  Comments: checkout time is 1200   Dr. Kathie Dike is Medical Director for [facility].

## 2020-08-28 NOTE — Progress Notes (Signed)
Daily Session Note  Patient Details  Name: James Wolf MRN: [id number] Date of Birth: Aug 13, 1950 Referring Provider:   Flowsheet Row CARDIAC REHAB PHASE II ORIENTATION from 08/23/2020 in [place]  Referring Provider Dr. Roxy Manns       Encounter Date: 08/28/2020  Check In:  Session Check In - 08/28/20 1100       Check-In   Supervising physician immediately available to respond to emergencies [facility] immediately available    Physician(s) Dr. Johnsie Cancel    [facility]    Staff Present Hoy Register, MS, ACSM-CEP, Exercise Physiologist;Other    Virtual Visit No    Medication changes reported     No    Fall or balance concerns reported    No    Tobacco Cessation No Change    Warm-up and Cool-down Performed as group-led instruction    Resistance Training Performed Yes    VAD Patient? No    PAD/SET Patient? No      Pain Assessment   Currently in Pain? No/denies    Pain Score 0-No pain    Multiple Pain Sites No             Capillary Blood Glucose: No results found for this or any previous visit (from the past 24 hour(s)).    Social History   Tobacco Use  Smoking Status Former   Packs/day: 1.00   Years: 25.00   Pack years: 25.00   Types: Cigarettes   Quit date: 02/19/1993   Years since quitting: 27.5  Smokeless Tobacco Never    Goals Met:  Independence with exercise equipment Exercise tolerated well No report of cardiac concerns or symptoms Strength training completed today  Goals Unmet:  Not Applicable  Comments: checkout time is 1200   Dr. Kathie Dike is Medical Director for [facility].

## 2020-08-30 ENCOUNTER — Other Ambulatory Visit: Payer: Self-pay

## 2020-08-30 ENCOUNTER — Encounter (HOSPITAL_COMMUNITY)
Admission: RE | Admit: 2020-08-30 | Discharge: 2020-08-30 | Disposition: A | Discharge status: Home / Self Care | Payer: Medicare Other | Source: Ambulatory Visit | Attending: Cardiology | Admitting: Cardiology

## 2020-08-30 DIAGNOSIS — Z951 Presence of aortocoronary bypass graft: Secondary | ICD-10-CM | POA: Diagnosis not present

## 2020-08-30 DIAGNOSIS — I214 Non-ST elevation (NSTEMI) myocardial infarction: Secondary | ICD-10-CM

## 2020-08-30 NOTE — Progress Notes (Signed)
Daily Session Note  Patient Details  Name: James Wolf MRN: [id number] Date of Birth: 06/02/1950 Referring Provider:   Flowsheet Row CARDIAC REHAB PHASE II ORIENTATION from 08/23/2020 in [place]  Referring Provider Dr. Roxy Manns       Encounter Date: 08/30/2020  Check In:  Session Check In - 08/30/20 1100       Check-In   Supervising physician immediately available to respond to emergencies [facility] immediately available    Physician(s) Dr. Harl Bowie    [facility]    Staff Present Aundra Dubin, RN, BSN;Other   Benay Pillow RN   Virtual Visit No    Medication changes reported     No    Fall or balance concerns reported    No    Tobacco Cessation No Change    Warm-up and Cool-down Performed as group-led Higher education careers adviser Performed Yes    VAD Patient? No    PAD/SET Patient? No      Pain Assessment   Currently in Pain? No/denies    Pain Score 0-No pain    Multiple Pain Sites No             Capillary Blood Glucose: No results found for this or any previous visit (from the past 24 hour(s)).    Social History   Tobacco Use  Smoking Status Former   Packs/day: 1.00   Years: 25.00   Pack years: 25.00   Types: Cigarettes   Quit date: 02/19/1993   Years since quitting: 27.5  Smokeless Tobacco Never    Goals Met:  Independence with exercise equipment Exercise tolerated well No report of cardiac concerns or symptoms Strength training completed today  Goals Unmet:  Not Applicable  Comments: Check out 1200.   Dr. Kathie Dike is Medical Director for [facility].

## 2020-09-01 ENCOUNTER — Encounter (HOSPITAL_COMMUNITY): Payer: Medicare Other

## 2020-09-04 ENCOUNTER — Encounter (HOSPITAL_COMMUNITY)
Admission: RE | Admit: 2020-09-04 | Discharge: 2020-09-04 | Disposition: A | Discharge status: Home / Self Care | Payer: Medicare Other | Source: Ambulatory Visit | Attending: Cardiology | Admitting: Cardiology

## 2020-09-04 ENCOUNTER — Other Ambulatory Visit: Payer: Self-pay

## 2020-09-04 VITALS — Wt 163.4 lb

## 2020-09-04 DIAGNOSIS — I214 Non-ST elevation (NSTEMI) myocardial infarction: Secondary | ICD-10-CM

## 2020-09-04 DIAGNOSIS — Z951 Presence of aortocoronary bypass graft: Secondary | ICD-10-CM | POA: Diagnosis not present

## 2020-09-04 NOTE — Progress Notes (Addendum)
Daily Session Note  Patient Details  Name: James Wolf MRN: [id number] Date of Birth: 10-Feb-1950 Referring Provider:   Flowsheet Row CARDIAC REHAB PHASE II ORIENTATION from 08/23/2020 in [place]  Referring Provider Dr. Roxy Manns       Encounter Date: 09/04/2020  Check In:  Session Check In - 09/04/20 1445       Check-In   Supervising physician immediately available to respond to emergencies [facility] immediately available    Physician(s) Dr. Domenic Polite    [facility]    Staff Present Geanie Cooley, RN;Sabri Teal Wynetta Emery, RN, BSN    Virtual Visit No    Medication changes reported     No    Fall or balance concerns reported    No    Tobacco Cessation No Change    Warm-up and Cool-down Performed as group-led instruction    Resistance Training Performed Yes    VAD Patient? No    PAD/SET Patient? No      Pain Assessment   Currently in Pain? No/denies    Pain Score 0-No pain    Multiple Pain Sites No             Capillary Blood Glucose: No results found for this or any previous visit (from the past 24 hour(s)).    Social History   Tobacco Use  Smoking Status Former   Packs/day: 1.00   Years: 25.00   Pack years: 25.00   Types: Cigarettes   Quit date: 02/19/1993   Years since quitting: 27.5  Smokeless Tobacco Never    Goals Met:  Independence with exercise equipment Exercise tolerated well No report of concerns or symptoms today Strength training completed today  Goals Unmet:  Not Applicable  Comments: Check out 1545.   Dr. Kathie Dike is Medical Director for [facility].

## 2020-09-06 ENCOUNTER — Encounter (HOSPITAL_COMMUNITY)
Admission: RE | Admit: 2020-09-06 | Discharge: 2020-09-06 | Disposition: A | Discharge status: Home / Self Care | Payer: Medicare Other | Source: Ambulatory Visit | Attending: Cardiology | Admitting: Cardiology

## 2020-09-06 ENCOUNTER — Other Ambulatory Visit: Payer: Self-pay

## 2020-09-06 DIAGNOSIS — Z951 Presence of aortocoronary bypass graft: Secondary | ICD-10-CM

## 2020-09-06 DIAGNOSIS — I214 Non-ST elevation (NSTEMI) myocardial infarction: Secondary | ICD-10-CM

## 2020-09-06 NOTE — Progress Notes (Signed)
Daily Session Note  Patient Details  Name: James Wolf MRN: [id number] Date of Birth: Sep 10, 1950 Referring Provider:   Flowsheet Row CARDIAC REHAB PHASE II ORIENTATION from 08/23/2020 in [place]  Referring Provider Dr. Roxy Manns       Encounter Date: 09/06/2020  Check In:  Session Check In - 09/06/20 1100       Check-In   Supervising physician immediately available to respond to emergencies [facility] immediately available    Physician(s) Dr. Domenic Polite    [facility]    Staff Present Hoy Register, MS, ACSM-CEP, Exercise Physiologist;Debra Wynetta Emery, RN, BSN    Virtual Visit No    Medication changes reported     No    Fall or balance concerns reported    No    Tobacco Cessation No Change    Warm-up and Cool-down Performed as group-led instruction    Resistance Training Performed Yes    VAD Patient? No    PAD/SET Patient? No      Pain Assessment   Currently in Pain? No/denies    Pain Score 0-No pain    Multiple Pain Sites No             Capillary Blood Glucose: No results found for this or any previous visit (from the past 24 hour(s)).    Social History   Tobacco Use  Smoking Status Former   Packs/day: 1.00   Years: 25.00   Pack years: 25.00   Types: Cigarettes   Quit date: 02/19/1993   Years since quitting: 27.5  Smokeless Tobacco Never    Goals Met:  Independence with exercise equipment Exercise tolerated well No report of concerns or symptoms today Strength training completed today  Goals Unmet:  Not Applicable  Comments: checkout time is 1200   Dr. Kathie Dike is Medical Director for [facility].

## 2020-09-08 ENCOUNTER — Encounter (HOSPITAL_COMMUNITY)
Admission: RE | Admit: 2020-09-08 | Discharge: 2020-09-08 | Disposition: A | Discharge status: Home / Self Care | Payer: Medicare Other | Source: Ambulatory Visit | Attending: Cardiology | Admitting: Cardiology

## 2020-09-08 ENCOUNTER — Other Ambulatory Visit: Payer: Self-pay

## 2020-09-08 DIAGNOSIS — I214 Non-ST elevation (NSTEMI) myocardial infarction: Secondary | ICD-10-CM | POA: Insufficient documentation

## 2020-09-08 DIAGNOSIS — Z951 Presence of aortocoronary bypass graft: Secondary | ICD-10-CM | POA: Insufficient documentation

## 2020-09-08 NOTE — Progress Notes (Signed)
I have reviewed a Home Exercise Prescription with James Wolf is not currently exercising at home.  The patient was advised to walk 2-4 days a week for 45 minutes.  James Wolf and I discussed how to progress their exercise prescription.  The patient stated that their goals were to get stronger and to return to his ADL's.  The patient stated that they understand the exercise prescription.  We reviewed exercise guidelines, target heart rate during exercise, RPE Scale, weather conditions, NTG use, endpoints for exercise, warmup and cool down.  Patient is encouraged to come to me with any questions. I will continue to follow up with the patient to assist them with progression and safety.

## 2020-09-08 NOTE — Progress Notes (Signed)
Daily Session Note  Patient Details  Name: James Wolf MRN: [id number] Date of Birth: [birth year] Referring Provider:   Flowsheet Row CARDIAC REHAB PHASE II ORIENTATION from 08/23/2020 in [place]  Referring Provider Dr. Roxy Manns       Encounter Date: 09/08/2020  Check In:  Session Check In - 09/08/20 1100       Check-In   Supervising physician immediately available to respond to emergencies [facility] immediately available    Physician(s) Dr. Domenic Polite    [facility]    Staff Present Hoy Register, MS, ACSM-CEP, Exercise Physiologist;Other    Virtual Visit No    Medication changes reported     No    Fall or balance concerns reported    No    Tobacco Cessation No Change    Warm-up and Cool-down Performed as group-led instruction    Resistance Training Performed Yes    VAD Patient? No    PAD/SET Patient? No      Pain Assessment   Currently in Pain? No/denies    Pain Score 0-No pain    Multiple Pain Sites No             Capillary Blood Glucose: No results found for this or any previous visit (from the past 24 hour(s)).    Social History   Tobacco Use  Smoking Status Former   Packs/day: 1.00   Years: 25.00   Pack years: 25.00   Types: Cigarettes   Quit date: 02/19/1993   Years since quitting: 27.5  Smokeless Tobacco Never    Goals Met:  Independence with exercise equipment Exercise tolerated well No report of concerns or symptoms today Strength training completed today  Goals Unmet:  Not Applicable  Comments: checkout time is 1200   Dr. Kathie Dike is Medical Director for [facility] Re[facility].

## 2020-09-11 ENCOUNTER — Encounter (HOSPITAL_COMMUNITY): Payer: Medicare Other

## 2020-09-13 ENCOUNTER — Encounter (HOSPITAL_COMMUNITY)
Admission: RE | Admit: 2020-09-13 | Discharge: 2020-09-13 | Disposition: A | Discharge status: Home / Self Care | Payer: Medicare Other | Source: Ambulatory Visit | Attending: Cardiology | Admitting: Cardiology

## 2020-09-13 ENCOUNTER — Other Ambulatory Visit: Payer: Self-pay

## 2020-09-13 DIAGNOSIS — Z951 Presence of aortocoronary bypass graft: Secondary | ICD-10-CM | POA: Diagnosis not present

## 2020-09-13 DIAGNOSIS — I214 Non-ST elevation (NSTEMI) myocardial infarction: Secondary | ICD-10-CM

## 2020-09-13 NOTE — Progress Notes (Signed)
Daily Session Note  Patient Details  Name: James Wolf MRN: [id number] Date of Birth: 1950-10-08 Referring Provider:   Flowsheet Row CARDIAC REHAB PHASE II ORIENTATION from 08/23/2020 in [place]  Referring Provider Dr. Roxy Manns       Encounter Date: 09/13/2020  Check In:  Session Check In - 09/13/20 1100       Check-In   Supervising physician immediately available to respond to emergencies [facility] immediately available    Physician(s) Dr. Harl Bowie    Location [facility]    Staff Present Hoy Register, MS, ACSM-CEP, Exercise Physiologist;Debra Wynetta Emery, RN, BSN    Virtual Visit No    Medication changes reported     No    Fall or balance concerns reported    No    Tobacco Cessation No Change    Warm-up and Cool-down Performed as group-led instruction    Resistance Training Performed Yes    VAD Patient? No    PAD/SET Patient? No      Pain Assessment   Currently in Pain? No/denies    Pain Score 0-No pain    Multiple Pain Sites No             Capillary Blood Glucose: No results found for this or any previous visit (from the past 24 hour(s)).    Social History   Tobacco Use  Smoking Status Former   Packs/day: 1.00   Years: 25.00   Pack years: 25.00   Types: Cigarettes   Quit date: 02/19/1993   Years since quitting: 27.5  Smokeless Tobacco Never    Goals Met:  Independence with exercise equipment Exercise tolerated well No report of concerns or symptoms today Strength training completed today  Goals Unmet:  Not Applicable  Comments: checkout time is 1200   Dr. Kathie Dike is Medical Director for [facility].

## 2020-09-15 ENCOUNTER — Encounter (HOSPITAL_COMMUNITY)
Admission: RE | Admit: 2020-09-15 | Discharge: 2020-09-15 | Disposition: A | Discharge status: Home / Self Care | Payer: Medicare Other | Source: Ambulatory Visit | Attending: Cardiology | Admitting: Cardiology

## 2020-09-15 ENCOUNTER — Other Ambulatory Visit: Payer: Self-pay

## 2020-09-15 DIAGNOSIS — Z951 Presence of aortocoronary bypass graft: Secondary | ICD-10-CM

## 2020-09-15 DIAGNOSIS — I214 Non-ST elevation (NSTEMI) myocardial infarction: Secondary | ICD-10-CM

## 2020-09-15 NOTE — Progress Notes (Signed)
Daily Session Note  Patient Details  Name: MELVEN STOCKARD MRN: [id number] Date of Birth: 07-17-1950 Referring Provider:   Flowsheet Row CARDIAC REHAB PHASE II ORIENTATION from 08/23/2020 in [place]  Referring Provider Dr. Roxy Manns       Encounter Date: 09/15/2020  Check In:  Session Check In - 09/15/20 1100       Check-In   Supervising physician immediately available to respond to emergencies [facility] MD immediately available    Physician(s) Dr. Harl Bowie    Location [facility]    Staff Present Aundra Dubin, RN, BSN;Phyllis Billingsley, RN    Virtual Visit No    Medication changes reported     No    Fall or balance concerns reported    No    Tobacco Cessation No Change    Warm-up and Cool-down Performed as group-led instruction    Resistance Training Performed Yes    VAD Patient? No    PAD/SET Patient? No      Pain Assessment   Currently in Pain? No/denies    Pain Score 0-No pain    Multiple Pain Sites No             Capillary Blood Glucose: No results found for this or any previous visit (from the past 24 hour(s)).    Social History   Tobacco Use  Smoking Status Former   Packs/day: 1.00   Years: 25.00   Pack years: 25.00   Types: Cigarettes   Quit date: 02/19/1993   Years since quitting: 27.5  Smokeless Tobacco Never    Goals Met:  Independence with exercise equipment Exercise tolerated well No report of concerns or symptoms today Strength training completed today  Goals Unmet:  Not Applicable  Comments: Check out 1200.   Dr. Kathie Dike is Medical Director for [facility] Pul[facility] Re[facility].

## 2020-09-18 ENCOUNTER — Encounter (HOSPITAL_COMMUNITY)
Admission: RE | Admit: 2020-09-18 | Discharge: 2020-09-18 | Disposition: A | Discharge status: Home / Self Care | Payer: Medicare Other | Source: Ambulatory Visit | Attending: Cardiology | Admitting: Cardiology

## 2020-09-18 ENCOUNTER — Other Ambulatory Visit: Payer: Self-pay

## 2020-09-18 VITALS — Wt 164.2 lb

## 2020-09-18 DIAGNOSIS — I214 Non-ST elevation (NSTEMI) myocardial infarction: Secondary | ICD-10-CM

## 2020-09-18 DIAGNOSIS — Z951 Presence of aortocoronary bypass graft: Secondary | ICD-10-CM

## 2020-09-18 NOTE — Progress Notes (Signed)
Daily Session Note  Patient Details  Name: James Wolf MRN: [id number] Date of Birth: [birth year] Referring Provider:   Flowsheet Row CARDIAC REHAB PHASE II ORIENTATION from 08/23/2020 in [place]  Referring Provider Dr. Roxy Manns       Encounter Date: 09/18/2020  Check In:  Session Check In - 09/18/20 1100       Check-In   Supervising physician immediately available to respond to emergencies [facility] MD immediately available    Physician(s) Domenic Polite    Location [facility]    Staff Present Aundra Dubin, RN, BSN;Phyllis Billingsley, RN    Virtual Visit No    Medication changes reported     No    Fall or balance concerns reported    No    Tobacco Cessation No Change    Warm-up and Cool-down Performed as group-led instruction    Resistance Training Performed Yes    PAD/SET Patient? No      Pain Assessment   Currently in Pain? No/denies    Pain Score 0-No pain    Multiple Pain Sites No             Capillary Blood Glucose: No results found for this or any previous visit (from the past 24 hour(s)).    Social History   Tobacco Use  Smoking Status Former   Packs/day: 1.00   Years: 25.00   Pack years: 25.00   Types: Cigarettes   Quit date: 02/19/1993   Years since quitting: 27.5  Smokeless Tobacco Never    Goals Met:  Independence with exercise equipment Exercise tolerated well No report of concerns or symptoms today Strength training completed today  Goals Unmet:  Not Applicable  Comments: Check out 1200.   Dr. Kathie Dike is Medical Director for [facility].

## 2020-09-19 DIAGNOSIS — D649 Anemia, unspecified: Secondary | ICD-10-CM | POA: Insufficient documentation

## 2020-09-19 DIAGNOSIS — M810 Age-related osteoporosis without current pathological fracture: Secondary | ICD-10-CM | POA: Insufficient documentation

## 2020-09-19 DIAGNOSIS — Z9181 History of falling: Secondary | ICD-10-CM | POA: Insufficient documentation

## 2020-09-19 DIAGNOSIS — Z87891 Personal history of nicotine dependence: Secondary | ICD-10-CM | POA: Insufficient documentation

## 2020-09-19 DIAGNOSIS — M25512 Pain in left shoulder: Secondary | ICD-10-CM | POA: Insufficient documentation

## 2020-09-19 DIAGNOSIS — R7989 Other specified abnormal findings of blood chemistry: Secondary | ICD-10-CM | POA: Insufficient documentation

## 2020-09-19 DIAGNOSIS — N200 Calculus of kidney: Secondary | ICD-10-CM | POA: Insufficient documentation

## 2020-09-20 ENCOUNTER — Other Ambulatory Visit: Payer: Self-pay

## 2020-09-20 ENCOUNTER — Ambulatory Visit (INDEPENDENT_AMBULATORY_CARE_PROVIDER_SITE_OTHER): Payer: Medicare Other | Admitting: Orthopedic Surgery

## 2020-09-20 ENCOUNTER — Encounter: Payer: Self-pay | Admitting: Orthopedic Surgery

## 2020-09-20 ENCOUNTER — Ambulatory Visit: Payer: Medicare Other

## 2020-09-20 ENCOUNTER — Encounter (HOSPITAL_COMMUNITY)
Admission: RE | Admit: 2020-09-20 | Discharge: 2020-09-20 | Disposition: A | Discharge status: Home / Self Care | Payer: Medicare Other | Source: Ambulatory Visit | Attending: Cardiology | Admitting: Cardiology

## 2020-09-20 VITALS — BP 131/74 | HR 65 | Ht 65.0 in | Wt 165.0 lb

## 2020-09-20 DIAGNOSIS — Z9889 Other specified postprocedural states: Secondary | ICD-10-CM | POA: Diagnosis not present

## 2020-09-20 DIAGNOSIS — I6523 Occlusion and stenosis of bilateral carotid arteries: Secondary | ICD-10-CM

## 2020-09-20 DIAGNOSIS — G8929 Other chronic pain: Secondary | ICD-10-CM

## 2020-09-20 DIAGNOSIS — M25511 Pain in right shoulder: Secondary | ICD-10-CM | POA: Diagnosis not present

## 2020-09-20 DIAGNOSIS — I214 Non-ST elevation (NSTEMI) myocardial infarction: Secondary | ICD-10-CM

## 2020-09-20 DIAGNOSIS — Z951 Presence of aortocoronary bypass graft: Secondary | ICD-10-CM

## 2020-09-20 NOTE — Progress Notes (Signed)
Daily Session Note  Patient Details  Name: James Wolf MRN: [id number] Date of Birth: 1950-10-01 Referring Provider:   Flowsheet Row CARDIAC REHAB PHASE II ORIENTATION from 08/23/2020 in [place]  Referring Provider Dr. Roxy Manns       Encounter Date: 09/20/2020  Check In:  Session Check In - 09/20/20 1100       Check-In   Supervising physician immediately available to respond to emergencies [facility] MD immediately available    Physician(s) Domenic Polite    Location [facility]    Staff Present Hoy Register, MS, ACSM-CEP, Exercise Physiologist;Debra Wynetta Emery, RN, BSN;Other    Virtual Visit No    Medication changes reported     No    Fall or balance concerns reported    No    Tobacco Cessation No Change    Warm-up and Cool-down Performed as group-led instruction    Resistance Training Performed Yes    VAD Patient? No    PAD/SET Patient? No      Pain Assessment   Currently in Pain? No/denies    Pain Score 0-No pain    Multiple Pain Sites No             Capillary Blood Glucose: No results found for this or any previous visit (from the past 24 hour(s)).    Social History   Tobacco Use  Smoking Status Former   Packs/day: 1.00   Years: 25.00   Pack years: 25.00   Types: Cigarettes   Quit date: 02/19/1993   Years since quitting: 27.6  Smokeless Tobacco Never    Goals Met:  Independence with exercise equipment Exercise tolerated well No report of concerns or symptoms today Strength training completed today  Goals Unmet:  Not Applicable  Comments: checkout time is 1200   Dr. Kathie Dike is Medical Director for [facility] Pulmonary Rehab.

## 2020-09-20 NOTE — Progress Notes (Signed)
Chief Complaint  Patient presents with   Shoulder Pain    has Right shoulder pain. 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  He has good range of motion but weakness when he holds things away from his body or reaches up above his head although his range of motion is maintained  Review of systems  No current chest pain no numbness or tingling or neck pain  BP 131/74   Pulse 65   Ht '5\' 5"'$  (1.651 m)   Wt 165 lb (74.8 kg)   BMI 27.46 kg/m   He is awake alert and oriented x3 mood and affect are normal  And hygiene normal appearance otherwise normal body habitus normal  Neurovascular exam intact good pulse and perfusion normal sensation  Right shoulder tenderness full range of motion however he has weakness in abduction and flexion of basically 3-4 out of 5  Stability tests were normal  Imaging mild glenohumeral joint narrowing nothing significant 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

## 2020-09-20 NOTE — Progress Notes (Signed)
Cardiac Individual Treatment Plan  Patient Details  Name: James Wolf MRN: [id number] Date of Birth: Dec 28, 1950 Referring Provider:   Flowsheet Row CARDIAC REHAB PHASE II ORIENTATION from 08/23/2020 in [facility]  Referring Provider Dr. Roxy Manns       Initial Encounter Date:  Flowsheet Row CARDIAC REHAB PHASE II ORIENTATION from 08/23/2020 in [facility]  Date 08/23/20       Visit Diagnosis: S/P CABG x 3  NSTEMI (non-ST elevated myocardial infarction) ([place])  Patient's Home Medications on Admission:  Current Outpatient Medications:    albuterol (VENTOLIN HFA) 108 (90 Base) MCG/ACT inhaler, Inhale 1-2 puffs into the lungs every 6 (six) hours as needed for wheezing or shortness of breath., Disp: , Rfl:    aspirin EC 81 MG EC tablet, Take 1 tablet (81 mg total) by mouth daily. Swallow whole., Disp: 30 tablet, Rfl: 11   B Complex Vitamins (VITAMIN B COMPLEX PO), Take 1 tablet by mouth daily. , Disp: , Rfl:    cholecalciferol (VITAMIN D3) 25 MCG (1000 UNIT) tablet, Take 1,000 Units by mouth daily., Disp: , Rfl:    clopidogrel (PLAVIX) 75 MG tablet, Take 1 tablet (75 mg total) by mouth daily., Disp: 30 tablet, Rfl: 11   diclofenac sodium (VOLTAREN) 1 % GEL, Apply 2 g topically 2 (two) times daily as needed (pain). , Disp: , Rfl:    esomeprazole (NEXIUM) 40 MG capsule, Take 40 mg by mouth 2 (two) times daily. , Disp: , Rfl:    ezetimibe (ZETIA) 10 MG tablet, Take 1 tablet (10 mg total) by mouth daily., Disp: 90 tablet, Rfl: 1   metoprolol tartrate (LOPRESSOR) 100 MG tablet, Take 1 tablet (100 mg total) by mouth 2 (two) times daily. (Patient taking differently: Take 50 mg by mouth 2 (two) times daily.), Disp: 60 tablet, Rfl: 3   rosuvastatin (CRESTOR) 40 MG tablet, Take 1 tablet (40 mg total) by mouth daily at 6 PM., Disp: 30 tablet, Rfl: 3  Past Medical History: Past Medical History:  Diagnosis Date   Anemia    Aspiration pneumonia ([place])     Cancer ([place])    esophageal cancer 1995   Chest pain    COPD (chronic obstructive pulmonary disease) ([place])    GERD (gastroesophageal reflux disease)    History of kidney stones    Hyperlipidemia    Hypertension    Joint pain    Osteomyelitis ([facility])    Osteoporosis    Rheumatic fever    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425 7.342(L) 7.358 7.342(L)   PCO2ART 32.0 - 48.0 mmHg - 39.1 42.3  43.1 44.4   HCO3 20.0 - 28.0 mmol/L - 25.7 23.3 24.3 24.0   TCO2 22 - 32 mmol/L '27 27 25 26 25   '$ ACIDBASEDEF 0.0 - 2.0 mmol/L - - 3.0(H) 1.0 2.0   O2SAT % - 100.0 96.0 96.0 99.0       Capillary Blood Glucose: Lab Results  Component Value Date   GLUCAP 167 (H) 06/08/2020   GLUCAP 123 (H) 06/05/2020   GLUCAP 130 (H) 06/04/2020   GLUCAP 113 (H) 06/04/2020   GLUCAP 116 (H) 06/04/2020     Exercise Target Goals: Exercise Program Goal: Individual exercise prescription set using results from initial 6 min walk test and THRR while considering  patient's activity barriers and safety.   Exercise Prescription Goal: Starting with aerobic activity 30 plus minutes a day, 3 days per week for initial exercise prescription. Provide home exercise prescription and guidelines that participant acknowledges understanding prior to discharge.  Activity Barriers & Risk Stratification:  Activity Barriers &  Cardiac Risk Stratification - 08/23/20 1307       Activity Barriers & Cardiac Risk Stratification   Activity Barriers Back Problems;Joint Problems;Shortness of Breath    Cardiac Risk Stratification High             6 Minute Walk:  6 Minute Walk     Row Name 08/23/20 1428         6 Minute Walk   Distance 1150 feet     Walk Time 6 minutes     # of Rest Breaks 0     MPH 2.18     METS 2.31     RPE 11     VO2 Peak 8.09     Symptoms No              Oxygen Initial Assessment:   Oxygen Re-Evaluation:   Oxygen Discharge (Final Oxygen Re-Evaluation):   Initial Exercise Prescription:  Initial Exercise Prescription - 08/23/20 1400       Date of Initial Exercise RX and Referring Provider   Date 08/23/20    Referring Provider Dr. Roxy Manns    Expected Discharge Date 11/17/20      Treadmill   MPH 1.8    Grade 0    Minutes 17      NuStep   Level 1    SPM 80    Minutes 22      Prescription Details   Frequency (times per week) 3    Duration Progress to 30 minutes of continuous aerobic without signs/symptoms of physical distress      Intensity   THRR 40-80% of Max Heartrate 60-121    Ratings of Perceived Exertion 11-13    Perceived Dyspnea 0-4      Resistance Training   Training Prescription Yes    Weight 4 lbs    Reps 10-15             Perform Capillary Blood Glucose checks as needed.  Exercise Prescription Changes:   Exercise Prescription Changes     Row Name 09/04/20 1100 09/08/20 1100 09/18/20 1100         Response to Exercise   Blood Pressure (Admit) 110/60 -- 118/62     Blood Pressure (Exercise) 138/68 -- 112/62     Blood Pressure (Exit) 102/60 -- 108/62     Heart Rate (Admit) 67 bpm -- 67 bpm     Heart Rate (Exercise) 87 bpm -- 80 bpm     Heart Rate (Exit) 76 bpm --  75 bpm     Rating of Perceived Exertion (Exercise) 11 -- 13     Duration Continue with 30 min of aerobic exercise without signs/symptoms of physical distress. --  Continue with 30 min of aerobic exercise without signs/symptoms of physical distress.     Intensity THRR unchanged -- THRR unchanged           Progression   Progression Continue to progress workloads to maintain intensity without signs/symptoms of physical distress. -- Continue to progress workloads to maintain intensity without signs/symptoms of physical distress.           Resistance Training   Training Prescription Yes -- Yes     Weight 3 lbs -- 4 lbs     Reps 10-15 -- 10-15     Time 10 Minutes -- 10 Minutes           Treadmill   MPH 1.8 -- 1.9     Grade 0 -- 0     Minutes 17 -- 17     METs 2.38 -- 2.45           NuStep   Level 1 -- 2     SPM 80 -- 77     Minutes 22 -- 22     METs 1.82 -- 1.98           Home Exercise Plan   Plans to continue exercise at -- Home (comment) --     Frequency -- Add 2 additional days to program exercise sessions. --     Initial Home Exercises Provided -- 09/08/20 --              Exercise Comments:   Exercise Comments     Row Name 09/08/20 1131           Exercise Comments home exercise reviewed                Exercise Goals and Review:   Exercise Goals     Row Name 08/23/20 1429 09/19/20 0824           Exercise Goals   Increase Physical Activity Yes Yes      Intervention Provide advice, education, support and counseling about physical activity/exercise needs.;Develop an individualized exercise prescription for aerobic and resistive training based on initial evaluation findings, risk stratification, comorbidities and participant's personal goals. Provide advice, education, support and counseling about physical activity/exercise needs.;Develop an individualized exercise prescription for aerobic and resistive training based on initial evaluation findings, risk stratification, comorbidities and participant's personal goals.      Expected Outcomes Short Term: Attend rehab on a regular basis to increase amount of physical  activity.;Long Term: Add in home exercise to make exercise part of routine and to increase amount of physical activity.;Long Term: Exercising regularly at least 3-5 days a week. Short Term: Attend rehab on a regular basis to increase amount of physical activity.;Long Term: Add in home exercise to make exercise part of routine and to increase amount of physical activity.;Long Term: Exercising regularly at least 3-5 days a week.      Increase Strength and Stamina Yes Yes      Intervention Provide advice, education, support and counseling about physical activity/exercise needs.;Develop an individualized exercise prescription for aerobic and resistive training based on initial evaluation findings, risk stratification, comorbidities and participant's personal goals. Provide advice, education, support and counseling about physical activity/exercise needs.;Develop an individualized exercise prescription for aerobic and resistive training based on initial evaluation findings, risk stratification, comorbidities  and participant's personal goals.      Expected Outcomes Short Term: Increase workloads from initial exercise prescription for resistance, speed, and METs.;Short Term: Perform resistance training exercises routinely during rehab and add in resistance training at home;Long Term: Improve cardiorespiratory fitness, muscular endurance and strength as measured by increased METs and functional capacity (6MWT) Short Term: Increase workloads from initial exercise prescription for resistance, speed, and METs.;Short Term: Perform resistance training exercises routinely during rehab and add in resistance training at home;Long Term: Improve cardiorespiratory fitness, muscular endurance and strength as measured by increased METs and functional capacity (6MWT)      Able to understand and use rate of perceived exertion (RPE) scale Yes Yes      Intervention Provide education and explanation on how to use RPE scale Provide  education and explanation on how to use RPE scale      Expected Outcomes Short Term: Able to use RPE daily in rehab to express subjective intensity level;Long Term:  Able to use RPE to guide intensity level when exercising independently Short Term: Able to use RPE daily in rehab to express subjective intensity level;Long Term:  Able to use RPE to guide intensity level when exercising independently      Knowledge and understanding of Target Heart Rate Range (THRR) Yes Yes      Intervention Provide education and explanation of THRR including how the numbers were predicted and where they are located for reference Provide education and explanation of THRR including how the numbers were predicted and where they are located for reference      Expected Outcomes Short Term: Able to state/look up THRR;Long Term: Able to use THRR to govern intensity when exercising independently;Short Term: Able to use daily as guideline for intensity in rehab Short Term: Able to state/look up THRR;Long Term: Able to use THRR to govern intensity when exercising independently;Short Term: Able to use daily as guideline for intensity in rehab      Able to check pulse independently Yes Yes      Intervention Provide education and demonstration on how to check pulse in carotid and radial arteries.;Review the importance of being able to check your own pulse for safety during independent exercise Provide education and demonstration on how to check pulse in carotid and radial arteries.;Review the importance of being able to check your own pulse for safety during independent exercise      Expected Outcomes Short Term: Able to explain why pulse checking is important during independent exercise;Long Term: Able to check pulse independently and accurately Short Term: Able to explain why pulse checking is important during independent exercise;Long Term: Able to check pulse independently and accurately      Understanding of Exercise Prescription Yes  Yes      Intervention Provide education, explanation, and written materials on patient's individual exercise prescription Provide education, explanation, and written materials on patient's individual exercise prescription      Expected Outcomes Short Term: Able to explain program exercise prescription;Long Term: Able to explain home exercise prescription to exercise independently Short Term: Able to explain program exercise prescription;Long Term: Able to explain home exercise prescription to exercise independently               Exercise Goals Re-Evaluation :  Exercise Goals Re-Evaluation     Row Name 09/19/20 0824             Exercise Goal Re-Evaluation   Exercise Goals Review Increase Physical Activity;Increase Strength and Stamina;Able to understand and use  rate of perceived exertion (RPE) scale;Knowledge and understanding of Target Heart Rate Range (THRR);Able to check pulse independently;Understanding of Exercise Prescription       Comments Pt has completed 9 sessions of cardiac rehab. He frequently arrives late and is not able to do the warm up. I believe that he could push himself harder as well while he is on the stepper. He is currently exercising at 1.98 METs on the stepper. Will continue to monitor and progress as able.       Expected Outcomes Through exercise at home and at rehab, the patient will meet their stated goals.                 Discharge Exercise Prescription (Final Exercise Prescription Changes):  Exercise Prescription Changes - 09/18/20 1100       Response to Exercise   Blood Pressure (Admit) 118/62    Blood Pressure (Exercise) 112/62    Blood Pressure (Exit) 108/62    Heart Rate (Admit) 67 bpm    Heart Rate (Exercise) 80 bpm    Heart Rate (Exit) 75 bpm    Rating of Perceived Exertion (Exercise) 13    Duration Continue with 30 min of aerobic exercise without signs/symptoms of physical distress.    Intensity THRR unchanged      Progression    Progression Continue to progress workloads to maintain intensity without signs/symptoms of physical distress.      Resistance Training   Training Prescription Yes    Weight 4 lbs    Reps 10-15    Time 10 Minutes      Treadmill   MPH 1.9    Grade 0    Minutes 17    METs 2.45      NuStep   Level 2    SPM 77    Minutes 22    METs 1.98             Nutrition:  Target Goals: Understanding of nutrition guidelines, daily intake of sodium '1500mg'$ , cholesterol '200mg'$ , calories 30% from fat and 7% or less from saturated fats, daily to have 5 or more servings of fruits and vegetables.  Biometrics:  Pre Biometrics - 08/23/20 1430       Pre Biometrics   Height '5\' 5"'$  (1.651 m)    Weight 74.1 kg    Waist Circumference 40 inches    Hip Circumference 40 inches    Waist to Hip Ratio 1 %    BMI (Calculated) 27.18    Triceps Skinfold 15 mm    % Body Fat 27.8 %    Grip Strength 31.6 kg    Flexibility 16 in    Single Leg Stand 14.22 seconds              Nutrition Therapy Plan and Nutrition Goals:  Nutrition Therapy & Goals - 08/23/20 1314       Personal Nutrition Goals   Comments Patietn scored 42 on his diet assessment. Handout provided and discussed with he and his wife regarding healthier choices. We offer 2 educational sessions on heart healthy nutrition with handouts and RD referral if patient is interested.      Intervention Plan   Intervention Nutrition handout(s) given to patient.             Nutrition Assessments:  Nutrition Assessments - 08/23/20 1314       MEDFICTS Scores   Pre Score 42            MEDIFICTS Score  Key: ?70 Need to make dietary changes  40-70 Heart Healthy Diet ? 40 Therapeutic Level Cholesterol Diet   Picture Your Plate Scores: [id number] Unhealthy dietary pattern with much room for improvement. 41-50 Dietary pattern unlikely to meet recommendations for good health and room for improvement. 51-60 More healthful dietary pattern,  with some room for improvement.  >60 Healthy dietary pattern, although there may be some specific behaviors that could be improved.    Nutrition Goals Re-Evaluation:   Nutrition Goals Discharge (Final Nutrition Goals Re-Evaluation):   Psychosocial: Target Goals: Acknowledge presence or absence of significant depression and/or stress, maximize coping skills, provide positive support system. Participant is able to verbalize types and ability to use techniques and skills needed for reducing stress and depression.  Initial Review & Psychosocial Screening:  Initial Psych Review & Screening - 08/23/20 1400       Initial Review   Current issues with Current Sleep Concerns      Family Dynamics   Good Support System? Yes      Barriers   Psychosocial barriers to participate in program Psychosocial barriers identified (see note)      Screening Interventions   Interventions Encouraged to exercise;To provide support and resources with identified psychosocial needs;Provide feedback about the scores to participant    Expected Outcomes Short Term goal: Identification and review with participant of any Quality of Life or Depression concerns found by scoring the questionnaire.             Quality of Life Scores:  Quality of Life - 08/23/20 1427       Quality of Life   Select Quality of Life      Quality of Life Scores   Health/Function Pre 23.9 %    Socioeconomic Pre 26.57 %    Psych/Spiritual Pre 24.86 %    Family Pre 28.8 %    GLOBAL Pre 25.37 %            Scores of 19 and below usually indicate a poorer quality of life in these areas.  A difference of  2-3 points is a clinically meaningful difference.  A difference of 2-3 points in the total score of the Quality of Life Index has been associated with significant improvement in overall quality of life, self-image, physical symptoms, and general health in studies assessing change in quality of life.  PHQ-9: Recent Review  Flowsheet Data     Depression screen [facility] 2/9 08/23/2020   Decreased Interest 0   Down, Depressed, Hopeless 0   PHQ - 2 Score 0   Altered sleeping 1   Tired, decreased energy 1   Change in appetite 0   Feeling bad or failure about yourself  0   Trouble concentrating 0   Moving slowly or fidgety/restless 0   Suicidal thoughts 0   PHQ-9 Score 2   Difficult doing work/chores Not difficult at all      Interpretation of Total Score  Total Score Depression Severity:  1-4 = Minimal depression, 5-9 = Mild depression, 10-14 = Moderate depression, 15-19 = Moderately severe depression, 20-27 = Severe depression   Psychosocial Evaluation and Intervention:  Psychosocial Evaluation - 08/23/20 1401       Psychosocial Evaluation & Interventions   Interventions Stress management education;Relaxation education;Encouraged to exercise with the program and follow exercise prescription    Comments Patient has no psychosocial barriers identified to participate in CR. His initial PHQ-9 score was 2 and his QOL score was 25.37 overall. He live  with his wife of many years. He has one daughter and 3 grandchildren who are all very supportative and involved in his life. 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 He denies any depression or anxiety or stress. He says he does have trouble falling asleep some nights but he does not take anything. He wants to improve his strength and stamina and SOB with activities and is looking forward to starting the program.    Expected Outcomes Patient will continue to have no psychosocial barriers identified.    Continue Psychosocial Services  No Follow up required             Psychosocial Re-Evaluation:  Psychosocial Re-Evaluation     [place] Name 09/13/20 0932             Psychosocial Re-Evaluation   Current issues with None Identified       Comments Patient is new to the program completing 6 sessions. He continues to have no psychosocial barriers  identified. He seems to enjoy the program and is very interactive with staff and others in his class. We will continue to monitor.       Expected Outcomes Patient will continue to have no psychosocial barriers identified.       Interventions Encouraged to attend Cardiac Rehabilitation for the exercise;Stress management education;Relaxation education       Continue Psychosocial Services  No Follow up required                Psychosocial Discharge (Final Psychosocial Re-Evaluation):  Psychosocial Re-Evaluation - 09/13/20 0932       Psychosocial Re-Evaluation   Current issues with None Identified    Comments Patient is new to the program completing 6 sessions. He continues to have no psychosocial barriers identified. He seems to enjoy the program and is very interactive with staff and others in his class. We will continue to monitor.    Expected Outcomes Patient will continue to have no psychosocial barriers identified.    Interventions Encouraged to attend Cardiac Rehabilitation for the exercise;Stress management education;Relaxation education    Continue Psychosocial Services  No Follow up required             Vocational Rehabilitation: Provide vocational rehab assistance to qualifying candidates.   Vocational Rehab Evaluation & Intervention:  Vocational Rehab - 08/23/20 1315       Initial Vocational Rehab Evaluation & Intervention   Assessment shows need for Vocational Rehabilitation No      Vocational Rehab Re-Evaulation   Comments Remigio Eisenmenger is retired and does not need vocational rehab.             Education: Education Goals: Education classes will be provided on a weekly basis, covering required topics. Participant will state understanding/return demonstration of topics presented.  Learning Barriers/Preferences:  Learning Barriers/Preferences - 08/23/20 1358       Learning Barriers/Preferences   Learning Barriers None    Learning Preferences Skilled  Demonstration             Education Topics: Hypertension, Hypertension Reduction -Define heart disease and high blood pressure. Discus how high blood pressure affects the body and ways to reduce high blood pressure.   Exercise and Your Heart -Discuss why it is important to exercise, the FITT principles of exercise, normal and abnormal responses to exercise, and how to exercise safely.   Angina -Discuss definition of angina, causes of angina, treatment of angina, and how to decrease risk of having angina.   Cardiac  Medications -Review what the following cardiac medications are used for, how they affect the body, and side effects that may occur when taking the medications.  Medications include Aspirin, Beta blockers, calcium channel blockers, ACE Inhibitors, angiotensin receptor blockers, diuretics, digoxin, and antihyperlipidemics.   Congestive Heart Failure -Discuss the definition of CHF, how to live with CHF, the signs and symptoms of CHF, and how keep track of weight and sodium intake.   Heart Disease and Intimacy -Discus the effect sexual activity has on the heart, how changes occur during intimacy as we age, and safety during sexual activity.   Smoking Cessation / COPD -Discuss different methods to quit smoking, the health benefits of quitting smoking, and the definition of COPD.   Nutrition I: Fats -Discuss the types of cholesterol, what cholesterol does to the heart, and how cholesterol levels can be controlled. Flowsheet Row CARDIAC REHAB PHASE II EXERCISE from 09/13/2020 in [place]  Date 08/30/20  Educator DJ  Instruction Review Code 1- Verbalizes Understanding       Nutrition II: Labels -Discuss the different components of food labels and how to read food label [place] from 09/13/2020 in [place]  Date 09/13/20  Educator DF  Instruction Review Code 2- Demonstrated  Understanding       Heart Parts/Heart Disease and PAD -Discuss the anatomy of the heart, the pathway of blood circulation through the heart, and these are affected by heart disease.   Stress I: Signs and Symptoms -Discuss the causes of stress, how stress may lead to anxiety and depression, and ways to limit stress.   Stress II: Relaxation -Discuss different types of relaxation techniques to limit stress.   Warning Signs of Stroke / TIA -Discuss definition of a stroke, what the signs and symptoms are of a stroke, and how to identify when someone is having stroke.   Knowledge Questionnaire Score:  Knowledge Questionnaire Score - 08/23/20 1358       Knowledge Questionnaire Score   Pre Score 20/24             Core Components/Risk Factors/Patient Goals at Admission:  Personal Goals and Risk Factors at Admission - 08/23/20 1358       Core Components/Risk Factors/Patient Goals on Admission    Weight Management Weight Maintenance    Improve shortness of breath with ADL's Yes    Intervention Provide education, individualized exercise plan and daily activity instruction to help decrease symptoms of SOB with activities of daily living.    Expected Outcomes Short Term: Improve cardiorespiratory fitness to achieve a reduction of symptoms when performing ADLs;Long Term: Be able to perform more ADLs without symptoms or delay the onset of symptoms    Personal Goal Other Yes    Personal Goal Increase strength and stamina. Improve SOB with activities.    Intervention Patient will attend CR 3 days/week and supplement with exercise 2 days/week at home.    Expected Outcomes Patient will meeting both program and personal goals.             Core Components/Risk Factors/Patient Goals Review:   Goals and Risk Factor Review     Row Name 09/13/20 0933             Core Components/Risk Factors/Patient Goals Review   Personal Goals Review Improve shortness of breath with ADL's;Other        Review Patient was referred to CR with S/P CABGx3. He has multiple risk factors  for CAD and is participating in the program for risk modification. He has completed 6 sessions maintaining his weight since his initial visit. His blood pressure is well controlled. His personal goals for the program are to increase his strength and stamina and improve his SOB with activities. We will continue to monitor his progress as he works towards meeting these goals.       Expected Outcomes Patient will complete the program meeting both personal and program goals.                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 He has multiple risk factors for CAD and is participating in the program for risk modification. He has completed 6 sessions maintaining his weight since his initial visit. His blood pressure is well controlled. His personal goals for the program are to increase his strength and stamina and improve his SOB with activities. We will continue to monitor his progress as he works towards meeting these goals.    Expected Outcomes Patient will complete the program meeting both personal and program goals.             ITP Comments:   Comments: ITP REVIEW Pt is making expected progress toward Cardiac Rehab goals after completing 9 sessions. Recommend continued exercise, life style modification, education, and increased stamina and strength.

## 2020-09-21 ENCOUNTER — Other Ambulatory Visit: Payer: Self-pay | Admitting: Physician Assistant

## 2020-09-22 ENCOUNTER — Other Ambulatory Visit: Payer: Self-pay

## 2020-09-22 ENCOUNTER — Encounter (HOSPITAL_COMMUNITY)
Admission: RE | Admit: 2020-09-22 | Discharge: 2020-09-22 | Disposition: A | Discharge status: Home / Self Care | Payer: Medicare Other | Source: Ambulatory Visit | Attending: Cardiology | Admitting: Cardiology

## 2020-09-22 DIAGNOSIS — I214 Non-ST elevation (NSTEMI) myocardial infarction: Secondary | ICD-10-CM

## 2020-09-22 DIAGNOSIS — Z951 Presence of aortocoronary bypass graft: Secondary | ICD-10-CM | POA: Diagnosis not present

## 2020-09-22 NOTE — Progress Notes (Signed)
Daily Session Note  Patient Details  Name: James Wolf MRN: [id number] Date of Birth: 1950/12/31 Referring Provider:   Flowsheet Row CARDIAC REHAB PHASE II ORIENTATION from 08/23/2020 in [place]  Referring Provider Dr. Roxy Manns       Encounter Date: 09/22/2020  Check In:  Session Check In - 09/22/20 1100       Check-In   Supervising physician immediately available to respond to emergencies [facility] MD immediately available    Physician(s) Dr. Harrington Challenger    Location [facility]    Staff Present Irish Lack, RN, Bjorn Loser, MS, ACSM-CEP, Exercise Physiologist    Virtual Visit No    Medication changes reported     No    Fall or balance concerns reported    No    Tobacco Cessation No Change    Warm-up and Cool-down Performed as group-led instruction    Resistance Training Performed Yes    VAD Patient? No    PAD/SET Patient? No      Pain Assessment   Currently in Pain? No/denies    Pain Score 0-No pain    Multiple Pain Sites No             Capillary Blood Glucose: No results found for this or any previous visit (from the past 24 hour(s)).    Social History   Tobacco Use  Smoking Status Former   Packs/day: 1.00   Years: 25.00   Pack years: 25.00   Types: Cigarettes   Quit date: 02/19/1993   Years since quitting: 27.6  Smokeless Tobacco Never    Goals Met:  Independence with exercise equipment Exercise tolerated well No report of concerns or symptoms today Strength training completed today  Goals Unmet:  Not Applicable  Comments: check out 1200   Dr. Kathie Dike is Medical Director for [facility].

## 2020-09-25 ENCOUNTER — Encounter (HOSPITAL_COMMUNITY)
Admission: RE | Admit: 2020-09-25 | Discharge: 2020-09-25 | Disposition: A | Discharge status: Home / Self Care | Payer: Medicare Other | Source: Ambulatory Visit | Attending: Cardiology | Admitting: Cardiology

## 2020-09-25 ENCOUNTER — Other Ambulatory Visit: Payer: Self-pay

## 2020-09-25 DIAGNOSIS — Z951 Presence of aortocoronary bypass graft: Secondary | ICD-10-CM

## 2020-09-25 DIAGNOSIS — I214 Non-ST elevation (NSTEMI) myocardial infarction: Secondary | ICD-10-CM

## 2020-09-25 NOTE — Progress Notes (Signed)
Daily Session Note  Patient Details  Name: James Wolf MRN: [id number] Date of Birth: 11/10/1950 Referring Provider:   Flowsheet Row CARDIAC REHAB PHASE II ORIENTATION from 08/23/2020 in [place] [facility]  Referring Provider Dr. Owen       Encounter Date: 09/25/2020  Check In:  Session Check In - 09/25/20 1100       Check-In   Supervising physician immediately available to respond to emergencies [facility] MD immediately available    Physician(s) Dr. Branch    Location [facility]    Staff Present Dalton Fletcher, MS, ACSM-CEP, Exercise Physiologist;Debra Johnson, RN, BSN;Heather Jachimiak, BS, Exercise Physiologist    Virtual Visit No    Medication changes reported     No    Fall or balance concerns reported    No    Tobacco Cessation No Change    Warm-up and Cool-down Performed as group-led instruction    Resistance Training Performed Yes    VAD Patient? No    PAD/SET Patient? No      Pain Assessment   Currently in Pain? No/denies    Pain Score 0-No pain    Multiple Pain Sites No             Capillary Blood Glucose: No results found for this or any previous visit (from the past 24 hour(s)).    Social History   Tobacco Use  Smoking Status Former   Packs/day: 1.00   Years: 25.00   Pack years: 25.00   Types: Cigarettes   Quit date: 02/19/1993   Years since quitting: 27.6  Smokeless Tobacco Never    Goals Met:  Independence with exercise equipment Exercise tolerated well No report of concerns or symptoms today Strength training completed today  Goals Unmet:  Not Applicable  Comments: checkout time is 1200   Dr. Jehanzeb Memon is Medical Director for [facility]. 

## 2020-09-27 ENCOUNTER — Encounter (HOSPITAL_COMMUNITY)
Admission: RE | Admit: 2020-09-27 | Discharge: 2020-09-27 | Disposition: A | Discharge status: Home / Self Care | Payer: Medicare Other | Source: Ambulatory Visit | Attending: Cardiology | Admitting: Cardiology

## 2020-09-27 ENCOUNTER — Other Ambulatory Visit: Payer: Self-pay

## 2020-09-27 DIAGNOSIS — Z951 Presence of aortocoronary bypass graft: Secondary | ICD-10-CM | POA: Diagnosis not present

## 2020-09-27 DIAGNOSIS — I214 Non-ST elevation (NSTEMI) myocardial infarction: Secondary | ICD-10-CM

## 2020-09-27 NOTE — Progress Notes (Signed)
Daily Session Note  Patient Details  Name: James Wolf MRN: [id number] Date of Birth: 07-28-1950 Referring Provider:   Flowsheet Row CARDIAC REHAB PHASE II ORIENTATION from 08/23/2020 in [place]  Referring Provider Dr. Roxy Manns       Encounter Date: 09/27/2020  Check In:  Session Check In - 09/27/20 1116       Check-In   Supervising physician immediately available to respond to emergencies [facility] MD immediately available    Physician(s) Dr. Harl Bowie    Location [facility]    Staff Present Redge Gainer, BS, Exercise Physiologist;Debra Wynetta Emery, RN, Bjorn Loser, MS, ACSM-CEP, Exercise Physiologist    Virtual Visit No    Medication changes reported     No    Fall or balance concerns reported    No    Tobacco Cessation No Change    Warm-up and Cool-down Performed as group-led instruction    Resistance Training Performed Yes    VAD Patient? No    PAD/SET Patient? No      Pain Assessment   Currently in Pain? No/denies    Pain Score 0-No pain    Multiple Pain Sites No             Capillary Blood Glucose: No results found for this or any previous visit (from the past 24 hour(s)).    Social History   Tobacco Use  Smoking Status Former   Packs/day: 1.00   Years: 25.00   Pack years: 25.00   Types: Cigarettes   Quit date: 02/19/1993   Years since quitting: 27.6  Smokeless Tobacco Never    Goals Met:  Independence with exercise equipment Exercise tolerated well No report of concerns or symptoms today Strength training completed today  Goals Unmet:  Not Applicable  Comments: check out 1200   Dr. Kathie Dike is Medical Director for [facility] Pul[facility].

## 2020-09-29 ENCOUNTER — Encounter (HOSPITAL_COMMUNITY)
Admission: RE | Admit: 2020-09-29 | Discharge: 2020-09-29 | Disposition: A | Discharge status: Home / Self Care | Payer: Medicare Other | Source: Ambulatory Visit | Attending: Cardiology | Admitting: Cardiology

## 2020-09-29 ENCOUNTER — Other Ambulatory Visit: Payer: Self-pay | Admitting: *Deleted

## 2020-09-29 ENCOUNTER — Other Ambulatory Visit: Payer: Self-pay

## 2020-09-29 ENCOUNTER — Telehealth: Payer: Self-pay | Admitting: Cardiology

## 2020-09-29 DIAGNOSIS — Z951 Presence of aortocoronary bypass graft: Secondary | ICD-10-CM | POA: Diagnosis not present

## 2020-09-29 DIAGNOSIS — I214 Non-ST elevation (NSTEMI) myocardial infarction: Secondary | ICD-10-CM

## 2020-09-29 MED ORDER — ROSUVASTATIN CALCIUM 40 MG PO TABS: 40.0000 mg | ORAL_TABLET | Freq: Every day | ORAL | 3 refills | Status: DC

## 2020-09-29 NOTE — Progress Notes (Signed)
Daily Session Note  Patient Details  Name: James Wolf MRN: [id number] Date of Birth: September 12, 1950 Referring Provider:   Flowsheet Row CARDIAC REHAB PHASE II ORIENTATION from 08/23/2020 in [place]  Referring Provider Dr. Roxy Manns       Encounter Date: 09/29/2020  Check In:  Session Check In - 09/29/20 1100       Check-In   Supervising physician immediately available to respond to emergencies [facility] MD immediately available    Physician(s) Dr. Harl Bowie    Location [facility]    Staff Present Hoy Register, MS, ACSM-CEP, Exercise Physiologist;Burle Kwan Zigmund Daniel, Exercise Physiologist;Debra Wynetta Emery, RN, BSN    Virtual Visit No    Medication changes reported     No    Fall or balance concerns reported    No    Tobacco Cessation No Change    Warm-up and Cool-down Performed as group-led instruction    Resistance Training Performed Yes    VAD Patient? No    PAD/SET Patient? No      Pain Assessment   Currently in Pain? No/denies    Pain Score 0-No pain    Multiple Pain Sites No             Capillary Blood Glucose: No results found for this or any previous visit (from the past 24 hour(s)).    Social History   Tobacco Use  Smoking Status Former   Packs/day: 1.00   Years: 25.00   Pack years: 25.00   Types: Cigarettes   Quit date: 02/19/1993   Years since quitting: 27.6  Smokeless Tobacco Never    Goals Met:  Independence with exercise equipment Exercise tolerated well No report of concerns or symptoms today Strength training completed today  Goals Unmet:  Not Applicable  Comments: check out 1200   Dr. Kathie Dike is Medical Director for [facility] Pul[facility] Re[facility].

## 2020-09-29 NOTE — Telephone Encounter (Signed)
   1. Which medications need to be refilled? (please list name of each medication and dose if known)  ROSUVASTATIN 40 MG  2. Which pharmacy/location (including street and city if local pharmacy) is medication to be sent to?[facility].  [place], [place]   3. Do they need a 30 day or 90 day supply? [place]

## 2020-10-02 ENCOUNTER — Encounter (HOSPITAL_COMMUNITY): Payer: Medicare Other

## 2020-10-03 ENCOUNTER — Ambulatory Visit (INDEPENDENT_AMBULATORY_CARE_PROVIDER_SITE_OTHER): Payer: Medicare Other | Admitting: Cardiology

## 2020-10-03 ENCOUNTER — Encounter: Payer: Self-pay | Admitting: Cardiology

## 2020-10-03 VITALS — BP 110/60 | HR 61 | Ht 65.0 in | Wt 167.8 lb

## 2020-10-03 DIAGNOSIS — I2510 Atherosclerotic heart disease of native coronary artery without angina pectoris: Secondary | ICD-10-CM | POA: Diagnosis not present

## 2020-10-03 DIAGNOSIS — I6523 Occlusion and stenosis of bilateral carotid arteries: Secondary | ICD-10-CM

## 2020-10-03 DIAGNOSIS — E785 Hyperlipidemia, unspecified: Secondary | ICD-10-CM | POA: Diagnosis not present

## 2020-10-03 NOTE — Patient Instructions (Addendum)
Medication Instructions:  Continue all current medications.   Labwork: FLP - order given today.  Reminder:  Nothing to eat or drink after 12 midnight prior to labs. Office will contact with results via phone or letter.    Testing/Procedures: Your physician has requested that you have a carotid duplex. This test is an ultrasound of the carotid arteries in your neck. It looks at blood flow through these arteries that supply the brain with blood. Allow one hour for this exam. There are no restrictions or special instructions - DUE November   Follow-Up: 6-7  months   Any Other Special Instructions Will Be Listed Below (If Applicable).   If you need a refill on your cardiac medications before your next appointment, please call your pharmacy.

## 2020-10-03 NOTE — Progress Notes (Signed)
Clinical Summary Mr. Rodin is a 70 y.o.male seen today for follow up of the following medical problems. Last seen by PA Leonides Sake, this is our first visit together.   1.CAD - admit 05/2020 with NSTEMI - cardiac catheterization by Dr. Martinique with critical left main and multivessel CAD. He underwent CABG x3 (LIMA-LAD, SVG-OM, SVG-distal RCA)  - no recent chest pain. No SOB/DOE - compliant with meds  2. HTN - compliant with meds    3. Hyperlipidemia - compliant with meds   4. Carotid stenosis - 05/2020 RICA 60-79%, mild RICA   5. 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      Current Outpatient Medications  Medication Sig Dispense Refill   albuterol (VENTOLIN HFA) 108 (90 Base) MCG/ACT inhaler Inhale 1-2 puffs into the lungs every 6 (six) hours as needed for wheezing or shortness of breath.     aspirin EC 81 MG EC tablet Take 1 tablet (81 mg total) by mouth daily. Swallow whole. 30 tablet 11   B Complex Vitamins (VITAMIN B COMPLEX PO) Take 1 tablet by mouth daily.      cholecalciferol (VITAMIN D3) 25 MCG (1000 UNIT) tablet Take 1,000 Units by mouth daily.     clopidogrel (PLAVIX) 75 MG tablet Take 1 tablet (75 mg total) by mouth daily. 30 tablet 11   diclofenac sodium (VOLTAREN) 1 % GEL Apply 2 g topically 2 (two) times daily as needed (pain).      esomeprazole (NEXIUM) 40 MG  capsule Take 40 mg by mouth 2 (two) times daily.      ezetimibe (ZETIA) 10 MG tablet Take 1 tablet (10 mg total) by mouth daily. 90 tablet 1   metoprolol tartrate (LOPRESSOR) 100 MG tablet Take 1 tablet (100 mg total) by mouth 2 (two) times daily. (Patient taking differently: Take 50 mg by mouth 2 (two) times daily.) 60 tablet 3   rosuvastatin (CRESTOR) 40 MG tablet Take 1 tablet (40 mg total) by mouth daily at 6 PM. 30 tablet 3   No current facility-administered medications for this visit.     Past Surgical History:  Procedure Laterality Date   CORONARY ARTERY BYPASS GRAFT N/A 06/02/2020   Procedure: CORONARY ARTERY BYPASS GRAFTING (CABG) TIMES THREE USING RIGHT GREATER SAPHENOUS VEIN HARVESTED ENDOSCOPICALLY AND LEFT INTERNAL MAMMARY ARTERY.;  Surgeon: Rexene Alberts, MD;  Location: [place];  Service: Open Heart Surgery;  Laterality: N/A;   CYSTOSCOPY/RETROGRADE/URETEROSCOPY/STONE EXTRACTION WITH BASKET     ESOPHAGECTOMY     Ivor-Lewis esophagectomy 1995 [facility]   IR THORACENTESIS ASP PLEURAL SPACE W/IMG GUIDE  06/06/2020   LEFT HEART CATH AND CORONARY ANGIOGRAPHY N/A 05/30/2020   Procedure: LEFT HEART CATH AND CORONARY ANGIOGRAPHY;  Surgeon:  Martinique, Peter M, MD;  Location: [place] [facility];  Service: Cardiovascular;  Laterality: N/A;   partial esophageal ejectory     SHOULDER OPEN ROTATOR CUFF REPAIR Left 10/26/2019   Procedure: ROTATOR CUFF REPAIR SHOULDER OPEN LEFT;  Surgeon: [name], MD;  Location: [facility];  Service: Orthopedics;  Laterality: Left;     Allergies  Allergen Reactions   Zithromax [Azithromycin] Diarrhea    Causes pt's stomach to "tear up" does not want to take medication again.       Family History  Problem Relation Age of Onset   CAD Father    CVA Father      Social History Mr. Dupras reports that he quit smoking about 27 years ago. His smoking use included cigarettes. He has a 25.00 pack-year smoking history. He has never used smokeless  tobacco. Mr. Tiegs reports that he does not currently use alcohol.   Review of Systems CONSTITUTIONAL: No weight loss, fever, chills, weakness or fatigue.  HEENT: Eyes: No visual loss, blurred vision, double vision or yellow sclerae.No hearing loss, sneezing, congestion, runny nose or sore throat.  SKIN: No rash or itching.  CARDIOVASCULAR: per hpi RESPIRATORY: No shortness of breath, cough or sputum.  GASTROINTESTINAL: No anorexia, nausea, vomiting or diarrhea. No abdominal pain or blood.  GENITOURINARY: No burning on urination, no polyuria NEUROLOGICAL: No headache, dizziness, syncope, paralysis, ataxia, numbness or tingling in the extremities. No change in bowel or bladder control.  MUSCULOSKELETAL: No muscle, back pain, joint pain or stiffness.  LYMPHATICS: No enlarged nodes. No history of splenectomy.  PSYCHIATRIC: No history of depression or anxiety.  ENDOCRINOLOGIC: No reports of sweating, cold or heat intolerance. No polyuria or polydipsia.  Marland Kitchen   Physical Examination Today's Vitals   10/03/20 1139  BP: 110/60  Pulse: 61  SpO2: 96%  Weight: 167 lb 12.8 oz (76.1 kg)  Height: 5\' 5"  (1.651 m)   Body mass index is 27.92 kg/m.  Gen: resting comfortably, no acute distress HEENT: no scleral icterus, pupils equal round and reactive, no palptable cervical adenopathy,  CV: RRR, no m/r/g no jvd Resp: Clear to auscultation bilaterally GI: abdomen is soft, non-tender, non-distended, normal bowel sounds, no hepatosplenomegaly MSK: extremities are warm, no edema.  Skin: warm, no rash Neuro:  no focal deficits Psych: appropriate affect     Assessment and Plan  CAD - no recent symptoms, continue current meds - continue DAPT 1 year in setting of NSTEMI managed with CABG.   2. Carotid stenosis - repeat carotid US in 11/2020  3. Hyperlipidemia - continue crestor and zetia, repeat lipid panel      Arnoldo Lenis, M.D.

## 2020-10-04 ENCOUNTER — Other Ambulatory Visit: Payer: Self-pay

## 2020-10-04 ENCOUNTER — Encounter (HOSPITAL_COMMUNITY)
Admission: RE | Admit: 2020-10-04 | Discharge: 2020-10-04 | Disposition: A | Discharge status: Home / Self Care | Payer: Medicare Other | Source: Ambulatory Visit | Attending: Cardiology | Admitting: Cardiology

## 2020-10-04 DIAGNOSIS — I214 Non-ST elevation (NSTEMI) myocardial infarction: Secondary | ICD-10-CM

## 2020-10-04 DIAGNOSIS — Z951 Presence of aortocoronary bypass graft: Secondary | ICD-10-CM

## 2020-10-04 NOTE — Progress Notes (Signed)
Daily Session Note  Patient Details  Name: James Wolf MRN: [id number] Date of Birth: 1950/05/04 Referring Provider:   Flowsheet Row CARDIAC REHAB PHASE II ORIENTATION from 08/23/2020 in [place]  Referring Provider Dr. Roxy Manns       Encounter Date: 10/04/2020  Check In:  Session Check In - 10/04/20 1100       Check-In   Supervising physician immediately available to respond to emergencies [facility] MD immediately available    Physician(s) Dr. Domenic Polite    Location [facility]    Staff Present Hoy Register, MS, ACSM-CEP, Exercise Physiologist;Carlette Wilber Oliphant, RN, BSN;Heather Otho Ket, BS, Exercise Physiologist    Virtual Visit No    Medication changes reported     No    Fall or balance concerns reported    No    Tobacco Cessation No Change    Warm-up and Cool-down Performed as group-led instruction    Resistance Training Performed Yes    VAD Patient? No    PAD/SET Patient? No      Pain Assessment   Currently in Pain? No/denies    Pain Score 0-No pain    Multiple Pain Sites No             Capillary Blood Glucose: No results found for this or any previous visit (from the past 24 hour(s)).    Social History   Tobacco Use  Smoking Status Former   Packs/day: 1.00   Years: 25.00   Pack years: 25.00   Types: Cigarettes   Quit date: 02/19/1993   Years since quitting: 27.6  Smokeless Tobacco Never    Goals Met:  Independence with exercise equipment Exercise tolerated well No report of concerns or symptoms today Strength training completed today  Goals Unmet:  Not Applicable  Comments: checkout time is 1200   Dr. Kathie Dike is Medical Director for [facility].

## 2020-10-06 ENCOUNTER — Other Ambulatory Visit: Payer: Self-pay

## 2020-10-06 ENCOUNTER — Encounter (HOSPITAL_COMMUNITY)
Admission: RE | Admit: 2020-10-06 | Discharge: 2020-10-06 | Disposition: A | Discharge status: Home / Self Care | Payer: Medicare Other | Source: Ambulatory Visit | Attending: Cardiology | Admitting: Cardiology

## 2020-10-06 DIAGNOSIS — Z951 Presence of aortocoronary bypass graft: Secondary | ICD-10-CM | POA: Diagnosis not present

## 2020-10-06 DIAGNOSIS — I214 Non-ST elevation (NSTEMI) myocardial infarction: Secondary | ICD-10-CM

## 2020-10-06 NOTE — Progress Notes (Signed)
Daily Session Note  Patient Details  Name: James Wolf MRN: [id number] Date of Birth: 1950/12/28 Referring Provider:   Flowsheet Row CARDIAC REHAB PHASE II ORIENTATION from 08/23/2020 in [place]  Referring Provider Dr. Roxy Manns       Encounter Date: 10/06/2020  Check In:  Session Check In - 10/06/20 1100       Check-In   Supervising physician immediately available to respond to emergencies [facility] MD immediately available    Physician(s) Dr. Johnsie Cancel    Location [facility]    Staff Present Hoy Register, MS, ACSM-CEP, Exercise Physiologist;Debra Wynetta Emery, RN, BSN;Heather Otho Ket, BS, Exercise Physiologist    Virtual Visit No    Medication changes reported     No    Fall or balance concerns reported    No    Tobacco Cessation No Change    Warm-up and Cool-down Performed as group-led instruction    Resistance Training Performed Yes    VAD Patient? No    PAD/SET Patient? No      Pain Assessment   Currently in Pain? No/denies    Pain Score 0-No pain    Multiple Pain Sites No             Capillary Blood Glucose: No results found for this or any previous visit (from the past 24 hour(s)).    Social History   Tobacco Use  Smoking Status Former   Packs/day: 1.00   Years: 25.00   Pack years: 25.00   Types: Cigarettes   Quit date: 02/19/1993   Years since quitting: 27.6  Smokeless Tobacco Never    Goals Met:  Independence with exercise equipment Exercise tolerated well No report of concerns or symptoms today Strength training completed today  Goals Unmet:  Not Applicable  Comments: checkout time is 1200   Dr. Kathie Dike is Medical Director for [facility].

## 2020-10-09 ENCOUNTER — Other Ambulatory Visit: Payer: Self-pay

## 2020-10-09 ENCOUNTER — Ambulatory Visit: Payer: Medicare Other | Admitting: Cardiology

## 2020-10-09 ENCOUNTER — Encounter (HOSPITAL_COMMUNITY)
Admission: RE | Admit: 2020-10-09 | Discharge: 2020-10-09 | Disposition: A | Discharge status: Home / Self Care | Payer: Medicare Other | Source: Ambulatory Visit | Attending: Cardiology | Admitting: Cardiology

## 2020-10-09 DIAGNOSIS — I214 Non-ST elevation (NSTEMI) myocardial infarction: Secondary | ICD-10-CM | POA: Insufficient documentation

## 2020-10-09 DIAGNOSIS — Z951 Presence of aortocoronary bypass graft: Secondary | ICD-10-CM | POA: Diagnosis not present

## 2020-10-09 NOTE — Progress Notes (Signed)
Daily Session Note  Patient Details  Name: James Wolf MRN: [id number] Date of Birth: 18-Jun-1950 Referring Provider:   Flowsheet Row CARDIAC REHAB PHASE II ORIENTATION from 08/23/2020 in [place]  Referring Provider Dr. Roxy Manns       Encounter Date: 10/09/2020  Check In:  Session Check In - 10/09/20 1100       Check-In   Supervising physician immediately available to respond to emergencies [facility] MD immediately available    Physician(s) Dr. Domenic Polite    Location [facility]    Staff Present Hoy Register, MS, ACSM-CEP, Exercise Physiologist;Debra Wynetta Emery, RN, BSN    Virtual Visit No    Medication changes reported     No    Fall or balance concerns reported    No    Tobacco Cessation No Change    Warm-up and Cool-down Performed as group-led instruction    Resistance Training Performed Yes    VAD Patient? No    PAD/SET Patient? No      Pain Assessment   Currently in Pain? No/denies    Pain Score 0-No pain    Multiple Pain Sites No             Capillary Blood Glucose: No results found for this or any previous visit (from the past 24 hour(s)).    Social History   Tobacco Use  Smoking Status Former   Packs/day: 1.00   Years: 25.00   Pack years: 25.00   Types: Cigarettes   Quit date: 02/19/1993   Years since quitting: 27.6  Smokeless Tobacco Never    Goals Met:  Independence with exercise equipment Exercise tolerated well No report of concerns or symptoms today Strength training completed today  Goals Unmet:  Not Applicable  Comments: checkout time is 1200   Dr. Kathie Dike is Medical Director for [facility] Pul[facility].

## 2020-10-11 ENCOUNTER — Other Ambulatory Visit: Payer: Self-pay

## 2020-10-11 ENCOUNTER — Encounter (HOSPITAL_COMMUNITY)
Admission: RE | Admit: 2020-10-11 | Discharge: 2020-10-11 | Disposition: A | Discharge status: Home / Self Care | Payer: Medicare Other | Source: Ambulatory Visit | Attending: Cardiology | Admitting: Cardiology

## 2020-10-11 DIAGNOSIS — Z951 Presence of aortocoronary bypass graft: Secondary | ICD-10-CM

## 2020-10-11 DIAGNOSIS — I214 Non-ST elevation (NSTEMI) myocardial infarction: Secondary | ICD-10-CM

## 2020-10-11 NOTE — Progress Notes (Signed)
Daily Session Note  Patient Details  Name: James Wolf MRN: [id number] Date of Birth: 03/21/1950 Referring Provider:   Flowsheet Row CARDIAC REHAB PHASE II ORIENTATION from 08/23/2020 in [place]  Referring Provider Dr. Roxy Manns       Encounter Date: 10/11/2020  Check In:  Session Check In - 10/11/20 1100       Check-In   Supervising physician immediately available to respond to emergencies [facility] MD immediately available    Physician(s) Dr. Domenic Polite    Location [facility]    Staff Present Redge Gainer, BS, Exercise Physiologist;Debra Wynetta Emery, RN, BSN    Virtual Visit No    Medication changes reported     No    Fall or balance concerns reported    No    Tobacco Cessation No Change    Warm-up and Cool-down Performed as group-led instruction    Resistance Training Performed Yes    VAD Patient? No    PAD/SET Patient? No      Pain Assessment   Currently in Pain? No/denies    Pain Score 0-No pain    Multiple Pain Sites No             Capillary Blood Glucose: No results found for this or any previous visit (from the past 24 hour(s)).    Social History   Tobacco Use  Smoking Status Former   Packs/day: 1.00   Years: 25.00   Pack years: 25.00   Types: Cigarettes   Quit date: 02/19/1993   Years since quitting: 27.6  Smokeless Tobacco Never    Goals Met:  Independence with exercise equipment Exercise tolerated well No report of concerns or symptoms today Strength training completed today  Goals Unmet:  Not Applicable  Comments: check out 1200   Dr. Kathie Dike is Medical Director for [facility] Pulmonary Re[facility].

## 2020-10-12 ENCOUNTER — Other Ambulatory Visit (HOSPITAL_COMMUNITY)
Admission: RE | Admit: 2020-10-12 | Discharge: 2020-10-12 | Disposition: A | Discharge status: Home / Self Care | Payer: Medicare Other | Source: Ambulatory Visit | Attending: Cardiology | Admitting: Cardiology

## 2020-10-12 DIAGNOSIS — E785 Hyperlipidemia, unspecified: Secondary | ICD-10-CM

## 2020-10-12 DIAGNOSIS — I6523 Occlusion and stenosis of bilateral carotid arteries: Secondary | ICD-10-CM

## 2020-10-12 LAB — LIPID PANEL
Cholesterol: 78 mg/dL (ref 0–200)
HDL: 46 mg/dL (ref >40)
LDL Cholesterol: 21 mg/dL (ref 0–99)
Total CHOL/HDL Ratio: 1.7 RATIO
Triglycerides: 55 mg/dL (ref <150)
VLDL: 11 mg/dL (ref 0–40)

## 2020-10-13 ENCOUNTER — Other Ambulatory Visit: Payer: Self-pay

## 2020-10-13 ENCOUNTER — Encounter (HOSPITAL_COMMUNITY)
Admission: RE | Admit: 2020-10-13 | Discharge: 2020-10-13 | Disposition: A | Discharge status: Home / Self Care | Payer: Medicare Other | Source: Ambulatory Visit | Attending: Cardiology | Admitting: Cardiology

## 2020-10-13 DIAGNOSIS — Z951 Presence of aortocoronary bypass graft: Secondary | ICD-10-CM

## 2020-10-13 DIAGNOSIS — I214 Non-ST elevation (NSTEMI) myocardial infarction: Secondary | ICD-10-CM

## 2020-10-13 NOTE — Progress Notes (Signed)
Daily Session Note  Patient Details  Name: James Wolf MRN: [id number] Date of Birth: 1950-04-03 Referring Provider:   Flowsheet Row CARDIAC REHAB PHASE II ORIENTATION from 08/23/2020 in [place]  Referring Provider Dr. Roxy Manns       Encounter Date: 10/13/2020  Check In:  Session Check In - 10/13/20 1100       Check-In   Supervising physician immediately available to respond to emergencies [facility] MD immediately available    Physician(s) Dr. Harl Bowie    Location [facility]    Staff Present Redge Gainer, BS, Exercise Physiologist;Debra Wynetta Emery, RN, BSN    Virtual Visit No    Medication changes reported     No    Fall or balance concerns reported    No    Tobacco Cessation No Change    Warm-up and Cool-down Performed as group-led instruction    Resistance Training Performed Yes    VAD Patient? No    PAD/SET Patient? No      Pain Assessment   Currently in Pain? No/denies    Pain Score 0-No pain    Multiple Pain Sites No             Capillary Blood Glucose: No results found for this or any previous visit (from the past 24 hour(s)).    Social History   Tobacco Use  Smoking Status Former   Packs/day: 1.00   Years: 25.00   Pack years: 25.00   Types: Cigarettes   Quit date: 02/19/1993   Years since quitting: 27.6  Smokeless Tobacco Never    Goals Met:  Independence with exercise equipment Exercise tolerated well No report of concerns or symptoms today Strength training completed today  Goals Unmet:  Not Applicable  Comments: check out 1200   Dr. Kathie Dike is Medical Director for [facility].

## 2020-10-14 ENCOUNTER — Emergency Department (HOSPITAL_COMMUNITY): Payer: Medicare Other

## 2020-10-14 ENCOUNTER — Encounter (HOSPITAL_COMMUNITY): Payer: Self-pay | Admitting: *Deleted

## 2020-10-14 ENCOUNTER — Inpatient Hospital Stay (HOSPITAL_COMMUNITY)
Admission: EM | Admit: 2020-10-14 | Discharge: 2020-10-18 | DRG: 177 | Disposition: A | Discharge status: Home / Self Care | Payer: Medicare Other | Attending: Family Medicine | Admitting: Family Medicine

## 2020-10-14 ENCOUNTER — Other Ambulatory Visit: Payer: Self-pay

## 2020-10-14 DIAGNOSIS — I129 Hypertensive chronic kidney disease with stage 1 through stage 4 chronic kidney disease, or unspecified chronic kidney disease: Secondary | ICD-10-CM | POA: Diagnosis present

## 2020-10-14 DIAGNOSIS — N1831 Chronic kidney disease, stage 3a: Secondary | ICD-10-CM | POA: Diagnosis present

## 2020-10-14 DIAGNOSIS — I10 Essential (primary) hypertension: Secondary | ICD-10-CM | POA: Diagnosis present

## 2020-10-14 DIAGNOSIS — Z8249 Family history of ischemic heart disease and other diseases of the circulatory system: Secondary | ICD-10-CM

## 2020-10-14 DIAGNOSIS — Z79899 Other long term (current) drug therapy: Secondary | ICD-10-CM

## 2020-10-14 DIAGNOSIS — Z7902 Long term (current) use of antithrombotics/antiplatelets: Secondary | ICD-10-CM

## 2020-10-14 DIAGNOSIS — J449 Chronic obstructive pulmonary disease, unspecified: Secondary | ICD-10-CM | POA: Diagnosis present

## 2020-10-14 DIAGNOSIS — Z951 Presence of aortocoronary bypass graft: Secondary | ICD-10-CM

## 2020-10-14 DIAGNOSIS — Z87891 Personal history of nicotine dependence: Secondary | ICD-10-CM

## 2020-10-14 DIAGNOSIS — Z823 Family history of stroke: Secondary | ICD-10-CM

## 2020-10-14 DIAGNOSIS — K219 Gastro-esophageal reflux disease without esophagitis: Secondary | ICD-10-CM | POA: Diagnosis present

## 2020-10-14 DIAGNOSIS — Z87442 Personal history of urinary calculi: Secondary | ICD-10-CM

## 2020-10-14 DIAGNOSIS — E785 Hyperlipidemia, unspecified: Secondary | ICD-10-CM | POA: Diagnosis present

## 2020-10-14 DIAGNOSIS — Z7982 Long term (current) use of aspirin: Secondary | ICD-10-CM

## 2020-10-14 DIAGNOSIS — I6521 Occlusion and stenosis of right carotid artery: Secondary | ICD-10-CM | POA: Diagnosis present

## 2020-10-14 DIAGNOSIS — J690 Pneumonitis due to inhalation of food and vomit: Principal | ICD-10-CM | POA: Diagnosis present

## 2020-10-14 DIAGNOSIS — I2510 Atherosclerotic heart disease of native coronary artery without angina pectoris: Secondary | ICD-10-CM | POA: Diagnosis present

## 2020-10-14 DIAGNOSIS — Z20822 Contact with and (suspected) exposure to covid-19: Secondary | ICD-10-CM | POA: Diagnosis present

## 2020-10-14 DIAGNOSIS — I25110 Atherosclerotic heart disease of native coronary artery with unstable angina pectoris: Secondary | ICD-10-CM | POA: Diagnosis not present

## 2020-10-14 DIAGNOSIS — D631 Anemia in chronic kidney disease: Secondary | ICD-10-CM | POA: Diagnosis present

## 2020-10-14 DIAGNOSIS — M810 Age-related osteoporosis without current pathological fracture: Secondary | ICD-10-CM | POA: Diagnosis present

## 2020-10-14 DIAGNOSIS — D6959 Other secondary thrombocytopenia: Secondary | ICD-10-CM | POA: Diagnosis present

## 2020-10-14 DIAGNOSIS — Z881 Allergy status to other antibiotic agents status: Secondary | ICD-10-CM

## 2020-10-14 DIAGNOSIS — Z8701 Personal history of pneumonia (recurrent): Secondary | ICD-10-CM

## 2020-10-14 DIAGNOSIS — R0602 Shortness of breath: Secondary | ICD-10-CM

## 2020-10-14 DIAGNOSIS — J9601 Acute respiratory failure with hypoxia: Secondary | ICD-10-CM | POA: Diagnosis present

## 2020-10-14 DIAGNOSIS — R911 Solitary pulmonary nodule: Secondary | ICD-10-CM | POA: Diagnosis present

## 2020-10-14 DIAGNOSIS — R7302 Impaired glucose tolerance (oral): Secondary | ICD-10-CM | POA: Diagnosis present

## 2020-10-14 DIAGNOSIS — E782 Mixed hyperlipidemia: Secondary | ICD-10-CM | POA: Diagnosis present

## 2020-10-14 DIAGNOSIS — I21A1 Myocardial infarction type 2: Secondary | ICD-10-CM | POA: Diagnosis present

## 2020-10-14 DIAGNOSIS — Z8501 Personal history of malignant neoplasm of esophagus: Secondary | ICD-10-CM

## 2020-10-14 DIAGNOSIS — I252 Old myocardial infarction: Secondary | ICD-10-CM

## 2020-10-14 HISTORY — DX: Chronic kidney disease, stage 3a: N18.31

## 2020-10-14 HISTORY — DX: Disorder of arteries and arterioles, unspecified: I77.9

## 2020-10-14 HISTORY — DX: Solitary pulmonary nodule: R91.1

## 2020-10-14 HISTORY — DX: Atherosclerotic heart disease of native coronary artery without angina pectoris: I25.10

## 2020-10-14 LAB — CBC WITH DIFFERENTIAL/PLATELET
Abs Immature Granulocytes: 0.02 10*3/uL (ref 0.00–0.07)
Basophils Absolute: 0 10*3/uL (ref 0.0–0.1)
Basophils Relative: 0 %
Eosinophils Absolute: 0 10*3/uL (ref 0.0–0.5)
Eosinophils Relative: 0 %
HCT: 38.4 % — ABNORMAL LOW (ref 39.0–52.0)
Hemoglobin: 12.2 g/dL — ABNORMAL LOW (ref 13.0–17.0)
Immature Granulocytes: 0 %
Lymphocytes Relative: 5 %
Lymphs Abs: 0.4 10*3/uL — ABNORMAL LOW (ref 0.7–4.0)
MCH: 27.8 pg (ref 26.0–34.0)
MCHC: 31.8 g/dL (ref 30.0–36.0)
MCV: 87.5 fL (ref 80.0–100.0)
Monocytes Absolute: 0.6 10*3/uL (ref 0.1–1.0)
Monocytes Relative: 6 %
Neutro Abs: 8.4 10*3/uL — ABNORMAL HIGH (ref 1.7–7.7)
Neutrophils Relative %: 89 %
Platelets: 182 10*3/uL (ref 150–400)
RBC: 4.39 MIL/uL (ref 4.22–5.81)
RDW: 16.6 % — ABNORMAL HIGH (ref 11.5–15.5)
WBC: 9.4 10*3/uL (ref 4.0–10.5)
nRBC: 0 % (ref 0.0–0.2)

## 2020-10-14 LAB — COMPREHENSIVE METABOLIC PANEL
ALT: 44 U/L (ref 0–44)
AST: 53 U/L — ABNORMAL HIGH (ref 15–41)
Albumin: 3.7 g/dL (ref 3.5–5.0)
Alkaline Phosphatase: 47 U/L (ref 38–126)
Anion gap: 7 (ref 5–15)
BUN: 14 mg/dL (ref 8–23)
CO2: 25 mmol/L (ref 22–32)
Calcium: 8.6 mg/dL — ABNORMAL LOW (ref 8.9–10.3)
Chloride: 106 mmol/L (ref 98–111)
Creatinine, Ser: 1.27 mg/dL — ABNORMAL HIGH (ref 0.61–1.24)
GFR, Estimated: >60 mL/min (ref >60)
Glucose, Bld: 174 mg/dL — ABNORMAL HIGH (ref 70–99)
Potassium: 3.8 mmol/L (ref 3.5–5.1)
Sodium: 138 mmol/L (ref 135–145)
Total Bilirubin: 0.5 mg/dL (ref 0.3–1.2)
Total Protein: 7.3 g/dL (ref 6.5–8.1)

## 2020-10-14 LAB — BRAIN NATRIURETIC PEPTIDE: B Natriuretic Peptide: 166 pg/mL — ABNORMAL HIGH (ref 0.0–100.0)

## 2020-10-14 LAB — TROPONIN I (HIGH SENSITIVITY)
Troponin I (High Sensitivity): 5 ng/L (ref <18)
Troponin I (High Sensitivity): 6 ng/L (ref <18)

## 2020-10-14 LAB — PROCALCITONIN: Procalcitonin: 5.24 ng/mL

## 2020-10-14 LAB — RESP PANEL BY RT-PCR (FLU A&B, COVID) ARPGX2
Influenza A by PCR: NEGATIVE
Influenza B by PCR: NEGATIVE
SARS Coronavirus 2 by RT PCR: NEGATIVE

## 2020-10-14 LAB — D-DIMER, QUANTITATIVE: D-Dimer, Quant: 2.07 ug/mL-FEU — ABNORMAL HIGH (ref 0.00–0.50)

## 2020-10-14 LAB — LACTIC ACID, PLASMA: Lactic Acid, Venous: 1.8 mmol/L (ref 0.5–1.9)

## 2020-10-14 IMAGING — DX DG CHEST 1V PORT
1 series · 1 of 1 positions shown · non-contrast
Comparison: Chest radiograph [DATE] and earlier. CT [DATE].

CLINICAL DATA: 70-year-old male with weakness, shortness of breath
and nausea vomiting since [M2] hours. Query aspiration.

EXAM:
PORTABLE CHEST 1 VIEW

[chest ap]
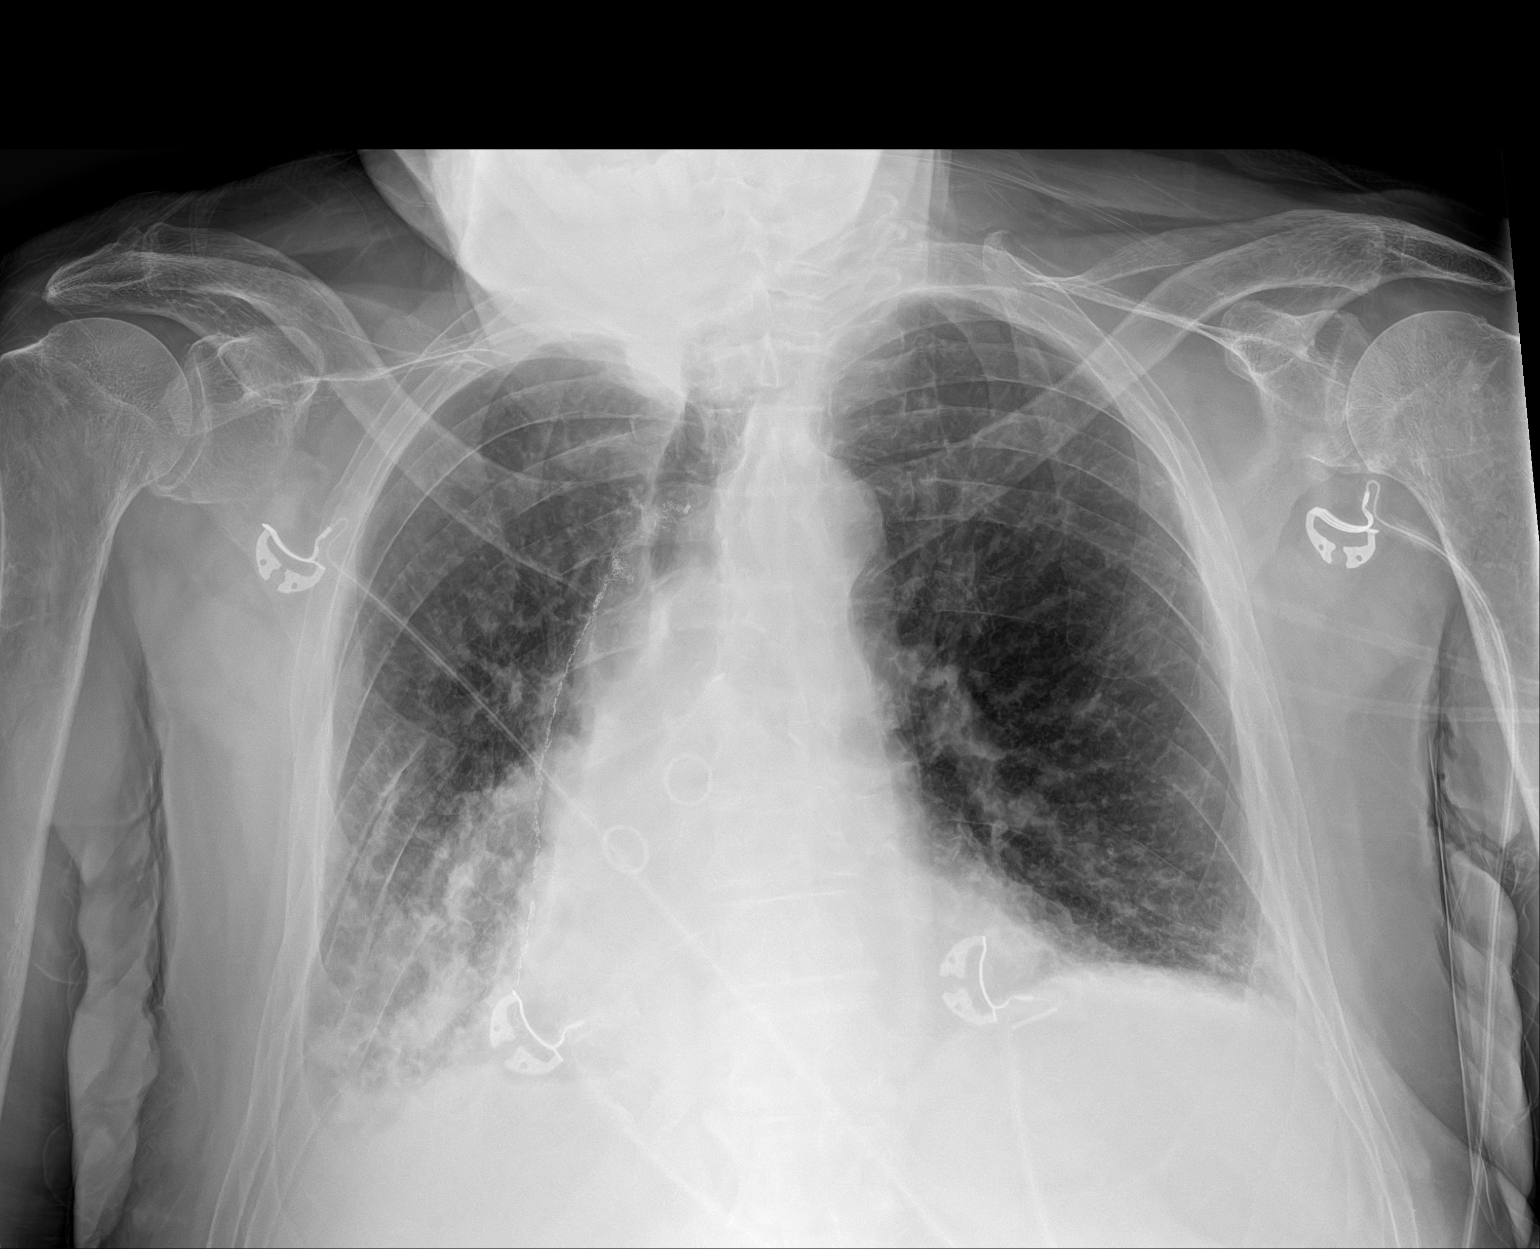

[1 of 1 positions shown; findings below may reference images not displayed]

FINDINGS: Portable AP upright view at [M2] hours. Prior CT demonstrates
sequelae of esophagectomy and gastric pull-through. Superimposed
CABG changes also noted. Chronic blunting of the right costophrenic
angle, but increased patchy opacity there compared to [facility]
radiographs. No superimposed pneumothorax or pulmonary edema. Left
lung appears stable and relatively clear. Visualized tracheal air
column is within normal limits. No acute osseous abnormality
identified. Paucity of bowel gas in the upper abdomen.
IMPRESSION: Chronic blunting of the right lung base status post prior
esophagectomy and gastric pull-through, but there is increased
patchy opacity there since [REDACTED] suspicious for aspiration in this
setting.

## 2020-10-14 MED ORDER — SODIUM CHLORIDE 0.9 % IV BOLUS
1000.0000 mL | Freq: Once | INTRAVENOUS | Status: AC
  Administered 2020-10-14: 1000 mL via INTRAVENOUS

## 2020-10-14 MED ORDER — SODIUM CHLORIDE 0.9 % IV SOLN
3.0000 g | Freq: Once | INTRAVENOUS | Status: AC
  Administered 2020-10-14: 3 g via INTRAVENOUS
  Filled 2020-10-14: qty 8

## 2020-10-14 MED ORDER — SODIUM CHLORIDE 0.9 % IV SOLN: INTRAVENOUS | Status: DC | PRN

## 2020-10-14 MED ORDER — EZETIMIBE 10 MG PO TABS
10.0000 mg | ORAL_TABLET | Freq: Every day | ORAL | Status: DC
  Administered 2020-10-15 – 2020-10-18 (×4): 10 mg via ORAL
  Filled 2020-10-14 (×5): qty 1

## 2020-10-14 MED ORDER — SODIUM CHLORIDE 0.9 % IV SOLN
100.0000 mg | Freq: Two times a day (BID) | INTRAVENOUS | Status: DC
  Administered 2020-10-14 – 2020-10-18 (×7): 100 mg via INTRAVENOUS
  Filled 2020-10-14 (×9): qty 100

## 2020-10-14 MED ORDER — CLOPIDOGREL BISULFATE 75 MG PO TABS
75.0000 mg | ORAL_TABLET | Freq: Every day | ORAL | Status: DC
  Administered 2020-10-15 – 2020-10-18 (×4): 75 mg via ORAL
  Filled 2020-10-14 (×5): qty 1

## 2020-10-14 MED ORDER — IPRATROPIUM-ALBUTEROL 0.5-2.5 (3) MG/3ML IN SOLN
3.0000 mL | Freq: Three times a day (TID) | RESPIRATORY_TRACT | Status: DC
  Administered 2020-10-14 (×2): 3 mL via RESPIRATORY_TRACT
  Filled 2020-10-14: qty 3

## 2020-10-14 MED ORDER — VITAMIN D 25 MCG (1000 UNIT) PO TABS
1000.0000 [IU] | ORAL_TABLET | Freq: Every day | ORAL | Status: DC
  Administered 2020-10-15 – 2020-10-18 (×4): 1000 [IU] via ORAL
  Filled 2020-10-14 (×4): qty 1

## 2020-10-14 MED ORDER — PENTOXIFYLLINE ER 400 MG PO TBCR
400.0000 mg | EXTENDED_RELEASE_TABLET | Freq: Two times a day (BID) | ORAL | Status: DC
  Administered 2020-10-14 – 2020-10-15 (×3): 400 mg via ORAL
  Filled 2020-10-14 (×5): qty 1

## 2020-10-14 MED ORDER — ROSUVASTATIN CALCIUM 20 MG PO TABS
40.0000 mg | ORAL_TABLET | Freq: Every day | ORAL | Status: DC
  Administered 2020-10-14 – 2020-10-17 (×4): 40 mg via ORAL
  Filled 2020-10-14 (×4): qty 2

## 2020-10-14 MED ORDER — ENOXAPARIN SODIUM 40 MG/0.4ML IJ SOSY
40.0000 mg | PREFILLED_SYRINGE | INTRAMUSCULAR | Status: DC
  Administered 2020-10-14: 40 mg via SUBCUTANEOUS
  Filled 2020-10-14: qty 0.4

## 2020-10-14 MED ORDER — PANTOPRAZOLE SODIUM 40 MG PO TBEC
40.0000 mg | DELAYED_RELEASE_TABLET | Freq: Two times a day (BID) | ORAL | Status: DC
  Administered 2020-10-14 – 2020-10-18 (×9): 40 mg via ORAL
  Filled 2020-10-14 (×9): qty 1

## 2020-10-14 MED ORDER — IPRATROPIUM-ALBUTEROL 0.5-2.5 (3) MG/3ML IN SOLN
3.0000 mL | Freq: Once | RESPIRATORY_TRACT | Status: DC
  Filled 2020-10-14: qty 3

## 2020-10-14 MED ORDER — ASPIRIN EC 81 MG PO TBEC
81.0000 mg | DELAYED_RELEASE_TABLET | Freq: Every day | ORAL | Status: DC
  Administered 2020-10-15 – 2020-10-18 (×4): 81 mg via ORAL
  Filled 2020-10-14 (×5): qty 1

## 2020-10-14 MED ORDER — B COMPLEX-C PO TABS
1.0000 | ORAL_TABLET | Freq: Every day | ORAL | Status: DC
  Administered 2020-10-15 – 2020-10-18 (×4): 1 via ORAL
  Filled 2020-10-14 (×4): qty 1

## 2020-10-14 MED ORDER — PROCHLORPERAZINE EDISYLATE 10 MG/2ML IJ SOLN: 10.0000 mg | Freq: Four times a day (QID) | INTRAMUSCULAR | Status: DC | PRN

## 2020-10-14 MED ORDER — ACETAMINOPHEN 325 MG PO TABS
650.0000 mg | ORAL_TABLET | Freq: Four times a day (QID) | ORAL | Status: DC | PRN
  Administered 2020-10-14 – 2020-10-15 (×4): 650 mg via ORAL
  Filled 2020-10-14 (×5): qty 2

## 2020-10-14 MED ORDER — ACETAMINOPHEN 650 MG RE SUPP: 650.0000 mg | Freq: Four times a day (QID) | RECTAL | Status: DC | PRN

## 2020-10-14 MED ORDER — IPRATROPIUM-ALBUTEROL 0.5-2.5 (3) MG/3ML IN SOLN
3.0000 mL | Freq: Three times a day (TID) | RESPIRATORY_TRACT | Status: DC
  Administered 2020-10-15 – 2020-10-18 (×9): 3 mL via RESPIRATORY_TRACT
  Filled 2020-10-14 (×9): qty 3

## 2020-10-14 MED ORDER — POTASSIUM CHLORIDE IN NACL 20-0.9 MEQ/L-% IV SOLN: INTRAVENOUS | Status: DC

## 2020-10-14 MED ORDER — SODIUM CHLORIDE 0.9 % IV SOLN
3.0000 g | Freq: Four times a day (QID) | INTRAVENOUS | Status: DC
  Administered 2020-10-14 – 2020-10-15 (×4): 3 g via INTRAVENOUS
  Filled 2020-10-14: qty 8
  Filled 2020-10-14: qty 3
  Filled 2020-10-14 (×6): qty 8

## 2020-10-14 MED ORDER — METOPROLOL TARTRATE 25 MG PO TABS
25.0000 mg | ORAL_TABLET | Freq: Two times a day (BID) | ORAL | Status: DC
  Administered 2020-10-14 – 2020-10-18 (×8): 25 mg via ORAL
  Filled 2020-10-14 (×9): qty 1

## 2020-10-14 NOTE — Plan of Care (Signed)

## 2020-10-14 NOTE — ED Notes (Signed)
Gave lunch tray

## 2020-10-14 NOTE — ED Provider Notes (Signed)
[facility] EMERGENCY DEPARTMENT Provider Note  CSN: [id number] Arrival date & time: 10/14/20 1016    History Chief Complaint  Patient presents with   Nausea    James Wolf is a 70 y.o. male with history of COPD, CABG and remote partial esophagectomy for esophageal cancer who occasionally aspirates stomach contents. He reports this morning around 5am he felt nauseated, vomited. Felt like he aspirated and has been feeling more short of breath and clearing throat. Similar to previous episodes. Denies fever, no chest pain. Per EMS, SpO2 was low, improved with supplemental Oxygen which he does not typically wear at home.    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   LIMA to LAD, SVG to OM, SVG to RCA    Past Surgical History:  Procedure Laterality Date   CORONARY ARTERY BYPASS GRAFT N/A 06/02/2020   Procedure: CORONARY ARTERY BYPASS GRAFTING (CABG) TIMES THREE USING RIGHT GREATER SAPHENOUS VEIN HARVESTED ENDOSCOPICALLY AND LEFT INTERNAL MAMMARY ARTERY.;  Surgeon: Rexene Alberts, MD;  Location: [place];  Service: Open Heart Surgery;  Laterality: N/A;   CYSTOSCOPY/RETROGRADE/URETEROSCOPY/STONE EXTRACTION WITH BASKET     ESOPHAGECTOMY     Ivor-Lewis esophagectomy 1995 [facility]   IR THORACENTESIS ASP PLEURAL SPACE W/IMG GUIDE  06/06/2020   LEFT HEART CATH AND CORONARY ANGIOGRAPHY N/A 05/30/2020   Procedure: LEFT HEART CATH AND CORONARY ANGIOGRAPHY;  Surgeon: Martinique, Peter M, MD;  Location: [place] [facility];  Service: Cardiovascular;  Laterality: N/A;   partial esophageal ejectory     SHOULDER OPEN ROTATOR CUFF REPAIR Left 10/26/2019    Procedure: ROTATOR CUFF REPAIR SHOULDER OPEN LEFT;  Surgeon: [name], MD;  Location: [facility];  Service: Orthopedics;  Laterality: Left;    Family History  Problem Relation Age of Onset   CAD Father    CVA Father     Social History   Tobacco Use   Smoking status: Former    Packs/day: 1.00    Years: 25.00    Pack years: 25.00    Types: Cigarettes    Quit date: 02/19/1993    Years since quitting: 27.6   Smokeless tobacco: Never  Vaping Use   Vaping Use: Never used  Substance Use Topics   Alcohol use: Not Currently   Drug use: Never     Home Medications Prior to Admission medications   Medication Sig Start Date End Date Taking? Authorizing Provider  albuterol (VENTOLIN HFA) 108 (90 Base) MCG/ACT inhaler Inhale 1-2 puffs into the lungs every 6 (six) hours as needed for wheezing or shortness of breath.    [provider]  aspirin EC 81 MG EC tablet Take 1 tablet (81 mg total) by mouth daily. Swallow whole. 06/08/20   Antony Odea, PA-C  B Complex Vitamins (VITAMIN B COMPLEX PO) Take 1 tablet by mouth daily.  03/10/07   [provider]  cholecalciferol (VITAMIN D3) 25 MCG (1000 UNIT) tablet Take 1,000 Units by mouth daily.    [provider]  clopidogrel (PLAVIX) 75  MG tablet Take 1 tablet (75 mg total) by mouth daily. 06/08/20   Antony Odea, PA-C  diclofenac sodium (VOLTAREN) 1 % GEL Apply 2 g topically 2 (two) times daily as needed (pain).  09/24/15   [provider]  esomeprazole (NEXIUM) 40 MG capsule Take 40 mg by mouth 2 (two) times daily.  05/26/17   [provider]  ezetimibe (ZETIA) 10 MG tablet Take 1 tablet (10 mg total) by mouth daily. 07/04/20   Verta Ellen., NP  metoprolol tartrate (LOPRESSOR) 100 MG tablet Take 1 tablet (100 mg total) by mouth 2 (two) times daily. Patient taking differently: Take 50 mg by mouth 2 (two) times daily. 06/08/20   Antony Odea, PA-C  rosuvastatin (CRESTOR) 40 MG  tablet Take 1 tablet (40 mg total) by mouth daily at 6 PM. 09/29/20   Branch, Alphonse Guild, MD     Allergies    Zithromax [azithromycin]   Review of Systems   Review of Systems A comprehensive review of systems was completed and negative except as noted in HPI.    Physical Exam BP 132/87 (BP Location: Left Arm)   Pulse 100   Temp 97.9 F (36.6 C) (Oral)   Resp 19   Ht 5\' 5"  (1.651 m)   Wt 76.2 kg   SpO2 97%   BMI 27.96 kg/m   Physical Exam Vitals and nursing note reviewed.  Constitutional:      Appearance: Normal appearance.  HENT:     Head: Normocephalic and atraumatic.     Nose: Nose normal.     Mouth/Throat:     Mouth: Mucous membranes are moist.  Eyes:     Extraocular Movements: Extraocular movements intact.     Conjunctiva/sclera: Conjunctivae normal.  Cardiovascular:     Rate and Rhythm: Normal rate.  Pulmonary:     Effort: Pulmonary effort is normal.     Breath sounds: Rhonchi present.     Comments: Speaks in broken sentences Abdominal:     General: Abdomen is flat.     Palpations: Abdomen is soft.     Tenderness: There is no abdominal tenderness.  Musculoskeletal:        General: No swelling. Normal range of motion.     Cervical back: Neck supple.  Skin:    General: Skin is warm and dry.  Neurological:     General: No focal deficit present.     Mental Status: He is alert.  Psychiatric:        Mood and Affect: Mood normal.     ED Results / Procedures / Treatments   Labs (all labs ordered are listed, but only abnormal results are displayed) Labs Reviewed  COMPREHENSIVE METABOLIC PANEL - Abnormal; Notable for the following components:      Result Value   Glucose, Bld 174 (*)    Creatinine, Ser 1.27 (*)    Calcium 8.6 (*)    AST 53 (*)    All other components within normal limits  CBC WITH DIFFERENTIAL/PLATELET - Abnormal; Notable for the following components:   Hemoglobin 12.2 (*)    HCT 38.4 (*)    RDW 16.6 (*)    Neutro Abs 8.4 (*)     Lymphs Abs 0.4 (*)    All other components within normal limits  RESP PANEL BY RT-PCR (FLU A&B, COVID) [id number]  TROPONIN I (HIGH SENSITIVITY)    EKG EKG Interpretation  Date/Time:  Saturday October 14 2020 10:48:32 EDT Ventricular Rate:  100  PR Interval:  175 QRS Duration: 105 QT Interval:  350 QTC Calculation: 452 R Axis:   -20 Text Interpretation: Duplicate Confirmed by Calvert Cantor [phone]) on 10/14/2020 11:03:02 AM  Radiology DG Chest Port 1 View  Result Date: 10/14/2020 CLINICAL DATA:  70 year old male with weakness, shortness of breath and nausea vomiting since 0500 hours. Query aspiration. EXAM: PORTABLE CHEST 1 VIEW COMPARISON:  Chest radiograph 07/11/2020 and earlier. CT 06/23/2020. FINDINGS: Portable AP upright view at 1050 hours. Prior CT demonstrates sequelae of esophagectomy and gastric pull-through. Superimposed CABG changes also noted. Chronic blunting of the right costophrenic angle, but increased patchy opacity there compared to July radiographs. No superimposed pneumothorax or pulmonary edema. Left lung appears stable and relatively clear. Visualized tracheal air column is within normal limits. No acute osseous abnormality identified. Paucity of bowel gas in the upper abdomen. IMPRESSION: Chronic blunting of the right lung base status post prior esophagectomy and gastric pull-through, but there is increased patchy opacity there since July suspicious for aspiration in this setting. Electronically Signed   By: Genevie Ann M.D.   On: 10/14/2020 11:04    Procedures Procedures  Medications Ordered in the ED Medications  Ampicillin-Sulbactam (UNASYN) 3 g in sodium chloride 0.9 % 100 mL IVPB (3 g Intravenous New Bag/Given 10/14/20 1119)     MDM Rules/Calculators/A&P MDM Patient with gastric pull through reports an aspiration event this morning with SOB and dry cough, reported to be hypoxic with EMS. On O2 now. Will check labs, CXR and EKG.   ED Course  I have reviewed the  triage vital signs and the nursing notes.  Pertinent labs & imaging results that were available during my care of the patient were reviewed by me and considered in my medical decision making (see chart for details).  Clinical Course as of 10/14/20 1203  Sat Oct 14, 2020  1103 CBC with mild anemia, otherwise normal.  [CS]  7939 CXR with apparent RLL infiltrate by my read. Will give Unasyn for suspected aspiration.  [CS]  1139 CMP and Trop are unremarkable. Given CXR findings and oxygen requirement, will discuss admission with Hospitalist.  [CS]  1202 Spoke with Dr. Carles Collet, Hospitalist, who will come evaluate the patient for admission.  [CS]    Clinical Course User Index [CS] Truddie Hidden, MD    Final Clinical Impression(s) / ED Diagnoses Final diagnoses:  Aspiration pneumonia of right lower lobe due to gastric secretions [facility])    Rx / DC Orders ED Discharge Orders     None        Truddie Hidden, MD 10/14/20 1203

## 2020-10-14 NOTE — H&P (Signed)
History and Physical  James Wolf LYY:[id number] DOB: [birth year] DOA: 10/14/2020   PCP: Sherrilee Gilles, DO   Patient coming from: Home  Chief Complaint: sob  HPI:  James Wolf is a 70 y.o. 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  About one hour later, he had one episde of n/v.  Patient denies fevers, chills, headache, chest pain, nausea, vomiting, diarrhea, abdominal pain, dysuria, hematuria, hematochezia, and melena.  He has a nonproductive cough. Patient states that he had been in his usual state of health this past week.  He denies any worsening reflux symptoms or any aspiration events in the past week.  He states that he normally sleeps in almost a seated position because of his recurrent issues with his esophagectomy and aspiration.  He denies any new medications.  The patient was recently admitted to the hospital from 06/23/2020 to 06/25/2020 for aspiration pneumonia.  He was discharged home with Augmentin after being treated with Unasyn. From EMS was activated, they noted the patient have oxygen saturation of 82% on room air.  The patient was placed on 2 L with oxygen saturation into the mid 90s.  ED -Patient was afebrile and hemodynamically stable he was tachypneic with oxygen saturation 95% on 2 L.  BMP showed sodium 138, potassium 3.8, serum creatinine 1.27 from, AST 53, ALT 44, alk phosphatase 47, total bilirubin 0.5.  Chest x-ray showed chronic interstitial markings with increasing right lower lobe opacity. WBC 9.4, hemoglobin 12.2, platelets 182,000.  The patient was given a dose of Unasyn and admission was requested   Assessment/Plan: Acute respiratory failure  with hypoxia -Secondary to aspiration pneumonia -Presently stable on 2 L nasal cannula -Wean oxygen back to room air -Check D-dimer -Start duo nebs  Aspiration pneumonia -Continue Unasyn -Add doxycycline  GERD -Continue PPI   Coronary artery disease -S/p CABG x3 06/03/2018 -Continue aspirin Plavix -No chest pain -Continue statin -Continue metoprolol tartrate -Personally reviewed EKG--sinus rhythm, nonspecific T wave change   Essential hypertension -Restart metoprolol at lower dose given soft blood pressure -restart metoprolol  -previously on telmisartan which has been discontinued   Impaired glucose tolerance -06/01/2020 hemoglobin A1c 6.2   Dyslipidemia -Continue statin -LDL 99   LUL lung nodule -Continue outpatient surveillance   CKD stage IIIa -Baseline creatinine 1.0-1.3        Past Medical History:  Diagnosis Date   Anemia    Aspiration pneumonia (HCC)    Cancer (HCC)    esophageal cancer 1995   Chest pain    COPD (chronic obstructive pulmonary disease) (HCC)    GERD (gastroesophageal reflux disease)    History of kidney stones    Hyperlipidemia    Hypertension    Joint pain    Osteomyelitis (HCC)    Osteoporosis    Rheumatic fever    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   LIMA to LAD, SVG to OM, SVG to RCA   Past Surgical History:  Procedure Laterality Date   CORONARY ARTERY BYPASS GRAFT N/A 06/02/2020   Procedure: CORONARY ARTERY BYPASS GRAFTING (CABG) TIMES THREE USING RIGHT GREATER SAPHENOUS VEIN HARVESTED ENDOSCOPICALLY AND LEFT INTERNAL MAMMARY ARTERY.;  Surgeon: Rexene Alberts, MD;  Location: [place];  Service: Open Heart Surgery;  Laterality: N/A;   CYSTOSCOPY/RETROGRADE/URETEROSCOPY/STONE EXTRACTION WITH BASKET     ESOPHAGECTOMY     Ivor-Lewis esophagectomy 1995 [facility]   IR THORACENTESIS ASP PLEURAL SPACE W/IMG GUIDE  06/06/2020   LEFT HEART CATH AND CORONARY ANGIOGRAPHY N/A 05/30/2020   Procedure: LEFT HEART CATH AND CORONARY ANGIOGRAPHY;  Surgeon:  Martinique, Peter M, MD;  Location: [place] [facility];  Service: Cardiovascular;  Laterality: N/A;   partial esophageal ejectory     SHOULDER OPEN ROTATOR CUFF REPAIR Left 10/26/2019   Procedure: ROTATOR CUFF REPAIR SHOULDER OPEN LEFT;  Surgeon: [name], MD;  Location: [facility];  Service: Orthopedics;  Laterality: Left;   Social History:  reports that he quit smoking about 27 years ago. His smoking use included cigarettes. He has a 25.00 pack-year smoking history. He has never used smokeless tobacco. He reports that he does not currently use alcohol. He reports that he does not use drugs.   Family History  Problem Relation Age of Onset   CAD Father    CVA Father      Allergies  Allergen Reactions   Zithromax [Azithromycin] Diarrhea    Causes pt's stomach to "tear up" does not want to take medication again.      Prior to Admission medications   Medication Sig Start Date End Date Taking? Authorizing Provider  tamsulosin (FLOMAX) 0.4 MG CAPS capsule  03/22/20  Yes [provider]  albuterol (VENTOLIN HFA) 108 (90 Base) MCG/ACT inhaler Inhale 1-2 puffs into the lungs every 6 (six) hours as needed for wheezing or shortness of breath.    [provider]  aspirin EC 81 MG EC tablet Take 1 tablet (81 mg total) by mouth daily. Swallow whole. 06/08/20   Antony Odea, PA-C  B Complex Vitamins (VITAMIN B COMPLEX PO) Take 1 tablet by mouth daily.  03/10/07   [provider]  chlorhexidine (PERIDEX) 0.12 % solution SMARTSIG:By Mouth 09/01/20   [provider]  cholecalciferol (VITAMIN D3) 25 MCG (1000 UNIT) tablet Take 1,000 Units by mouth daily.    [provider]  clopidogrel (PLAVIX) 75 MG tablet Take 1 tablet (75 mg total) by mouth daily. 06/08/20   Antony Odea, PA-C  diclofenac sodium (VOLTAREN) 1 % GEL Apply 2 g topically 2 (two) times daily as needed (pain).  09/24/15   [provider]  esomeprazole (NEXIUM) 40 MG capsule  Take 40 mg by mouth 2 (two) times daily.  05/26/17   [provider]  ezetimibe (ZETIA) 10 MG tablet Take 1 tablet (10 mg total) by mouth daily. 07/04/20   Verta Ellen., NP  metoprolol tartrate (LOPRESSOR) 100 MG tablet Take 1 tablet (100 mg total) by mouth 2 (two) times daily. Patient taking differently: Take 50 mg by mouth 2 (two) times daily. 06/08/20   Antony Odea, PA-C  pentoxifylline (TRENTAL) 400 MG CR tablet Take 400 mg by mouth 2 (two) times daily. 09/01/20   [provider]  rosuvastatin (CRESTOR) 40 MG tablet Take 1 tablet (40 mg total) by mouth daily at 6 PM. 09/29/20   Branch, Alphonse Guild, MD    Review of Systems:  Constitutional:  No weight loss, night sweats, Fevers, chills, fatigue.  Head&Eyes: No headache.  No vision loss.  No eye pain or scotoma ENT:  No Difficulty swallowing,Tooth/dental problems,Sore throat,  No ear ache, post nasal drip,  Cardio-vascular:  No chest pain, Orthopnea, PND, swelling in lower extremities,  dizziness, palpitations  GI:  No  abdominal pain,  diarrhea, loss of appetite, hematochezia, melena, heartburn, indigestion, Resp:  No coughing up of blood .No wheezing.No chest wall deformity  Skin:  no rash or lesions.  GU:  no dysuria, change in color of urine, no urgency or frequency. No flank pain.  Musculoskeletal:  No joint pain or swelling. No decreased range of motion. No back pain.  Psych:  No change in mood or affect. No depression or anxiety. Neurologic: No headache, no dysesthesia, no focal weakness, no vision loss. No syncope  Physical Exam: Vitals:   10/14/20 1025 10/14/20 1029  BP:  132/87  Pulse:  100  Resp:  19  Temp:  97.9 F (36.6 C)  TempSrc:  Oral  SpO2: 97% 97%  Weight: 76.2 kg   Height: 5' 5"  (1.651 m)    General:  A&O x 3, NAD, nontoxic, pleasant/cooperative Head/Eye: No conjunctival hemorrhage, no icterus, [place]/AT, No nystagmus ENT:  No icterus,  No thrush, good dentition, no  pharyngeal exudate Neck:  No masses, no lymphadenpathy, no bruits CV:  RRR, no rub, no gallop, no S3 Lung:  bilateral rales, R>L.  No wheeze Abdomen: soft/NT, +BS, nondistended, no peritoneal signs Ext: No cyanosis, No rashes, No petechiae, No lymphangitis, No edema Neuro: CNII-XII intact, strength 4/5 in bilateral upper and lower extremities, no dysmetria  Labs on Admission:  Basic Metabolic Panel: Recent Labs  Lab 10/14/20 1037  NA 138  K 3.8  CL 106  CO2 25  GLUCOSE 174*  BUN 14  CREATININE 1.27*  CALCIUM 8.6*   Liver Function Tests: Recent Labs  Lab 10/14/20 1037  AST 53*  ALT 44  ALKPHOS 47  BILITOT 0.5  PROT 7.3  ALBUMIN 3.7   No results for input(s): LIPASE, AMYLASE in the last 168 hours. No results for input(s): AMMONIA in the last 168 hours. CBC: Recent Labs  Lab 10/14/20 1037  WBC 9.4  NEUTROABS 8.4*  HGB 12.2*  HCT 38.4*  MCV 87.5  PLT 182   Coagulation Profile: No results for input(s): INR, PROTIME in the last 168 hours. Cardiac Enzymes: No results for input(s): CKTOTAL, CKMB, CKMBINDEX, TROPONINI in the last 168 hours. BNP: Invalid input(s): POCBNP CBG: No results for input(s): GLUCAP in the last 168 hours. Urine analysis:    Component Value Date/Time   COLORURINE YELLOW 06/01/2020 1212   APPEARANCEUR HAZY (A) 06/01/2020 1212   LABSPEC 1.011 06/01/2020 1212   PHURINE 6.0 06/01/2020 1212   GLUCOSEU NEGATIVE 06/01/2020 1212   HGBUR NEGATIVE 06/01/2020 1212   BILIRUBINUR NEGATIVE 06/01/2020 1212   KETONESUR 5 (A) 06/01/2020 1212   PROTEINUR NEGATIVE 06/01/2020 1212   NITRITE NEGATIVE 06/01/2020 1212   [place] 06/01/2020 1212   Sepsis Labs: @LABRCNTIP (procalcitonin:4,lacticidven:4) )No results found for this or any previous visit (from the past 240 hour(s)).   Radiological Exams on Admission: DG Chest Port 1 View  Result Date: 10/14/2020 CLINICAL DATA:  70 year old male with weakness, shortness of breath and nausea  vomiting since 0500 hours. Query aspiration. EXAM: PORTABLE CHEST 1 VIEW COMPARISON:  Chest radiograph 07/11/2020 and earlier. CT 06/23/2020. FINDINGS: Portable AP upright view at 1050 hours. Prior CT demonstrates sequelae of esophagectomy and gastric pull-through. Superimposed CABG changes also noted. Chronic blunting of the right costophrenic angle, but increased patchy opacity there compared to July radiographs. No superimposed pneumothorax or pulmonary edema. Left lung appears stable and relatively clear. Visualized tracheal air column is within normal limits. No acute osseous abnormality identified. Paucity of bowel gas in the upper  abdomen. IMPRESSION: Chronic blunting of the right lung base status post prior esophagectomy and gastric pull-through, but there is increased patchy opacity there since July suspicious for aspiration in this setting. Electronically Signed   By: Genevie Ann M.D.   On: 10/14/2020 11:04    EKG: Independently reviewed. Sinus, nonspecific TWI    Time spent:70 minutes Code Status:   FULL Family Communication:  No Family at bedside Disposition Plan: expect 2day hospitalization Consults called: none DVT Prophylaxis: [place] Lovenox  Orson Eva, DO  [facility] Pager [phone]  If 7PM-7AM, please contact night-coverage [email or web address] Password [email or web address] 10/14/2020, 12:30 PM

## 2020-10-14 NOTE — ED Triage Notes (Signed)
Pt woke up at 0500 this morning with c/o n/v and weakness.

## 2020-10-15 ENCOUNTER — Observation Stay (HOSPITAL_COMMUNITY): Payer: Medicare Other

## 2020-10-15 ENCOUNTER — Inpatient Hospital Stay (HOSPITAL_COMMUNITY): Payer: Medicare Other

## 2020-10-15 DIAGNOSIS — K219 Gastro-esophageal reflux disease without esophagitis: Secondary | ICD-10-CM | POA: Diagnosis present

## 2020-10-15 DIAGNOSIS — I252 Old myocardial infarction: Secondary | ICD-10-CM | POA: Diagnosis not present

## 2020-10-15 DIAGNOSIS — R911 Solitary pulmonary nodule: Secondary | ICD-10-CM | POA: Diagnosis present

## 2020-10-15 DIAGNOSIS — I10 Essential (primary) hypertension: Secondary | ICD-10-CM | POA: Diagnosis not present

## 2020-10-15 DIAGNOSIS — I2510 Atherosclerotic heart disease of native coronary artery without angina pectoris: Secondary | ICD-10-CM | POA: Diagnosis present

## 2020-10-15 DIAGNOSIS — R7302 Impaired glucose tolerance (oral): Secondary | ICD-10-CM | POA: Diagnosis present

## 2020-10-15 DIAGNOSIS — Z951 Presence of aortocoronary bypass graft: Secondary | ICD-10-CM | POA: Diagnosis not present

## 2020-10-15 DIAGNOSIS — J449 Chronic obstructive pulmonary disease, unspecified: Secondary | ICD-10-CM | POA: Diagnosis present

## 2020-10-15 DIAGNOSIS — E785 Hyperlipidemia, unspecified: Secondary | ICD-10-CM | POA: Diagnosis present

## 2020-10-15 DIAGNOSIS — I21A1 Myocardial infarction type 2: Secondary | ICD-10-CM | POA: Diagnosis present

## 2020-10-15 DIAGNOSIS — I129 Hypertensive chronic kidney disease with stage 1 through stage 4 chronic kidney disease, or unspecified chronic kidney disease: Secondary | ICD-10-CM | POA: Diagnosis present

## 2020-10-15 DIAGNOSIS — Z8501 Personal history of malignant neoplasm of esophagus: Secondary | ICD-10-CM | POA: Diagnosis not present

## 2020-10-15 DIAGNOSIS — I6521 Occlusion and stenosis of right carotid artery: Secondary | ICD-10-CM | POA: Diagnosis present

## 2020-10-15 DIAGNOSIS — Z8249 Family history of ischemic heart disease and other diseases of the circulatory system: Secondary | ICD-10-CM | POA: Diagnosis not present

## 2020-10-15 DIAGNOSIS — N1831 Chronic kidney disease, stage 3a: Secondary | ICD-10-CM | POA: Diagnosis present

## 2020-10-15 DIAGNOSIS — Z823 Family history of stroke: Secondary | ICD-10-CM | POA: Diagnosis not present

## 2020-10-15 DIAGNOSIS — D6959 Other secondary thrombocytopenia: Secondary | ICD-10-CM | POA: Diagnosis present

## 2020-10-15 DIAGNOSIS — J9601 Acute respiratory failure with hypoxia: Secondary | ICD-10-CM | POA: Diagnosis present

## 2020-10-15 DIAGNOSIS — Z20822 Contact with and (suspected) exposure to covid-19: Secondary | ICD-10-CM | POA: Diagnosis present

## 2020-10-15 DIAGNOSIS — I25110 Atherosclerotic heart disease of native coronary artery with unstable angina pectoris: Secondary | ICD-10-CM | POA: Diagnosis not present

## 2020-10-15 DIAGNOSIS — Z8701 Personal history of pneumonia (recurrent): Secondary | ICD-10-CM | POA: Diagnosis not present

## 2020-10-15 DIAGNOSIS — J690 Pneumonitis due to inhalation of food and vomit: Secondary | ICD-10-CM | POA: Diagnosis present

## 2020-10-15 DIAGNOSIS — I214 Non-ST elevation (NSTEMI) myocardial infarction: Secondary | ICD-10-CM | POA: Diagnosis not present

## 2020-10-15 DIAGNOSIS — Z87891 Personal history of nicotine dependence: Secondary | ICD-10-CM | POA: Diagnosis not present

## 2020-10-15 DIAGNOSIS — Z87442 Personal history of urinary calculi: Secondary | ICD-10-CM | POA: Diagnosis not present

## 2020-10-15 DIAGNOSIS — D631 Anemia in chronic kidney disease: Secondary | ICD-10-CM | POA: Diagnosis present

## 2020-10-15 DIAGNOSIS — M810 Age-related osteoporosis without current pathological fracture: Secondary | ICD-10-CM | POA: Diagnosis present

## 2020-10-15 LAB — CBC
HCT: 32.9 % — ABNORMAL LOW (ref 39.0–52.0)
Hemoglobin: 10.3 g/dL — ABNORMAL LOW (ref 13.0–17.0)
MCH: 27.5 pg (ref 26.0–34.0)
MCHC: 31.3 g/dL (ref 30.0–36.0)
MCV: 87.7 fL (ref 80.0–100.0)
Platelets: 166 10*3/uL (ref 150–400)
RBC: 3.75 MIL/uL — ABNORMAL LOW (ref 4.22–5.81)
RDW: 17.6 % — ABNORMAL HIGH (ref 11.5–15.5)
WBC: 15.2 10*3/uL — ABNORMAL HIGH (ref 4.0–10.5)
nRBC: 0 % (ref 0.0–0.2)

## 2020-10-15 LAB — BLOOD GAS, ARTERIAL
Acid-base deficit: 2.4 mmol/L — ABNORMAL HIGH (ref 0.0–2.0)
Bicarbonate: 22.8 mmol/L (ref 20.0–28.0)
FIO2: 28
O2 Saturation: 95.1 %
Patient temperature: 37
pCO2 arterial: 29.8 mmHg — ABNORMAL LOW (ref 32.0–48.0)
pH, Arterial: 7.46 — ABNORMAL HIGH (ref 7.350–7.450)
pO2, Arterial: 74.5 mmHg — ABNORMAL LOW (ref 83.0–108.0)

## 2020-10-15 LAB — BASIC METABOLIC PANEL
Anion gap: 3 — ABNORMAL LOW (ref 5–15)
BUN: 18 mg/dL (ref 8–23)
CO2: 25 mmol/L (ref 22–32)
Calcium: 7.7 mg/dL — ABNORMAL LOW (ref 8.9–10.3)
Chloride: 112 mmol/L — ABNORMAL HIGH (ref 98–111)
Creatinine, Ser: 1.12 mg/dL (ref 0.61–1.24)
GFR, Estimated: >60 mL/min (ref >60)
Glucose, Bld: 119 mg/dL — ABNORMAL HIGH (ref 70–99)
Potassium: 4 mmol/L (ref 3.5–5.1)
Sodium: 140 mmol/L (ref 135–145)

## 2020-10-15 LAB — HEPARIN LEVEL (UNFRACTIONATED): Heparin Unfractionated: 0.29 IU/mL — ABNORMAL LOW (ref 0.30–0.70)

## 2020-10-15 LAB — MAGNESIUM: Magnesium: 1.6 mg/dL — ABNORMAL LOW (ref 1.7–2.4)

## 2020-10-15 LAB — TROPONIN I (HIGH SENSITIVITY)
Troponin I (High Sensitivity): 915 ng/L (ref <18)
Troponin I (High Sensitivity): 651 ng/L (ref <18)

## 2020-10-15 IMAGING — DX DG CHEST 1V PORT
1 series · 1 of 1 positions shown · non-contrast
Comparison: [DATE]

CLINICAL DATA: Dyspnea, aspiration pneumonia, esophageal cancer

EXAM:
PORTABLE CHEST 1 VIEW

[chest ap]
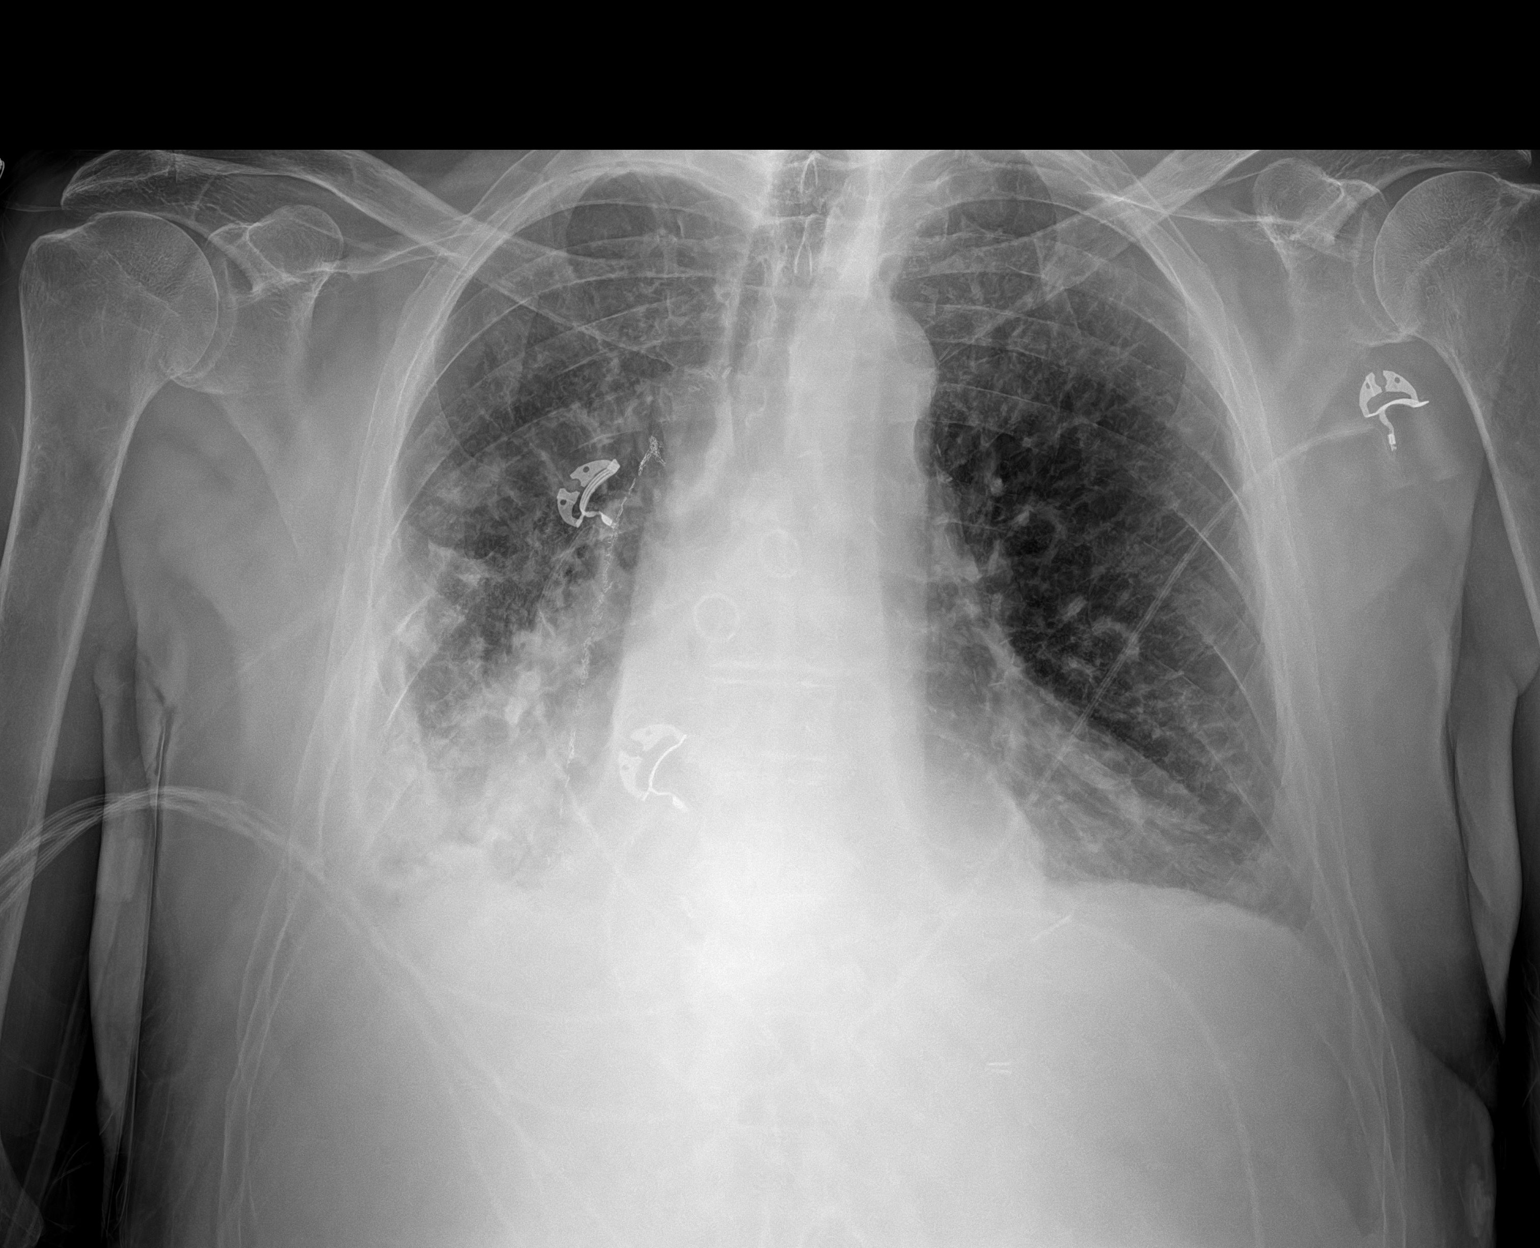

[1 of 1 positions shown; findings below may reference images not displayed]

FINDINGS: Pulmonary insufflation is stable. There is progressive right basilar
consolidation and increasing, small right pleural effusion noted.
Surgical changes of gastric pull-up are identified. Left lung is
clear. No pneumothorax. No pleural effusion on the left. Coronary
artery bypass grafting has been performed. Pulmonary vascularity is
normal.
IMPRESSION: Progressive right basilar consolidation and enlarging, small right
pleural effusion in keeping with a parapneumonic effusion.

## 2020-10-15 MED ORDER — HEPARIN (PORCINE) 25000 UT/250ML-% IV SOLN
1350.0000 [IU]/h | INTRAVENOUS | Status: DC
  Administered 2020-10-15: 1000 [IU]/h via INTRAVENOUS
  Administered 2020-10-16: 1100 [IU]/h via INTRAVENOUS
  Administered 2020-10-17: 1350 [IU]/h via INTRAVENOUS
  Filled 2020-10-15 (×3): qty 250

## 2020-10-15 MED ORDER — HEPARIN BOLUS VIA INFUSION
4000.0000 [IU] | Freq: Once | INTRAVENOUS | Status: AC
  Administered 2020-10-15: 4000 [IU] via INTRAVENOUS
  Filled 2020-10-15: qty 4000

## 2020-10-15 MED ORDER — PIPERACILLIN-TAZOBACTAM 3.375 G IVPB
3.3750 g | Freq: Three times a day (TID) | INTRAVENOUS | Status: DC
  Administered 2020-10-15 – 2020-10-18 (×9): 3.375 g via INTRAVENOUS
  Filled 2020-10-15 (×9): qty 50

## 2020-10-15 MED ORDER — IOHEXOL 350 MG/ML SOLN
75.0000 mL | Freq: Once | INTRAVENOUS | Status: AC | PRN
  Administered 2020-10-15: 75 mL via INTRAVENOUS

## 2020-10-15 NOTE — Care Management Obs Status (Signed)
[facility] NOTIFICATION   Patient Details  Name: James Wolf MRN: [id number] Date of Birth: 11-15-1950   Medicare Observation Status Notification Given:  Yes    Natasha Bence, LCSW 10/15/2020, 11:33 AM

## 2020-10-15 NOTE — Progress Notes (Signed)
ANTICOAGULATION CONSULT NOTE - Initial Consult  Pharmacy Consult for Heparin Indication: chest pain/ACS  Allergies  Allergen Reactions   Zithromax [Azithromycin] Diarrhea    Causes pt's stomach to "tear up" does not want to take medication again.     Patient Measurements: Height: 5\' 5"  (165.1 cm) Weight: 76.2 kg (168 lb) IBW/kg (Calculated) : 61.5 HEPARIN DW (KG): 76.2  Vital Signs: Temp: 98.1 F (36.7 C) (10/09 0924) Temp Source: Oral (10/09 0924) BP: 118/67 (10/09 0924) Pulse Rate: 80 (10/09 0924)  Labs: Recent Labs    10/14/20 1037 10/14/20 1250 10/15/20 0607 10/15/20 1003  HGB 12.2*  --  10.3*  --   HCT 38.4*  --  32.9*  --   PLT 182  --  166  --   CREATININE 1.27*  --  1.12  --   TROPONINIHS 5 6 915* 651*    Estimated Creatinine Clearance: 59.3 mL/min (by C-G formula based on SCr of 1.12 mg/dL).   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   Past Month   aspirin EC 81 MG EC tablet Take 1 tablet (81 mg total) by mouth daily. Swallow whole. 30 tablet 11 10/13/2020   B Complex Vitamins (VITAMIN B COMPLEX PO) Take 1 tablet by mouth daily.    10/13/2020   cholecalciferol (VITAMIN D3) 25 MCG (1000 UNIT) tablet Take 1,000 Units by mouth daily.   10/13/2020   clopidogrel (PLAVIX) 75 MG tablet Take 1 tablet (75 mg total)  by mouth daily. 30 tablet 11 10/13/2020 at 0900   diclofenac sodium (VOLTAREN) 1 % GEL Apply 2 g topically 2 (two) times daily as needed (pain).    10/13/2020   esomeprazole (NEXIUM) 40 MG capsule Take 40 mg by mouth 2 (two) times daily.    10/13/2020   ezetimibe (ZETIA) 10 MG tablet Take 1 tablet (10 mg total) by mouth daily. 90 tablet 1 10/13/2020   metoprolol tartrate (LOPRESSOR) 100 MG tablet Take 1 tablet (100 mg total) by mouth 2 (two) times daily. (Patient taking differently: Take 50 mg by mouth 2 (two) times daily.) 60 tablet 3 10/13/2020 at 1830   pentoxifylline (TRENTAL) 400 MG CR tablet Take 400 mg by mouth 2 (two) times daily.   10/13/2020   rosuvastatin (CRESTOR) 40 MG tablet Take 1 tablet (40 mg total) by mouth daily at 6 PM. 30 tablet 3 10/13/2020   tamsulosin (FLOMAX) 0.4 MG CAPS capsule Take 0.4 mg by mouth daily.   10/13/2020   chlorhexidine (PERIDEX) 0.12 % solution SMARTSIG:By Mouth (Patient not taking: No sig reported)   Not Taking    Assessment: No anticoagulants PTA.  Asked to initiate Heparin for ACS.  Goal of Therapy:  Heparin level 0.3-0.7 units/ml Monitor platelets by anticoagulation protocol: Yes   Plan:  Heparin bolus 4000 units x 1 Heparin infusion at 1000 units/hr Check HL in ~ 6 hours Monitor H/H, PLTs  Boss Danielsen A 10/15/2020,11:18 AM

## 2020-10-15 NOTE — Progress Notes (Signed)
Date and time results received: 10/15/20 0920 Test: troponin Critical Value: 915 Name of Provider Notified: Dr. Carles Collet Orders Received? Or Actions Taken?: n/a Deirdre Pippins, RN

## 2020-10-15 NOTE — Plan of Care (Signed)

## 2020-10-15 NOTE — Progress Notes (Addendum)
PROGRESS NOTE  James Wolf:[id number] DOB: October 06, 1950 DOA: 10/14/2020 PCP: Sherrilee Gilles, DO  Brief History:   70 y.o. 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  About one hour later, he had one episde of n/v.  Patient denies fevers, chills, headache, chest pain, nausea, vomiting, diarrhea, abdominal pain, dysuria, hematuria, hematochezia, and melena.  He has a nonproductive cough. Patient states that he had been in his usual state of health this past week.  He denies any worsening reflux symptoms or any aspiration events in the past week.  He states that he normally sleeps in almost a seated position because of his recurrent issues with his esophagectomy and aspiration.  He denies any new medications.  The patient was recently admitted to the hospital from 06/23/2020 to 06/25/2020 for aspiration pneumonia.  He was discharged home with Augmentin after being treated with Unasyn. From EMS was activated, they noted the patient have oxygen saturation of 82% on room air.  The patient was placed on 2 L with oxygen saturation into the mid 90s.   ED -Patient was afebrile and hemodynamically stable he was tachypneic with oxygen saturation 95% on 2 L.  BMP showed sodium 138, potassium 3.8, serum creatinine 1.27 from, AST 53, ALT 44, alk phosphatase 47, total bilirubin 0.5.  Chest x-ray showed chronic interstitial markings with increasing right lower lobe opacity. WBC 9.4, hemoglobin 12.2, platelets 182,000.  The patient was given a dose of Unasyn and admission was requested  Assessment/Plan:  Acute respiratory failure with hypoxia -Secondary to aspiration pneumonia -Presently stable  on 1.5 L nasal cannula -Wean oxygen back to room air -Check D-dimer-2.07 -PCT 5.24 -Continue duo nebs -still sob with minimal exertion -CTA chest   Aspiration pneumonia -Continue Unasyn>>zosyn -continue doxycycline   GERD -Continue PPI   Coronary artery disease -S/p CABG x3 06/02/2020 -Continue aspirin Plavix -No chest pain but complains of left shoulder burning -Continue statin -Continue metoprolol tartrate -repeat EKG -cycle troponins   Essential hypertension -Restart metoprolol at lower dose given soft blood pressure -restart metoprolol  -previously on telmisartan which has been discontinued   Impaired glucose tolerance -06/01/2020 hemoglobin A1c 6.2   Dyslipidemia -Continue statin -LDL 99>>21   LUL lung nodule -Continue outpatient surveillance   CKD stage IIIa -Baseline creatinine 1.0-1.3     Status is: Observation  The patient will require care spanning > 2 midnights and should be moved to inpatient because: Inpatient level of care appropriate due to severity of illness  Dispo: The patient is from: Home              Anticipated d/c is to: Home              Patient currently is not medically stable to d/c.   Difficult to place patient No        Family Communication:   spouse updated at bedside 10/9  Consultants:  none  Code Status:  FULL   DVT Prophylaxis:  [place] Lovenox   Procedures: As Listed in Progress Note Above  Antibiotics: Unasyn 10/8>>10/9      Subjective: Patient continues to have some sob, but improving.  Denies cp, but complains of left shoulder burning. Denies f/c,  n/v/d, abd pain, headache  Objective: Vitals:   10/14/20 1947 10/14/20 2100 10/15/20 0520 10/15/20 0726  BP:  127/64 119/62   Pulse:  (!) 105 69   Resp:  18 18   Temp:  99.9 F (37.7 C) 98.3 F (36.8 C)   TempSrc:  Oral Oral   SpO2: 95% 96% 96% 94%  Weight:      Height:        Intake/Output Summary (Last 24 hours) at 10/15/2020 0803 Last data filed  at 10/14/2020 1700 Gross per 24 hour  Intake 1012.67 ml  Output --  Net 1012.67 ml   Weight change:  Exam:  General:  Pt is alert, follows commands appropriately, not in acute distress HEENT: No icterus, No thrush, No neck mass, /AT Cardiovascular: RRR, S1/S2, no rubs, no gallops Respiratory: diminished BS on R;  right basilar crackles. No wheeze Abdomen: Soft/+BS, non tender, non distended, no guarding Extremities: No edema, No lymphangitis, No petechiae, No rashes, no synovitis   Data Reviewed: I have personally reviewed following labs and imaging studies Basic Metabolic Panel: Recent Labs  Lab 10/14/20 1037 10/15/20 0607  NA 138 140  K 3.8 4.0  CL 106 112*  CO2 25 25  GLUCOSE 174* 119*  BUN 14 18  CREATININE 1.27* 1.12  CALCIUM 8.6* 7.7*  MG  --  1.6*   Liver Function Tests: Recent Labs  Lab 10/14/20 1037  AST 53*  ALT 44  ALKPHOS 47  BILITOT 0.5  PROT 7.3  ALBUMIN 3.7   No results for input(s): LIPASE, AMYLASE in the last 168 hours. No results for input(s): AMMONIA in the last 168 hours. Coagulation Profile: No results for input(s): INR, PROTIME in the last 168 hours. CBC: Recent Labs  Lab 10/14/20 1037 10/15/20 0607  WBC 9.4 15.2*  NEUTROABS 8.4*  --   HGB 12.2* 10.3*  HCT 38.4* 32.9*  MCV 87.5 87.7  PLT 182 166   Cardiac Enzymes: No results for input(s): CKTOTAL, CKMB, CKMBINDEX, TROPONINI in the last 168 hours. BNP: Invalid input(s): POCBNP CBG: No results for input(s): GLUCAP in the last 168 hours. HbA1C: No results for input(s): HGBA1C in the last 72 hours. Urine analysis:    Component Value Date/Time   COLORURINE YELLOW 06/01/2020 1212   APPEARANCEUR HAZY (A) 06/01/2020 1212   LABSPEC 1.011 06/01/2020 1212   PHURINE 6.0 06/01/2020 1212   GLUCOSEU NEGATIVE 06/01/2020 1212   HGBUR NEGATIVE 06/01/2020 1212   [place] 06/01/2020 1212   KETONESUR 5 (A) 06/01/2020 1212   PROTEINUR NEGATIVE 06/01/2020 1212   NITRITE  NEGATIVE 06/01/2020 1212   [place] 06/01/2020 1212   Sepsis Labs: @LABRCNTIP (procalcitonin:4,lacticidven:4) ) Recent Results (from the past 240 hour(s))  Resp Panel by RT-PCR (Flu A&B, Covid) Nasopharyngeal Swab     Status: None   Collection Time: 10/14/20 11:40 AM   Specimen: Nasopharyngeal Swab; Nasopharyngeal(NP) swabs in vial transport medium  Result Value Ref Range Status   SARS Coronavirus 2 by RT PCR NEGATIVE NEGATIVE Final    Comment: (NOTE) SARS-CoV-2 target nucleic acids are NOT DETECTED.  The SARS-CoV-2 RNA is generally detectable in upper respiratory specimens during the acute phase of infection. The lowest concentration of SARS-CoV-2 viral copies this assay can detect is 138 copies/mL. A negative result does not preclude SARS-Cov-2 infection and should not be used as the sole basis for treatment or other patient management decisions. A negative result may occur with  improper specimen collection/handling, submission of specimen other than nasopharyngeal swab,  presence of viral mutation(s) within the areas targeted by this assay, and inadequate number of viral copies(<138 copies/mL). A negative result must be combined with clinical observations, patient history, and epidemiological information. The expected result is Negative.  Fact Sheet for Patients:  EntrepreneurPulse.com.au  Fact Sheet for Healthcare Providers:  IncredibleEmployment.be  This test is no t yet approved or cleared by the [place] FDA and  has been authorized for detection and/or diagnosis of SARS-CoV-2 by FDA under an Emergency Use Authorization (EUA). This EUA will remain  in effect (meaning this test can be used) for the duration of the COVID-19 declaration under Section 564(b)(1) of the Act, 21 U.S.C.section 360bbb-3(b)(1), unless the authorization is terminated  or revoked sooner.       Influenza A by PCR NEGATIVE NEGATIVE Final    Influenza B by PCR NEGATIVE NEGATIVE Final    Comment: (NOTE) The Xpert Xpress SARS-CoV-2/FLU/RSV plus assay is intended as an aid in the diagnosis of influenza from Nasopharyngeal swab specimens and should not be used as a sole basis for treatment. Nasal washings and aspirates are unacceptable for Xpert Xpress SARS-CoV-2/FLU/RSV testing.  Fact Sheet for Patients: EntrepreneurPulse.com.au  Fact Sheet for Healthcare Providers: IncredibleEmployment.be  This test is not yet approved or cleared by the [place] FDA and has been authorized for detection and/or diagnosis of SARS-CoV-2 by FDA under an Emergency Use Authorization (EUA). This EUA will remain in effect (meaning this test can be used) for the duration of the COVID-19 declaration under Section 564(b)(1) of the Act, 21 U.S.C. section 360bbb-3(b)(1), unless the authorization is terminated or revoked.  Performed at [facility], [address], [place], [place] [address]      Scheduled Meds:  aspirin EC  81 mg Oral Daily   B-complex with vitamin C  1 tablet Oral Daily   cholecalciferol  1,000 Units Oral Daily   clopidogrel  75 mg Oral Daily   enoxaparin (LOVENOX) injection  40 mg Subcutaneous Q24H   ezetimibe  10 mg Oral Daily   ipratropium-albuterol  3 mL Nebulization Once   ipratropium-albuterol  3 mL Nebulization TID   metoprolol tartrate  25 mg Oral BID   pantoprazole  40 mg Oral BID   pentoxifylline  400 mg Oral BID   rosuvastatin  40 mg Oral q1800   Continuous Infusions:  sodium chloride 10 mL/hr at 10/14/20 1757   0.9 % NaCl with KCl 20 mEq / L 75 mL/hr at 10/15/20 0555   ampicillin-sulbactam (UNASYN) IV 3 g (10/15/20 0507)   doxycycline (VIBRAMYCIN) IV 100 mg (10/15/20 0554)    Procedures/Studies: DG Shoulder Right  Result Date: 09/20/2020 Right shoulder x-ray pain for 32 years Mild narrowing of the right shoulder glenohumeral joint sclerosis of the rotator cuff  insertion for mild AC joint narrowing Impression mild arthritis right shoulder mild AC joint arthritis  DG Chest Port 1 View  Result Date: 10/14/2020 CLINICAL DATA:  70 year old male with weakness, shortness of breath and nausea vomiting since 0500 hours. Query aspiration. EXAM: PORTABLE CHEST 1 VIEW COMPARISON:  Chest radiograph 07/11/2020 and earlier. CT 06/23/2020. FINDINGS: Portable AP upright view at 1050 hours. Prior CT demonstrates sequelae of esophagectomy and gastric pull-through. Superimposed CABG changes also noted. Chronic blunting of the right costophrenic angle, but increased patchy opacity there compared to July radiographs. No superimposed pneumothorax or pulmonary edema. Left lung appears stable and relatively clear. Visualized tracheal air column is within normal limits. No acute osseous abnormality identified. Paucity of bowel gas in  the upper abdomen. IMPRESSION: Chronic blunting of the right lung base status post prior esophagectomy and gastric pull-through, but there is increased patchy opacity there since July suspicious for aspiration in this setting. Electronically Signed   By: Genevie Ann M.D.   On: 10/14/2020 11:04    Orson Eva, DO  [facility]  If 7PM-7AM, please contact night-coverage [email or web address] Password [email or web address] 10/15/2020, 8:03 AM   LOS: 0 days

## 2020-10-15 NOTE — Progress Notes (Addendum)
Responded to nursing call:  doesn't look right, looks flushed   Subjective: Patient denies chest pressure, denies chest discomfort.  Denies any worsen sob.  States his breathing is as good as earlier in the day.  Denies dizziness, n/v.  States he has had intermittent left shoulder ache.  Vitals:   10/15/20 1435 10/15/20 1842 10/15/20 1900 10/15/20 1938  BP:  (!) 150/80    Pulse:  94  75  Resp:  20  18  Temp:   99.2 F (37.3 C)   TempSrc:   Oral   SpO2: 95% 96%  97%  Weight:      Height:       CV--RRR Lung--R-basilar crackles>L Abd--soft+BS/NT   Assessment/Plan:  Elevated troponin -personally reviewed EKG@1900 --nonspecific T wave change V1, V2, unchanged from prior -personally reviewed CXR--slight increase R-base consolidation with small effusion (unchanged);  no pulm edema -7.460/29/74/22 on 2L -T99.2--HR 86--RR18--142/84--97% on 2L -pt given acetaminophen prior to my arrival--states he is starting to feel better now with less flushed sensation and shoulder ache resolved. -suspect likely due to patient's low grade fever -discussed patient with crosscoverage--Dr. Bearl Mulberry Ames Hoban, DO [facility]

## 2020-10-15 NOTE — Progress Notes (Signed)
Patient complains of left arm/shoulder "ache" similar to prior cardiac episode in May 2022 -EKG obtained--sinus with TWI V1,V2 -troponins 915>>651 -started IV heparin -10/14/20 troponins 5>>6 -limited echo -case discussed with cardiology, Dr. Croitoru>>pt does not require urgent cath or transfer to [facility] -agrees with above -Plan cardiology consult in am to clarify further plan of medical care -Revisited patient and updated him;  pt states left arm "ache"/pain are improved.  He denies sob, n/v, dizziness -CTA chest--no PE  DTat

## 2020-10-16 ENCOUNTER — Encounter (HOSPITAL_COMMUNITY): Payer: Self-pay | Admitting: Internal Medicine

## 2020-10-16 ENCOUNTER — Inpatient Hospital Stay (HOSPITAL_COMMUNITY): Payer: Medicare Other

## 2020-10-16 ENCOUNTER — Encounter (HOSPITAL_COMMUNITY): Payer: Medicare Other

## 2020-10-16 DIAGNOSIS — I10 Essential (primary) hypertension: Secondary | ICD-10-CM | POA: Diagnosis not present

## 2020-10-16 DIAGNOSIS — D6489 Other specified anemias: Secondary | ICD-10-CM

## 2020-10-16 DIAGNOSIS — E785 Hyperlipidemia, unspecified: Secondary | ICD-10-CM

## 2020-10-16 DIAGNOSIS — I214 Non-ST elevation (NSTEMI) myocardial infarction: Secondary | ICD-10-CM

## 2020-10-16 DIAGNOSIS — N1831 Chronic kidney disease, stage 3a: Secondary | ICD-10-CM

## 2020-10-16 DIAGNOSIS — J9601 Acute respiratory failure with hypoxia: Secondary | ICD-10-CM

## 2020-10-16 LAB — ECHOCARDIOGRAM LIMITED
Calc EF: 50 %
Height: 65 in
S' Lateral: 3.3 cm
Single Plane A2C EF: 50.7 %
Single Plane A4C EF: 50.9 %
Weight: 2688 oz

## 2020-10-16 LAB — BASIC METABOLIC PANEL
Anion gap: 6 (ref 5–15)
BUN: 13 mg/dL (ref 8–23)
CO2: 22 mmol/L (ref 22–32)
Calcium: 8.1 mg/dL — ABNORMAL LOW (ref 8.9–10.3)
Chloride: 111 mmol/L (ref 98–111)
Creatinine, Ser: 1.07 mg/dL (ref 0.61–1.24)
GFR, Estimated: >60 mL/min (ref >60)
Glucose, Bld: 116 mg/dL — ABNORMAL HIGH (ref 70–99)
Potassium: 4.2 mmol/L (ref 3.5–5.1)
Sodium: 139 mmol/L (ref 135–145)

## 2020-10-16 LAB — CBC
HCT: 32.2 % — ABNORMAL LOW (ref 39.0–52.0)
Hemoglobin: 9.8 g/dL — ABNORMAL LOW (ref 13.0–17.0)
MCH: 26.8 pg (ref 26.0–34.0)
MCHC: 30.4 g/dL (ref 30.0–36.0)
MCV: 88 fL (ref 80.0–100.0)
Platelets: 141 10*3/uL — ABNORMAL LOW (ref 150–400)
RBC: 3.66 MIL/uL — ABNORMAL LOW (ref 4.22–5.81)
RDW: 17.4 % — ABNORMAL HIGH (ref 11.5–15.5)
WBC: 13.6 10*3/uL — ABNORMAL HIGH (ref 4.0–10.5)
nRBC: 0 % (ref 0.0–0.2)

## 2020-10-16 LAB — HEPARIN LEVEL (UNFRACTIONATED)
Heparin Unfractionated: 0.17 IU/mL — ABNORMAL LOW (ref 0.30–0.70)
Heparin Unfractionated: 0.37 IU/mL (ref 0.30–0.70)

## 2020-10-16 LAB — TROPONIN I (HIGH SENSITIVITY): Troponin I (High Sensitivity): 863 ng/L (ref <18)

## 2020-10-16 MED ORDER — DICLOFENAC SODIUM 1 % EX GEL
2.0000 g | Freq: Four times a day (QID) | CUTANEOUS | Status: DC
  Administered 2020-10-16 – 2020-10-18 (×7): 2 g via TOPICAL
  Filled 2020-10-16: qty 100

## 2020-10-16 MED ORDER — HEPARIN BOLUS VIA INFUSION
2000.0000 [IU] | Freq: Once | INTRAVENOUS | Status: AC
  Administered 2020-10-16: 2000 [IU] via INTRAVENOUS
  Filled 2020-10-16: qty 2000

## 2020-10-16 MED ORDER — GUAIFENESIN ER 600 MG PO TB12
1200.0000 mg | ORAL_TABLET | Freq: Two times a day (BID) | ORAL | Status: DC
  Administered 2020-10-16 – 2020-10-18 (×4): 1200 mg via ORAL
  Filled 2020-10-16 (×4): qty 2

## 2020-10-16 NOTE — Consult Note (Addendum)
Cardiology Consultation:   Patient ID: James Wolf MRN: [id number]; DOB: March 14, 1950  Admit date: 10/14/2020 Date of Consult: 10/16/2020  PCP:  Sherrilee Gilles, [facility] Cardiologist:  Carlyle Dolly, MD        Patient Profile:   James Wolf is a 70 y.o. male with a hx of aspiration PNA, COPD, CAD with NSTEMI s/p CABG 05/2020 (LIMA-LAD, SVG-OM, SVG-distal RCA), carotid stenosis (98-33% RICA, 8-25% LICA 0/5397), HTN, HLD, esophageal cancer s/p partial esophagectomy with gastric pull-through 1995, pre-DM, lung nodule, CKD 3a, chronic anemia (has gotten iron infusions at [facility]), rheumatic fever, oseoporosis, GERD who is being seen 10/16/2020 for the evaluation of shoulder pain/elevated troponin at the request of Dr. Carles Collet.  History of Present Illness:   Mr. Catala follows with Dr. Harl Bowie for hx of CAD diagnosed with NSTEMI 05/2020. At the time of his MI, his presenting complaint was left shoulder pain, left pectoral pain and radiation to his neck. He worked in EMS for 40 years and was surprised to find this was his anginal equivalent because he'd also had shoulder surgery in 10/2019 and would periodically have similar pain ever since the surgery. He ultimately underwent CABG as above and was placed on DAPT. At time of that admission 2D echo had shown normal LVEF 60-65%, otherwise unrevealing. He had 60-79% carotid stenosis on the right which was to be followed up with carotid duplex in 11/2020.  He was admitted in 06/2200 for aspiration PNA requiring antibiotics. He was doing well at last OV 10/03/20. He still would have the shoulder discomfort from time to time, but no specific exertional pattern.  He presented back to the hospital with worsening SOB and wheezing that began the morning of 10/8. He had felt himself aspirate with acid refluxing up and coughing/sputtering beforehand.  He also had an episode of n/v shortly after. EMS was called and he was found to be hypoxic 82%  RA requiring [place] O2. CXR with increased patchy opacity since July suspicious for aspiration. He was placed on antibiotics. On the evening of 10/8 he began having intermittent achy left shoulder pain in the ball of the joint going down his left arm. He states he tried Motrin without any relief. He did not receive any SL NTG or morphine. There were no aggravating or relieving factors - would wax and wane on its own. Yesterday the discomfort intensified until around 1am when it eased off spontaneously. CT angio yesterday ruled out PE but did show right middle lobe and right lower lobe airspace disease concerning for pneumonia and esophageal resection with gastric pull-through with a large amount of food material within the neo-esophagus. F/u CXR yesterday also showed progressive right basilar consolidation and enlarging, small right pleural effusion in keeping with a parapneumonic effusion. Labs this admission are notable for procalcitonin of 5.24, lactic acid 1.8, BNP 166, d-dimer 2.07, hsTroponin 5->6->915->651->863, leukocytosis peak 15.2, mild anemia to 9.8 and thrombocytopenia of 141. Covid/flu panel negative. In addition to his antibiotic and neb therapy, he has been started on heparin per pharmacy. He states he feels better this morning, just a little weak/tired.    Past Medical History:  Diagnosis Date   Anemia    Aspiration pneumonia (HCC)    CAD (coronary artery disease)    a. s/p CABG 05/2020.   Cancer [facility])    esophageal cancer 1995   Carotid artery disease (HCC)    Chronic kidney disease, stage 3a (HCC)    COPD (chronic  obstructive pulmonary disease) (HCC)    GERD (gastroesophageal reflux disease)    History of kidney stones    Hyperlipidemia    Hypertension    Joint pain    Lung nodule    Osteomyelitis (HCC)    Osteoporosis    Rheumatic fever    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   LIMA to LAD, SVG to OM, SVG to RCA    Past Surgical History:  Procedure Laterality Date   CORONARY ARTERY  BYPASS GRAFT N/A 06/02/2020   Procedure: CORONARY ARTERY BYPASS GRAFTING (CABG) TIMES THREE USING RIGHT GREATER SAPHENOUS VEIN HARVESTED ENDOSCOPICALLY AND LEFT INTERNAL MAMMARY ARTERY.;  Surgeon: Rexene Alberts, MD;  Location: [facility];  Service: Open Heart Surgery;  Laterality: N/A;   CYSTOSCOPY/RETROGRADE/URETEROSCOPY/STONE EXTRACTION WITH BASKET     ESOPHAGECTOMY     Ivor-Lewis esophagectomy 1995 [facility]   IR THORACENTESIS ASP PLEURAL SPACE W/IMG GUIDE  06/06/2020   LEFT HEART CATH AND CORONARY ANGIOGRAPHY N/A 05/30/2020   Procedure: LEFT HEART CATH AND CORONARY ANGIOGRAPHY;  Surgeon: Martinique, Peter M, MD;  Location: [facility];  Service: Cardiovascular;  Laterality: N/A;   partial esophageal ejectory     SHOULDER OPEN ROTATOR CUFF REPAIR Left 10/26/2019   Procedure: ROTATOR CUFF REPAIR SHOULDER OPEN LEFT;  Surgeon: [name], MD;  Location: [facility];  Service: Orthopedics;  Laterality: Left;     Home Medications:  Prior to Admission medications   Medication Sig Start Date End Date Taking? Authorizing Provider  albuterol (VENTOLIN HFA) 108 (90 Base) MCG/ACT inhaler Inhale 1-2 puffs into the lungs every 6 (six) hours as needed for wheezing or shortness of breath.   Yes [provider]  aspirin EC 81 MG EC tablet Take 1 tablet (81 mg total) by mouth daily. Swallow whole. 06/08/20  Yes Roddenberry, Myron G, PA-C  B Complex Vitamins (VITAMIN B COMPLEX PO) Take 1 tablet by mouth daily.  03/10/07  Yes [provider]  cholecalciferol (VITAMIN D3) 25 MCG (1000 UNIT) tablet Take 1,000 Units by mouth daily.   Yes [provider]  clopidogrel (PLAVIX) 75 MG tablet Take 1 tablet (75 mg total) by mouth daily. 06/08/20  Yes Roddenberry, Arlis Porta, PA-C  diclofenac sodium (VOLTAREN) 1 % GEL Apply 2 g topically 2 (two) times daily as needed (pain).  09/24/15  Yes [provider]  esomeprazole (NEXIUM) 40 MG capsule Take 40 mg by mouth 2 (two) times daily.  05/26/17  Yes  [provider]  ezetimibe (ZETIA) 10 MG tablet Take 1 tablet (10 mg total) by mouth daily. 07/04/20  Yes Verta Ellen., NP  metoprolol tartrate (LOPRESSOR) 100 MG tablet Take 1 tablet (100 mg total) by mouth 2 (two) times daily. Patient taking differently: Take 50 mg by mouth 2 (two) times daily. 06/08/20  Yes Roddenberry, Arlis Porta, PA-C  pentoxifylline (TRENTAL) 400 MG CR tablet Take 400 mg by mouth 2 (two) times daily. 09/01/20  Yes [provider]  rosuvastatin (CRESTOR) 40 MG tablet Take 1 tablet (40 mg total) by mouth daily at 6 PM. 09/29/20  Yes Branch, Alphonse Guild, MD  tamsulosin (FLOMAX) 0.4 MG CAPS capsule Take 0.4 mg by mouth daily. 03/22/20  Yes [provider]  chlorhexidine (PERIDEX) 0.12 % solution SMARTSIG:By Mouth Patient not taking: No sig reported 09/01/20   [provider]    Inpatient Medications: Scheduled Meds:  aspirin EC  81 mg Oral Daily   B-complex with vitamin C  1 tablet Oral  Daily   cholecalciferol  1,000 Units Oral Daily   clopidogrel  75 mg Oral Daily   ezetimibe  10 mg Oral Daily   ipratropium-albuterol  3 mL Nebulization Once   ipratropium-albuterol  3 mL Nebulization TID   metoprolol tartrate  25 mg Oral BID   pantoprazole  40 mg Oral BID   pentoxifylline  400 mg Oral BID   rosuvastatin  40 mg Oral q1800   Continuous Infusions:  sodium chloride 10 mL/hr at 10/14/20 1757   0.9 % NaCl with KCl 20 mEq / L 75 mL/hr at 10/15/20 2004   doxycycline (VIBRAMYCIN) IV 100 mg (10/16/20 0535)   heparin 1,100 Units/hr (10/16/20 0753)   piperacillin-tazobactam (ZOSYN)  IV 3.375 g (10/16/20 0317)   PRN Meds: sodium chloride, acetaminophen **OR** acetaminophen, prochlorperazine  Allergies:    Allergies  Allergen Reactions   Zithromax [Azithromycin] Diarrhea    Causes pt's stomach to "tear up" does not want to take medication again.     Social History:   Social History   Socioeconomic History   Marital status: Married     Spouse name: Not on file   Number of children: Not on file   Years of education: Not on file   Highest education level: Not on file  Occupational History   Not on file  Tobacco Use   Smoking status: Former    Packs/day: 1.00    Years: 25.00    Pack years: 25.00    Types: Cigarettes    Quit date: 02/19/1993    Years since quitting: 27.6   Smokeless tobacco: Never  Vaping Use   Vaping Use: Never used  Substance and Sexual Activity   Alcohol use: Not Currently   Drug use: Never   Sexual activity: Yes  Other Topics Concern   Not on file  Social History Narrative   Not on file   Social Determinants of Health   Financial Resource Strain: Not on file  Food Insecurity: Not on file  Transportation Needs: Not on file  Physical Activity: Not on file  Stress: Not on file  Social Connections: Not on file  Intimate Partner Violence: Not on file    Family History:   Family History  Problem Relation Age of Onset   Heart disease Mother    CAD Father    CVA Father      ROS:  Please see the history of present illness.  All other ROS reviewed and negative.     Physical Exam/Data:   Vitals:   10/15/20 2109 10/16/20 0508 10/16/20 0801 10/16/20 0825  BP: 134/85 140/75 (!) 142/81   Pulse: (!) 103 86 85   Resp: 20 20 20    Temp: 98.5 F (36.9 C) 98.9 F (37.2 C) 98.7 F (37.1 C)   TempSrc: Oral Oral Oral   SpO2: 96% 96% 97% 96%  Weight:      Height:        Intake/Output Summary (Last 24 hours) at 10/16/2020 0903 Last data filed at 10/16/2020 0600 Gross per 24 hour  Intake 4413 ml  Output 200 ml  Net 4213 ml   Last 3 Weights 10/14/2020 10/03/2020 09/20/2020  Weight (lbs) 168 lb 167 lb 12.8 oz 165 lb  Weight (kg) 76.204 kg 76.114 kg 74.844 kg     Body mass index is 27.96 kg/m.  General: Well developed, well nourished WM, in no acute distress. Head: Normocephalic, atraumatic, sclera non-icteric, no xanthomas, nares are without discharge. Neck: Negative for carotid  bruits. JVP not elevated. Lungs: Very quiet diffuse rhonchi, rare wheezing, decreased BS/crackles R base. Moderate air movement.  Heart: RRR S1 S2 without murmurs, rubs, or gallops.  Abdomen: Soft, non-tender, non-distended with normoactive bowel sounds. No rebound/guarding. Extremities: No clubbing or cyanosis. No edema. Distal pedal pulses are 2+ and equal bilaterally. Neuro: Alert and oriented X 3. Moves all extremities spontaneously. Psych:  Responds to questions appropriately with a normal affect.  EKG:  The EKG was personally reviewed and demonstrates:  sinus tach 100bpm, borderline left axis deviation, mild nonspecific STTW changes (similar to prior tracing) F/u EKG 10/9 AM and PM are similar with NSR and similar mild nonspecific STTW changes also subtle mild STE slightly more pronounced than previous tracings  Telemetry:  Telemetry was personally reviewed and demonstrates:  NSR  Relevant CV Studies: 2D echo 05/31/20  1. Left ventricular ejection fraction, by estimation, is 60 to 65%. The  left ventricle has normal function. The left ventricle has no regional  wall motion abnormalities. Left ventricular diastolic parameters were  normal.   2. Right ventricular systolic function is normal. The right ventricular  size is normal. There is normal pulmonary artery systolic pressure.   3. The mitral valve is normal in structure. No evidence of mitral valve  regurgitation. No evidence of mitral stenosis.   4. The aortic valve is tricuspid. Aortic valve regurgitation is not  visualized. Mild aortic valve sclerosis is present, with no evidence of  aortic valve stenosis.   5. The inferior vena cava is normal in size with greater than 50%  respiratory variability, suggesting right atrial pressure of 3 mmHg.   Cath 05/2020 Ost LM to Dist LM lesion is 90% stenosed. Mid LAD lesion is 90% stenosed. Prox Cx lesion is 50% stenosed. 1st Mrg lesion is 70% stenosed. Ost RCA to Prox RCA lesion is  50% stenosed. Mid RCA lesion is 70% stenosed. Dist RCA lesion is 70% stenosed. LV end diastolic pressure is normal.   1. 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 Normal LVEDP   Plan: Echo to assess LV. Recommend CT surgery consultation for CABG.     Laboratory Data:  High Sensitivity Troponin:   Recent Labs  Lab 10/14/20 1037 10/14/20 1250 10/15/20 0607 10/15/20 1003 10/16/20 0401  TROPONINIHS 5 6 915* 651* 863*     Chemistry Recent Labs  Lab 10/14/20 1037 10/15/20 0607 10/16/20 0401  NA 138 140 139  K 3.8 4.0 4.2  CL 106 112* 111  CO2 25 25 22   GLUCOSE 174* 119* 116*  BUN 14 18 13   CREATININE 1.27* 1.12 1.07  CALCIUM 8.6* 7.7* 8.1*  MG  --  1.6*  --   GFRNONAA >60 >60 >60  ANIONGAP 7 3* 6    Recent Labs  Lab 10/14/20 1037  PROT 7.3  ALBUMIN 3.7  AST 53*  ALT 44  ALKPHOS 47  BILITOT 0.5   Lipids  Recent Labs  Lab 10/12/20 0936  CHOL 78  TRIG 55  HDL 46  LDLCALC 21  CHOLHDL 1.7    Hematology Recent Labs  Lab 10/14/20 1037 10/15/20 0607 10/16/20 0401  WBC 9.4 15.2* 13.6*  RBC 4.39 3.75* 3.66*  HGB 12.2* 10.3* 9.8*  HCT 38.4* 32.9* 32.2*  MCV 87.5 87.7 88.0  MCH 27.8 27.5 26.8  MCHC 31.8 31.3 30.4  RDW 16.6* 17.6* 17.4*  PLT 182 166 141*   Thyroid No results for input(s): TSH, FREET4 in the last 168 hours.  BNP Recent  Labs  Lab 10/14/20 1250  BNP 166.0*    DDimer  Recent Labs  Lab 10/14/20 1250  DDIMER 2.07*     Radiology/Studies:  CT Angio Chest Pulmonary Embolism (PE) W or WO Contrast  Result Date: 10/15/2020 CLINICAL DATA:  Shortness of breath, elevated D-dimer EXAM: CT ANGIOGRAPHY CHEST WITH CONTRAST TECHNIQUE: Multidetector CT imaging of the chest was performed using the standard protocol during bolus administration of intravenous contrast. Multiplanar CT image reconstructions and MIPs were obtained to evaluate the vascular anatomy. CONTRAST:  23mL OMNIPAQUE IOHEXOL 350 MG/ML SOLN COMPARISON:  06/23/2020  FINDINGS: Cardiovascular: Satisfactory opacification of the pulmonary arteries to the segmental level. No evidence of pulmonary embolism. Normal heart size. No pericardial effusion. Prior CABG. Thoracic aortic atherosclerosis. Mediastinum/Nodes: No enlarged mediastinal, hilar, or axillary lymph nodes. Thyroid gland and trachea demonstrate no significant findings. Esophageal resection with gastric pull-through with a large amount of food material within the neo-esophagus. Lungs/Pleura: Trace bilateral pleural effusion. Right middle lobe and right lower lobe airspace disease concerning for pneumonia. No pneumothorax. Upper Abdomen: Nonobstructing right renal calculus. Musculoskeletal: No chest wall abnormality. No acute or significant osseous findings. Review of the MIP images confirms the above findings. IMPRESSION: 1. No evidence of pulmonary embolus. 2. Esophageal resection with gastric pull-through with a large amount of food material within the neo-esophagus. 3. Right middle lobe and right lower lobe airspace disease concerning for pneumonia. 4.  Aortic Atherosclerosis (ICD10-I70.0). Electronically Signed   By: Kathreen Devoid M.D.   On: 10/15/2020 12:20   DG CHEST PORT 1 VIEW  Result Date: 10/15/2020 CLINICAL DATA:  Dyspnea, aspiration pneumonia, esophageal cancer EXAM: PORTABLE CHEST 1 VIEW COMPARISON:  10/14/2020 FINDINGS: Pulmonary insufflation is stable. There is progressive right basilar consolidation and increasing, small right pleural effusion noted. Surgical changes of gastric pull-up are identified. Left lung is clear. No pneumothorax. No pleural effusion on the left. Coronary artery bypass grafting has been performed. Pulmonary vascularity is normal. IMPRESSION: Progressive right basilar consolidation and enlarging, small right pleural effusion in keeping with a parapneumonic effusion. Electronically Signed   By: Fidela Salisbury M.D.   On: 10/15/2020 19:27   DG Chest Port 1 View  Result Date:  10/14/2020 CLINICAL DATA:  70 year old male with weakness, shortness of breath and nausea vomiting since 0500 hours. Query aspiration. EXAM: PORTABLE CHEST 1 VIEW COMPARISON:  Chest radiograph 07/11/2020 and earlier. CT 06/23/2020. FINDINGS: Portable AP upright view at 1050 hours. Prior CT demonstrates sequelae of esophagectomy and gastric pull-through. Superimposed CABG changes also noted. Chronic blunting of the right costophrenic angle, but increased patchy opacity there compared to July radiographs. No superimposed pneumothorax or pulmonary edema. Left lung appears stable and relatively clear. Visualized tracheal air column is within normal limits. No acute osseous abnormality identified. Paucity of bowel gas in the upper abdomen. IMPRESSION: Chronic blunting of the right lung base status post prior esophagectomy and gastric pull-through, but there is increased patchy opacity there since July suspicious for aspiration in this setting. Electronically Signed   By: Genevie Ann M.D.   On: 10/14/2020 11:04     Assessment and Plan:   1. Acute hypoxic respiratory failure felt secondary to aspiration pneumonia - abx, nebs, per IM - CT findings as above - ?whether he needs GI consult given esophageal hx and recurrent aspiration event- will defer to primary team  2. Elevated troponin - unclear if this represents possible NSTEMI vs demand ischemia, with known history of CAD s/p NSTEMI/CABG 05/2020 - patient reports left shoulder  symptoms are similar to prior angina, but also has had this in the context of prior shoulder surgery as well - EKG NSR/ST NSSTW changes - troponin value in 900 range is slightly higher than expected for demand process - agree with continuation of ASA, Plavix, BB, statin, and heparin per pharmacy (started 10/9) - await 2D echocardiogram and allow to improve from PNA - consider BB titration as below - will discuss further management with Dr. Gardiner Rhyme  3. Essential HTN - BP beginning to  creep up, consider increasing metoprolol back to pre-admission dosing (patient reported taking 50mg  BID per chart) - add baseline thyroid  4. Hyperlipidemia - last LDL 10/12/20 was 21 - continue rosuvastatin  5. Chronic anemia - pt reports previously followed at [facility] for iron infusions - follow CBC, add hemoccult - further per primary team  6. CKD stage IIIa - baseline Cr appears 1.0-1.3 - remains stable at this time   Risk Assessment/Risk Scores:     TIMI Risk Score for Unstable Angina or Non-ST Elevation MI:   The patient's TIMI risk score is 6, which indicates a 41% risk of all cause mortality, new or recurrent myocardial infarction or need for urgent revascularization in the next 14 days.     For questions or updates, please contact [facility] Please consult [email or web address] for contact info under    Signed, Charlie Pitter, PA-C  10/16/2020 9:03 AM   Patient seen and examined.  Agree with above documentation.  Mr. Norrington is a 70 year old male with a history of CAD status post CABG May 2022 (LIMA-LAD, SVG-OM, SVG-RCA), COPD, aspiration pneumonia, esophageal cancer status post partial esophagectomy with gastric pull-through, carotid stenosis, CKD stage IIIA, chronic anemia, rheumatic fever we are consulted by Dr. Carles Collet for evaluation of troponin elevation.  He was admitted in May 2022 with NSTEMI.  Anginal equivalent was shoulder pain.  Cath showed 90% ostial to distal left main stenosis, 90% mid LAD, 50% proximal LCx, 70% OM1, 50% ostial to proximal RCA, 70% mid RCA, 70% distal RCA.  Echocardiogram showed EF 60 to 65%.  Underwent CABG (LIMA-LAD, SVG-OM, SVG-distal RCA) on 06/02/2020.  He had an admission in June 2022 for aspiration pneumonia (this has been a chronic problem since his esophagectomy).  He presented to the [facility] ED on 10/8.  He reports he was coughing and felt himself aspirate.  Subsequently became short of breath.  EMS was called and initial SPO2 82% on room air.   Chest x-ray showed patchy opacity at right lung base suspicious for aspiration.  CTPA on 10/9 showed no evidence of PE, but did show right middle lobe/right lower lobe airspace disease concerning for pneumonia.  He reports that on the evening of 10/8 he began having left shoulder pain, similar to what he had prior to his MI.  Reports had pain throughout the day yesterday but resolved last night.  Has been hemodynamically stable, most recent BP 142/81, pulse 85, SPO2 97% on 2 L.  Labs notable for creatinine 1.27 > 1.1, potassium 3.8, sodium 138, WBC 9.4 > 15.2, BNP 166, procalcitonin 5.2, lactate 1.8, troponin 6 > 915 > 651 > 863.  He was started on heparin drip.  Currently pain-free.  On exam, patient is alert and oriented, regular rate and rhythm, no murmurs, crackles at R base, no LE edema or JVD.  EKG shows sinus rhythm, rate 82, nonspecific T wave flattening.  His presentation with shoulder pain, similar to what he had prior to his MI, and  troponin elevation concerning for NSTEMI versus demand ischemia in setting of his acute illness.  He is being treated for aspiration pneumonia.  He is currently pain-free.  Would continue heparin drip.  We will plan limited echo today.  If EF is reduced, would benefit from ischemic evaluation once infectious issues resolved.  Donato Heinz, MD

## 2020-10-16 NOTE — Plan of Care (Signed)

## 2020-10-16 NOTE — Progress Notes (Signed)
Date and time results received: 10/16/20 9826 (use smartphrase ".now" to insert current time)  Test: troponin Critical Value: 863  Name of Provider Notified: A[place]o  Orders Received? Or Actions Taken?:  NA

## 2020-10-16 NOTE — Progress Notes (Signed)
PROGRESS NOTE  James Wolf PTW:[id number] DOB: 1950-05-02 DOA: 10/14/2020 PCP: Sherrilee Gilles, DO  Brief History:   70 y.o. 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  About one hour later, he had one episde of n/v.  Patient denies fevers, chills, headache, chest pain, nausea, vomiting, diarrhea, abdominal pain, dysuria, hematuria, hematochezia, and melena.  He has a nonproductive cough. Patient states that he had been in his usual state of health this past week.  He denies any worsening reflux symptoms or any aspiration events in the past week.  He states that he normally sleeps in almost a seated position because of his recurrent issues with his esophagectomy and aspiration.  He denies any new medications.  The patient was recently admitted to the hospital from 06/23/2020 to 06/25/2020 for aspiration pneumonia.  He was discharged home with Augmentin after being treated with Unasyn. From EMS was activated, they noted the patient have oxygen saturation of 82% on room air.  The patient was placed on 2 L with oxygen saturation into the mid 90s.   ED -Patient was afebrile and hemodynamically stable he was tachypneic with oxygen saturation 95% on 2 L.  BMP showed sodium 138, potassium 3.8, serum creatinine 1.27 from, AST 53, ALT 44, alk phosphatase 47, total bilirubin 0.5.  Chest x-ray showed chronic interstitial markings with increasing right lower lobe opacity. WBC 9.4, hemoglobin 12.2, platelets 182,000.  The patient was given a dose of Unasyn and admission was requested   Assessment/Plan:   Acute respiratory failure with hypoxia -Secondary to aspiration pneumonia -Presently  stable on 2 L nasal cannula -Wean oxygen back to room air -Check D-dimer-2.07 -PCT 5.24 -Continue duo nebs -still sob with minimal exertion -CTA chest--NEG PE; RML and RLL air space opacities   Aspiration pneumonia -Continue Unasyn>>zosyn -continue doxycycline  Elevated Troponin -Demand ischemia vs NSTEMI -troponin 915>>651>>863 -10/10 Echo EF 55-60%, no WMA -continue IV heparin -personally reviewed EKG-sinus, TWI V1, V2 -continue ASA, plavix, metoprolol, statin   GERD -Continue PPI   Coronary artery disease -S/p CABG x3 06/02/2020 -Continue aspirin Plavix -No chest pain but complains of left shoulder burning -Continue statin -Continue metoprolol tartrate -troponin 915>>651>>863   Essential hypertension -restart metoprolol  -previously on telmisartan which has been discontinued   Impaired glucose tolerance -06/01/2020 hemoglobin A1c 6.2   Dyslipidemia -Continue statin -LDL 99>>21   LUL lung nodule -Continue outpatient surveillance   CKD stage IIIa -Baseline creatinine 1.0-1.3   Thrombocytopenia -due to acute medical illness     Status is: Inpatient   The patient will require care spanning > 2 midnights and should be moved to inpatient because: Inpatient level of care appropriate due to severity of illness   Dispo: The patient is from: Home              Anticipated d/c is to: Home              Patient currently is not medically stable to d/c.              Difficult to place patient No               Family Communication:   spouse updated at bedside  10/10   Consultants:  none   Code Status:  FULL    DVT Prophylaxis:  Adel Lovenox     Procedures: As Listed in Progress Note Above   Antibiotics: Unasyn 10/8>>10/9 Zosyn 10/9>> Doxy 10/8>>   Subjective: Patient has intermittent left shoulder ache--better this am.  Denies f/c, cp, n/v/d.  Sob is improving  Objective: Vitals:   10/16/20 0801 10/16/20 0825 10/16/20 1414 10/16/20 1502  BP:  (!) 142/81  124/78   Pulse: 85  98   Resp: 20  20   Temp: 98.7 F (37.1 C)  99.3 F (37.4 C)   TempSrc: Oral  Oral   SpO2: 97% 96% 95% 95%  Weight:      Height:        Intake/Output Summary (Last 24 hours) at 10/16/2020 1718 Last data filed at 10/16/2020 1500 Gross per 24 hour  Intake 3267.58 ml  Output 200 ml  Net 3067.58 ml   Weight change:  Exam:  General:  Pt is alert, follows commands appropriately, not in acute distress HEENT: No icterus, No thrush, No neck mass, [place]/AT Cardiovascular: RRR, S1/S2, no rubs, no gallops Respiratory: R>L basilar crackles. No wheeze Abdomen: Soft/+BS, non tender, non distended, no guarding Extremities: No edema, No lymphangitis, No petechiae, No rashes, no synovitis   Data Reviewed: I have personally reviewed following labs and imaging studies Basic Metabolic Panel: Recent Labs  Lab 10/14/20 1037 10/15/20 0607 10/16/20 0401  NA 138 140 139  K 3.8 4.0 4.2  CL 106 112* 111  CO2 _0 GLUCOSE 174* 119* 116*  BUN _1 CREATININE 1.27* 1.12 1.07  CALCIUM 8.6* 7.7* 8.1*  MG  --  1.6*  --    Liver Function Tests: Recent Labs  Lab 10/14/20 1037  AST 53*  ALT 44  ALKPHOS 47  BILITOT 0.5  PROT 7.3  ALBUMIN 3.7   No results for input(s): LIPASE, AMYLASE in the last 168 hours. No results for input(s): AMMONIA in the last 168 hours. Coagulation Profile: No results for input(s): INR, PROTIME in the last 168 hours. CBC: Recent Labs  Lab 10/14/20 1037 10/15/20 0607 10/16/20 0401  WBC 9.4 15.2* 13.6*  NEUTROABS 8.4*  --   --   HGB 12.2* 10.3* 9.8*  HCT 38.4* 32.9* 32.2*  MCV 87.5 87.7 88.0  PLT 182 166 141*   Cardiac Enzymes: No results for input(s): CKTOTAL, CKMB, CKMBINDEX, TROPONINI in the last 168 hours. BNP: Invalid input(s): POCBNP CBG: No results for input(s): GLUCAP in the last 168 hours. HbA1C: No results for input(s): HGBA1C in the last 72 hours. Urine analysis:    Component Value Date/Time    COLORURINE YELLOW 06/01/2020 1212   APPEARANCEUR HAZY (A) 06/01/2020 1212   LABSPEC 1.011 06/01/2020 1212   PHURINE 6.0 06/01/2020 1212   GLUCOSEU NEGATIVE 06/01/2020 1212   HGBUR NEGATIVE 06/01/2020 1212   BILIRUBINUR NEGATIVE 06/01/2020 1212   KETONESUR 5 (A) 06/01/2020 1212   PROTEINUR NEGATIVE 06/01/2020 1212   NITRITE NEGATIVE 06/01/2020 1212   [place] 06/01/2020 1212   Sepsis Labs: _2 (procalcitonin:4,lacticidven:4) ) Recent Results (from the past 240 hour(s))  Resp Panel by RT-PCR (Flu A&B, Covid) Nasopharyngeal Swab     Status: None   Collection Time: 10/14/20 11:40 AM   Specimen: Nasopharyngeal Swab; Nasopharyngeal(NP) swabs in vial transport medium  Result Value Ref Range Status   SARS Coronavirus 2 by RT PCR NEGATIVE NEGATIVE Final    Comment: (NOTE) SARS-CoV-2 target nucleic acids  are NOT DETECTED.  The SARS-CoV-2 RNA is generally detectable in upper respiratory specimens during the acute phase of infection. The lowest concentration of SARS-CoV-2 viral copies this assay can detect is 138 copies/mL. A negative result does not preclude SARS-Cov-2 infection and should not be used as the sole basis for treatment or other patient management decisions. A negative result may occur with  improper specimen collection/handling, submission of specimen other than nasopharyngeal swab, presence of viral mutation(s) within the areas targeted by this assay, and inadequate number of viral copies(<138 copies/mL). A negative result must be combined with clinical observations, patient history, and epidemiological information. The expected result is Negative.  Fact Sheet for Patients:  EntrepreneurPulse.com.au  Fact Sheet for Healthcare Providers:  IncredibleEmployment.be  This test is no t yet approved or cleared by the [place] FDA and  has been authorized for detection and/or diagnosis of SARS-CoV-2 by FDA under an  Emergency Use Authorization (EUA). This EUA will remain  in effect (meaning this test can be used) for the duration of the COVID-19 declaration under Section 564(b)(1) of the Act, 21 U.S.C.section 360bbb-3(b)(1), unless the authorization is terminated  or revoked sooner.       Influenza A by PCR NEGATIVE NEGATIVE Final   Influenza B by PCR NEGATIVE NEGATIVE Final    Comment: (NOTE) The Xpert Xpress SARS-CoV-2/FLU/RSV plus assay is intended as an aid in the diagnosis of influenza from Nasopharyngeal swab specimens and should not be used as a sole basis for treatment. Nasal washings and aspirates are unacceptable for Xpert Xpress SARS-CoV-2/FLU/RSV testing.  Fact Sheet for Patients: EntrepreneurPulse.com.au  Fact Sheet for Healthcare Providers: IncredibleEmployment.be  This test is not yet approved or cleared by the [place] FDA and has been authorized for detection and/or diagnosis of SARS-CoV-2 by FDA under an Emergency Use Authorization (EUA). This EUA will remain in effect (meaning this test can be used) for the duration of the COVID-19 declaration under Section 564(b)(1) of the Act, 21 U.S.C. section 360bbb-3(b)(1), unless the authorization is terminated or revoked.  Performed at [facility], [address], [place],  [address]      Scheduled Meds:  aspirin EC  81 mg Oral Daily   B-complex with vitamin C  1 tablet Oral Daily   cholecalciferol  1,000 Units Oral Daily   clopidogrel  75 mg Oral Daily   diclofenac Sodium  2 g Topical QID   ezetimibe  10 mg Oral Daily   guaiFENesin  1,200 mg Oral BID   ipratropium-albuterol  3 mL Nebulization Once   ipratropium-albuterol  3 mL Nebulization TID   metoprolol tartrate  25 mg Oral BID   pantoprazole  40 mg Oral BID   rosuvastatin  40 mg Oral q1800   Continuous Infusions:  sodium chloride 10 mL/hr at 10/14/20 1757   0.9 % NaCl with KCl 20 mEq / L 75 mL/hr at 10/16/20 1122    doxycycline (VIBRAMYCIN) IV 100 mg (10/16/20 0535)   heparin 1,350 Units/hr (10/16/20 1642)   piperacillin-tazobactam (ZOSYN)  IV 3.375 g (10/16/20 1121)    Procedures/Studies: DG Shoulder Right  Result Date: 09/20/2020 Right shoulder x-ray pain for 32 years Mild narrowing of the right shoulder glenohumeral joint sclerosis of the rotator cuff insertion for mild AC joint narrowing Impression mild arthritis right shoulder mild AC joint arthritis  CT Angio Chest Pulmonary Embolism (PE) W or WO Contrast  Result Date: 10/15/2020 CLINICAL DATA:  Shortness of breath, elevated D-dimer EXAM: CT ANGIOGRAPHY CHEST WITH CONTRAST  TECHNIQUE: Multidetector CT imaging of the chest was performed using the standard protocol during bolus administration of intravenous contrast. Multiplanar CT image reconstructions and MIPs were obtained to evaluate the vascular anatomy. CONTRAST:  8m OMNIPAQUE IOHEXOL 350 MG/ML SOLN COMPARISON:  06/23/2020 FINDINGS: Cardiovascular: Satisfactory opacification of the pulmonary arteries to the segmental level. No evidence of pulmonary embolism. Normal heart size. No pericardial effusion. Prior CABG. Thoracic aortic atherosclerosis. Mediastinum/Nodes: No enlarged mediastinal, hilar, or axillary lymph nodes. Thyroid gland and trachea demonstrate no significant findings. Esophageal resection with gastric pull-through with a large amount of food material within the neo-esophagus. Lungs/Pleura: Trace bilateral pleural effusion. Right middle lobe and right lower lobe airspace disease concerning for pneumonia. No pneumothorax. Upper Abdomen: Nonobstructing right renal calculus. Musculoskeletal: No chest wall abnormality. No acute or significant osseous findings. Review of the MIP images confirms the above findings. IMPRESSION: 1. No evidence of pulmonary embolus. 2. Esophageal resection with gastric pull-through with a large amount of food material within the neo-esophagus. 3. Right middle lobe and  right lower lobe airspace disease concerning for pneumonia. 4.  Aortic Atherosclerosis (ICD10-I70.0). Electronically Signed   By: HKathreen DevoidM.D.   On: 10/15/2020 12:20   DG CHEST PORT 1 VIEW  Result Date: 10/15/2020 CLINICAL DATA:  Dyspnea, aspiration pneumonia, esophageal cancer EXAM: PORTABLE CHEST 1 VIEW COMPARISON:  10/14/2020 FINDINGS: Pulmonary insufflation is stable. There is progressive right basilar consolidation and increasing, small right pleural effusion noted. Surgical changes of gastric pull-up are identified. Left lung is clear. No pneumothorax. No pleural effusion on the left. Coronary artery bypass grafting has been performed. Pulmonary vascularity is normal. IMPRESSION: Progressive right basilar consolidation and enlarging, small right pleural effusion in keeping with a parapneumonic effusion. Electronically Signed   By: AFidela SalisburyM.D.   On: 10/15/2020 19:27   DG Chest Port 1 View  Result Date: 10/14/2020 CLINICAL DATA:  70year old male with weakness, shortness of breath and nausea vomiting since 0500 hours. Query aspiration. EXAM: PORTABLE CHEST 1 VIEW COMPARISON:  Chest radiograph 07/11/2020 and earlier. CT 06/23/2020. FINDINGS: Portable AP upright view at 1050 hours. Prior CT demonstrates sequelae of esophagectomy and gastric pull-through. Superimposed CABG changes also noted. Chronic blunting of the right costophrenic angle, but increased patchy opacity there compared to July radiographs. No superimposed pneumothorax or pulmonary edema. Left lung appears stable and relatively clear. Visualized tracheal air column is within normal limits. No acute osseous abnormality identified. Paucity of bowel gas in the upper abdomen. IMPRESSION: Chronic blunting of the right lung base status post prior esophagectomy and gastric pull-through, but there is increased patchy opacity there since July suspicious for aspiration in this setting. Electronically Signed   By: HGenevie AnnM.D.   On:  10/14/2020 11:04   ECHOCARDIOGRAM LIMITED  Result Date: 10/16/2020    ECHOCARDIOGRAM LIMITED REPORT   Patient Name:   James BOATENGDate of Exam: 10/16/2020 Medical Rec #:  [id number]       Height:       65.0 in Accession #:    [id number]      Weight:       168.0 lb Date of Birth:  6February 12, 1952       BSA:          1.837 m Patient Age:    89years         BP:           142/81 mmHg Patient Gender: M  HR:           82 bpm. Exam Location:  [facility] Procedure: Limited Echo Indications:    NSTEMI I21.4  History:        Patient has prior history of Echocardiogram examinations, most                 recent 06/01/2019. CAD and Previous Myocardial Infarction, Prior                 CABG, COPD; Risk Factors:Hypertension, Dyslipidemia and Former                 Smoker.  Sonographer:    Alvino Chapel RCS Referring Phys: [phone] Westen Dinino IMPRESSIONS  1. Left ventricular ejection fraction, by estimation, is 55 to 60%. The left ventricle has normal function. The left ventricle has no regional wall motion abnormalities. There is moderate asymmetric left ventricular hypertrophy of the basal-septal segment.  2. Right ventricular systolic function is normal. The right ventricular size is normal.  3. The inferior vena cava is dilated in size with >50% respiratory variability, suggesting right atrial pressure of 8 mmHg. FINDINGS  Left Ventricle: Left ventricular ejection fraction, by estimation, is 55 to 60%. The left ventricle has normal function. The left ventricle has no regional wall motion abnormalities. The left ventricular internal cavity size was normal in size. There is  moderate asymmetric left ventricular hypertrophy of the basal-septal segment. Right Ventricle: The right ventricular size is normal. No increase in right ventricular wall thickness. Right ventricular systolic function is normal. Pericardium: There is no evidence of pericardial effusion. Aorta: The aortic root is normal in size and structure.  Venous: The inferior vena cava is dilated in size with greater than 50% respiratory variability, suggesting right atrial pressure of 8 mmHg. LEFT VENTRICLE PLAX 2D LVIDd:         4.50 cm LVIDs:         3.30 cm LV PW:         0.80 cm LV IVS:        0.90 cm  LV Volumes (MOD) LV vol d, MOD A2C: 93.6 ml LV vol d, MOD A4C: 116.0 ml LV vol s, MOD A2C: 46.1 ml LV vol s, MOD A4C: 56.9 ml LV SV MOD A2C:     47.5 ml LV SV MOD A4C:     116.0 ml LV SV MOD BP:      53.3 ml IVC IVC diam: 2.10 cm LEFT ATRIUM         Index LA diam:    3.80 cm 2.07 cm/m   AORTA Ao Root diam: 3.20 cm Oswaldo Milian MD Electronically signed by Oswaldo Milian MD Signature Date/Time: 10/16/2020/12:13:41 PM    Final     Orson Eva, DO  [facility]  If 7PM-7AM, please contact night-coverage [email or web address] Password [email or web address] 10/16/2020, 5:18 PM   LOS: 1 day

## 2020-10-16 NOTE — Progress Notes (Signed)
ANTICOAGULATION CONSULT NOTE - Initial Consult  Pharmacy Consult for Heparin Indication: chest pain/ACS  Allergies  Allergen Reactions   Zithromax [Azithromycin] Diarrhea    Causes pt's stomach to "tear up" does not want to take medication again.     Patient Measurements: Height: 5\' 5"  (165.1 cm) Weight: 76.2 kg (168 lb) IBW/kg (Calculated) : 61.5 HEPARIN DW (KG): 76.2  Vital Signs: Temp: 99.3 F (37.4 C) (10/10 1414) Temp Source: Oral (10/10 1414) BP: 124/78 (10/10 1414) Pulse Rate: 98 (10/10 1414)  Labs: Recent Labs    10/14/20 1037 10/14/20 1250 10/15/20 0607 10/15/20 1003 10/15/20 1813 10/16/20 0401 10/16/20 1504  HGB 12.2*  --  10.3*  --   --  9.8*  --   HCT 38.4*  --  32.9*  --   --  32.2*  --   PLT 182  --  166  --   --  141*  --   HEPARINUNFRC  --   --   --   --  0.29*  --  0.17*  CREATININE 1.27*  --  1.12  --   --  1.07  --   TROPONINIHS 5   < > 915* 651*  --  863*  --    < > = values in this interval not displayed.     Estimated Creatinine Clearance: 62.1 mL/min (by C-G formula based on SCr of 1.07 mg/dL).   Medical History: Past Medical History:  Diagnosis Date   Anemia    Aspiration pneumonia (HCC)    CAD (coronary artery disease)    a. s/p CABG 05/2020.   Cancer [facility])    esophageal cancer 1995   Carotid artery disease (HCC)    Chronic kidney disease, stage 3a (HCC)    COPD (chronic obstructive pulmonary disease) (HCC)    GERD (gastroesophageal reflux disease)    History of kidney stones    Hyperlipidemia    Hypertension    Joint pain    Lung nodule    Osteomyelitis (HCC)    Osteoporosis    Rheumatic fever    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   LIMA to LAD, SVG to OM, SVG to RCA    Medications:  Medications Prior to Admission  Medication Sig Dispense Refill Last Dose   albuterol (VENTOLIN HFA) 108 (90 Base) MCG/ACT inhaler Inhale 1-2 puffs into the lungs every 6 (six) hours as needed for wheezing or shortness of breath.   Past Month    aspirin EC 81 MG EC tablet Take 1 tablet (81 mg total) by mouth daily. Swallow whole. 30 tablet 11 10/13/2020   B Complex Vitamins (VITAMIN B COMPLEX PO) Take 1 tablet by mouth daily.    10/13/2020   cholecalciferol (VITAMIN D3) 25 MCG (1000 UNIT) tablet Take 1,000 Units by mouth daily.   10/13/2020   clopidogrel (PLAVIX) 75 MG tablet Take 1 tablet (75 mg total) by mouth daily. 30 tablet 11 10/13/2020 at 0900   diclofenac sodium (VOLTAREN) 1 % GEL Apply 2 g topically 2 (two) times daily as needed (pain).    10/13/2020   esomeprazole (NEXIUM) 40 MG capsule Take 40 mg by mouth 2 (two) times daily.    10/13/2020   ezetimibe (ZETIA) 10 MG tablet Take 1 tablet (10 mg total) by mouth daily. 90 tablet 1 10/13/2020   metoprolol tartrate (LOPRESSOR) 100 MG tablet Take 1 tablet (100 mg total) by mouth 2 (two) times daily. (Patient taking differently: Take 50 mg by mouth 2 (two) times daily.)  60 tablet 3 10/13/2020 at 1830   pentoxifylline (TRENTAL) 400 MG CR tablet Take 400 mg by mouth 2 (two) times daily.   10/13/2020   rosuvastatin (CRESTOR) 40 MG tablet Take 1 tablet (40 mg total) by mouth daily at 6 PM. 30 tablet 3 10/13/2020   tamsulosin (FLOMAX) 0.4 MG CAPS capsule Take 0.4 mg by mouth daily.   10/13/2020   chlorhexidine (PERIDEX) 0.12 % solution SMARTSIG:By Mouth (Patient not taking: No sig reported)   Not Taking    Assessment: No anticoagulants PTA.  Asked to initiate Heparin for ACS.   10/9 HL 0.17,  subtherapeutic   Goal of Therapy:  Heparin level 0.3-0.7 units/ml Monitor platelets by anticoagulation protocol: Yes   Plan:  Heparin bolus 2000 units iv x 1  Increase heparin infusion to 1350 units/hr Check HL in ~ 6 hours Monitor H/H, PLTs  James Wolf James Wolf 10/16/2020,4:26 PM

## 2020-10-16 NOTE — Progress Notes (Signed)
ANTICOAGULATION CONSULT NOTE  Pharmacy Consult for Heparin Indication: chest pain/ACS  Allergies  Allergen Reactions   Zithromax [Azithromycin] Diarrhea    Causes pt's stomach to "tear up" does not want to take medication again.     Patient Measurements: Height: 5\' 5"  (165.1 cm) Weight: 76.2 kg (168 lb) IBW/kg (Calculated) : 61.5 HEPARIN DW (KG): 76.2  Vital Signs: Temp: 98.7 F (37.1 C) (10/10 2007) Temp Source: Oral (10/10 2007) BP: 148/81 (10/10 2007) Pulse Rate: 100 (10/10 2007)  Labs: Recent Labs    10/14/20 1037 10/14/20 1250 10/15/20 0607 10/15/20 1003 10/15/20 1813 10/16/20 0401 10/16/20 1504 10/16/20 2330  HGB 12.2*  --  10.3*  --   --  9.8*  --   --   HCT 38.4*  --  32.9*  --   --  32.2*  --   --   PLT 182  --  166  --   --  141*  --   --   HEPARINUNFRC  --   --   --   --  0.29*  --  0.17* 0.37  CREATININE 1.27*  --  1.12  --   --  1.07  --   --   TROPONINIHS 5   < > 915* 651*  --  863*  --   --    < > = values in this interval not displayed.     Estimated Creatinine Clearance: 62.1 mL/min (by C-G formula based on SCr of 1.07 mg/dL).   Medical History: Past Medical History:  Diagnosis Date   Anemia    Aspiration pneumonia (HCC)    CAD (coronary artery disease)    a. s/p CABG 05/2020.   Cancer [facility])    esophageal cancer 1995   Carotid artery disease (HCC)    Chronic kidney disease, stage 3a (HCC)    COPD (chronic obstructive pulmonary disease) (HCC)    GERD (gastroesophageal reflux disease)    History of kidney stones    Hyperlipidemia    Hypertension    Joint pain    Lung nodule    Osteomyelitis (HCC)    Osteoporosis    Rheumatic fever    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   LIMA to LAD, SVG to OM, SVG to RCA    Medications:  Medications Prior to Admission  Medication Sig Dispense Refill Last Dose   albuterol (VENTOLIN HFA) 108 (90 Base) MCG/ACT inhaler Inhale 1-2 puffs into the lungs every 6 (six) hours as needed for wheezing or shortness of  breath.   Past Month   aspirin EC 81 MG EC tablet Take 1 tablet (81 mg total) by mouth daily. Swallow whole. 30 tablet 11 10/13/2020   B Complex Vitamins (VITAMIN B COMPLEX PO) Take 1 tablet by mouth daily.    10/13/2020   cholecalciferol (VITAMIN D3) 25 MCG (1000 UNIT) tablet Take 1,000 Units by mouth daily.   10/13/2020   clopidogrel (PLAVIX) 75 MG tablet Take 1 tablet (75 mg total) by mouth daily. 30 tablet 11 10/13/2020 at 0900   diclofenac sodium (VOLTAREN) 1 % GEL Apply 2 g topically 2 (two) times daily as needed (pain).    10/13/2020   esomeprazole (NEXIUM) 40 MG capsule Take 40 mg by mouth 2 (two) times daily.    10/13/2020   ezetimibe (ZETIA) 10 MG tablet Take 1 tablet (10 mg total) by mouth daily. 90 tablet 1 10/13/2020   metoprolol tartrate (LOPRESSOR) 100 MG tablet Take 1 tablet (100 mg total) by mouth 2 (  two) times daily. (Patient taking differently: Take 50 mg by mouth 2 (two) times daily.) 60 tablet 3 10/13/2020 at 1830   pentoxifylline (TRENTAL) 400 MG CR tablet Take 400 mg by mouth 2 (two) times daily.   10/13/2020   rosuvastatin (CRESTOR) 40 MG tablet Take 1 tablet (40 mg total) by mouth daily at 6 PM. 30 tablet 3 10/13/2020   tamsulosin (FLOMAX) 0.4 MG CAPS capsule Take 0.4 mg by mouth daily.   10/13/2020   chlorhexidine (PERIDEX) 0.12 % solution SMARTSIG:By Mouth (Patient not taking: No sig reported)   Not Taking    Assessment: No anticoagulants PTA.  Asked to initiate Heparin for ACS.    10/10 PM update:  Heparin level therapeutic after rate increase  Goal of Therapy:  Heparin level 0.3-0.7 units/ml Monitor platelets by anticoagulation protocol: Yes   Plan:  Continue heparin at 1350 units/hr Confirmatory heparin level with AM labs  Narda Bonds, PharmD, [place] Pharmacist Phone: [phone]

## 2020-10-16 NOTE — TOC Initial Note (Signed)
Transition of Care [facility]) - Initial/Assessment Note    Patient Details  Name: James Wolf MRN: [id number] Date of Birth: [birth year]  Transition of Care [facility]) CM/SW Contact:    Salome Arnt, [place] Phone Number: 10/16/2020, 8:51 AM  Clinical Narrative:  Pt admitted with acute respiratory failure with hypoxia. TOC completed assessment due to high risk readmission score. Pt reports he lives with his wife and is independent with ADLs. He drives himself to appointments. No needs reported at this time. Pt plans to return home when medically stable. TOC will continue to follow.                  Expected Discharge Plan: Home/Self Care Barriers to Discharge: Continued Medical Work up   Patient Goals and CMS Choice Patient states their goals for this hospitalization and ongoing recovery are:: return home   Choice offered to / list presented to : Patient  Expected Discharge Plan and Services Expected Discharge Plan: Home/Self Care In-house Referral: Clinical Social Work     Living arrangements for the past 2 months: Single Family Home                 DME Arranged: N/A                    Prior Living Arrangements/Services Living arrangements for the past 2 months: Single Family Home Lives with:: Spouse Patient language and need for interpreter reviewed:: Yes Do you feel safe going back to the place where you live?: Yes      Need for Family Participation in Patient Care: No (Comment)     Criminal Activity/Legal Involvement Pertinent to Current Situation/Hospitalization: No - Comment as needed  Activities of Daily Living Home Assistive Devices/Equipment: None ADL Screening (condition at time of admission) Patient's cognitive ability adequate to safely complete daily activities?: Yes Is the patient deaf or have difficulty hearing?: No Does the patient have difficulty seeing, even when wearing glasses/contacts?: No Does the patient have difficulty concentrating,  remembering, or making decisions?: No Patient able to express need for assistance with ADLs?: Yes Does the patient have difficulty dressing or bathing?: No Independently performs ADLs?: Yes (appropriate for developmental age) Does the patient have difficulty walking or climbing stairs?: Yes Weakness of Legs: Both Weakness of Arms/Hands: Both  Permission Sought/Granted                  Emotional Assessment   Attitude/Demeanor/Rapport: Engaged Affect (typically observed): Accepting Orientation: : Oriented to Self, Oriented to Place, Oriented to  Time, Oriented to Situation Alcohol / Substance Use: Not Applicable Psych Involvement: No (comment)  Admission diagnosis:  Aspiration pneumonia ([place]) [J69.0] Aspiration pneumonia of right lower lobe due to gastric secretions (HCC) [J69.0] Acute respiratory failure with hypoxia (HCC) [J96.01] Patient Active Problem List   Diagnosis Date Noted   Anemia 09/19/2020   At risk for falls 09/19/2020   Left shoulder pain 09/19/2020   Kidney stone 09/19/2020   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   Osteoporosis 09/19/2020   Former smoker 09/19/2020   Left upper lobe pulmonary nodule 06/24/2020   Prolonged QT interval 06/24/2020   Aspiration pneumonia ([place]) 06/24/2020   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   Left main coronary artery disease 05/31/2020   Hyperlipidemia with target LDL less than 70 05/31/2020   Leukocytosis 05/30/2020   Hypokalemia 05/30/2020   GERD (gastroesophageal reflux disease) 05/30/2020   Coronary artery disease involving native coronary artery of native heart with unstable angina pectoris (  [place]) 05/30/2020   Non-ST elevation (NSTEMI) myocardial infarction [facility])    Chest pain 05/29/2020   S/P left rotator cuff repair 10/26/19 11/09/2019   Nontraumatic incomplete tear of left rotator cuff    Essential hypertension 07/19/2017   COPD (chronic obstructive pulmonary disease) ([place]) 07/19/2017   History of esophageal cancer 07/19/2017   Acute  respiratory failure with hypoxia ([place]) 07/19/2017   Aspiration pneumonitis ([place]) 07/19/2017   Syncope 07/19/2017   IGT (impaired glucose tolerance) 12/21/2013   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

## 2020-10-16 NOTE — Progress Notes (Signed)
Patient/wife inquired about Trental rx. Cannot find this listed in any of our records or OP [facility] records. No known hx of interventional PAD, only mild by prior pre-CABG studies. Will discontinue.

## 2020-10-16 NOTE — Progress Notes (Signed)
ANTICOAGULATION CONSULT NOTE - Initial Consult  Pharmacy Consult for Heparin Indication: chest pain/ACS  Allergies  Allergen Reactions   Zithromax [Azithromycin] Diarrhea    Causes pt's stomach to "tear up" does not want to take medication again.     Patient Measurements: Height: 5\' 5"  (165.1 cm) Weight: 76.2 kg (168 lb) IBW/kg (Calculated) : 61.5 HEPARIN DW (KG): 76.2  Vital Signs: Temp: 98.9 F (37.2 C) (10/10 0508) Temp Source: Oral (10/10 0508) BP: 140/75 (10/10 0508) Pulse Rate: 86 (10/10 0508)  Labs: Recent Labs    10/14/20 1037 10/14/20 1250 10/15/20 0607 10/15/20 1003 10/15/20 1813 10/16/20 0401  HGB 12.2*  --  10.3*  --   --  9.8*  HCT 38.4*  --  32.9*  --   --  32.2*  PLT 182  --  166  --   --  141*  HEPARINUNFRC  --   --   --   --  0.29*  --   CREATININE 1.27*  --  1.12  --   --  1.07  TROPONINIHS 5   < > 915* 651*  --  863*   < > = values in this interval not displayed.     Estimated Creatinine Clearance: 62.1 mL/min (by C-G formula based on SCr of 1.07 mg/dL).   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   Past Month   aspirin EC 81 MG EC tablet Take 1 tablet (81 mg total) by mouth daily. Swallow whole. 30 tablet 11 10/13/2020   B Complex Vitamins (VITAMIN B COMPLEX PO) Take 1 tablet by mouth daily.     10/13/2020   cholecalciferol (VITAMIN D3) 25 MCG (1000 UNIT) tablet Take 1,000 Units by mouth daily.   10/13/2020   clopidogrel (PLAVIX) 75 MG tablet Take 1 tablet (75 mg total) by mouth daily. 30 tablet 11 10/13/2020 at 0900   diclofenac sodium (VOLTAREN) 1 % GEL Apply 2 g topically 2 (two) times daily as needed (pain).    10/13/2020   esomeprazole (NEXIUM) 40 MG capsule Take 40 mg by mouth 2 (two) times daily.    10/13/2020   ezetimibe (ZETIA) 10 MG tablet Take 1 tablet (10 mg total) by mouth daily. 90 tablet 1 10/13/2020   metoprolol tartrate (LOPRESSOR) 100 MG tablet Take 1 tablet (100 mg total) by mouth 2 (two) times daily. (Patient taking differently: Take 50 mg by mouth 2 (two) times daily.) 60 tablet 3 10/13/2020 at 1830   pentoxifylline (TRENTAL) 400 MG CR tablet Take 400 mg by mouth 2 (two) times daily.   10/13/2020   rosuvastatin (CRESTOR) 40 MG tablet Take 1 tablet (40 mg total) by mouth daily at 6 PM. 30 tablet  3 10/13/2020   tamsulosin (FLOMAX) 0.4 MG CAPS capsule Take 0.4 mg by mouth daily.   10/13/2020   chlorhexidine (PERIDEX) 0.12 % solution SMARTSIG:By Mouth (Patient not taking: No sig reported)   Not Taking    Assessment: No anticoagulants PTA.  Asked to initiate Heparin for ACS.   10/9 HL 0.29, slightly subtherapeutic   Goal of Therapy:  Heparin level 0.3-0.7 units/ml Monitor platelets by anticoagulation protocol: Yes   Plan:  Increase heparin infusion to 1100 units/hr Check HL in ~ 6 hours Monitor H/H, PLTs  Carleena Mires 10/16/2020,7:43 AM

## 2020-10-16 NOTE — Progress Notes (Signed)
*  PRELIMINARY RESULTS* Echocardiogram Limited 2-D Echocardiogram  has been performed.  James Wolf 10/16/2020, 11:22 AM

## 2020-10-17 ENCOUNTER — Encounter: Payer: Self-pay | Admitting: *Deleted

## 2020-10-17 LAB — BASIC METABOLIC PANEL
Anion gap: 6 (ref 5–15)
BUN: 11 mg/dL (ref 8–23)
CO2: 21 mmol/L — ABNORMAL LOW (ref 22–32)
Calcium: 8.3 mg/dL — ABNORMAL LOW (ref 8.9–10.3)
Chloride: 109 mmol/L (ref 98–111)
Creatinine, Ser: 1.17 mg/dL (ref 0.61–1.24)
GFR, Estimated: >60 mL/min (ref >60)
Glucose, Bld: 110 mg/dL — ABNORMAL HIGH (ref 70–99)
Potassium: 3.9 mmol/L (ref 3.5–5.1)
Sodium: 136 mmol/L (ref 135–145)

## 2020-10-17 LAB — CBC
HCT: 29.3 % — ABNORMAL LOW (ref 39.0–52.0)
Hemoglobin: 9.1 g/dL — ABNORMAL LOW (ref 13.0–17.0)
MCH: 26.8 pg (ref 26.0–34.0)
MCHC: 31.1 g/dL (ref 30.0–36.0)
MCV: 86.2 fL (ref 80.0–100.0)
Platelets: 147 10*3/uL — ABNORMAL LOW (ref 150–400)
RBC: 3.4 MIL/uL — ABNORMAL LOW (ref 4.22–5.81)
RDW: 17.3 % — ABNORMAL HIGH (ref 11.5–15.5)
WBC: 10.2 10*3/uL (ref 4.0–10.5)
nRBC: 0 % (ref 0.0–0.2)

## 2020-10-17 LAB — TSH: TSH: 3.663 u[IU]/mL (ref 0.350–4.500)

## 2020-10-17 LAB — OCCULT BLOOD X 1 CARD TO LAB, STOOL: Fecal Occult Bld: POSITIVE — AB

## 2020-10-17 LAB — HEPARIN LEVEL (UNFRACTIONATED): Heparin Unfractionated: 0.37 IU/mL (ref 0.30–0.70)

## 2020-10-17 MED ORDER — DOXYCYCLINE HYCLATE 100 MG PO TABS: 100.0000 mg | ORAL_TABLET | Freq: Two times a day (BID) | ORAL | 0 refills | Status: DC

## 2020-10-17 MED ORDER — AMOXICILLIN-POT CLAVULANATE 875-125 MG PO TABS: 1.0000 | ORAL_TABLET | Freq: Two times a day (BID) | ORAL | 0 refills | Status: DC

## 2020-10-17 NOTE — Progress Notes (Signed)
ANTICOAGULATION CONSULT NOTE  Pharmacy Consult for Heparin Indication: chest pain/ACS  Allergies  Allergen Reactions   Zithromax [Azithromycin] Diarrhea    Causes pt's stomach to "tear up" does not want to take medication again.     Patient Measurements: Height: 5\' 5"  (165.1 cm) Weight: 76.2 kg (168 lb) IBW/kg (Calculated) : 61.5 HEPARIN DW (KG): 76.2  Vital Signs: Temp: 98.4 F (36.9 C) (10/11 0257) Temp Source: Oral (10/11 0257) BP: 129/70 (10/11 0257) Pulse Rate: 82 (10/11 0257)  Labs: Recent Labs    10/15/20 0607 10/15/20 1003 10/15/20 1813 10/16/20 0401 10/16/20 1504 10/16/20 2330 10/17/20 0521  HGB 10.3*  --   --  9.8*  --   --  9.1*  HCT 32.9*  --   --  32.2*  --   --  29.3*  PLT 166  --   --  141*  --   --  147*  HEPARINUNFRC  --   --    < >  --  0.17* 0.37 0.37  CREATININE 1.12  --   --  1.07  --   --  1.17  TROPONINIHS 915* 651*  --  863*  --   --   --    < > = values in this interval not displayed.     Estimated Creatinine Clearance: 56.8 mL/min (by C-G formula based on SCr of 1.17 mg/dL).   Medical History: Past Medical History:  Diagnosis Date   Anemia    Aspiration pneumonia (HCC)    CAD (coronary artery disease)    a. s/p CABG 05/2020.   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   Past Month   aspirin EC 81 MG EC  tablet Take 1 tablet (81 mg total) by mouth daily. Swallow whole. 30 tablet 11 10/13/2020   B Complex Vitamins (VITAMIN B COMPLEX PO) Take 1 tablet by mouth daily.    10/13/2020   cholecalciferol (VITAMIN D3) 25 MCG (1000 UNIT) tablet Take 1,000 Units by mouth daily.   10/13/2020   clopidogrel (PLAVIX) 75 MG tablet Take 1 tablet (75 mg total) by mouth daily. 30 tablet 11 10/13/2020 at 0900   diclofenac sodium (VOLTAREN) 1 % GEL Apply 2 g topically 2 (two) times daily as needed (pain).    10/13/2020   esomeprazole (NEXIUM) 40 MG capsule Take 40 mg by mouth 2 (two) times daily.    10/13/2020   ezetimibe (ZETIA) 10 MG tablet Take 1 tablet (10 mg total) by mouth daily. 90 tablet 1 10/13/2020   metoprolol tartrate (LOPRESSOR) 100 MG tablet Take 1 tablet (100 mg total) by mouth 2 (two) times daily. (Patient taking differently: Take 50 mg by mouth 2 (two) times daily.) 60 tablet 3  10/13/2020 at 1830   pentoxifylline (TRENTAL) 400 MG CR tablet Take 400 mg by mouth 2 (two) times daily.   10/13/2020   rosuvastatin (CRESTOR) 40 MG tablet Take 1 tablet (40 mg total) by mouth daily at 6 PM. 30 tablet 3 10/13/2020   tamsulosin (FLOMAX) 0.4 MG CAPS capsule Take 0.4 mg by mouth daily.   10/13/2020   chlorhexidine (PERIDEX) 0.12 % solution SMARTSIG:By Mouth (Patient not taking: No sig reported)   Not Taking    Assessment: No anticoagulants PTA.  Asked to initiate Heparin for ACS.   Heparin level 0.37, remains therapeutic   Goal of Therapy:  Heparin level 0.3-0.7 units/ml Monitor platelets by anticoagulation protocol: Yes   Plan:  Continue heparin at 1350 units/hr Confirmatory heparin level  daily with AM labs Monitor H/H, Plts F/U plan  Isac Sarna, BS Vena Austria, BCPS Clinical Pharmacist Pager [phone]

## 2020-10-17 NOTE — Progress Notes (Signed)
Progress Note  Patient Name: James Wolf Date of Encounter: 10/17/2020  [facility] Cardiologist: Carlyle Dolly, MD   Subjective   Feeling better. No SOB.   Inpatient Medications    Scheduled Meds:  aspirin EC  81 mg Oral Daily   B-complex with vitamin C  1 tablet Oral Daily   cholecalciferol  1,000 Units Oral Daily   clopidogrel  75 mg Oral Daily   diclofenac Sodium  2 g Topical QID   ezetimibe  10 mg Oral Daily   guaiFENesin  1,200 mg Oral BID   ipratropium-albuterol  3 mL Nebulization TID   metoprolol tartrate  25 mg Oral BID   pantoprazole  40 mg Oral BID   rosuvastatin  40 mg Oral q1800   Continuous Infusions:  sodium chloride 10 mL/hr at 10/14/20 1757   0.9 % NaCl with KCl 20 mEq / L 75 mL/hr at 10/17/20 0040   doxycycline (VIBRAMYCIN) IV 100 mg (10/17/20 1113)   heparin 1,350 Units/hr (10/17/20 0254)   piperacillin-tazobactam (ZOSYN)  IV 3.375 g (10/17/20 0519)   PRN Meds: sodium chloride, acetaminophen **OR** acetaminophen, prochlorperazine   Vital Signs    Vitals:   10/16/20 1502 10/16/20 2007 10/17/20 0257 10/17/20 0808  BP:  (!) 148/81 129/70   Pulse:  100 82   Resp:  20 19   Temp:  98.7 F (37.1 C) 98.4 F (36.9 C)   TempSrc:  Oral Oral   SpO2: 95% 98% 97% 96%  Weight:      Height:        Intake/Output Summary (Last 24 hours) at 10/17/2020 1152 Last data filed at 10/17/2020 0800 Gross per 24 hour  Intake 2062.96 ml  Output 1000 ml  Net 1062.96 ml   Last 3 Weights 10/14/2020 10/03/2020 09/20/2020  Weight (lbs) 168 lb 167 lb 12.8 oz 165 lb  Weight (kg) 76.204 kg 76.114 kg 74.844 kg      Telemetry    SR - Personally Reviewed  ECG    No new - Personally Reviewed  Physical Exam   GEN: No acute distress.   Neck: No JVD Cardiac: RRR, no murmurs, rubs, or gallops. CABG Respiratory: Mild right sided noise. GI: Soft, nontender, non-distended  MS: No edema; No deformity. Neuro:  Nonfocal  Psych: Normal affect   Labs     High Sensitivity Troponin:   Recent Labs  Lab 10/14/20 1037 10/14/20 1250 10/15/20 0607 10/15/20 1003 10/16/20 0401  TROPONINIHS 5 6 915* 651* 863*     Chemistry Recent Labs  Lab 10/14/20 1037 10/15/20 0607 10/16/20 0401 10/17/20 0521  NA 138 140 139 136  K 3.8 4.0 4.2 3.9  CL 106 112* 111 109  CO2 25 25 22  21*  GLUCOSE 174* 119* 116* 110*  BUN 14 18 13 11   CREATININE 1.27* 1.12 1.07 1.17  CALCIUM 8.6* 7.7* 8.1* 8.3*  MG  --  1.6*  --   --   PROT 7.3  --   --   --   ALBUMIN 3.7  --   --   --   AST 53*  --   --   --   ALT 44  --   --   --   ALKPHOS 47  --   --   --   BILITOT 0.5  --   --   --   GFRNONAA >60 >60 >60 >60  ANIONGAP 7 3* 6 6    Lipids  Recent Labs  Lab 10/12/20  0936  CHOL 78  TRIG 55  HDL 46  LDLCALC 21  CHOLHDL 1.7    Hematology Recent Labs  Lab 10/15/20 0607 10/16/20 0401 10/17/20 0521  WBC 15.2* 13.6* 10.2  RBC 3.75* 3.66* 3.40*  HGB 10.3* 9.8* 9.1*  HCT 32.9* 32.2* 29.3*  MCV 87.7 88.0 86.2  MCH 27.5 26.8 26.8  MCHC 31.3 30.4 31.1  RDW 17.6* 17.4* 17.3*  PLT 166 141* 147*   Thyroid  Recent Labs  Lab 10/17/20 0521  TSH 3.663    BNP Recent Labs  Lab 10/14/20 1250  BNP 166.0*    DDimer  Recent Labs  Lab 10/14/20 1250  DDIMER 2.07*     Radiology    DG CHEST PORT 1 VIEW  Result Date: 10/15/2020 CLINICAL DATA:  Dyspnea, aspiration pneumonia, esophageal cancer EXAM: PORTABLE CHEST 1 VIEW COMPARISON:  10/14/2020 FINDINGS: Pulmonary insufflation is stable. There is progressive right basilar consolidation and increasing, small right pleural effusion noted. Surgical changes of gastric pull-up are identified. Left lung is clear. No pneumothorax. No pleural effusion on the left. Coronary artery bypass grafting has been performed. Pulmonary vascularity is normal. IMPRESSION: Progressive right basilar consolidation and enlarging, small right pleural effusion in keeping with a parapneumonic effusion. Electronically Signed   By:  Fidela Salisbury M.D.   On: 10/15/2020 19:27   ECHOCARDIOGRAM LIMITED  Result Date: 10/16/2020    ECHOCARDIOGRAM LIMITED REPORT   Patient Name:   James Wolf Date of Exam: 10/16/2020 Medical Rec #:  [id number]        Height:       65.0 in Accession #:    [id number]       Weight:       168.0 lb Date of Birth:  August 16, 1950        BSA:          1.837 m Patient Age:    70 years         BP:           142/81 mmHg Patient Gender: M                HR:           82 bpm. Exam Location:  [facility] Procedure: Limited Echo Indications:    NSTEMI I21.4  History:        Patient has prior history of Echocardiogram examinations, most                 recent 06/01/2019. CAD and Previous Myocardial Infarction, Prior                 CABG, COPD; Risk Factors:Hypertension, Dyslipidemia and Former                 Smoker.  Sonographer:    Alvino Chapel RCS Referring Phys: [phone] DAVID TAT IMPRESSIONS  1. Left ventricular ejection fraction, by estimation, is 55 to 60%. The left ventricle has normal function. The left ventricle has no regional wall motion abnormalities. There is moderate asymmetric left ventricular hypertrophy of the basal-septal segment.  2. Right ventricular systolic function is normal. The right ventricular size is normal.  3. The inferior vena cava is dilated in size with >50% respiratory variability, suggesting right atrial pressure of 8 mmHg. FINDINGS  Left Ventricle: Left ventricular ejection fraction, by estimation, is 55 to 60%. The left ventricle has normal function. The left ventricle has no regional wall motion abnormalities. The left ventricular internal cavity size was normal in  size. There is  moderate asymmetric left ventricular hypertrophy of the basal-septal segment. Right Ventricle: The right ventricular size is normal. No increase in right ventricular wall thickness. Right ventricular systolic function is normal. Pericardium: There is no evidence of pericardial effusion. Aorta: The aortic root is  normal in size and structure. Venous: The inferior vena cava is dilated in size with greater than 50% respiratory variability, suggesting right atrial pressure of 8 mmHg. LEFT VENTRICLE PLAX 2D LVIDd:         4.50 cm LVIDs:         3.30 cm LV PW:         0.80 cm LV IVS:        0.90 cm  LV Volumes (MOD) LV vol d, MOD A2C: 93.6 ml LV vol d, MOD A4C: 116.0 ml LV vol s, MOD A2C: 46.1 ml LV vol s, MOD A4C: 56.9 ml LV SV MOD A2C:     47.5 ml LV SV MOD A4C:     116.0 ml LV SV MOD BP:      53.3 ml IVC IVC diam: 2.10 cm LEFT ATRIUM         Index LA diam:    3.80 cm 2.07 cm/m   AORTA Ao Root diam: 3.20 cm Oswaldo Milian MD Electronically signed by Oswaldo Milian MD Signature Date/Time: 10/16/2020/12:13:41 PM    Final     Cardiac Studies   Echocardiogram 05/31/2020 - EF 65% no MR, no AAS.  Echocardiogram here during this admission 10/16/2020 - EF60% reassuring.  Cardiac catheterization 05/2020 - Severe triple-vessel CAD left main disease.  Patient Profile     70 y.o. male with shoulder pain elevated troponin, prior non-STEMI with CABG in May 2022, LIMA to LAD SVG to OM SVG to distal RCA.  Carotid artery stenosis as well right greater than left.  Has had esophageal cancer status post esophagectomy with gastric pull-through in 1995.  Chronic kidney disease stage IIIa chronic anemia with iron infusions at [facility], prior rheumatic fever.  Assessment & Plan    Elevated troponin, shoulder pain, likely demand ischemia, type II myocardial infarction, supply demand mismatch in the setting of pneumonia - CT angiogram ruled out pulmonary embolism.  There was concern about right middle lobe pneumonia with large amount of food as well in the neoesophagus. -Troponin peaked at 915. -Given his evaluation with echocardiogram which shows normal ejection fraction once again of 60% with no new wall motion abnormalities, we will continue with medical management.  I do not believe that he needs a cardiac  catheterization at this time. -Continue with IV heparin for a total of 48 hours.  Pharmacy team to assist with timing. -Continue with aspirin 81 mg, Plavix 75 mg, Crestor 40 mg, Zetia 10 mg for high intensity statin dose with goal LDL of less than 55.  Right middle lobe pneumonia - Being treated with Zosyn IV.  Note food in the esophagus.  Prior esophagectomy.  No further recommendations from cardiology at this time.  We will go ahead and sign off.  We will have clinic follow-up as previously scheduled.  In 1 week, he may once again participate in cardiac rehab.  For questions or updates, please contact [place] Please consult [email or web address] for contact info under        Signed, Candee Furbish, MD  10/17/2020, 11:52 AM

## 2020-10-17 NOTE — Discharge Summary (Addendum)
Physician Discharge Summary  James Wolf YTK:[id number] DOB: 01-05-1951 DOA: 10/14/2020  PCP: Sherrilee Gilles, DO  Admit date: 10/14/2020 Discharge date: 10/18/2020  Admitted From: Home Disposition:  Home   Recommendations for Outpatient Follow-up:  Follow up with PCP in 1-2 weeks Please obtain BMP/CBC in one week    Discharge Condition: Stable CODE STATUS: FULL Diet recommendation: Heart Healthy    Brief/Interim Summary:  70 y.o. 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  About one hour later, he had one episde of n/v.  Patient denies fevers, chills, headache, chest pain, nausea, vomiting, diarrhea, abdominal pain, dysuria, hematuria, hematochezia, and melena.  He has a nonproductive cough. Patient states that he had been in his usual state of health this past week.  He denies any worsening reflux symptoms or any aspiration events in the past week.  He states that he normally sleeps in almost a seated position because of his recurrent issues with his esophagectomy and aspiration.  He denies any new medications.  The patient was recently admitted to the hospital from 06/23/2020 to 06/25/2020 for aspiration pneumonia.  He was discharged home with Augmentin after being treated with Unasyn. From EMS was activated, they noted the patient have oxygen saturation of 82% on room air.  The patient was placed on 2 L with oxygen saturation into the mid 90s.   ED -Patient was afebrile and hemodynamically stable he was tachypneic with oxygen saturation 95% on 2 L.  BMP showed sodium 138, potassium 3.8, serum creatinine 1.27 from, AST 53, ALT 44, alk phosphatase 47, total bilirubin 0.5.  Chest  x-ray showed chronic interstitial markings with increasing right lower lobe opacity. WBC 9.4, hemoglobin 12.2, platelets 182,000.  The patient was given a dose of Unasyn and admission was requested  Discharge Diagnoses:   Acute respiratory failure with hypoxia -Secondary to aspiration pneumonia -Presently stable on 2 L nasal cannula>>weaned to RA -Wean oxygen back to room air -Check D-dimer-2.07 -PCT 5.24 -Continue duo nebs -still sob with minimal exertion -CTA chest--NEG PE; RML and RLL air space opacities   Aspiration pneumonia -Continue Unasyn>>zosyn -continue doxycycline -d/c home with amox/clav and doxy x 4 more days   Elevated Troponin -demand ischemia, type II myocardial infarction, supply demand mismatch in the setting of pneumonia -troponin 915>>651>>863 -10/10 Echo EF 55-60%, no WMA -continue IV heparin x 48 hour -personally reviewed EKG-sinus, TWI V1, V2 -continue ASA, plavix, metoprolol, statin   GERD -Continue PPI   Coronary artery disease -S/p CABG x3 06/02/2020 -Continue aspirin Plavix -No chest pain but complains of left shoulder burning -Continue statin -Continue metoprolol tartrate -troponin [id number]>>863   Essential hypertension -restart metoprolol  -previously on telmisartan which has been discontinued   Impaired glucose tolerance -06/01/2020 hemoglobin A1c 6.2   Dyslipidemia -Continue statin -LDL 99>>21   LUL lung nodule -Continue outpatient surveillance   CKD stage IIIa -Baseline creatinine 1.0-1.3   Thrombocytopenia -due to acute medical illness    Discharge Instructions   Allergies as of 10/17/2020       Reactions   Zithromax [azithromycin] Diarrhea   Causes pt's stomach to "tear up" does not want to take medication again.         Medication List     STOP taking these  medications    chlorhexidine 0.12 % solution Commonly known as: PERIDEX   pentoxifylline 400 MG CR tablet Commonly known as: TRENTAL       TAKE  these medications    albuterol 108 (90 Base) MCG/ACT inhaler Commonly known as: VENTOLIN HFA Inhale 1-2 puffs into the lungs every 6 (six) hours as needed for wheezing or shortness of breath.   amoxicillin-clavulanate 875-125 MG tablet Commonly known as: Augmentin Take 1 tablet by mouth 2 (two) times daily.   aspirin 81 MG EC tablet Take 1 tablet (81 mg total) by mouth daily. Swallow whole.   cholecalciferol 25 MCG (1000 UNIT) tablet Commonly known as: VITAMIN D3 Take 1,000 Units by mouth daily.   clopidogrel 75 MG tablet Commonly known as: PLAVIX Take 1 tablet (75 mg total) by mouth daily.   diclofenac sodium 1 % Gel Commonly known as: VOLTAREN Apply 2 g topically 2 (two) times daily as needed (pain).   doxycycline 100 MG tablet Commonly known as: VIBRA-TABS Take 1 tablet (100 mg total) by mouth 2 (two) times daily.   esomeprazole 40 MG capsule Commonly known as: NEXIUM Take 40 mg by mouth 2 (two) times daily.   ezetimibe 10 MG tablet Commonly known as: ZETIA Take 1 tablet (10 mg total) by mouth daily.   metoprolol tartrate 100 MG tablet Commonly known as: LOPRESSOR Take 1 tablet (100 mg total) by mouth 2 (two) times daily. What changed: how much to take   rosuvastatin 40 MG tablet Commonly known as: CRESTOR Take 1 tablet (40 mg total) by mouth daily at 6 PM.   tamsulosin 0.4 MG Caps capsule Commonly known as: FLOMAX Take 0.4 mg by mouth daily.   VITAMIN B COMPLEX PO Take 1 tablet by mouth daily.        Allergies  Allergen Reactions   Zithromax [Azithromycin] Diarrhea    Causes pt's stomach to "tear up" does not want to take medication again.     Consultations: cardiology   Procedures/Studies: DG Shoulder Right  Result Date: 09/20/2020 Right shoulder x-ray pain for 32 years Mild narrowing of the right shoulder glenohumeral joint sclerosis of the rotator cuff insertion for mild AC joint narrowing Impression mild arthritis right shoulder mild AC  joint arthritis  CT Angio Chest Pulmonary Embolism (PE) W or WO Contrast  Result Date: 10/15/2020 CLINICAL DATA:  Shortness of breath, elevated D-dimer EXAM: CT ANGIOGRAPHY CHEST WITH CONTRAST TECHNIQUE: Multidetector CT imaging of the chest was performed using the standard protocol during bolus administration of intravenous contrast. Multiplanar CT image reconstructions and MIPs were obtained to evaluate the vascular anatomy. CONTRAST:  88m OMNIPAQUE IOHEXOL 350 MG/ML SOLN COMPARISON:  06/23/2020 FINDINGS: Cardiovascular: Satisfactory opacification of the pulmonary arteries to the segmental level. No evidence of pulmonary embolism. Normal heart size. No pericardial effusion. Prior CABG. Thoracic aortic atherosclerosis. Mediastinum/Nodes: No enlarged mediastinal, hilar, or axillary lymph nodes. Thyroid gland and trachea demonstrate no significant findings. Esophageal resection with gastric pull-through with a large amount of food material within the neo-esophagus. Lungs/Pleura: Trace bilateral pleural effusion. Right middle lobe and right lower lobe airspace disease concerning for pneumonia. No pneumothorax. Upper Abdomen: Nonobstructing right renal calculus. Musculoskeletal: No chest wall abnormality. No acute or significant osseous findings. Review of the MIP images confirms the above findings. IMPRESSION: 1. No evidence of pulmonary embolus. 2. Esophageal resection with gastric pull-through with a large amount of food material within the neo-esophagus. 3. Right middle lobe and right lower lobe airspace disease concerning for pneumonia. 4.  Aortic Atherosclerosis (ICD10-I70.0). Electronically Signed   By: Kathreen Devoid M.D.   On: 10/15/2020 12:20   DG CHEST PORT 1 VIEW  Result Date: 10/15/2020 CLINICAL DATA:  Dyspnea, aspiration pneumonia, esophageal cancer EXAM: PORTABLE CHEST 1 VIEW COMPARISON:  10/14/2020 FINDINGS: Pulmonary insufflation is stable. There is progressive right basilar consolidation and  increasing, small right pleural effusion noted. Surgical changes of gastric pull-up are identified. Left lung is clear. No pneumothorax. No pleural effusion on the left. Coronary artery bypass grafting has been performed. Pulmonary vascularity is normal. IMPRESSION: Progressive right basilar consolidation and enlarging, small right pleural effusion in keeping with a parapneumonic effusion. Electronically Signed   By: Fidela Salisbury M.D.   On: 10/15/2020 19:27   DG Chest Port 1 View  Result Date: 10/14/2020 CLINICAL DATA:  70 year old male with weakness, shortness of breath and nausea vomiting since 0500 hours. Query aspiration. EXAM: PORTABLE CHEST 1 VIEW COMPARISON:  Chest radiograph 07/11/2020 and earlier. CT 06/23/2020. FINDINGS: Portable AP upright view at 1050 hours. Prior CT demonstrates sequelae of esophagectomy and gastric pull-through. Superimposed CABG changes also noted. Chronic blunting of the right costophrenic angle, but increased patchy opacity there compared to July radiographs. No superimposed pneumothorax or pulmonary edema. Left lung appears stable and relatively clear. Visualized tracheal air column is within normal limits. No acute osseous abnormality identified. Paucity of bowel gas in the upper abdomen. IMPRESSION: Chronic blunting of the right lung base status post prior esophagectomy and gastric pull-through, but there is increased patchy opacity there since July suspicious for aspiration in this setting. Electronically Signed   By: Genevie Ann M.D.   On: 10/14/2020 11:04   ECHOCARDIOGRAM LIMITED  Result Date: 10/16/2020    ECHOCARDIOGRAM LIMITED REPORT   Patient Name:   James Wolf Date of Exam: 10/16/2020 Medical Rec #:  [id number]        Height:       65.0 in Accession #:    [id number]       Weight:       168.0 lb Date of Birth:  [birth year]        BSA:          1.837 m Patient Age:    77 years         BP:           142/81 mmHg Patient Gender: M                HR:           82  bpm. Exam Location:  [facility] Procedure: Limited Echo Indications:    NSTEMI I21.4  History:        Patient has prior history of Echocardiogram examinations, most                 recent 06/01/2019. CAD and Previous Myocardial Infarction, Prior                 CABG, COPD; Risk Factors:Hypertension, Dyslipidemia and Former                 Smoker.  Sonographer:    Alvino Chapel RCS Referring Phys: [phone] Alysa Duca IMPRESSIONS  1. Left ventricular ejection fraction, by estimation, is 55 to 60%. The left ventricle has normal function. The left ventricle has no regional wall motion abnormalities. There is moderate asymmetric left ventricular hypertrophy of the basal-septal segment.  2. Right ventricular systolic function is normal. The right ventricular size is normal.  3. The  inferior vena cava is dilated in size with >50% respiratory variability, suggesting right atrial pressure of 8 mmHg. FINDINGS  Left Ventricle: Left ventricular ejection fraction, by estimation, is 55 to 60%. The left ventricle has normal function. The left ventricle has no regional wall motion abnormalities. The left ventricular internal cavity size was normal in size. There is  moderate asymmetric left ventricular hypertrophy of the basal-septal segment. Right Ventricle: The right ventricular size is normal. No increase in right ventricular wall thickness. Right ventricular systolic function is normal. Pericardium: There is no evidence of pericardial effusion. Aorta: The aortic root is normal in size and structure. Venous: The inferior vena cava is dilated in size with greater than 50% respiratory variability, suggesting right atrial pressure of 8 mmHg. LEFT VENTRICLE PLAX 2D LVIDd:         4.50 cm LVIDs:         3.30 cm LV PW:         0.80 cm LV IVS:        0.90 cm  LV Volumes (MOD) LV vol d, MOD A2C: 93.6 ml LV vol d, MOD A4C: 116.0 ml LV vol s, MOD A2C: 46.1 ml LV vol s, MOD A4C: 56.9 ml LV SV MOD A2C:     47.5 ml LV SV MOD A4C:     116.0 ml LV  SV MOD BP:      53.3 ml IVC IVC diam: 2.10 cm LEFT ATRIUM         Index LA diam:    3.80 cm 2.07 cm/m   AORTA Ao Root diam: 3.20 cm Oswaldo Milian MD Electronically signed by Oswaldo Milian MD Signature Date/Time: 10/16/2020/12:13:41 PM    Final         Discharge Exam: Vitals:   10/17/20 1313 10/17/20 1410  BP: 136/86   Pulse: 81   Resp: 20   Temp: 98.6 F (37 C)   SpO2: 97% 97%   Vitals:   10/17/20 0257 10/17/20 0808 10/17/20 1313 10/17/20 1410  BP: 129/70  136/86   Pulse: 82  81   Resp: 19  20   Temp: 98.4 F (36.9 C)  98.6 F (37 C)   TempSrc: Oral  Oral   SpO2: 97% 96% 97% 97%  Weight:      Height:        General: Pt is alert, awake, not in acute distress Cardiovascular: RRR, S1/S2 +, no rubs, no gallops Respiratory: bibasilar rales, R>L.  No wheeze Abdominal: Soft, NT, ND, bowel sounds + Extremities: no edema, no cyanosis   The results of significant diagnostics from this hospitalization (including imaging, microbiology, ancillary and laboratory) are listed below for reference.    Significant Diagnostic Studies: DG Shoulder Right  Result Date: 09/20/2020 Right shoulder x-ray pain for 32 years Mild narrowing of the right shoulder glenohumeral joint sclerosis of the rotator cuff insertion for mild AC joint narrowing Impression mild arthritis right shoulder mild AC joint arthritis  CT Angio Chest Pulmonary Embolism (PE) W or WO Contrast  Result Date: 10/15/2020 CLINICAL DATA:  Shortness of breath, elevated D-dimer EXAM: CT ANGIOGRAPHY CHEST WITH CONTRAST TECHNIQUE: Multidetector CT imaging of the chest was performed using the standard protocol during bolus administration of intravenous contrast. Multiplanar CT image reconstructions and MIPs were obtained to evaluate the vascular anatomy. CONTRAST:  46m OMNIPAQUE IOHEXOL 350 MG/ML SOLN COMPARISON:  06/23/2020 FINDINGS: Cardiovascular: Satisfactory opacification of the pulmonary arteries to the segmental  level. No evidence of pulmonary embolism. Normal heart  size. No pericardial effusion. Prior CABG. Thoracic aortic atherosclerosis. Mediastinum/Nodes: No enlarged mediastinal, hilar, or axillary lymph nodes. Thyroid gland and trachea demonstrate no significant findings. Esophageal resection with gastric pull-through with a large amount of food material within the neo-esophagus. Lungs/Pleura: Trace bilateral pleural effusion. Right middle lobe and right lower lobe airspace disease concerning for pneumonia. No pneumothorax. Upper Abdomen: Nonobstructing right renal calculus. Musculoskeletal: No chest wall abnormality. No acute or significant osseous findings. Review of the MIP images confirms the above findings. IMPRESSION: 1. No evidence of pulmonary embolus. 2. Esophageal resection with gastric pull-through with a large amount of food material within the neo-esophagus. 3. Right middle lobe and right lower lobe airspace disease concerning for pneumonia. 4.  Aortic Atherosclerosis (ICD10-I70.0). Electronically Signed   By: Kathreen Devoid M.D.   On: 10/15/2020 12:20   DG CHEST PORT 1 VIEW  Result Date: 10/15/2020 CLINICAL DATA:  Dyspnea, aspiration pneumonia, esophageal cancer EXAM: PORTABLE CHEST 1 VIEW COMPARISON:  10/14/2020 FINDINGS: Pulmonary insufflation is stable. There is progressive right basilar consolidation and increasing, small right pleural effusion noted. Surgical changes of gastric pull-up are identified. Left lung is clear. No pneumothorax. No pleural effusion on the left. Coronary artery bypass grafting has been performed. Pulmonary vascularity is normal. IMPRESSION: Progressive right basilar consolidation and enlarging, small right pleural effusion in keeping with a parapneumonic effusion. Electronically Signed   By: Fidela Salisbury M.D.   On: 10/15/2020 19:27   DG Chest Port 1 View  Result Date: 10/14/2020 CLINICAL DATA:  70 year old male with weakness, shortness of breath and nausea vomiting  since 0500 hours. Query aspiration. EXAM: PORTABLE CHEST 1 VIEW COMPARISON:  Chest radiograph 07/11/2020 and earlier. CT 06/23/2020. FINDINGS: Portable AP upright view at 1050 hours. Prior CT demonstrates sequelae of esophagectomy and gastric pull-through. Superimposed CABG changes also noted. Chronic blunting of the right costophrenic angle, but increased patchy opacity there compared to July radiographs. No superimposed pneumothorax or pulmonary edema. Left lung appears stable and relatively clear. Visualized tracheal air column is within normal limits. No acute osseous abnormality identified. Paucity of bowel gas in the upper abdomen. IMPRESSION: Chronic blunting of the right lung base status post prior esophagectomy and gastric pull-through, but there is increased patchy opacity there since July suspicious for aspiration in this setting. Electronically Signed   By: Genevie Ann M.D.   On: 10/14/2020 11:04   ECHOCARDIOGRAM LIMITED  Result Date: 10/16/2020    ECHOCARDIOGRAM LIMITED REPORT   Patient Name:   James Wolf Date of Exam: 10/16/2020 Medical Rec #:  [id number]        Height:       65.0 in Accession #:    [id number]       Weight:       168.0 lb Date of Birth:  1950/11/12        BSA:          1.837 m Patient Age:    35 years         BP:           142/81 mmHg Patient Gender: M                HR:           82 bpm. Exam Location:  [facility] Procedure: Limited Echo Indications:    NSTEMI I21.4  History:        Patient has prior history of Echocardiogram examinations, most  recent 06/01/2019. CAD and Previous Myocardial Infarction, Prior                 CABG, COPD; Risk Factors:Hypertension, Dyslipidemia and Former                 Smoker.  Sonographer:    Alvino Chapel RCS Referring Phys: [phone] Mahogani Holohan IMPRESSIONS  1. Left ventricular ejection fraction, by estimation, is 55 to 60%. The left ventricle has normal function. The left ventricle has no regional wall motion abnormalities. There is  moderate asymmetric left ventricular hypertrophy of the basal-septal segment.  2. Right ventricular systolic function is normal. The right ventricular size is normal.  3. The inferior vena cava is dilated in size with >50% respiratory variability, suggesting right atrial pressure of 8 mmHg. FINDINGS  Left Ventricle: Left ventricular ejection fraction, by estimation, is 55 to 60%. The left ventricle has normal function. The left ventricle has no regional wall motion abnormalities. The left ventricular internal cavity size was normal in size. There is  moderate asymmetric left ventricular hypertrophy of the basal-septal segment. Right Ventricle: The right ventricular size is normal. No increase in right ventricular wall thickness. Right ventricular systolic function is normal. Pericardium: There is no evidence of pericardial effusion. Aorta: The aortic root is normal in size and structure. Venous: The inferior vena cava is dilated in size with greater than 50% respiratory variability, suggesting right atrial pressure of 8 mmHg. LEFT VENTRICLE PLAX 2D LVIDd:         4.50 cm LVIDs:         3.30 cm LV PW:         0.80 cm LV IVS:        0.90 cm  LV Volumes (MOD) LV vol d, MOD A2C: 93.6 ml LV vol d, MOD A4C: 116.0 ml LV vol s, MOD A2C: 46.1 ml LV vol s, MOD A4C: 56.9 ml LV SV MOD A2C:     47.5 ml LV SV MOD A4C:     116.0 ml LV SV MOD BP:      53.3 ml IVC IVC diam: 2.10 cm LEFT ATRIUM         Index LA diam:    3.80 cm 2.07 cm/m   AORTA Ao Root diam: 3.20 cm Oswaldo Milian MD Electronically signed by Oswaldo Milian MD Signature Date/Time: 10/16/2020/12:13:41 PM    Final     Microbiology: Recent Results (from the past 240 hour(s))  Resp Panel by RT-PCR (Flu A&B, Covid) Nasopharyngeal Swab     Status: None   Collection Time: 10/14/20 11:40 AM   Specimen: Nasopharyngeal Swab; Nasopharyngeal(NP) swabs in vial transport medium  Result Value Ref Range Status   SARS Coronavirus 2 by RT PCR NEGATIVE NEGATIVE  Final    Comment: (NOTE) SARS-CoV-2 target nucleic acids are NOT DETECTED.  The SARS-CoV-2 RNA is generally detectable in upper respiratory specimens during the acute phase of infection. The lowest concentration of SARS-CoV-2 viral copies this assay can detect is 138 copies/mL. A negative result does not preclude SARS-Cov-2 infection and should not be used as the sole basis for treatment or other patient management decisions. A negative result may occur with  improper specimen collection/handling, submission of specimen other than nasopharyngeal swab, presence of viral mutation(s) within the areas targeted by this assay, and inadequate number of viral copies(<138 copies/mL). A negative result must be combined with clinical observations, patient history, and epidemiological information. The expected result is Negative.  Fact Sheet for Patients:  EntrepreneurPulse.com.au  Fact Sheet for Healthcare Providers:  IncredibleEmployment.be  This test is no t yet approved or cleared by the [place] FDA and  has been authorized for detection and/or diagnosis of SARS-CoV-2 by FDA under an Emergency Use Authorization (EUA). This EUA will remain  in effect (meaning this test can be used) for the duration of the COVID-19 declaration under Section 564(b)(1) of the Act, 21 U.S.C.section 360bbb-3(b)(1), unless the authorization is terminated  or revoked sooner.       Influenza A by PCR NEGATIVE NEGATIVE Final   Influenza B by PCR NEGATIVE NEGATIVE Final    Comment: (NOTE) The Xpert Xpress SARS-CoV-2/FLU/RSV plus assay is intended as an aid in the diagnosis of influenza from Nasopharyngeal swab specimens and should not be used as a sole basis for treatment. Nasal washings and aspirates are unacceptable for Xpert Xpress SARS-CoV-2/FLU/RSV testing.  Fact Sheet for Patients: EntrepreneurPulse.com.au  Fact Sheet for Healthcare  Providers: IncredibleEmployment.be  This test is not yet approved or cleared by the [place] FDA and has been authorized for detection and/or diagnosis of SARS-CoV-2 by FDA under an Emergency Use Authorization (EUA). This EUA will remain in effect (meaning this test can be used) for the duration of the COVID-19 declaration under Section 564(b)(1) of the Act, 21 U.S.C. section 360bbb-3(b)(1), unless the authorization is terminated or revoked.  Performed at [facility], [address], [place], [place] [address]      Labs: Basic Metabolic Panel: Recent Labs  Lab 10/14/20 1037 10/15/20 0607 10/16/20 0401 10/17/20 0521  NA 138 140 139 136  K 3.8 4.0 4.2 3.9  CL 106 112* 111 109  CO2 _0 21*  GLUCOSE 174* 119* 116* 110*  BUN _1 CREATININE 1.27* 1.12 1.07 1.17  CALCIUM 8.6* 7.7* 8.1* 8.3*  MG  --  1.6*  --   --    Liver Function Tests: Recent Labs  Lab 10/14/20 1037  AST 53*  ALT 44  ALKPHOS 47  BILITOT 0.5  PROT 7.3  ALBUMIN 3.7   No results for input(s): LIPASE, AMYLASE in the last 168 hours. No results for input(s): AMMONIA in the last 168 hours. CBC: Recent Labs  Lab 10/14/20 1037 10/15/20 0607 10/16/20 0401 10/17/20 0521  WBC 9.4 15.2* 13.6* 10.2  NEUTROABS 8.4*  --   --   --   HGB 12.2* 10.3* 9.8* 9.1*  HCT 38.4* 32.9* 32.2* 29.3*  MCV 87.5 87.7 88.0 86.2  PLT 182 166 141* 147*   Cardiac Enzymes: No results for input(s): CKTOTAL, CKMB, CKMBINDEX, TROPONINI in the last 168 hours. BNP: Invalid input(s): POCBNP CBG: No results for input(s): GLUCAP in the last 168 hours.  Time coordinating discharge:  36 minutes  Signed:  Orson Eva, DO [facility] Pager: [phone] 10/17/2020, 5:23 PM

## 2020-10-17 NOTE — Plan of Care (Signed)

## 2020-10-18 ENCOUNTER — Encounter (HOSPITAL_COMMUNITY): Payer: Medicare Other

## 2020-10-18 LAB — CBC
HCT: 33.2 % — ABNORMAL LOW (ref 39.0–52.0)
Hemoglobin: 10.6 g/dL — ABNORMAL LOW (ref 13.0–17.0)
MCH: 27.2 pg (ref 26.0–34.0)
MCHC: 31.9 g/dL (ref 30.0–36.0)
MCV: 85.3 fL (ref 80.0–100.0)
Platelets: 163 10*3/uL (ref 150–400)
RBC: 3.89 MIL/uL — ABNORMAL LOW (ref 4.22–5.81)
RDW: 17.2 % — ABNORMAL HIGH (ref 11.5–15.5)
WBC: 8.3 10*3/uL (ref 4.0–10.5)
nRBC: 0 % (ref 0.0–0.2)

## 2020-10-18 MED ORDER — AMOXICILLIN-POT CLAVULANATE 875-125 MG PO TABS
1.0000 | ORAL_TABLET | Freq: Two times a day (BID) | ORAL | Status: DC
  Administered 2020-10-18: 1 via ORAL
  Filled 2020-10-18: qty 1

## 2020-10-18 MED ORDER — DOXYCYCLINE HYCLATE 100 MG PO TABS
100.0000 mg | ORAL_TABLET | Freq: Two times a day (BID) | ORAL | Status: DC
  Administered 2020-10-18: 100 mg via ORAL
  Filled 2020-10-18: qty 1

## 2020-10-18 NOTE — Progress Notes (Signed)
10/18/2020 10:20 AM  Pt was seen and examined.  He is eager to go home today.  His labs are stable.  He feels well.  DC summary dictated by Dr. Carles Collet on 10/11.   He is stable to DC home today.  See DC orders.    Murvin Natal, MD  How to contact the [facility] Attending or Consulting provider [place] or covering provider during after hours [place], for this patient?  Check the care team in [facility] and look for a) attending/consulting [facility] provider listed and b) the [facility] team listed Log into [email or web address] and use [place]'s universal password to access. If you do not have the password, please contact the hospital operator. Locate the [facility] provider you are looking for under [facility] and page to a number that you can be directly reached. If you still have difficulty reaching the provider, please page the [facility] (Director on Call) for the Hospitalists listed on amion for assistance.

## 2020-10-18 NOTE — Progress Notes (Signed)
Nsg Discharge Note  Admit Date:  10/14/2020 Discharge date: 10/18/2020   Lavona Mound to be D/C'd Home per MD order.  AVS completed.  Copy for chart, and copy for patient signed, and dated. Patient/caregiver able to verbalize understanding.  Discharge Medication: Allergies as of 10/18/2020       Reactions   Zithromax [azithromycin] Diarrhea   Causes pt's stomach to "tear up" does not want to take medication again.         Medication List     STOP taking these medications    chlorhexidine 0.12 % solution Commonly known as: PERIDEX   pentoxifylline 400 MG CR tablet Commonly known as: TRENTAL       TAKE these medications    albuterol 108 (90 Base) MCG/ACT inhaler Commonly known as: VENTOLIN HFA Inhale 1-2 puffs into the lungs every 6 (six) hours as needed for wheezing or shortness of breath.   amoxicillin-clavulanate 875-125 MG tablet Commonly known as: Augmentin Take 1 tablet by mouth 2 (two) times daily.   aspirin 81 MG EC tablet Take 1 tablet (81 mg total) by mouth daily. Swallow whole.   cholecalciferol 25 MCG (1000 UNIT) tablet Commonly known as: VITAMIN D3 Take 1,000 Units by mouth daily.   clopidogrel 75 MG tablet Commonly known as: PLAVIX Take 1 tablet (75 mg total) by mouth daily.   diclofenac sodium 1 % Gel Commonly known as: VOLTAREN Apply 2 g topically 2 (two) times daily as needed (pain).   doxycycline 100 MG tablet Commonly known as: VIBRA-TABS Take 1 tablet (100 mg total) by mouth 2 (two) times daily.   esomeprazole 40 MG capsule Commonly known as: NEXIUM Take 40 mg by mouth 2 (two) times daily.   ezetimibe 10 MG tablet Commonly known as: ZETIA Take 1 tablet (10 mg total) by mouth daily.   metoprolol tartrate 100 MG tablet Commonly known as: LOPRESSOR Take 1 tablet (100 mg total) by mouth 2 (two) times daily. What changed: how much to take   rosuvastatin 40 MG tablet Commonly known as: CRESTOR Take 1 tablet (40 mg total) by  mouth daily at 6 PM.   tamsulosin 0.4 MG Caps capsule Commonly known as: FLOMAX Take 0.4 mg by mouth daily.   VITAMIN B COMPLEX PO Take 1 tablet by mouth daily.        Discharge Assessment: Vitals:   10/18/20 1049 10/18/20 1146  BP: 121/75 129/87  Pulse: (!) 103 (!) 102  Resp:  18  Temp:  98.8 F (37.1 C)  SpO2:  93%   Skin clean, dry and intact without evidence of skin break down, no evidence of skin tears noted. IV catheter discontinued intact. Site without signs and symptoms of complications - no redness or edema noted at insertion site, patient denies c/o pain - only slight tenderness at site.  Dressing with slight pressure applied.  D/c Instructions-Education: Discharge instructions given to patient/family with verbalized understanding. D/c education completed with patient/family including follow up instructions, medication list, d/c activities limitations if indicated, with other d/c instructions as indicated by MD - patient able to verbalize understanding, all questions fully answered. Patient instructed to return to ED, call 911, or call MD for any changes in condition.  Patient escorted via [place], and D/C home via private auto.  Dorcas Mcmurray, LPN [id number] 0:27 PM

## 2020-10-18 NOTE — Care Management Important Message (Signed)
Important Message  Patient Details  Name: James Wolf MRN: [id number] Date of Birth: [birth year]   Medicare Important Message Given:  Yes     Tommy Medal 10/18/2020, 1:27 PM

## 2020-10-18 NOTE — Progress Notes (Signed)
Cardiac Individual Treatment Plan  Patient Details  Name: James Wolf MRN: [id number] Date of Birth: [birth year] Referring Provider:   Flowsheet Row CARDIAC REHAB PHASE II ORIENTATION from 08/23/2020 in [place]  Referring Provider Dr. Roxy Manns       Initial Encounter Date:  Flowsheet Row CARDIAC REHAB PHASE II ORIENTATION from 08/23/2020 in [place]  Date 08/23/20       Visit Diagnosis: S/P CABG x 3  NSTEMI (non-ST elevated myocardial infarction) [facility])  Patient's Home Medications on Admission: No current facility-administered medications for this encounter.  Current Outpatient Medications:    amoxicillin-clavulanate (AUGMENTIN) 875-125 MG tablet, Take 1 tablet by mouth 2 (two) times daily., Disp: 8 tablet, Rfl: 0   doxycycline (VIBRA-TABS) 100 MG tablet, Take 1 tablet (100 mg total) by mouth 2 (two) times daily., Disp: 8 tablet, Rfl: 0  Facility-Administered Medications Ordered in Other Encounters:    0.9 %  sodium chloride infusion, , Intravenous, PRN, Tat, David, MD, Last Rate: 10 mL/hr at 10/14/20 1757, New Bag at 10/14/20 1757   0.9 % NaCl with KCl 20 mEq/ L  infusion, , Intravenous, Continuous, Tat, David, MD, Last Rate: 75 mL/hr at 10/17/20 0040, New Bag at 10/17/20 0040   acetaminophen (TYLENOL) tablet 650 mg, 650 mg, Oral, Q6H PRN, 650 mg at 10/15/20 1736 **OR** acetaminophen (TYLENOL) suppository 650 mg, 650 mg, Rectal, Q6H PRN, Tat, David, MD   amoxicillin-clavulanate (AUGMENTIN) 875-125 MG per tablet 1 tablet, 1 tablet, Oral, Q12H, Willodene Stallings, Clanford L, MD   aspirin EC tablet 81 mg, 81 mg, Oral, Daily, Tat, David, MD, 81 mg at 10/17/20 7017   B-complex with vitamin C tablet 1 tablet, 1 tablet, Oral, Daily, Tat, David, MD, 1 tablet at 10/17/20 7939   cholecalciferol (VITAMIN D3) tablet 1,000 Units, 1,000 Units, Oral, Daily, Tat, David, MD, 1,000 Units at 10/17/20 0300   clopidogrel (PLAVIX) tablet 75 mg, 75 mg, Oral,  Daily, Tat, David, MD, 75 mg at 10/17/20 9233   diclofenac Sodium (VOLTAREN) 1 % topical gel 2 g, 2 g, Topical, QID, Tat, David, MD, 2 g at 10/17/20 2016   doxycycline (VIBRA-TABS) tablet 100 mg, 100 mg, Oral, Q12H, Ashwin Tibbs, Clanford L, MD   ezetimibe (ZETIA) tablet 10 mg, 10 mg, Oral, Daily, Tat, David, MD, 10 mg at 10/17/20 0076   guaiFENesin (MUCINEX) 12 hr tablet 1,200 mg, 1,200 mg, Oral, BID, Tat, David, MD, 1,200 mg at 10/17/20 2015   ipratropium-albuterol (DUONEB) 0.5-2.5 (3) MG/3ML nebulizer solution 3 mL, 3 mL, Nebulization, TID, Tat, David, MD, 3 mL at 10/17/20 1932   metoprolol tartrate (LOPRESSOR) tablet 25 mg, 25 mg, Oral, BID, Tat, David, MD, 25 mg at 10/17/20 2015   pantoprazole (PROTONIX) EC tablet 40 mg, 40 mg, Oral, BID, Tat, David, MD, 40 mg at 10/17/20 2015   prochlorperazine (COMPAZINE) injection 10 mg, 10 mg, Intravenous, Q6H PRN, Tat, David, MD   rosuvastatin (CRESTOR) tablet 40 mg, 40 mg, Oral, q1800, Tat, Shanon Brow, MD, 40 mg at 10/17/20 1709  Past Medical History: Past Medical History:  Diagnosis Date   Anemia    Aspiration pneumonia ([place])    CAD (coronary artery disease)    a. s/p CABG 05/2020.   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   LIMA to LAD, SVG to OM, SVG to RCA    Tobacco Use: Social History   Tobacco Use  Smoking Status Former   Packs/day: 1.00   Years: 25.00   Pack years: 25.00   Types: Cigarettes   Quit date: 02/19/1993   Years since quitting: 27.6  Smokeless Tobacco Never    Labs: Recent Review Flowsheet Data     Labs for ITP Cardiac and Pulmonary Rehab Latest Ref Rng & Units 06/02/2020  06/02/2020 06/02/2020 10/12/2020 10/15/2020   Cholestrol 0 - 200 mg/dL - - - 78 -   LDLCALC 0 - 99 mg/dL - - - 21 -   HDL >40 mg/dL - - - 46 -   Trlycerides <150 mg/dL - - - 55 -   Hemoglobin A1c 4.8 - 5.6 % - - - - -   PHART 7.350 - 7.450 7.342(L) 7.358 7.342(L) - 7.460(H)   PCO2ART 32.0 - 48.0 mmHg 42.3 43.1 44.4 - 29.8(L)   HCO3 20.0 - 28.0 mmol/L 23.3 24.3 24.0 - 22.8   TCO2 22 - 32 mmol/L 25 26 25  - -   ACIDBASEDEF 0.0 - 2.0 mmol/L 3.0(H) 1.0 2.0 - 2.4(H)   O2SAT % 96.0 96.0 99.0 - 95.1       Capillary Blood Glucose: Lab Results  Component Value Date   GLUCAP 167 (H) 06/08/2020   GLUCAP 123 (H) 06/05/2020   GLUCAP 130 (H) 06/04/2020   GLUCAP 113 (H) 06/04/2020   GLUCAP 116 (H) 06/04/2020     Exercise Target Goals: Exercise Program Goal: Individual exercise prescription set using results from initial 6 min walk test and THRR while considering  patient's activity barriers and safety.   Exercise Prescription Goal: Starting with aerobic activity 30 plus minutes a day, 3 days per week for initial exercise prescription. Provide home exercise prescription and guidelines that participant acknowledges understanding prior to discharge.  Activity Barriers & Risk Stratification:  Activity Barriers & Cardiac Risk Stratification - 08/23/20 1307       Activity Barriers & Cardiac Risk Stratification   Activity Barriers Back Problems;Joint Problems;Shortness of Breath    Cardiac Risk Stratification High             6 Minute Walk:  6 Minute Walk     Row Name 08/23/20 1428         6 Minute Walk   Distance 1150 feet     Walk Time 6 minutes     # of Rest Breaks 0     MPH 2.18     METS 2.31     RPE 11     VO2 Peak 8.09     Symptoms No              Oxygen Initial Assessment:   Oxygen Re-Evaluation:   Oxygen Discharge (Final Oxygen Re-Evaluation):   Initial Exercise Prescription:  Initial Exercise Prescription - 08/23/20 1400       Date of Initial Exercise  RX and Referring Provider   Date 08/23/20    Referring Provider Dr. Roxy Manns    Expected Discharge Date 11/17/20      Treadmill   MPH 1.8    Grade 0    Minutes 17      NuStep   Level 1  SPM 80    Minutes 22      Prescription Details   Frequency (times per week) 3    Duration Progress to 30 minutes of continuous aerobic without signs/symptoms of physical distress      Intensity   THRR 40-80% of Max Heartrate 60-121    Ratings of Perceived Exertion 11-13    Perceived Dyspnea 0-4      Resistance Training   Training Prescription Yes    Weight 4 lbs    Reps 10-15             Perform Capillary Blood Glucose checks as needed.  Exercise Prescription Changes:   Exercise Prescription Changes     Row Name 09/04/20 1100 09/08/20 1100 09/18/20 1100 09/29/20 1100 10/13/20 1100     Response to Exercise   Blood Pressure (Admit) 110/60 -- 118/62 110/58 150/60   Blood Pressure (Exercise) 138/68 -- 112/62 150/60 140/70   Blood Pressure (Exit) 102/60 -- 108/62 110/55 115/65   Heart Rate (Admit) 67 bpm -- 67 bpm 65 bpm 65 bpm   Heart Rate (Exercise) 87 bpm -- 80 bpm 88 bpm 84 bpm   Heart Rate (Exit) 76 bpm -- 75 bpm 74 bpm 74 bpm   Rating of Perceived Exertion (Exercise) 11 -- 13 12 12    Duration Continue with 30 min of aerobic exercise without signs/symptoms of physical distress. -- Continue with 30 min of aerobic exercise without signs/symptoms of physical distress. Continue with 30 min of aerobic exercise without signs/symptoms of physical distress. Continue with 30 min of aerobic exercise without signs/symptoms of physical distress.   Intensity THRR unchanged -- THRR unchanged THRR unchanged THRR unchanged     Progression   Progression Continue to progress workloads to maintain intensity without signs/symptoms of physical distress. -- Continue to progress workloads to maintain intensity without signs/symptoms of physical distress. Continue to progress workloads to maintain  intensity without signs/symptoms of physical distress. Continue to progress workloads to maintain intensity without signs/symptoms of physical distress.     Resistance Training   Training Prescription Yes -- Yes Yes Yes   Weight 3 lbs -- 4 lbs 4 4   Reps 10-15 -- 10-15 10-15 10-15   Time 10 Minutes -- 10 Minutes 10 Minutes 10 Minutes     Treadmill   MPH 1.8 -- 1.9 2.1 2.1   Grade 0 -- 0 0 0   Minutes 17 -- 17 17 17    METs 2.38 -- 2.45 2.61 2.61     NuStep   Level 1 -- 2 2 2    SPM 80 -- 77 95 101   Minutes 22 -- 22 22 22    METs 1.82 -- 1.98 2.34 2.74     Home Exercise Plan   Plans to continue exercise at -- Home (comment) -- -- --   Frequency -- Add 2 additional days to program exercise sessions. -- -- --   Initial Home Exercises Provided -- 09/08/20 -- -- --            Exercise Comments:   Exercise Comments     Row Name 09/08/20 1131           Exercise Comments home exercise reviewed                Exercise Goals and Review:   Exercise Goals     Row Name 08/23/20 1429 09/19/20 0824 10/16/20 1410         Exercise Goals   Increase Physical Activity  Yes Yes Yes     Intervention Provide advice, education, support and counseling about physical activity/exercise needs.;Develop an individualized exercise prescription for aerobic and resistive training based on initial evaluation findings, risk stratification, comorbidities and participant's personal goals. Provide advice, education, support and counseling about physical activity/exercise needs.;Develop an individualized exercise prescription for aerobic and resistive training based on initial evaluation findings, risk stratification, comorbidities and participant's personal goals. Provide advice, education, support and counseling about physical activity/exercise needs.;Develop an individualized exercise prescription for aerobic and resistive training based on initial evaluation findings, risk stratification,  comorbidities and participant's personal goals.     Expected Outcomes Short Term: Attend rehab on a regular basis to increase amount of physical activity.;Long Term: Add in home exercise to make exercise part of routine and to increase amount of physical activity.;Long Term: Exercising regularly at least 3-5 days a week. Short Term: Attend rehab on a regular basis to increase amount of physical activity.;Long Term: Add in home exercise to make exercise part of routine and to increase amount of physical activity.;Long Term: Exercising regularly at least 3-5 days a week. Short Term: Attend rehab on a regular basis to increase amount of physical activity.;Long Term: Add in home exercise to make exercise part of routine and to increase amount of physical activity.;Long Term: Exercising regularly at least 3-5 days a week.     Increase Strength and Stamina Yes Yes Yes     Intervention Provide advice, education, support and counseling about physical activity/exercise needs.;Develop an individualized exercise prescription for aerobic and resistive training based on initial evaluation findings, risk stratification, comorbidities and participant's personal goals. Provide advice, education, support and counseling about physical activity/exercise needs.;Develop an individualized exercise prescription for aerobic and resistive training based on initial evaluation findings, risk stratification, comorbidities and participant's personal goals. Provide advice, education, support and counseling about physical activity/exercise needs.;Develop an individualized exercise prescription for aerobic and resistive training based on initial evaluation findings, risk stratification, comorbidities and participant's personal goals.     Expected Outcomes Short Term: Increase workloads from initial exercise prescription for resistance, speed, and METs.;Short Term: Perform resistance training exercises routinely during rehab and add in  resistance training at home;Long Term: Improve cardiorespiratory fitness, muscular endurance and strength as measured by increased METs and functional capacity (6MWT) Short Term: Increase workloads from initial exercise prescription for resistance, speed, and METs.;Short Term: Perform resistance training exercises routinely during rehab and add in resistance training at home;Long Term: Improve cardiorespiratory fitness, muscular endurance and strength as measured by increased METs and functional capacity (6MWT) Short Term: Increase workloads from initial exercise prescription for resistance, speed, and METs.;Short Term: Perform resistance training exercises routinely during rehab and add in resistance training at home;Long Term: Improve cardiorespiratory fitness, muscular endurance and strength as measured by increased METs and functional capacity (6MWT)     Able to understand and use rate of perceived exertion (RPE) scale Yes Yes Yes     Intervention Provide education and explanation on how to use RPE scale Provide education and explanation on how to use RPE scale Provide education and explanation on how to use RPE scale     Expected Outcomes Short Term: Able to use RPE daily in rehab to express subjective intensity level;Long Term:  Able to use RPE to guide intensity level when exercising independently Short Term: Able to use RPE daily in rehab to express subjective intensity level;Long Term:  Able to use RPE to guide intensity level when exercising independently Short Term: Able to use  RPE daily in rehab to express subjective intensity level;Long Term:  Able to use RPE to guide intensity level when exercising independently     Knowledge and understanding of Target Heart Rate Range (THRR) Yes Yes Yes     Intervention Provide education and explanation of THRR including how the numbers were predicted and where they are located for reference Provide education and explanation of THRR including how the numbers  were predicted and where they are located for reference Provide education and explanation of THRR including how the numbers were predicted and where they are located for reference     Expected Outcomes Short Term: Able to state/look up THRR;Long Term: Able to use THRR to govern intensity when exercising independently;Short Term: Able to use daily as guideline for intensity in rehab Short Term: Able to state/look up THRR;Long Term: Able to use THRR to govern intensity when exercising independently;Short Term: Able to use daily as guideline for intensity in rehab Short Term: Able to state/look up THRR;Long Term: Able to use THRR to govern intensity when exercising independently;Short Term: Able to use daily as guideline for intensity in rehab     Able to check pulse independently Yes Yes Yes     Intervention Provide education and demonstration on how to check pulse in carotid and radial arteries.;Review the importance of being able to check your own pulse for safety during independent exercise Provide education and demonstration on how to check pulse in carotid and radial arteries.;Review the importance of being able to check your own pulse for safety during independent exercise Provide education and demonstration on how to check pulse in carotid and radial arteries.;Review the importance of being able to check your own pulse for safety during independent exercise     Expected Outcomes Short Term: Able to explain why pulse checking is important during independent exercise;Long Term: Able to check pulse independently and accurately Short Term: Able to explain why pulse checking is important during independent exercise;Long Term: Able to check pulse independently and accurately Short Term: Able to explain why pulse checking is important during independent exercise;Long Term: Able to check pulse independently and accurately     Understanding of Exercise Prescription Yes Yes Yes     Intervention Provide education,  explanation, and written materials on patient's individual exercise prescription Provide education, explanation, and written materials on patient's individual exercise prescription Provide education, explanation, and written materials on patient's individual exercise prescription     Expected Outcomes Short Term: Able to explain program exercise prescription;Long Term: Able to explain home exercise prescription to exercise independently Short Term: Able to explain program exercise prescription;Long Term: Able to explain home exercise prescription to exercise independently Short Term: Able to explain program exercise prescription;Long Term: Able to explain home exercise prescription to exercise independently              Exercise Goals Re-Evaluation :  Exercise Goals Re-Evaluation     [place] Name 09/19/20 0824 10/16/20 1410           Exercise Goal Re-Evaluation   Exercise Goals Review Increase Physical Activity;Increase Strength and Stamina;Able to understand and use rate of perceived exertion (RPE) scale;Knowledge and understanding of Target Heart Rate Range (THRR);Able to check pulse independently;Understanding of Exercise Prescription Increase Physical Activity;Increase Strength and Stamina;Able to understand and use rate of perceived exertion (RPE) scale;Knowledge and understanding of Target Heart Rate Range (THRR);Able to check pulse independently;Understanding of Exercise Prescription      Comments Pt has completed 9 sessions of  cardiac rehab. He frequently arrives late and is not able to do the warm up. I believe that he could push himself harder as well while he is on the stepper. He is currently exercising at 1.98 METs on the stepper. Will continue to monitor and progress as able. Pt has completed 19 sessions of cardiac rehab. He has been arriving on time to complete warm up. He is progressing slowing and could push himself harder on the stepper. He is currently exercising at 2.75 METs on the  stepper. He currently has pneumonia and will take a little time for him to recover.      Expected Outcomes Through exercise at home and at rehab, the patient will meet their stated goals. Through exercise at home and at rehab, the patient will meet their stated goals.                Discharge Exercise Prescription (Final Exercise Prescription Changes):  Exercise Prescription Changes - 10/13/20 1100       Response to Exercise   Blood Pressure (Admit) 150/60    Blood Pressure (Exercise) 140/70    Blood Pressure (Exit) 115/65    Heart Rate (Admit) 65 bpm    Heart Rate (Exercise) 84 bpm    Heart Rate (Exit) 74 bpm    Rating of Perceived Exertion (Exercise) 12    Duration Continue with 30 min of aerobic exercise without signs/symptoms of physical distress.    Intensity THRR unchanged      Progression   Progression Continue to progress workloads to maintain intensity without signs/symptoms of physical distress.      Resistance Training   Training Prescription Yes    Weight 4    Reps 10-15    Time 10 Minutes      Treadmill   MPH 2.1    Grade 0    Minutes 17    METs 2.61      NuStep   Level 2    SPM 101    Minutes 22    METs 2.74             Nutrition:  Target Goals: Understanding of nutrition guidelines, daily intake of sodium 1500mg , cholesterol 200mg , calories 30% from fat and 7% or less from saturated fats, daily to have 5 or more servings of fruits and vegetables.  Biometrics:  Pre Biometrics - 08/23/20 1430       Pre Biometrics   Height 5\' 5"  (1.651 m)    Weight 74.1 kg    Waist Circumference 40 inches    Hip Circumference 40 inches    Waist to Hip Ratio 1 %    BMI (Calculated) 27.18    Triceps Skinfold 15 mm    % Body Fat 27.8 %    Grip Strength 31.6 kg    Flexibility 16 in    Single Leg Stand 14.22 seconds              Nutrition Therapy Plan and Nutrition Goals:  Nutrition Therapy & Goals - 08/23/20 1314       Personal Nutrition  Goals   Comments Patietn scored 42 on his diet assessment. Handout provided and discussed with he and his wife regarding healthier choices. We offer 2 educational sessions on heart healthy nutrition with handouts and RD referral if patient is interested.      Intervention Plan   Intervention Nutrition handout(s) given to patient.             Nutrition Assessments:  Nutrition Assessments - 08/23/20 1314       MEDFICTS Scores   Pre Score 42            MEDIFICTS Score Key: ?70 Need to make dietary changes  40-70 Heart Healthy Diet ? 40 Therapeutic Level Cholesterol Diet   Picture Your Plate Scores: <09 Unhealthy dietary pattern with much room for improvement. 41-50 Dietary pattern unlikely to meet recommendations for good health and room for improvement. 51-60 More healthful dietary pattern, with some room for improvement.  >60 Healthy dietary pattern, although there may be some specific behaviors that could be improved.    Nutrition Goals Re-Evaluation:   Nutrition Goals Discharge (Final Nutrition Goals Re-Evaluation):   Psychosocial: Target Goals: Acknowledge presence or absence of significant depression and/or stress, maximize coping skills, provide positive support system. Participant is able to verbalize types and ability to use techniques and skills needed for reducing stress and depression.  Initial Review & Psychosocial Screening:  Initial Psych Review & Screening - 08/23/20 1400       Initial Review   Current issues with Current Sleep Concerns      Family Dynamics   Good Support System? Yes      Barriers   Psychosocial barriers to participate in program Psychosocial barriers identified (see note)      Screening Interventions   Interventions Encouraged to exercise;To provide support and resources with identified psychosocial needs;Provide feedback about the scores to participant    Expected Outcomes Short Term goal: Identification and review with  participant of any Quality of Life or Depression concerns found by scoring the questionnaire.             Quality of Life Scores:  Quality of Life - 08/23/20 1427       Quality of Life   Select Quality of Life      Quality of Life Scores   Health/Function Pre 23.9 %    Socioeconomic Pre 26.57 %    Psych/Spiritual Pre 24.86 %    Family Pre 28.8 %    GLOBAL Pre 25.37 %            Scores of 19 and below usually indicate a poorer quality of life in these areas.  A difference of  2-3 points is a clinically meaningful difference.  A difference of 2-3 points in the total score of the Quality of Life Index has been associated with significant improvement in overall quality of life, self-image, physical symptoms, and general health in studies assessing change in quality of life.  PHQ-9: Recent Review Flowsheet Data     Depression screen [facility] 2/9 08/23/2020   Decreased Interest 0   Down, Depressed, Hopeless 0   PHQ - 2 Score 0   Altered sleeping 1   Tired, decreased energy 1   Change in appetite 0   Feeling bad or failure about yourself  0   Trouble concentrating 0   Moving slowly or fidgety/restless 0   Suicidal thoughts 0   PHQ-9 Score 2   Difficult doing work/chores Not difficult at all      Interpretation of Total Score  Total Score Depression Severity:  1-4 = Minimal depression, 5-9 = Mild depression, 10-14 = Moderate depression, 15-19 = Moderately severe depression, 20-27 = Severe depression   Psychosocial Evaluation and Intervention:  Psychosocial Evaluation - 08/23/20 1401       Psychosocial Evaluation & Interventions   Interventions Stress management education;Relaxation education;Encouraged to exercise with the program and follow exercise  prescription    Comments Patient has no psychosocial barriers identified to participate in CR. His initial PHQ-9 score was 2 and his QOL score was 25.37 overall. He live with his wife of many years. He has one daughter and 3  grandchildren who are all very supportative and involved in his life. 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 He denies any depression or anxiety or stress. He says he does have trouble falling asleep some nights but he does not take anything. He wants to improve his strength and stamina and SOB with activities and is looking forward to starting the program.    Expected Outcomes Patient will continue to have no psychosocial barriers identified.    Continue Psychosocial Services  No Follow up required             Psychosocial Re-Evaluation:  Psychosocial Re-Evaluation     [place] Name 09/13/20 0932 10/09/20 0947           Psychosocial Re-Evaluation   Current issues with None Identified None Identified      Comments Patient is new to the program completing 6 sessions. He continues to have no psychosocial barriers identified. He seems to enjoy the program and is very interactive with staff and others in his class. We will continue to monitor. Patient has completed 16 sessions. He continues to have no psychosocial barriers identified. He continues to enjoy the program and is very interactive with staff and others in his class. He demonstrates an interest in improving his health. We will continue to monitor.      Expected Outcomes Patient will continue to have no psychosocial barriers identified. Patient will continue to have no psychosocial barriers identified.      Interventions Encouraged to attend Cardiac Rehabilitation for the exercise;Stress management education;Relaxation education Encouraged to attend Cardiac Rehabilitation for the exercise;Stress management education;Relaxation education      Continue Psychosocial Services  No Follow up required No Follow up required               Psychosocial Discharge (Final Psychosocial Re-Evaluation):  Psychosocial Re-Evaluation - 10/09/20 0947       Psychosocial Re-Evaluation   Current issues with None Identified    Comments  Patient has completed 16 sessions. He continues to have no psychosocial barriers identified. He continues to enjoy the program and is very interactive with staff and others in his class. He demonstrates an interest in improving his health. We will continue to monitor.    Expected Outcomes Patient will continue to have no psychosocial barriers identified.    Interventions Encouraged to attend Cardiac Rehabilitation for the exercise;Stress management education;Relaxation education    Continue Psychosocial Services  No Follow up required             Vocational Rehabilitation: Provide vocational rehab assistance to qualifying candidates.   Vocational Rehab Evaluation & Intervention:  Vocational Rehab - 08/23/20 1315       Initial Vocational Rehab Evaluation & Intervention   Assessment shows need for Vocational Rehabilitation No      Vocational Rehab Re-Evaulation   Comments Remigio Eisenmenger is retired and does not need vocational rehab.             Education: Education Goals: Education classes will be provided on a weekly basis, covering required topics. Participant will state understanding/return demonstration of topics presented.  Learning Barriers/Preferences:  Learning Barriers/Preferences - 08/23/20 1358       Learning Barriers/Preferences   Learning Barriers None  Learning Preferences Skilled Demonstration             Education Topics: Hypertension, Hypertension Reduction -Define heart disease and high blood pressure. Discus how high blood pressure affects the body and ways to reduce high blood pressure. Flowsheet Row CARDIAC REHAB PHASE II EXERCISE from 10/11/2020 in [facility]  Date 10/11/20  Educator [place]  Instruction Review Code 2- Demonstrated Understanding       Exercise and Your Heart -Discuss why it is important to exercise, the FITT principles of exercise, normal and abnormal responses to exercise, and how to exercise  safely.   Angina -Discuss definition of angina, causes of angina, treatment of angina, and how to decrease risk of having angina.   Cardiac Medications -Review what the following cardiac medications are used for, how they affect the body, and side effects that may occur when taking the medications.  Medications include Aspirin, Beta blockers, calcium channel blockers, ACE Inhibitors, angiotensin receptor blockers, diuretics, digoxin, and antihyperlipidemics.   Congestive Heart Failure -Discuss the definition of CHF, how to live with CHF, the signs and symptoms of CHF, and how keep track of weight and sodium intake.   Heart Disease and Intimacy -Discus the effect sexual activity has on the heart, how changes occur during intimacy as we age, and safety during sexual activity.   Smoking Cessation / COPD -Discuss different methods to quit smoking, the health benefits of quitting smoking, and the definition of COPD.   Nutrition I: Fats -Discuss the types of cholesterol, what cholesterol does to the heart, and how cholesterol levels can be controlled. Flowsheet Row CARDIAC REHAB PHASE II EXERCISE from 10/11/2020 in [place]  Date 08/30/20  Educator DJ  Instruction Review Code 1- Verbalizes Understanding       Nutrition II: Labels -Discuss the different components of food labels and how to read food label [place] from 10/11/2020 in [employer]  Date 09/13/20  Educator DF  Instruction Review Code 2- Demonstrated Understanding       Heart Parts/Heart Disease and PAD -Discuss the anatomy of the heart, the pathway of blood circulation through the heart, and these are affected by heart disease.   Stress I: Signs and Symptoms -Discuss the causes of stress, how stress may lead to anxiety and depression, and ways to limit stress. Flowsheet Row CARDIAC REHAB PHASE II EXERCISE from 10/11/2020 in [place]  Date 09/27/20  Educator hj  Instruction Review Code 2- Demonstrated Understanding       Stress II: Relaxation -Discuss different types of relaxation techniques to limit stress. Flowsheet Row CARDIAC REHAB PHASE II EXERCISE from 10/11/2020 in [place]  Date 10/04/20  Educator DF  Instruction Review Code 2- Demonstrated Understanding       Warning Signs of Stroke / TIA -Discuss definition of a stroke, what the signs and symptoms are of a stroke, and how to identify when someone is having stroke.   Knowledge Questionnaire Score:  Knowledge Questionnaire Score - 08/23/20 1358       Knowledge Questionnaire Score   Pre Score 20/24             Core Components/Risk Factors/Patient Goals at Admission:  Personal Goals and Risk Factors at Admission - 08/23/20 1358       Core Components/Risk Factors/Patient Goals on Admission    Weight Management Weight Maintenance    Improve shortness of breath with ADL's Yes  Intervention Provide education, individualized exercise plan and daily activity instruction to help decrease symptoms of SOB with activities of daily living.    Expected Outcomes Short Term: Improve cardiorespiratory fitness to achieve a reduction of symptoms when performing ADLs;Long Term: Be able to perform more ADLs without symptoms or delay the onset of symptoms    Personal Goal Other Yes    Personal Goal Increase strength and stamina. Improve SOB with activities.    Intervention Patient will attend CR 3 days/week and supplement with exercise 2 days/week at home.    Expected Outcomes Patient will meeting both program and personal goals.             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 He has multiple risk factors for CAD and is participating in the program for risk modification. He has completed 6 sessions maintaining his weight since his initial visit. His blood pressure is well controlled. His personal goals for the program are to increase his strength and stamina and improve his SOB with activities. We will continue to monitor his progress as he works towards meeting these goals. Patient has completed 16 sessions gaining 2 lbs since last 30 day review. He is doing well in the program with consistent attendance and progression. His blood pressure is well controlled at rest and during exercise. He saw his cardiologist 9/29 for a routine f/u visit. No changes made. Lipid panel ordered but not completed yet. Patient's personal goals for the program are to increase his strength and stamina and improve his SOB with activities. We will continue to monitor his progress as he works towards meeting these goals.      Expected Outcomes Patient will complete the program meeting both personal and program goals. Patient will complete the program meeting both personal and program goals.               Core Components/Risk Factors/Patient Goals at Discharge (Final Review):   Goals and Risk Factor Review - 10/09/20 0949       Core Components/Risk Factors/Patient Goals Review   Personal Goals Review Improve shortness of breath with ADL's;Other    Review Patient has completed 16 sessions gaining 2 lbs since last 30 day review. He is doing well in the program with consistent attendance and progression. His blood pressure is well controlled at rest and during exercise. He saw his cardiologist 9/29 for a routine f/u visit. No changes made. Lipid panel ordered but not completed yet. Patient's personal goals for the program are to increase his strength and stamina and improve his SOB with activities. We  will continue to monitor his progress as he works towards meeting these goals.    Expected Outcomes Patient will complete the program meeting both personal and program goals.             ITP Comments:   Comments: ITP REVIEW Pt is making expected progress toward Cardiac Rehab goals after completing 19 sessions. Recommend continued exercise, life style modification, education, and increased stamina and strength.

## 2020-10-18 NOTE — Discharge Instructions (Signed)
IMPORTANT INFORMATION: PAY CLOSE ATTENTION   PHYSICIAN DISCHARGE INSTRUCTIONS  Follow with Primary care provider  Sherrilee Gilles, DO  and other consultants as instructed by your Hospitalist Physician  [place] IF SYMPTOMS COME BACK, WORSEN OR NEW PROBLEM DEVELOPS   Please note: You were cared for by a hospitalist during your hospital stay. Every effort will be made to forward records to your primary care provider.  You can request that your primary care provider send for your hospital records if they have not received them.  Once you are discharged, your primary care physician will handle any further medical issues. Please note that NO REFILLS for any discharge medications will be authorized once you are discharged, as it is imperative that you return to your primary care physician (or establish a relationship with a primary care physician if you do not have one) for your post hospital discharge needs so that they can reassess your need for medications and monitor your lab values.  Please get a complete blood count and chemistry panel checked by your Primary MD at your next visit, and again as instructed by your Primary MD.  Get Medicines reviewed and adjusted: Please take all your medications with you for your next visit with your Primary MD  Laboratory/radiological data: Please request your Primary MD to go over all hospital tests and procedure/radiological results at the follow up, please ask your primary care provider to get all Hospital records sent to his/her office.  In some cases, they will be blood work, cultures and biopsy results pending at the time of your discharge. Please request that your primary care provider follow up on these results.  If you are diabetic, please bring your blood sugar readings with you to your follow up appointment with primary care.    Please call and make your follow up appointments as soon as possible.    Also Note  the following: If you experience worsening of your admission symptoms, develop shortness of breath, life threatening emergency, suicidal or homicidal thoughts you must seek medical attention immediately by calling 911 or calling your MD immediately  if symptoms less severe.  You must read complete instructions/literature along with all the possible adverse reactions/side effects for all the Medicines you take and that have been prescribed to you. Take any new Medicines after you have completely understood and accpet all the possible adverse reactions/side effects.   Do not drive when taking Pain medications or sleeping medications (Benzodiazepines)  Do not take more than prescribed Pain, Sleep and Anxiety Medications. It is not advisable to combine anxiety,sleep and pain medications without talking with your primary care practitioner  Special Instructions: If you have smoked or chewed Tobacco  in the last 2 yrs please stop smoking, stop any regular Alcohol  and or any Recreational drug use.  Wear Seat belts while driving.  Do not drive if taking any narcotic, mind altering or controlled substances or recreational drugs or alcohol.

## 2020-10-20 ENCOUNTER — Encounter (HOSPITAL_COMMUNITY): Payer: Medicare Other

## 2020-10-23 ENCOUNTER — Encounter (HOSPITAL_COMMUNITY): Payer: Medicare Other

## 2020-10-25 ENCOUNTER — Encounter (HOSPITAL_COMMUNITY)
Admission: RE | Admit: 2020-10-25 | Discharge: 2020-10-25 | Disposition: A | Discharge status: Home / Self Care | Payer: Medicare Other | Source: Ambulatory Visit | Attending: Cardiology | Admitting: Cardiology

## 2020-10-25 DIAGNOSIS — I214 Non-ST elevation (NSTEMI) myocardial infarction: Secondary | ICD-10-CM

## 2020-10-25 DIAGNOSIS — Z951 Presence of aortocoronary bypass graft: Secondary | ICD-10-CM | POA: Diagnosis not present

## 2020-10-25 NOTE — Progress Notes (Signed)
Daily Session Note  Patient Details  Name: James Wolf MRN: [id number] Date of Birth: [birth year] Referring Provider:   Flowsheet Row CARDIAC REHAB PHASE II ORIENTATION from 08/23/2020 in [place]  Referring Provider Dr. Roxy Manns       Encounter Date: 10/25/2020  Check In:  Session Check In - 10/25/20 1100       Check-In   Supervising physician immediately available to respond to emergencies [facility] MD immediately available    Physician(s) Dr. Domenic Polite    Location [facility]    Staff Present Redge Gainer, BS, Exercise Physiologist;Debra Wynetta Emery, RN, BSN    Virtual Visit No    Medication changes reported     No    Fall or balance concerns reported    No    Tobacco Cessation No Change    Warm-up and Cool-down Performed as group-led instruction    Resistance Training Performed Yes    VAD Patient? No    PAD/SET Patient? No      Pain Assessment   Currently in Pain? No/denies    Pain Score 0-No pain    Multiple Pain Sites No             Capillary Blood Glucose: No results found for this or any previous visit (from the past 24 hour(s)).    Social History   Tobacco Use  Smoking Status Former   Packs/day: 1.00   Years: 25.00   Pack years: 25.00   Types: Cigarettes   Quit date: 02/19/1993   Years since quitting: 27.6  Smokeless Tobacco Never    Goals Met:  Independence with exercise equipment Exercise tolerated well No report of concerns or symptoms today Strength training completed today  Goals Unmet:  Not Applicable  Comments: check out 1200   Dr. Kathie Dike is Medical Director for [facility].

## 2020-10-27 ENCOUNTER — Other Ambulatory Visit: Payer: Self-pay

## 2020-10-27 ENCOUNTER — Encounter (HOSPITAL_COMMUNITY)
Admission: RE | Admit: 2020-10-27 | Discharge: 2020-10-27 | Disposition: A | Discharge status: Home / Self Care | Payer: Medicare Other | Source: Ambulatory Visit | Attending: Cardiology | Admitting: Cardiology

## 2020-10-27 DIAGNOSIS — Z951 Presence of aortocoronary bypass graft: Secondary | ICD-10-CM

## 2020-10-27 DIAGNOSIS — I214 Non-ST elevation (NSTEMI) myocardial infarction: Secondary | ICD-10-CM

## 2020-10-27 NOTE — Progress Notes (Signed)
Daily Session Note  Patient Details  Name: James Wolf MRN: [id number] Date of Birth: [birth year] Referring Provider:   Flowsheet Row CARDIAC REHAB PHASE II ORIENTATION from 08/23/2020 in [place]  Referring Provider Dr. Roxy Manns       Encounter Date: 10/27/2020  Check In:  Session Check In - 10/27/20 1100       Check-In   Supervising physician immediately available to respond to emergencies [facility] MD immediately available    Physician(s) Dr. Domenic Polite    Location [facility]    Staff Present Hoy Register, MS, ACSM-CEP, Exercise Physiologist;Jahaan Vanwagner Zigmund Daniel, Exercise Physiologist    Virtual Visit No    Medication changes reported     No    Fall or balance concerns reported    No    Tobacco Cessation No Change    Warm-up and Cool-down Performed as group-led instruction    Resistance Training Performed Yes    VAD Patient? No    PAD/SET Patient? No      Pain Assessment   Currently in Pain? No/denies    Pain Score 0-No pain    Multiple Pain Sites No             Capillary Blood Glucose: No results found for this or any previous visit (from the past 24 hour(s)).    Social History   Tobacco Use  Smoking Status Former   Packs/day: 1.00   Years: 25.00   Pack years: 25.00   Types: Cigarettes   Quit date: 02/19/1993   Years since quitting: 27.7  Smokeless Tobacco Never    Goals Met:  Independence with exercise equipment Exercise tolerated well No report of concerns or symptoms today Strength training completed today  Goals Unmet:  Not Applicable  Comments: check out 1200   Dr. Kathie Dike is Medical Director for [facility].

## 2020-10-30 ENCOUNTER — Encounter (HOSPITAL_COMMUNITY)
Admission: RE | Admit: 2020-10-30 | Discharge: 2020-10-30 | Disposition: A | Discharge status: Home / Self Care | Payer: Medicare Other | Source: Ambulatory Visit | Attending: Cardiology | Admitting: Cardiology

## 2020-10-30 VITALS — Wt 163.6 lb

## 2020-10-30 DIAGNOSIS — I214 Non-ST elevation (NSTEMI) myocardial infarction: Secondary | ICD-10-CM

## 2020-10-30 DIAGNOSIS — Z951 Presence of aortocoronary bypass graft: Secondary | ICD-10-CM | POA: Diagnosis not present

## 2020-10-30 NOTE — Progress Notes (Signed)
Daily Session Note  Patient Details  Name: James Wolf MRN: [id number] Date of Birth: 1950/05/17 Referring Provider:   Flowsheet Row CARDIAC REHAB PHASE II ORIENTATION from 08/23/2020 in [place]  Referring Provider Dr. Roxy Manns       Encounter Date: 10/30/2020  Check In:  Session Check In - 10/30/20 1100       Check-In   Supervising physician immediately available to respond to emergencies [facility] MD immediately available    Physician(s) Dr. Harrington Challenger    Location [facility]    Staff Present Geanie Cooley, RN;Dalton Fletcher, MS, ACSM-CEP, Exercise Physiologist    Virtual Visit No    Medication changes reported     No    Fall or balance concerns reported    No    Tobacco Cessation No Change    Warm-up and Cool-down Performed as group-led instruction    Resistance Training Performed Yes    VAD Patient? No    PAD/SET Patient? No      Pain Assessment   Currently in Pain? No/denies    Pain Score 0-No pain    Multiple Pain Sites No             Capillary Blood Glucose: No results found for this or any previous visit (from the past 24 hour(s)).    Social History   Tobacco Use  Smoking Status Former   Packs/day: 1.00   Years: 25.00   Pack years: 25.00   Types: Cigarettes   Quit date: 02/19/1993   Years since quitting: 27.7  Smokeless Tobacco Never    Goals Met:  Independence with exercise equipment Exercise tolerated well No report of concerns or symptoms today Strength training completed today  Goals Unmet:  Not Applicable  Comments: check out @ 12:00pm   Dr. Kathie Dike is Medical Director for [facility] Pul[facility] Re[facility].

## 2020-11-01 ENCOUNTER — Encounter (HOSPITAL_COMMUNITY)
Admission: RE | Admit: 2020-11-01 | Discharge: 2020-11-01 | Disposition: A | Discharge status: Home / Self Care | Payer: Medicare Other | Source: Ambulatory Visit | Attending: Cardiology | Admitting: Cardiology

## 2020-11-01 DIAGNOSIS — Z951 Presence of aortocoronary bypass graft: Secondary | ICD-10-CM

## 2020-11-01 DIAGNOSIS — I214 Non-ST elevation (NSTEMI) myocardial infarction: Secondary | ICD-10-CM

## 2020-11-01 NOTE — Progress Notes (Signed)
Daily Session Note  Patient Details  Name: James Wolf MRN: [id number] Date of Birth: January 22, 1950 Referring Provider:   Flowsheet Row CARDIAC REHAB PHASE II ORIENTATION from 08/23/2020 in [place]  Referring Provider Dr. Roxy Manns       Encounter Date: 11/01/2020  Check In:  Session Check In - 11/01/20 1100       Check-In   Supervising physician immediately available to respond to emergencies [facility] MD immediately available    Physician(s) Dr. Harl Bowie    Location [facility]    Staff Present Hoy Register, MS, ACSM-CEP, Exercise Physiologist;Ashari Llewellyn Zigmund Daniel, Exercise Physiologist    Virtual Visit No    Medication changes reported     No    Fall or balance concerns reported    No    Tobacco Cessation No Change    Warm-up and Cool-down Performed as group-led instruction    Resistance Training Performed Yes    VAD Patient? No    PAD/SET Patient? No      Pain Assessment   Currently in Pain? No/denies    Pain Score 0-No pain    Multiple Pain Sites No             Capillary Blood Glucose: No results found for this or any previous visit (from the past 24 hour(s)).    Social History   Tobacco Use  Smoking Status Former   Packs/day: 1.00   Years: 25.00   Pack years: 25.00   Types: Cigarettes   Quit date: 02/19/1993   Years since quitting: 27.7  Smokeless Tobacco Never    Goals Met:  Independence with exercise equipment Exercise tolerated well No report of concerns or symptoms today Strength training completed today  Goals Unmet:  Not Applicable  Comments: check out 1200   Dr. Kathie Dike is Medical Director for [facility] Re[facility].

## 2020-11-03 ENCOUNTER — Encounter (HOSPITAL_COMMUNITY)
Admission: RE | Admit: 2020-11-03 | Discharge: 2020-11-03 | Disposition: A | Discharge status: Home / Self Care | Payer: Medicare Other | Source: Ambulatory Visit | Attending: Cardiology | Admitting: Cardiology

## 2020-11-03 ENCOUNTER — Other Ambulatory Visit: Payer: Self-pay

## 2020-11-03 DIAGNOSIS — I214 Non-ST elevation (NSTEMI) myocardial infarction: Secondary | ICD-10-CM

## 2020-11-03 DIAGNOSIS — Z951 Presence of aortocoronary bypass graft: Secondary | ICD-10-CM

## 2020-11-03 NOTE — Progress Notes (Signed)
Daily Session Note  Patient Details  Name: KAMAREE WHEATLEY MRN: [id number] Date of Birth: 1950/10/23 Referring Provider:   Flowsheet Row CARDIAC REHAB PHASE II ORIENTATION from 08/23/2020 in [place]  Referring Provider Dr. Roxy Manns       Encounter Date: 11/03/2020  Check In:  Session Check In - 11/03/20 1100       Check-In   Supervising physician immediately available to respond to emergencies [facility] MD immediately available    Physician(s) Dr. Harl Bowie    Location [facility]    Staff Present Hoy Register, MS, ACSM-CEP, Exercise Physiologist;Other    Virtual Visit No    Medication changes reported     No    Fall or balance concerns reported    No    Tobacco Cessation No Change    Warm-up and Cool-down Performed as group-led instruction    Resistance Training Performed Yes    VAD Patient? No    PAD/SET Patient? No      Pain Assessment   Currently in Pain? No/denies    Pain Score 0-No pain    Multiple Pain Sites No             Capillary Blood Glucose: No results found for this or any previous visit (from the past 24 hour(s)).    Social History   Tobacco Use  Smoking Status Former   Packs/day: 1.00   Years: 25.00   Pack years: 25.00   Types: Cigarettes   Quit date: 02/19/1993   Years since quitting: 27.7  Smokeless Tobacco Never    Goals Met:  Independence with exercise equipment Exercise tolerated well No report of concerns or symptoms today Strength training completed today  Goals Unmet:  Not Applicable  Comments: checkout time is 1200   Dr. Kathie Dike is Medical Director for [facility].

## 2020-11-06 ENCOUNTER — Other Ambulatory Visit: Payer: Self-pay

## 2020-11-06 ENCOUNTER — Encounter (HOSPITAL_COMMUNITY)
Admission: RE | Admit: 2020-11-06 | Discharge: 2020-11-06 | Disposition: A | Discharge status: Home / Self Care | Payer: Medicare Other | Source: Ambulatory Visit | Attending: Cardiology | Admitting: Cardiology

## 2020-11-06 DIAGNOSIS — Z951 Presence of aortocoronary bypass graft: Secondary | ICD-10-CM

## 2020-11-06 DIAGNOSIS — I214 Non-ST elevation (NSTEMI) myocardial infarction: Secondary | ICD-10-CM

## 2020-11-06 NOTE — Progress Notes (Signed)
Daily Session Note  Patient Details  Name: James Wolf MRN: [id number] Date of Birth: 07-28-1950 Referring Provider:   Flowsheet Row CARDIAC REHAB PHASE II ORIENTATION from 08/23/2020 in [place]  Referring Provider Dr. Roxy Manns       Encounter Date: 11/06/2020  Check In:  Session Check In - 11/06/20 1100       Check-In   Supervising physician immediately available to respond to emergencies [facility] MD immediately available    Physician(s) Dr. Domenic Polite    [facility]    Staff Present Hoy Register, MS, ACSM-CEP, Exercise Physiologist;Debra Wynetta Emery, RN, BSN    Virtual Visit No    Medication changes reported     No    Fall or balance concerns reported    No    Tobacco Cessation No Change    Warm-up and Cool-down Performed as group-led instruction    Resistance Training Performed Yes    VAD Patient? No    PAD/SET Patient? No      Pain Assessment   Currently in Pain? No/denies    Pain Score 0-No pain    Multiple Pain Sites No             Capillary Blood Glucose: No results found for this or any previous visit (from the past 24 hour(s)).    Social History   Tobacco Use  Smoking Status Former   Packs/day: 1.00   Years: 25.00   Pack years: 25.00   Types: Cigarettes   Quit date: 02/19/1993   Years since quitting: 27.7  Smokeless Tobacco Never    Goals Met:  Independence with exercise equipment Exercise tolerated well No report of concerns or symptoms today Strength training completed today  Goals Unmet:  Not Applicable  Comments: checkout time is 1200   Dr. Kathie Dike is Medical Director for [facility] Pulmonary Re[facility].

## 2020-11-08 ENCOUNTER — Encounter (HOSPITAL_COMMUNITY)
Admission: RE | Admit: 2020-11-08 | Discharge: 2020-11-08 | Disposition: A | Discharge status: Home / Self Care | Payer: Medicare Other | Source: Ambulatory Visit | Attending: Cardiology | Admitting: Cardiology

## 2020-11-08 DIAGNOSIS — I214 Non-ST elevation (NSTEMI) myocardial infarction: Secondary | ICD-10-CM | POA: Insufficient documentation

## 2020-11-08 DIAGNOSIS — Z951 Presence of aortocoronary bypass graft: Secondary | ICD-10-CM | POA: Insufficient documentation

## 2020-11-08 NOTE — Progress Notes (Signed)
Daily Session Note  Patient Details  Name: James Wolf MRN: [id number] Date of Birth: Jul 19, 1950 Referring Provider:   Flowsheet Row CARDIAC REHAB PHASE II ORIENTATION from 08/23/2020 in [place]  Referring Provider Dr. Roxy Manns       Encounter Date: 11/08/2020  Check In:  Session Check In - 11/08/20 1100       Check-In   Supervising physician immediately available to respond to emergencies [facility] MD immediately available    Physician(s) Dr. Marisue Ivan    Location [facility]    Staff Present Hoy Register, MS, ACSM-CEP, Exercise Physiologist;Jeyda Siebel Wynetta Emery, RN, BSN    Virtual Visit No    Medication changes reported     No    Fall or balance concerns reported    No    Tobacco Cessation No Change    Warm-up and Cool-down Performed as group-led instruction    Resistance Training Performed Yes    VAD Patient? No    PAD/SET Patient? No      Pain Assessment   Currently in Pain? No/denies    Pain Score 0-No pain    Multiple Pain Sites No             Capillary Blood Glucose: No results found for this or any previous visit (from the past 24 hour(s)).    Social History   Tobacco Use  Smoking Status Former   Packs/day: 1.00   Years: 25.00   Pack years: 25.00   Types: Cigarettes   Quit date: 02/19/1993   Years since quitting: 27.7  Smokeless Tobacco Never    Goals Met:  Independence with exercise equipment Exercise tolerated well No report of concerns or symptoms today Strength training completed today  Goals Unmet:  Not Applicable  Comments: Check out 1200.   Dr. Kathie Dike is Medical Director for [facility] Re[facility].

## 2020-11-10 ENCOUNTER — Encounter (HOSPITAL_COMMUNITY)
Admission: RE | Admit: 2020-11-10 | Discharge: 2020-11-10 | Disposition: A | Discharge status: Home / Self Care | Payer: Medicare Other | Source: Ambulatory Visit | Attending: Cardiology | Admitting: Cardiology

## 2020-11-10 DIAGNOSIS — Z951 Presence of aortocoronary bypass graft: Secondary | ICD-10-CM | POA: Diagnosis not present

## 2020-11-10 DIAGNOSIS — I214 Non-ST elevation (NSTEMI) myocardial infarction: Secondary | ICD-10-CM

## 2020-11-10 NOTE — Progress Notes (Signed)
Daily Session Note  Patient Details  Name: James Wolf MRN: [id number] Date of Birth: 1950-09-15 Referring Provider:   Flowsheet Row CARDIAC REHAB PHASE II ORIENTATION from 08/23/2020 in [place]  Referring Provider Dr. Roxy Manns       Encounter Date: 11/10/2020  Check In:  Session Check In - 11/10/20 1100       Check-In   Supervising physician immediately available to respond to emergencies [facility] MD immediately available    Physician(s) Dr. Domenic Polite    Location [facility]    Staff Present Hoy Register, MS, ACSM-CEP, Exercise Physiologist;Debra Wynetta Emery, RN, BSN    Virtual Visit No    Medication changes reported     No    Fall or balance concerns reported    No    Tobacco Cessation No Change    Warm-up and Cool-down Performed as group-led instruction    Resistance Training Performed Yes    VAD Patient? No    PAD/SET Patient? No      Pain Assessment   Currently in Pain? No/denies    Pain Score 0-No pain    Multiple Pain Sites No             Capillary Blood Glucose: No results found for this or any previous visit (from the past 24 hour(s)).    Social History   Tobacco Use  Smoking Status Former   Packs/day: 1.00   Years: 25.00   Pack years: 25.00   Types: Cigarettes   Quit date: 02/19/1993   Years since quitting: 27.7  Smokeless Tobacco Never    Goals Met:  Independence with exercise equipment Exercise tolerated well No report of concerns or symptoms today Strength training completed today  Goals Unmet:  Not Applicable  Comments: checkout time is 1200   Dr. Kathie Dike is Medical Director for [facility].

## 2020-11-13 ENCOUNTER — Encounter (HOSPITAL_COMMUNITY)
Admission: RE | Admit: 2020-11-13 | Discharge: 2020-11-13 | Disposition: A | Discharge status: Home / Self Care | Payer: Medicare Other | Source: Ambulatory Visit | Attending: Cardiology | Admitting: Cardiology

## 2020-11-13 VITALS — Wt 163.4 lb

## 2020-11-13 DIAGNOSIS — I214 Non-ST elevation (NSTEMI) myocardial infarction: Secondary | ICD-10-CM

## 2020-11-13 DIAGNOSIS — Z951 Presence of aortocoronary bypass graft: Secondary | ICD-10-CM | POA: Diagnosis not present

## 2020-11-13 NOTE — Progress Notes (Signed)
Daily Session Note  Patient Details  Name: James Wolf MRN: [id number] Date of Birth: 12-Jan-1950 Referring Provider:   Flowsheet Row CARDIAC REHAB PHASE II ORIENTATION from 08/23/2020 in [place]  Referring Provider Dr. Roxy Manns       Encounter Date: 11/13/2020  Check In:  Session Check In - 11/13/20 1100       Check-In   Supervising physician immediately available to respond to emergencies [facility] MD immediately available    Physician(s) Dr. Gasper Sells    Location [facility]    Staff Present Hoy Register, MS, ACSM-CEP, Exercise Physiologist;Elonzo Sopp Zigmund Daniel, Exercise Physiologist    Virtual Visit No    Medication changes reported     No    Fall or balance concerns reported    No    Tobacco Cessation No Change    Warm-up and Cool-down Performed as group-led instruction    Resistance Training Performed Yes    VAD Patient? No    PAD/SET Patient? No      Pain Assessment   Currently in Pain? No/denies    Pain Score 0-No pain    Multiple Pain Sites No             Capillary Blood Glucose: No results found for this or any previous visit (from the past 24 hour(s)).    Social History   Tobacco Use  Smoking Status Former   Packs/day: 1.00   Years: 25.00   Pack years: 25.00   Types: Cigarettes   Quit date: 02/19/1993   Years since quitting: 27.7  Smokeless Tobacco Never    Goals Met:  Independence with exercise equipment Exercise tolerated well No report of concerns or symptoms today Strength training completed today  Goals Unmet:  Not Applicable  Comments: check out 1200   Dr. Kathie Dike is Medical Director for [facility].

## 2020-11-15 ENCOUNTER — Encounter (HOSPITAL_COMMUNITY)
Admission: RE | Admit: 2020-11-15 | Discharge: 2020-11-15 | Disposition: A | Discharge status: Home / Self Care | Payer: Medicare Other | Source: Ambulatory Visit | Attending: Cardiology | Admitting: Cardiology

## 2020-11-15 DIAGNOSIS — I214 Non-ST elevation (NSTEMI) myocardial infarction: Secondary | ICD-10-CM

## 2020-11-15 DIAGNOSIS — Z951 Presence of aortocoronary bypass graft: Secondary | ICD-10-CM | POA: Diagnosis not present

## 2020-11-15 NOTE — Progress Notes (Signed)
Cardiac Individual Treatment Plan  Patient Details  Name: James Wolf MRN: [id number] Date of Birth: [birth year] Referring Provider:   Flowsheet Row CARDIAC REHAB PHASE II ORIENTATION from 08/23/2020 in [facility]  Referring Provider Dr. Roxy Manns       Initial Encounter Date:  Flowsheet Row CARDIAC REHAB PHASE II ORIENTATION from 08/23/2020 in [facility]  Date 08/23/20       Visit Diagnosis: S/P CABG x 3  NSTEMI (non-ST elevated myocardial infarction) ([place])  Patient's Home Medications on Admission:  Current Outpatient Medications:    albuterol (VENTOLIN HFA) 108 (90 Base) MCG/ACT inhaler, Inhale 1-2 puffs into the lungs every 6 (six) hours as needed for wheezing or shortness of breath., Disp: , Rfl:    amoxicillin-clavulanate (AUGMENTIN) 875-125 MG tablet, Take 1 tablet by mouth 2 (two) times daily., Disp: 8 tablet, Rfl: 0   aspirin EC 81 MG EC tablet, Take 1 tablet (81 mg total) by mouth daily. Swallow whole., Disp: 30 tablet, Rfl: 11   B Complex Vitamins (VITAMIN B COMPLEX PO), Take 1 tablet by mouth daily. , Disp: , Rfl:    cholecalciferol (VITAMIN D3) 25 MCG (1000 UNIT) tablet, Take 1,000 Units by mouth daily., Disp: , Rfl:    clopidogrel (PLAVIX) 75 MG tablet, Take 1 tablet (75 mg total) by mouth daily., Disp: 30 tablet, Rfl: 11   diclofenac sodium (VOLTAREN) 1 % GEL, Apply 2 g topically 2 (two) times daily as needed (pain). , Disp: , Rfl:    doxycycline (VIBRA-TABS) 100 MG tablet, Take 1 tablet (100 mg total) by mouth 2 (two) times daily., Disp: 8 tablet, Rfl: 0   esomeprazole (NEXIUM) 40 MG capsule, Take 40 mg by mouth 2 (two) times daily. , Disp: , Rfl:    ezetimibe (ZETIA) 10 MG tablet, Take 1 tablet (10 mg total) by mouth daily., Disp: 90 tablet, Rfl: 1   metoprolol tartrate (LOPRESSOR) 100 MG tablet, Take 1 tablet (100 mg total) by mouth 2 (two) times daily. (Patient taking differently: Take 50 mg by mouth 2 (two) times  daily.), Disp: 60 tablet, Rfl: 3   rosuvastatin (CRESTOR) 40 MG tablet, Take 1 tablet (40 mg total) by mouth daily at 6 PM., Disp: 30 tablet, Rfl: 3   tamsulosin (FLOMAX) 0.4 MG CAPS capsule, Take 0.4 mg by mouth daily., Disp: , Rfl:   Past Medical History: Past Medical History:  Diagnosis Date   Anemia    Aspiration pneumonia (HCC)    CAD (coronary artery disease)    a. s/p CABG 05/2020.   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   LIMA to LAD, SVG to OM, SVG to RCA    Tobacco Use: Social History   Tobacco Use  Smoking Status Former   Packs/day: 1.00   Years: 25.00   Pack years: 25.00   Types: Cigarettes   Quit date: 02/19/1993   Years since quitting: 27.7  Smokeless Tobacco Never    Labs: Recent Review Scientist, physiological     Labs for ITP  Cardiac and Pulmonary Rehab Latest Ref Rng & Units 06/02/2020 06/02/2020 06/02/2020 10/12/2020 10/15/2020   Cholestrol 0 - 200 mg/dL - - - 78 -   LDLCALC 0 - 99 mg/dL - - - 21 -   HDL >40 mg/dL - - - 46 -   Trlycerides <150 mg/dL - - - 55 -   Hemoglobin A1c 4.8 - 5.6 % - - - - -   PHART 7.350 - 7.450 7.342(L) 7.358 7.342(L) - 7.460(H)   PCO2ART 32.0 - 48.0 mmHg 42.3 43.1 44.4 - 29.8(L)   HCO3 20.0 - 28.0 mmol/L 23.3 24.3 24.0 - 22.8   TCO2 22 - 32 mmol/L 25 26 25  - -   ACIDBASEDEF 0.0 - 2.0 mmol/L 3.0(H) 1.0 2.0 - 2.4(H)   O2SAT % 96.0 96.0 99.0 - 95.1       Capillary Blood Glucose: Lab Results  Component Value Date   GLUCAP 167 (H) 06/08/2020   GLUCAP 123 (H) 06/05/2020   GLUCAP 130 (H) 06/04/2020   GLUCAP 113 (H) 06/04/2020   GLUCAP 116 (H) 06/04/2020     Exercise Target  Goals: Exercise Program Goal: Individual exercise prescription set using results from initial 6 min walk test and THRR while considering  patient's activity barriers and safety.   Exercise Prescription Goal: Starting with aerobic activity 30 plus minutes a day, 3 days per week for initial exercise prescription. Provide home exercise prescription and guidelines that participant acknowledges understanding prior to discharge.  Activity Barriers & Risk Stratification:  Activity Barriers & Cardiac Risk Stratification - 08/23/20 1307       Activity Barriers & Cardiac Risk Stratification   Activity Barriers Back Problems;Joint Problems;Shortness of Breath    Cardiac Risk Stratification High             6 Minute Walk:  6 Minute Walk     Row Name 08/23/20 1428         6 Minute Walk   Distance 1150 feet     Walk Time 6 minutes     # of Rest Breaks 0     MPH 2.18     METS 2.31     RPE 11     VO2 Peak 8.09     Symptoms No              Oxygen Initial Assessment:   Oxygen Re-Evaluation:   Oxygen Discharge (Final Oxygen Re-Evaluation):   Initial Exercise Prescription:  Initial Exercise Prescription - 08/23/20 1400       Date of Initial Exercise RX and Referring Provider   Date 08/23/20    Referring Provider Dr. Roxy Manns    Expected Discharge Date 11/17/20      Treadmill   MPH 1.8    Grade 0    Minutes 17      NuStep   Level 1    SPM 80    Minutes 22      Prescription Details   Frequency (times per week) 3    Duration Progress to 30 minutes of continuous aerobic without signs/symptoms of physical distress      Intensity   THRR 40-80% of Max Heartrate 60-121    Ratings of Perceived Exertion 11-13    Perceived Dyspnea 0-4      Resistance Training   Training Prescription Yes    Weight 4 lbs    Reps 10-15             Perform Capillary Blood Glucose checks as needed.  Exercise  Prescription Changes:   Exercise Prescription Changes     Row Name  09/04/20 1100 09/08/20 1100 09/18/20 1100 09/29/20 1100 10/13/20 1100     Response to Exercise   Blood Pressure (Admit) 110/60 -- 118/62 110/58 150/60   Blood Pressure (Exercise) 138/68 -- 112/62 150/60 140/70   Blood Pressure (Exit) 102/60 -- 108/62 110/55 115/65   Heart Rate (Admit) 67 bpm -- 67 bpm 65 bpm 65 bpm   Heart Rate (Exercise) 87 bpm -- 80 bpm 88 bpm 84 bpm   Heart Rate (Exit) 76 bpm -- 75 bpm 74 bpm 74 bpm   Rating of Perceived Exertion (Exercise) 11 -- 13 12 12    Duration Continue with 30 min of aerobic exercise without signs/symptoms of physical distress. -- Continue with 30 min of aerobic exercise without signs/symptoms of physical distress. Continue with 30 min of aerobic exercise without signs/symptoms of physical distress. Continue with 30 min of aerobic exercise without signs/symptoms of physical distress.   Intensity THRR unchanged -- THRR unchanged THRR unchanged THRR unchanged     Progression   Progression Continue to progress workloads to maintain intensity without signs/symptoms of physical distress. -- Continue to progress workloads to maintain intensity without signs/symptoms of physical distress. Continue to progress workloads to maintain intensity without signs/symptoms of physical distress. Continue to progress workloads to maintain intensity without signs/symptoms of physical distress.     Resistance Training   Training Prescription Yes -- Yes Yes Yes   Weight 3 lbs -- 4 lbs 4 4   Reps 10-15 -- 10-15 10-15 10-15   Time 10 Minutes -- 10 Minutes 10 Minutes 10 Minutes     Treadmill   MPH 1.8 -- 1.9 2.1 2.1   Grade 0 -- 0 0 0   Minutes 17 -- 17 17 17    METs 2.38 -- 2.45 2.61 2.61     NuStep   Level 1 -- 2 2 2    SPM 80 -- 77 95 101   Minutes 22 -- 22 22 22    METs 1.82 -- 1.98 2.34 2.74     Home Exercise Plan   Plans to continue exercise at -- Home (comment) -- -- --   Frequency -- Add 2 additional days to program exercise sessions. -- -- --   Initial  Home Exercises Provided -- 09/08/20 -- -- --    [place] Name 10/30/20 1300 11/13/20 1400           Response to Exercise   Blood Pressure (Admit) 106/60 96/60      Blood Pressure (Exercise) 112/60 110/60      Blood Pressure (Exit) 122/70 100/60      Heart Rate (Admit) 58 bpm 62 bpm      Heart Rate (Exercise) 77 bpm 83 bpm      Heart Rate (Exit) 77 bpm 71 bpm      Rating of Perceived Exertion (Exercise) 12 12      Duration Continue with 30 min of aerobic exercise without signs/symptoms of physical distress. Continue with 30 min of aerobic exercise without signs/symptoms of physical distress.      Intensity THRR unchanged THRR unchanged        Progression   Progression Continue to progress workloads to maintain intensity without signs/symptoms of physical distress. Continue to progress workloads to maintain intensity without signs/symptoms of physical distress.        Resistance Training   Training Prescription Yes Yes      Weight 4 lbs 5 lbs  Reps 10-15 10-15      Time 10 Minutes 10 Minutes        Treadmill   MPH 1.9 2.2      Grade 0 0      Minutes 17 17      METs 2.45 2.69        NuStep   Level 2 2      SPM 92 102      Minutes 22 22      METs 2.27 2.55               Exercise Comments:   Exercise Comments     Row Name 09/08/20 1131           Exercise Comments home exercise reviewed                Exercise Goals and Review:   Exercise Goals     Row Name 08/23/20 1429 09/19/20 0824 10/16/20 1410 11/13/20 1410       Exercise Goals   Increase Physical Activity Yes Yes Yes Yes    Intervention Provide advice, education, support and counseling about physical activity/exercise needs.;Develop an individualized exercise prescription for aerobic and resistive training based on initial evaluation findings, risk stratification, comorbidities and participant's personal goals. Provide advice, education, support and counseling about physical activity/exercise  needs.;Develop an individualized exercise prescription for aerobic and resistive training based on initial evaluation findings, risk stratification, comorbidities and participant's personal goals. Provide advice, education, support and counseling about physical activity/exercise needs.;Develop an individualized exercise prescription for aerobic and resistive training based on initial evaluation findings, risk stratification, comorbidities and participant's personal goals. Provide advice, education, support and counseling about physical activity/exercise needs.;Develop an individualized exercise prescription for aerobic and resistive training based on initial evaluation findings, risk stratification, comorbidities and participant's personal goals.    Expected Outcomes Short Term: Attend rehab on a regular basis to increase amount of physical activity.;Long Term: Add in home exercise to make exercise part of routine and to increase amount of physical activity.;Long Term: Exercising regularly at least 3-5 days a week. Short Term: Attend rehab on a regular basis to increase amount of physical activity.;Long Term: Add in home exercise to make exercise part of routine and to increase amount of physical activity.;Long Term: Exercising regularly at least 3-5 days a week. Short Term: Attend rehab on a regular basis to increase amount of physical activity.;Long Term: Add in home exercise to make exercise part of routine and to increase amount of physical activity.;Long Term: Exercising regularly at least 3-5 days a week. Short Term: Attend rehab on a regular basis to increase amount of physical activity.;Long Term: Add in home exercise to make exercise part of routine and to increase amount of physical activity.;Long Term: Exercising regularly at least 3-5 days a week.    Increase Strength and Stamina Yes Yes Yes Yes    Intervention Provide advice, education, support and counseling about physical activity/exercise  needs.;Develop an individualized exercise prescription for aerobic and resistive training based on initial evaluation findings, risk stratification, comorbidities and participant's personal goals. Provide advice, education, support and counseling about physical activity/exercise needs.;Develop an individualized exercise prescription for aerobic and resistive training based on initial evaluation findings, risk stratification, comorbidities and participant's personal goals. Provide advice, education, support and counseling about physical activity/exercise needs.;Develop an individualized exercise prescription for aerobic and resistive training based on initial evaluation findings, risk stratification, comorbidities and participant's personal goals. Provide advice, education, support and counseling about physical activity/exercise  needs.;Develop an individualized exercise prescription for aerobic and resistive training based on initial evaluation findings, risk stratification, comorbidities and participant's personal goals.    Expected Outcomes Short Term: Increase workloads from initial exercise prescription for resistance, speed, and METs.;Short Term: Perform resistance training exercises routinely during rehab and add in resistance training at home;Long Term: Improve cardiorespiratory fitness, muscular endurance and strength as measured by increased METs and functional capacity (6MWT) Short Term: Increase workloads from initial exercise prescription for resistance, speed, and METs.;Short Term: Perform resistance training exercises routinely during rehab and add in resistance training at home;Long Term: Improve cardiorespiratory fitness, muscular endurance and strength as measured by increased METs and functional capacity (6MWT) Short Term: Increase workloads from initial exercise prescription for resistance, speed, and METs.;Short Term: Perform resistance training exercises routinely during rehab and add in  resistance training at home;Long Term: Improve cardiorespiratory fitness, muscular endurance and strength as measured by increased METs and functional capacity (6MWT) Short Term: Increase workloads from initial exercise prescription for resistance, speed, and METs.;Short Term: Perform resistance training exercises routinely during rehab and add in resistance training at home;Long Term: Improve cardiorespiratory fitness, muscular endurance and strength as measured by increased METs and functional capacity (6MWT)    Able to understand and use rate of perceived exertion (RPE) scale Yes Yes Yes Yes    Intervention Provide education and explanation on how to use RPE scale Provide education and explanation on how to use RPE scale Provide education and explanation on how to use RPE scale Provide education and explanation on how to use RPE scale    Expected Outcomes Short Term: Able to use RPE daily in rehab to express subjective intensity level;Long Term:  Able to use RPE to guide intensity level when exercising independently Short Term: Able to use RPE daily in rehab to express subjective intensity level;Long Term:  Able to use RPE to guide intensity level when exercising independently Short Term: Able to use RPE daily in rehab to express subjective intensity level;Long Term:  Able to use RPE to guide intensity level when exercising independently Short Term: Able to use RPE daily in rehab to express subjective intensity level;Long Term:  Able to use RPE to guide intensity level when exercising independently    Knowledge and understanding of Target Heart Rate Range (THRR) Yes Yes Yes Yes    Intervention Provide education and explanation of THRR including how the numbers were predicted and where they are located for reference Provide education and explanation of THRR including how the numbers were predicted and where they are located for reference Provide education and explanation of THRR including how the numbers were  predicted and where they are located for reference Provide education and explanation of THRR including how the numbers were predicted and where they are located for reference    Expected Outcomes Short Term: Able to state/look up THRR;Long Term: Able to use THRR to govern intensity when exercising independently;Short Term: Able to use daily as guideline for intensity in rehab Short Term: Able to state/look up THRR;Long Term: Able to use THRR to govern intensity when exercising independently;Short Term: Able to use daily as guideline for intensity in rehab Short Term: Able to state/look up THRR;Long Term: Able to use THRR to govern intensity when exercising independently;Short Term: Able to use daily as guideline for intensity in rehab Short Term: Able to state/look up THRR;Long Term: Able to use THRR to govern intensity when exercising independently;Short Term: Able to use daily as guideline for intensity in  rehab    Able to check pulse independently Yes Yes Yes Yes    Intervention Provide education and demonstration on how to check pulse in carotid and radial arteries.;Review the importance of being able to check your own pulse for safety during independent exercise Provide education and demonstration on how to check pulse in carotid and radial arteries.;Review the importance of being able to check your own pulse for safety during independent exercise Provide education and demonstration on how to check pulse in carotid and radial arteries.;Review the importance of being able to check your own pulse for safety during independent exercise Provide education and demonstration on how to check pulse in carotid and radial arteries.;Review the importance of being able to check your own pulse for safety during independent exercise    Expected Outcomes Short Term: Able to explain why pulse checking is important during independent exercise;Long Term: Able to check pulse independently and accurately Short Term: Able to  explain why pulse checking is important during independent exercise;Long Term: Able to check pulse independently and accurately Short Term: Able to explain why pulse checking is important during independent exercise;Long Term: Able to check pulse independently and accurately Short Term: Able to explain why pulse checking is important during independent exercise;Long Term: Able to check pulse independently and accurately    Understanding of Exercise Prescription Yes Yes Yes Yes    Intervention Provide education, explanation, and written materials on patient's individual exercise prescription Provide education, explanation, and written materials on patient's individual exercise prescription Provide education, explanation, and written materials on patient's individual exercise prescription Provide education, explanation, and written materials on patient's individual exercise prescription    Expected Outcomes Short Term: Able to explain program exercise prescription;Long Term: Able to explain home exercise prescription to exercise independently Short Term: Able to explain program exercise prescription;Long Term: Able to explain home exercise prescription to exercise independently Short Term: Able to explain program exercise prescription;Long Term: Able to explain home exercise prescription to exercise independently Short Term: Able to explain program exercise prescription;Long Term: Able to explain home exercise prescription to exercise independently             Exercise Goals Re-Evaluation :  Exercise Goals Re-Evaluation     Row Name 09/19/20 0824 10/16/20 1410 11/13/20 1411         Exercise Goal Re-Evaluation   Exercise Goals Review Increase Physical Activity;Increase Strength and Stamina;Able to understand and use rate of perceived exertion (RPE) scale;Knowledge and understanding of Target Heart Rate Range (THRR);Able to check pulse independently;Understanding of Exercise Prescription Increase  Physical Activity;Increase Strength and Stamina;Able to understand and use rate of perceived exertion (RPE) scale;Knowledge and understanding of Target Heart Rate Range (THRR);Able to check pulse independently;Understanding of Exercise Prescription Increase Physical Activity;Increase Strength and Stamina;Able to understand and use rate of perceived exertion (RPE) scale;Knowledge and understanding of Target Heart Rate Range (THRR);Able to check pulse independently;Understanding of Exercise Prescription     Comments Pt has completed 9 sessions of cardiac rehab. He frequently arrives late and is not able to do the warm up. I believe that he could push himself harder as well while he is on the stepper. He is currently exercising at 1.98 METs on the stepper. Will continue to monitor and progress as able. Pt has completed 19 sessions of cardiac rehab. He has been arriving on time to complete warm up. He is progressing slowing and could push himself harder on the stepper. He is currently exercising at 2.75 METs on the  stepper. He currently has pneumonia and will take a little time for him to recover. Pt has completed 28 sessions. He has been able to progress past his levels before he had pneumonia. He has been pushing himself harder as of late. He is currently exercising at 2.69 METs. Will continue to monitor and progress as able.     Expected Outcomes Through exercise at home and at rehab, the patient will meet their stated goals. Through exercise at home and at rehab, the patient will meet their stated goals. Through exercise at home and at rehab, the patient will meet their stated goals.               Discharge Exercise Prescription (Final Exercise Prescription Changes):  Exercise Prescription Changes - 11/13/20 1400       Response to Exercise   Blood Pressure (Admit) 96/60    Blood Pressure (Exercise) 110/60    Blood Pressure (Exit) 100/60    Heart Rate (Admit) 62 bpm    Heart Rate (Exercise) 83 bpm     Heart Rate (Exit) 71 bpm    Rating of Perceived Exertion (Exercise) 12    Duration Continue with 30 min of aerobic exercise without signs/symptoms of physical distress.    Intensity THRR unchanged      Progression   Progression Continue to progress workloads to maintain intensity without signs/symptoms of physical distress.      Resistance Training   Training Prescription Yes    Weight 5 lbs    Reps 10-15    Time 10 Minutes      Treadmill   MPH 2.2    Grade 0    Minutes 17    METs 2.69      NuStep   Level 2    SPM 102    Minutes 22    METs 2.55             Nutrition:  Target Goals: Understanding of nutrition guidelines, daily intake of sodium 1500mg , cholesterol 200mg , calories 30% from fat and 7% or less from saturated fats, daily to have 5 or more servings of fruits and vegetables.  Biometrics:  Pre Biometrics - 08/23/20 1430       Pre Biometrics   Height 5\' 5"  (1.651 m)    Weight 74.1 kg    Waist Circumference 40 inches    Hip Circumference 40 inches    Waist to Hip Ratio 1 %    BMI (Calculated) 27.18    Triceps Skinfold 15 mm    % Body Fat 27.8 %    Grip Strength 31.6 kg    Flexibility 16 in    Single Leg Stand 14.22 seconds              Nutrition Therapy Plan and Nutrition Goals:  Nutrition Therapy & Goals - 08/23/20 1314       Personal Nutrition Goals   Comments Patietn scored 42 on his diet assessment. Handout provided and discussed with he and his wife regarding healthier choices. We offer 2 educational sessions on heart healthy nutrition with handouts and RD referral if patient is interested.      Intervention Plan   Intervention Nutrition handout(s) given to patient.             Nutrition Assessments:  Nutrition Assessments - 08/23/20 1314       MEDFICTS Scores   Pre Score 42            MEDIFICTS Score Key: ?70  Need to make dietary changes  40-70 Heart Healthy Diet ? 40 Therapeutic Level Cholesterol  Diet   Picture Your Plate Scores: <26 Unhealthy dietary pattern with much room for improvement. 41-50 Dietary pattern unlikely to meet recommendations for good health and room for improvement. 51-60 More healthful dietary pattern, with some room for improvement.  >60 Healthy dietary pattern, although there may be some specific behaviors that could be improved.    Nutrition Goals Re-Evaluation:   Nutrition Goals Discharge (Final Nutrition Goals Re-Evaluation):   Psychosocial: Target Goals: Acknowledge presence or absence of significant depression and/or stress, maximize coping skills, provide positive support system. Participant is able to verbalize types and ability to use techniques and skills needed for reducing stress and depression.  Initial Review & Psychosocial Screening:  Initial Psych Review & Screening - 08/23/20 1400       Initial Review   Current issues with Current Sleep Concerns      Family Dynamics   Good Support System? Yes      Barriers   Psychosocial barriers to participate in program Psychosocial barriers identified (see note)      Screening Interventions   Interventions Encouraged to exercise;To provide support and resources with identified psychosocial needs;Provide feedback about the scores to participant    Expected Outcomes Short Term goal: Identification and review with participant of any Quality of Life or Depression concerns found by scoring the questionnaire.             Quality of Life Scores:  Quality of Life - 08/23/20 1427       Quality of Life   Select Quality of Life      Quality of Life Scores   Health/Function Pre 23.9 %    Socioeconomic Pre 26.57 %    Psych/Spiritual Pre 24.86 %    Family Pre 28.8 %    GLOBAL Pre 25.37 %            Scores of 19 and below usually indicate a poorer quality of life in these areas.  A difference of  2-3 points is a clinically meaningful difference.  A difference of 2-3 points in the total  score of the Quality of Life Index has been associated with significant improvement in overall quality of life, self-image, physical symptoms, and general health in studies assessing change in quality of life.  PHQ-9: Recent Review Flowsheet Data     Depression screen [facility] 2/9 08/23/2020   Decreased Interest 0   Down, Depressed, Hopeless 0   PHQ - 2 Score 0   Altered sleeping 1   Tired, decreased energy 1   Change in appetite 0   Feeling bad or failure about yourself  0   Trouble concentrating 0   Moving slowly or fidgety/restless 0   Suicidal thoughts 0   PHQ-9 Score 2   Difficult doing work/chores Not difficult at all      Interpretation of Total Score  Total Score Depression Severity:  1-4 = Minimal depression, 5-9 = Mild depression, 10-14 = Moderate depression, 15-19 = Moderately severe depression, 20-27 = Severe depression   Psychosocial Evaluation and Intervention:  Psychosocial Evaluation - 08/23/20 1401       Psychosocial Evaluation & Interventions   Interventions Stress management education;Relaxation education;Encouraged to exercise with the program and follow exercise prescription    Comments Patient has no psychosocial barriers identified to participate in CR. His initial PHQ-9 score was 2 and his QOL score was 25.37 overall. He live with his  wife of many years. He has one daughter and 3 grandchildren who are all very supportative and involved in his life. 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 He denies any depression or anxiety or stress. He says he does have trouble falling asleep some nights but he does not take anything. He wants to improve his strength and stamina and SOB with activities and is looking forward to starting the program.    Expected Outcomes Patient will continue to have no psychosocial barriers identified.    Continue Psychosocial Services  No Follow up required             Psychosocial Re-Evaluation:  Psychosocial Re-Evaluation      [place] Name 09/13/20 0932 10/09/20 0947 11/06/20 1235         Psychosocial Re-Evaluation   Current issues with None Identified None Identified None Identified     Comments Patient is new to the program completing 6 sessions. He continues to have no psychosocial barriers identified. He seems to enjoy the program and is very interactive with staff and others in his class. We will continue to monitor. Patient has completed 16 sessions. He continues to have no psychosocial barriers identified. He continues to enjoy the program and is very interactive with staff and others in his class. He demonstrates an interest in improving his health. We will continue to monitor. Patient has completed 25 sessions. He continues to have no psychosocial barriers identified. He continues to enjoy the program and is very interactive with staff and others in his class. He demonstrates an interest in improving his health. We will continue to monitor.     Expected Outcomes Patient will continue to have no psychosocial barriers identified. Patient will continue to have no psychosocial barriers identified. Patient will continue to have no psychosocial barriers identified.     Interventions Encouraged to attend Cardiac Rehabilitation for the exercise;Stress management education;Relaxation education Encouraged to attend Cardiac Rehabilitation for the exercise;Stress management education;Relaxation education Encouraged to attend Cardiac Rehabilitation for the exercise;Stress management education;Relaxation education     Continue Psychosocial Services  No Follow up required No Follow up required No Follow up required              Psychosocial Discharge (Final Psychosocial Re-Evaluation):  Psychosocial Re-Evaluation - 11/06/20 1235       Psychosocial Re-Evaluation   Current issues with None Identified    Comments Patient has completed 25 sessions. He continues to have no psychosocial barriers identified. He continues to  enjoy the program and is very interactive with staff and others in his class. He demonstrates an interest in improving his health. We will continue to monitor.    Expected Outcomes Patient will continue to have no psychosocial barriers identified.    Interventions Encouraged to attend Cardiac Rehabilitation for the exercise;Stress management education;Relaxation education    Continue Psychosocial Services  No Follow up required             Vocational Rehabilitation: Provide vocational rehab assistance to qualifying candidates.   Vocational Rehab Evaluation & Intervention:  Vocational Rehab - 08/23/20 1315       Initial Vocational Rehab Evaluation & Intervention   Assessment shows need for Vocational Rehabilitation No      Vocational Rehab Re-Evaulation   Comments Remigio Eisenmenger is retired and does not need vocational rehab.             Education: Education Goals: Education classes will be provided on a weekly basis, covering required  topics. Participant will state understanding/return demonstration of topics presented.  Learning Barriers/Preferences:  Learning Barriers/Preferences - 08/23/20 1358       Learning Barriers/Preferences   Learning Barriers None    Learning Preferences Skilled Demonstration             Education Topics: Hypertension, Hypertension Reduction -Define heart disease and high blood pressure. Discus how high blood pressure affects the body and ways to reduce high blood pressure. Flowsheet Row CARDIAC REHAB PHASE II EXERCISE from 11/08/2020 in [place]  Date 10/11/20  Educator Andrew  Instruction Review Code 2- Demonstrated Understanding       Exercise and Your Heart -Discuss why it is important to exercise, the FITT principles of exercise, normal and abnormal responses to exercise, and how to exercise safely.   Angina -Discuss definition of angina, causes of angina, treatment of angina, and how to decrease risk of having  angina. Flowsheet Row CARDIAC REHAB PHASE II EXERCISE from 11/08/2020 in [place]  Date 10/25/20  Educator [place]  Instruction Review Code 2- Demonstrated Understanding       Cardiac Medications -Review what the following cardiac medications are used for, how they affect the body, and side effects that may occur when taking the medications.  Medications include Aspirin, Beta blockers, calcium channel blockers, ACE Inhibitors, angiotensin receptor blockers, diuretics, digoxin, and antihyperlipidemics. Flowsheet Row CARDIAC REHAB PHASE II EXERCISE from 11/08/2020 in [place]  Date 11/01/20  Educator DF  Instruction Review Code 2- Demonstrated Understanding       Congestive Heart Failure -Discuss the definition of CHF, how to live with CHF, the signs and symptoms of CHF, and how keep track of weight and sodium intake. Flowsheet Row CARDIAC REHAB PHASE II EXERCISE from 11/08/2020 in [place]  Date 11/08/20  Educator DJ  Instruction Review Code 1- Verbalizes Understanding       Heart Disease and Intimacy -Discus the effect sexual activity has on the heart, how changes occur during intimacy as we age, and safety during sexual activity.   Smoking Cessation / COPD -Discuss different methods to quit smoking, the health benefits of quitting smoking, and the definition of COPD.   Nutrition I: Fats -Discuss the types of cholesterol, what cholesterol does to the heart, and how cholesterol levels can be controlled. Flowsheet Row CARDIAC REHAB PHASE II EXERCISE from 11/08/2020 in [place]  Date 08/30/20  Educator DJ  Instruction Review Code 1- Verbalizes Understanding       Nutrition II: Labels -Discuss the different components of food labels and how to read food label [place] from 11/08/2020 in [place]  Date 09/13/20  Educator  DF  Instruction Review Code 2- Demonstrated Understanding       Heart Parts/Heart Disease and PAD -Discuss the anatomy of the heart, the pathway of blood circulation through the heart, and these are affected by heart disease.   Stress I: Signs and Symptoms -Discuss the causes of stress, how stress may lead to anxiety and depression, and ways to limit stress. Flowsheet Row CARDIAC REHAB PHASE II EXERCISE from 11/08/2020 in [place]  Date 09/27/20  Educator hj  Instruction Review Code 2- Demonstrated Understanding       Stress II: Relaxation -Discuss different types of relaxation techniques to limit stress. Flowsheet Row CARDIAC REHAB PHASE II EXERCISE from 11/08/2020 in [place]  Date 10/04/20  Educator  DF  Instruction Review Code 2- Demonstrated Understanding       Warning Signs of Stroke / TIA -Discuss definition of a stroke, what the signs and symptoms are of a stroke, and how to identify when someone is having stroke.   Knowledge Questionnaire Score:  Knowledge Questionnaire Score - 08/23/20 1358       Knowledge Questionnaire Score   Pre Score 20/24             Core Components/Risk Factors/Patient Goals at Admission:  Personal Goals and Risk Factors at Admission - 08/23/20 1358       Core Components/Risk Factors/Patient Goals on Admission    Weight Management Weight Maintenance    Improve shortness of breath with ADL's Yes    Intervention Provide education, individualized exercise plan and daily activity instruction to help decrease symptoms of SOB with activities of daily living.    Expected Outcomes Short Term: Improve cardiorespiratory fitness to achieve a reduction of symptoms when performing ADLs;Long Term: Be able to perform more ADLs without symptoms or delay the onset of symptoms    Personal Goal Other Yes    Personal Goal Increase strength and stamina. Improve SOB with activities.    Intervention  Patient will attend CR 3 days/week and supplement with exercise 2 days/week at home.    Expected Outcomes Patient will meeting both program and personal goals.             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 He has multiple risk factors for CAD and is participating in the program for risk modification. He has completed 6 sessions maintaining his weight since his initial visit. His blood pressure is well controlled. His personal goals for the program are to increase his strength and stamina and improve his SOB with activities. We will continue to monitor his progress as he works towards meeting these goals. Patient has completed 16 sessions gaining 2 lbs since last 30 day review. He is doing well in the program with consistent attendance and progression. His blood pressure is well controlled at rest and during exercise. He saw his cardiologist 9/29 for a routine f/u visit. No changes made. Lipid panel ordered but not completed yet. Patient's personal goals for the program are to increase his strength and stamina and improve his SOB with activities. We will continue to monitor his progress as he works towards meeting these goals. Patient has completed 25 sessions losing 1 lb since last 30 day review. He continue to do well in the program with consistent attendance and progression. His blood pressure is well controlled at rest and during exercise. He was hospitalized 10/14/20 with aspiration pneumonia with elevated troponin's and was diagnosed with a Type II NSTEMI. He was cleared to come back to CR 10/25/20. He has done well but is not  exercising at a level he was before his hospitalization. He does have some SOB with exertion but says he if feeling better.  Patient's personal goals for the program are to increase his strength and stamina and improve his SOB with activities. We will continue to monitor his progress as  he works towards meeting these goals.     Expected Outcomes Patient will complete the program meeting both personal and program goals. Patient will complete the program meeting both personal and program goals. Patient will complete the program meeting both personal and program goals.              Core Components/Risk Factors/Patient Goals at Discharge (Final Review):   Goals and Risk Factor Review - 11/06/20 1235       Core Components/Risk Factors/Patient Goals Review   Personal Goals Review Improve shortness of breath with ADL's;Other    Review Patient has completed 25 sessions losing 1 lb since last 30 day review. He continue to do well in the program with consistent attendance and progression. His blood pressure is well controlled at rest and during exercise. He was hospitalized 10/14/20 with aspiration pneumonia with elevated troponin's and was diagnosed with a Type II NSTEMI. He was cleared to come back to CR 10/25/20. He has done well but is not exercising at a level he was before his hospitalization. He does have some SOB with exertion but says he if feeling better.  Patient's personal goals for the program are to increase his strength and stamina and improve his SOB with activities. We will continue to monitor his progress as he works towards meeting these goals.    Expected Outcomes Patient will complete the program meeting both personal and program goals.             ITP Comments:   Comments: ITP REVIEW Pt is making expected progress toward Cardiac Rehab goals after completing 28 sessions. Recommend continued exercise, life style modification, education, and increased stamina and strength.

## 2020-11-15 NOTE — Progress Notes (Signed)
Daily Session Note  Patient Details  Name: James Wolf MRN: [id number] Date of Birth: [birth year] Referring Provider:   Flowsheet Row CARDIAC REHAB PHASE II ORIENTATION from 08/23/2020 in [place]  Referring Provider Dr. Roxy Manns       Encounter Date: 11/15/2020  Check In:  Session Check In - 11/15/20 1100       Check-In   Supervising physician immediately available to respond to emergencies [facility] MD immediately available    Physician(s) Dr. Radford Pax    Location [facility]    Staff Present Hoy Register, MS, ACSM-CEP, Exercise Physiologist;Heather Otho Ket, BS, Exercise Physiologist;Stefania Goulart Wynetta Emery, RN, BSN    Virtual Visit No    Medication changes reported     No    Fall or balance concerns reported    No    Tobacco Cessation No Change    Warm-up and Cool-down Performed as group-led instruction    Resistance Training Performed Yes    VAD Patient? No    PAD/SET Patient? No      Pain Assessment   Currently in Pain? No/denies    Pain Score 0-No pain    Multiple Pain Sites No             Capillary Blood Glucose: No results found for this or any previous visit (from the past 24 hour(s)).    Social History   Tobacco Use  Smoking Status Former   Packs/day: 1.00   Years: 25.00   Pack years: 25.00   Types: Cigarettes   Quit date: 02/19/1993   Years since quitting: 27.7  Smokeless Tobacco Never    Goals Met:  Independence with exercise equipment Exercise tolerated well No report of concerns or symptoms today Strength training completed today  Goals Unmet:  Not Applicable  Comments: Check out 1200.   Dr. Kathie Dike is Medical Director for [facility] Pulmonary Rehab.

## 2020-11-16 ENCOUNTER — Other Ambulatory Visit: Payer: Self-pay

## 2020-11-16 ENCOUNTER — Ambulatory Visit (HOSPITAL_COMMUNITY)
Admission: RE | Admit: 2020-11-16 | Discharge: 2020-11-16 | Disposition: A | Discharge status: Home / Self Care | Payer: Medicare Other | Source: Ambulatory Visit | Attending: Cardiology | Admitting: Cardiology

## 2020-11-16 DIAGNOSIS — I6523 Occlusion and stenosis of bilateral carotid arteries: Secondary | ICD-10-CM | POA: Insufficient documentation

## 2020-11-16 IMAGING — US US CAROTID DUPLEX BILAT
1 series · 13 of 24 positions shown · non-contrast
Comparison: None.

CLINICAL DATA: 70-year-old male with a history of carotid stenosis

EXAM:
BILATERAL CAROTID DUPLEX ULTRASOUND
TECHNIQUE: Gray scale imaging, color Doppler and duplex ultrasound were
performed of bilateral carotid and vertebral arteries in the neck.

[Series 1: us carotid duplex bilat · 0.05mm/px · 13 of 76 slices shown]
[im 1/76]
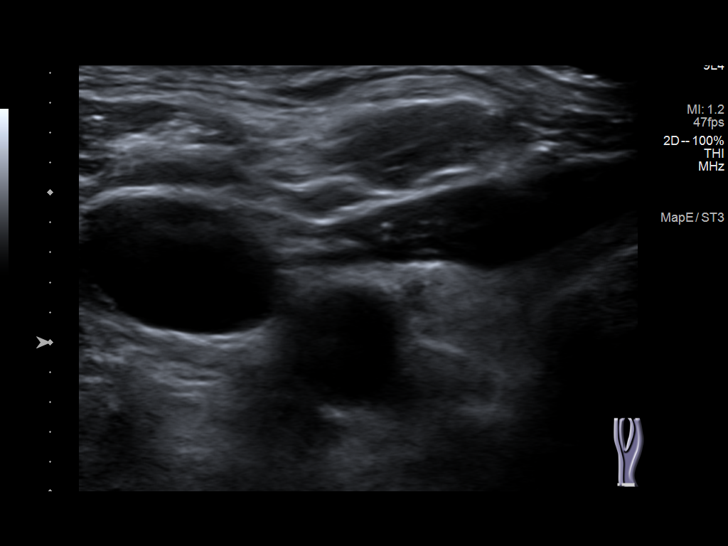
[im 7/76]
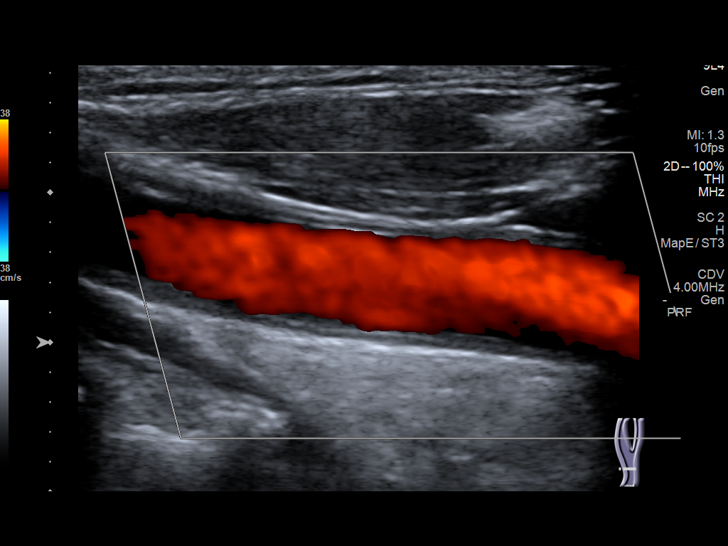
[im 14/76]
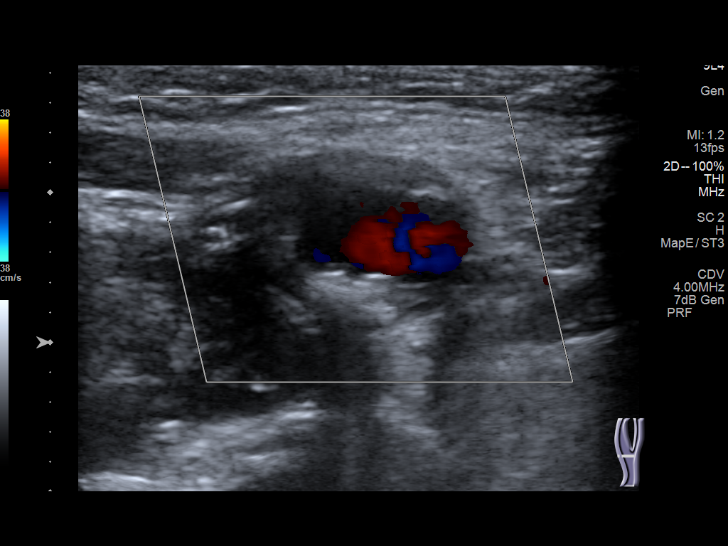
[im 20/76]
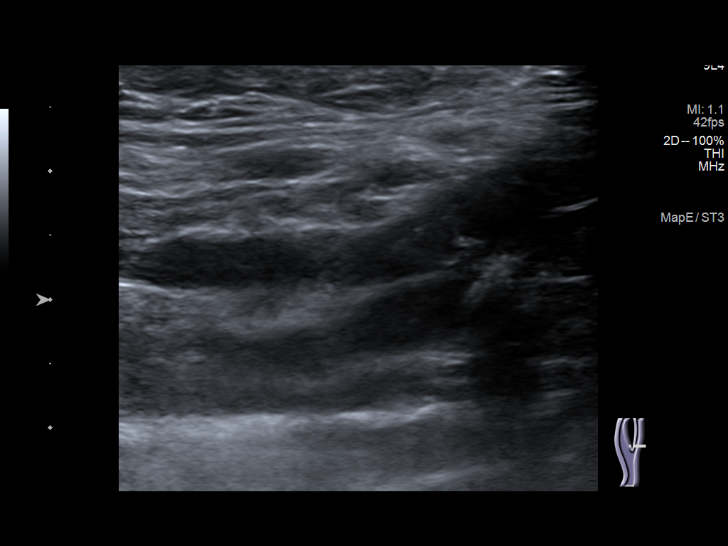
[im 27/76]
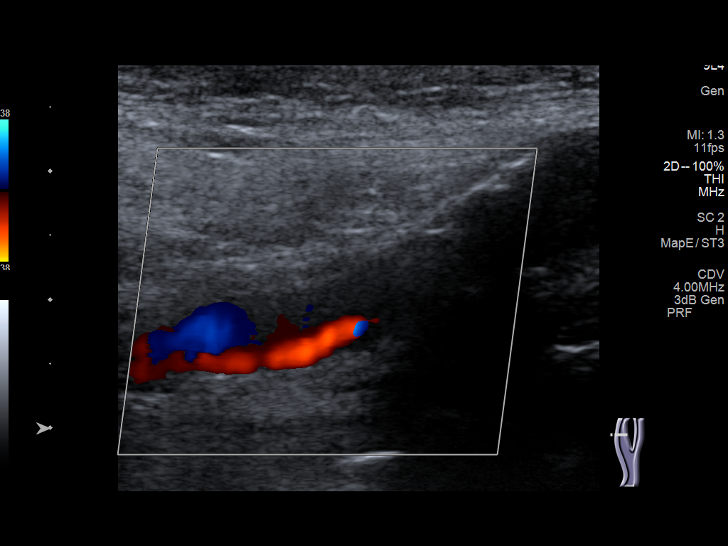
[im 33/76]
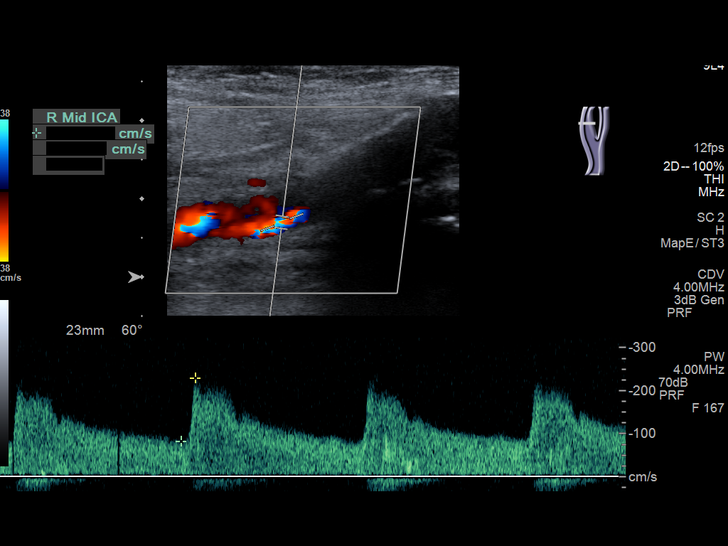
[im 40/76]
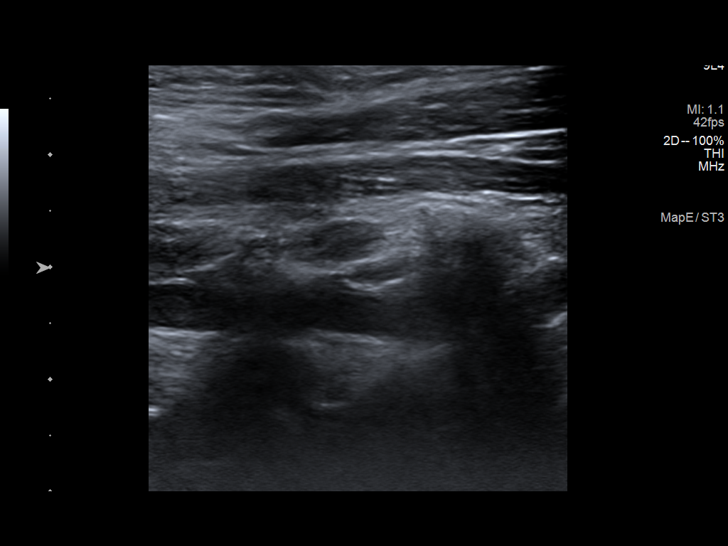
[im 43/76]
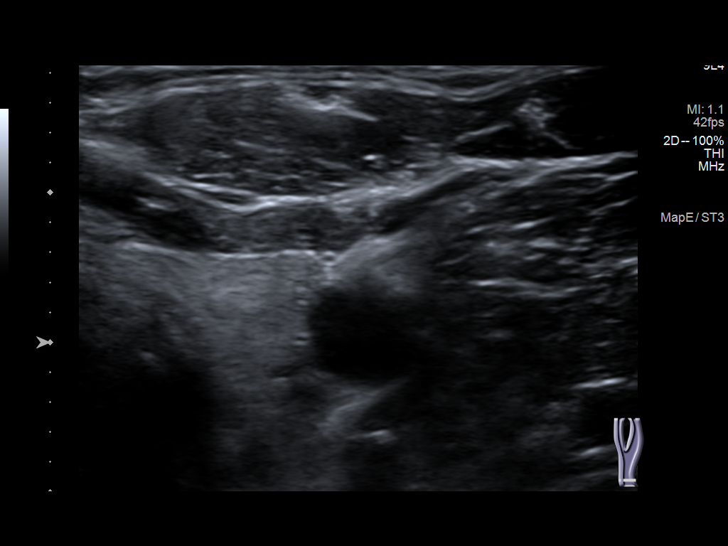
[im 49/76]
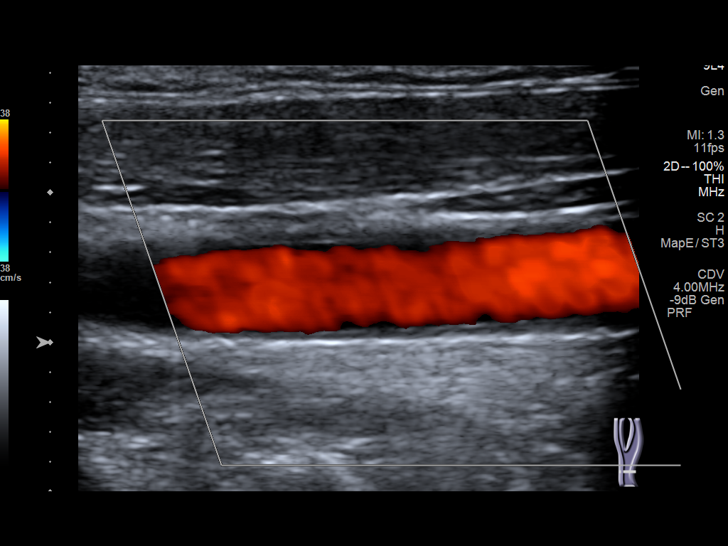
[im 56/76]
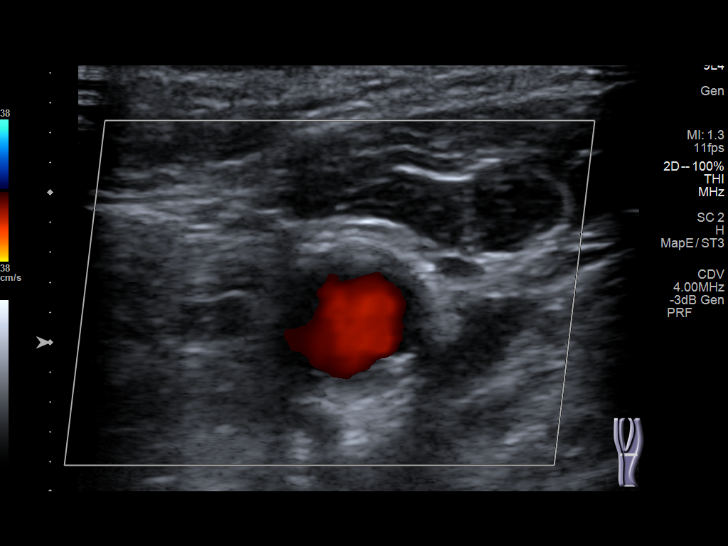
[im 62/76]
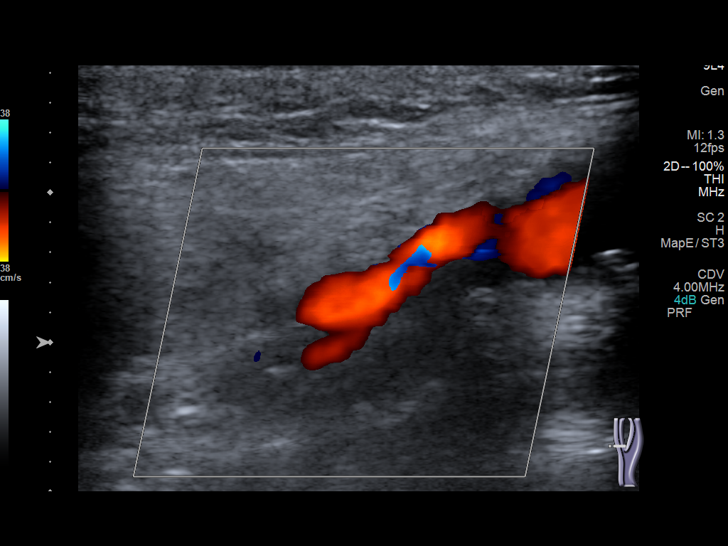
[im 69/76]
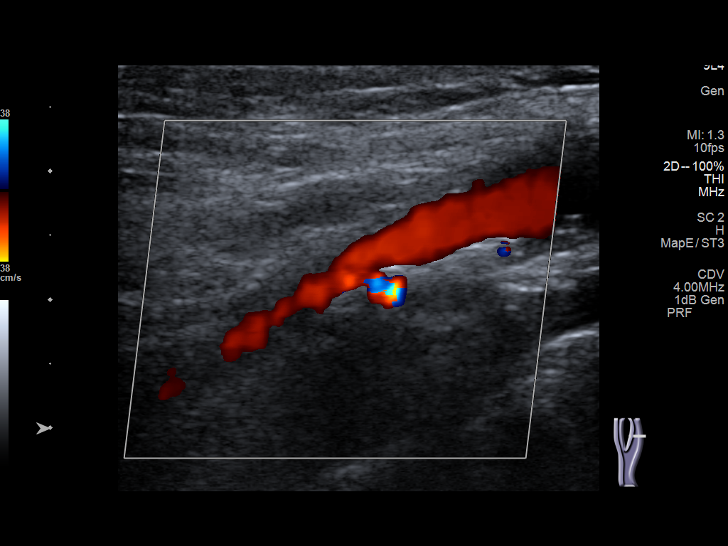
[im 76/76]
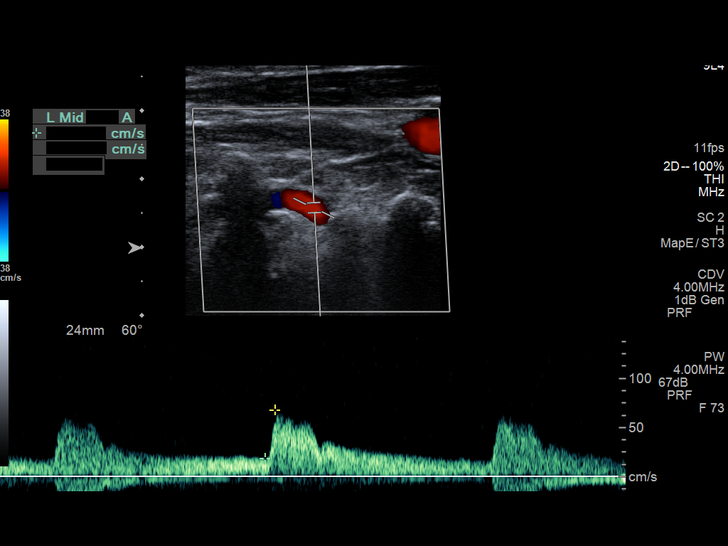

[13 of 24 positions shown; findings below may reference images not displayed]

FINDINGS: Criteria: Quantification of carotid stenosis is based on velocity
parameters that correlate the residual internal carotid diameter
with NASCET-based stenosis levels, using the diameter of the distal
internal carotid lumen as the denominator for stenosis measurement.

The following velocity measurements were obtained:

RIGHT

ICA:  Systolic 266 cm/sec, Diastolic 106 cm/sec

CCA:  67 cm/sec

SYSTOLIC ICA/CCA RATIO:

ECA:  116 cm/sec

LEFT

ICA:  Systolic 154 cm/sec, Diastolic 49 cm/sec

CCA:  79 cm/sec

SYSTOLIC ICA/CCA RATIO:

ECA:  141 cm/sec

Right Brachial SBP: Not acquired

Left Brachial SBP: Not acquired

RIGHT CAROTID ARTERY: No significant calcifications of the right
common carotid artery. Intermediate waveform maintained. Moderate
heterogeneous and partially calcified plaque at the right carotid
bifurcation. No significant lumen shadowing. Parvus tardus waveform
of the distal ICA. No significant tortuosity.

RIGHT VERTEBRAL ARTERY: Antegrade flow with low resistance waveform.

LEFT CAROTID ARTERY: No significant calcifications of the left
common carotid artery. Intermediate waveform maintained. Moderate
heterogeneous and partially calcified plaque at the left carotid
bifurcation. No significant lumen shadowing. Low resistance waveform
of the left ICA. No significant tortuosity.

LEFT VERTEBRAL ARTERY:  Antegrade flow with low resistance waveform.
IMPRESSION: Right:

Heterogeneous and partially calcified plaque of the right carotid
bifurcation contributes to 70%-99% stenosis by established duplex
criteria. Given the waveform, the stenosis is favored to be critical
stenosis.

Left:

Heterogeneous and partially calcified plaque of the left carotid
bifurcation contributes to 50%-69% stenosis by established duplex
criteria.

## 2020-11-17 ENCOUNTER — Encounter (HOSPITAL_COMMUNITY)
Admission: RE | Admit: 2020-11-17 | Discharge: 2020-11-17 | Disposition: A | Discharge status: Home / Self Care | Payer: Medicare Other | Source: Ambulatory Visit | Attending: Cardiology | Admitting: Cardiology

## 2020-11-17 DIAGNOSIS — Z951 Presence of aortocoronary bypass graft: Secondary | ICD-10-CM | POA: Diagnosis not present

## 2020-11-17 DIAGNOSIS — I214 Non-ST elevation (NSTEMI) myocardial infarction: Secondary | ICD-10-CM

## 2020-11-17 NOTE — Progress Notes (Signed)
Daily Session Note  Patient Details  Name: James Wolf MRN: [id number] Date of Birth: 13-Nov-1950 Referring Provider:   Flowsheet Row CARDIAC REHAB PHASE II ORIENTATION from 08/23/2020 in [place]  Referring Provider Dr. Roxy Manns       Encounter Date: 11/17/2020  Check In:  Session Check In - 11/17/20 1100       Check-In   Supervising physician immediately available to respond to emergencies [facility] MD immediately available    Physician(s) Dr. Harl Bowie    Location [facility]    Staff Present Hoy Register, MS, ACSM-CEP, Exercise Physiologist;Heather Zigmund Daniel, Exercise Physiologist;Neilan Rizzo Wynetta Emery, RN, BSN    Virtual Visit No    Medication changes reported     No    Fall or balance concerns reported    No    Tobacco Cessation No Change    Warm-up and Cool-down Performed as group-led instruction    Resistance Training Performed Yes    VAD Patient? No    PAD/SET Patient? No      Pain Assessment   Currently in Pain? No/denies    Pain Score 0-No pain    Multiple Pain Sites No             Capillary Blood Glucose: No results found for this or any previous visit (from the past 24 hour(s)).    Social History   Tobacco Use  Smoking Status Former   Packs/day: 1.00   Years: 25.00   Pack years: 25.00   Types: Cigarettes   Quit date: 02/19/1993   Years since quitting: 27.7  Smokeless Tobacco Never    Goals Met:  Independence with exercise equipment Exercise tolerated well No report of concerns or symptoms today Strength training completed today  Goals Unmet:  Not Applicable  Comments: Check 1200.   Dr. Kathie Dike is Medical Director for [facility] Pulmonary Rehab.

## 2020-11-20 ENCOUNTER — Ambulatory Visit (HOSPITAL_COMMUNITY): Payer: PRIVATE HEALTH INSURANCE

## 2020-11-20 ENCOUNTER — Encounter (HOSPITAL_COMMUNITY)
Admission: RE | Admit: 2020-11-20 | Discharge: 2020-11-20 | Disposition: A | Discharge status: Home / Self Care | Payer: Medicare Other | Source: Ambulatory Visit | Attending: Cardiology | Admitting: Cardiology

## 2020-11-20 DIAGNOSIS — Z951 Presence of aortocoronary bypass graft: Secondary | ICD-10-CM | POA: Diagnosis not present

## 2020-11-20 DIAGNOSIS — I214 Non-ST elevation (NSTEMI) myocardial infarction: Secondary | ICD-10-CM

## 2020-11-20 NOTE — Progress Notes (Signed)
Daily Session Note  Patient Details  Name: James Wolf MRN: [id number] Date of Birth: Apr 08, 1950 Referring Provider:   Flowsheet Row CARDIAC REHAB PHASE II ORIENTATION from 08/23/2020 in [place]  Referring Provider Dr. Roxy Manns       Encounter Date: 11/20/2020  Check In:  Session Check In - 11/20/20 1100       Check-In   Supervising physician immediately available to respond to emergencies [facility] MD immediately available    Physician(s) Dr. Domenic Polite    Location [facility]    Staff Present Hoy Register, MS, ACSM-CEP, Exercise Physiologist;Heather Otho Ket, BS, Exercise Physiologist;Debra Wynetta Emery, RN, BSN    Virtual Visit No    Medication changes reported     No    Fall or balance concerns reported    No    Tobacco Cessation No Change    Warm-up and Cool-down Performed as group-led instruction    Resistance Training Performed Yes    VAD Patient? No    PAD/SET Patient? No      Pain Assessment   Currently in Pain? No/denies    Pain Score 0-No pain    Multiple Pain Sites No             Capillary Blood Glucose: No results found for this or any previous visit (from the past 24 hour(s)).    Social History   Tobacco Use  Smoking Status Former   Packs/day: 1.00   Years: 25.00   Pack years: 25.00   Types: Cigarettes   Quit date: 02/19/1993   Years since quitting: 27.7  Smokeless Tobacco Never    Goals Met:  Independence with exercise equipment Exercise tolerated well No report of concerns or symptoms today Strength training completed today  Goals Unmet:  Not Applicable  Comments: checkout time is 1200   Dr. Kathie Dike is Medical Director for [facility] Pulmonary Rehab.

## 2020-11-22 ENCOUNTER — Telehealth: Payer: Self-pay

## 2020-11-22 ENCOUNTER — Encounter (HOSPITAL_COMMUNITY)
Admission: RE | Admit: 2020-11-22 | Discharge: 2020-11-22 | Disposition: A | Discharge status: Home / Self Care | Payer: Medicare Other | Source: Ambulatory Visit | Attending: Cardiology | Admitting: Cardiology

## 2020-11-22 ENCOUNTER — Ambulatory Visit (HOSPITAL_COMMUNITY): Payer: PRIVATE HEALTH INSURANCE

## 2020-11-22 DIAGNOSIS — I6523 Occlusion and stenosis of bilateral carotid arteries: Secondary | ICD-10-CM

## 2020-11-22 DIAGNOSIS — Z951 Presence of aortocoronary bypass graft: Secondary | ICD-10-CM | POA: Diagnosis not present

## 2020-11-22 DIAGNOSIS — I214 Non-ST elevation (NSTEMI) myocardial infarction: Secondary | ICD-10-CM

## 2020-11-22 NOTE — Progress Notes (Signed)
Daily Session Note  Patient Details  Name: James Wolf MRN: [id number] Date of Birth: 1950-07-24 Referring Provider:   Flowsheet Row CARDIAC REHAB PHASE II ORIENTATION from 08/23/2020 in [place]  Referring Provider Dr. Roxy Manns       Encounter Date: 11/22/2020  Check In:  Session Check In - 11/22/20 1056       Check-In   Supervising physician immediately available to respond to emergencies [facility] MD immediately available    Location [facility]    Staff Present Hoy Register, MS, ACSM-CEP, Exercise Physiologist;Heather Otho Ket, BS, Exercise Physiologist;Corin Tilly Wynetta Emery, RN, BSN    Virtual Visit No    Medication changes reported     No    Fall or balance concerns reported    No    Tobacco Cessation No Change    Warm-up and Cool-down Performed as group-led instruction    Resistance Training Performed Yes    VAD Patient? No    PAD/SET Patient? No      Pain Assessment   Currently in Pain? No/denies    Pain Score 0-No pain    Multiple Pain Sites No             Capillary Blood Glucose: No results found for this or any previous visit (from the past 24 hour(s)).    Social History   Tobacco Use  Smoking Status Former   Packs/day: 1.00   Years: 25.00   Pack years: 25.00   Types: Cigarettes   Quit date: 02/19/1993   Years since quitting: 27.7  Smokeless Tobacco Never    Goals Met:  Independence with exercise equipment Exercise tolerated well No report of concerns or symptoms today Strength training completed today  Goals Unmet:  Not Applicable  Comments: Check out 1200.   Dr. Kathie Dike is Medical Director for [facility] Pulmonary Rehab.

## 2020-11-22 NOTE — Telephone Encounter (Signed)
Pt notified and verbalized understanding. PCP copied.

## 2020-11-22 NOTE — Telephone Encounter (Signed)
-----   Message from Arnoldo Lenis, MD sent at 11/22/2020 10:06 AM EST ----- Carotid US shows significant blockage on the right side that is now in the severe range. Please refer to vascular surgery to evaluate   Zandra Abts MD

## 2020-11-24 ENCOUNTER — Encounter (HOSPITAL_COMMUNITY)
Admission: RE | Admit: 2020-11-24 | Discharge: 2020-11-24 | Disposition: A | Discharge status: Home / Self Care | Payer: Medicare Other | Source: Ambulatory Visit | Attending: Cardiology | Admitting: Cardiology

## 2020-11-24 ENCOUNTER — Ambulatory Visit (HOSPITAL_COMMUNITY): Payer: PRIVATE HEALTH INSURANCE

## 2020-11-24 DIAGNOSIS — Z951 Presence of aortocoronary bypass graft: Secondary | ICD-10-CM | POA: Diagnosis not present

## 2020-11-24 DIAGNOSIS — I214 Non-ST elevation (NSTEMI) myocardial infarction: Secondary | ICD-10-CM

## 2020-11-24 NOTE — Progress Notes (Signed)
Daily Session Note  Patient Details  Name: James Wolf MRN: [id number] Date of Birth: 08/02/1950 Referring Provider:   Flowsheet Row CARDIAC REHAB PHASE II ORIENTATION from 08/23/2020 in [place]  Referring Provider Dr. Roxy Manns       Encounter Date: 11/24/2020  Check In:  Session Check In - 11/24/20 1045       Check-In   Supervising physician immediately available to respond to emergencies [facility] MD immediately available    Physician(s) Dr. Domenic Polite    Location [facility]    Staff Present Hoy Register, MS, ACSM-CEP, Exercise Physiologist;Heather Otho Ket, BS, Exercise Physiologist;Debra Wynetta Emery, RN, BSN    Virtual Visit No    Medication changes reported     No    Fall or balance concerns reported    No    Tobacco Cessation No Change    Warm-up and Cool-down Performed as group-led instruction    Resistance Training Performed Yes    VAD Patient? No    PAD/SET Patient? No      Pain Assessment   Currently in Pain? No/denies    Pain Score 0-No pain    Multiple Pain Sites No             Capillary Blood Glucose: No results found for this or any previous visit (from the past 24 hour(s)).    Social History   Tobacco Use  Smoking Status Former   Packs/day: 1.00   Years: 25.00   Pack years: 25.00   Types: Cigarettes   Quit date: 02/19/1993   Years since quitting: 27.7  Smokeless Tobacco Never    Goals Met:  Independence with exercise equipment Exercise tolerated well No report of concerns or symptoms today Strength training completed today  Goals Unmet:  Not Applicable  Comments: checkout time is 1200   Dr. Kathie Dike is Medical Director for [facility].

## 2020-11-27 ENCOUNTER — Ambulatory Visit (HOSPITAL_COMMUNITY): Payer: PRIVATE HEALTH INSURANCE

## 2020-11-27 ENCOUNTER — Encounter (HOSPITAL_COMMUNITY)
Admission: RE | Admit: 2020-11-27 | Discharge: 2020-11-27 | Disposition: A | Discharge status: Home / Self Care | Payer: Medicare Other | Source: Ambulatory Visit | Attending: Cardiology | Admitting: Cardiology

## 2020-11-27 VITALS — Wt 165.1 lb

## 2020-11-27 DIAGNOSIS — Z951 Presence of aortocoronary bypass graft: Secondary | ICD-10-CM | POA: Diagnosis not present

## 2020-11-27 DIAGNOSIS — I214 Non-ST elevation (NSTEMI) myocardial infarction: Secondary | ICD-10-CM

## 2020-11-27 NOTE — Progress Notes (Signed)
Daily Session Note  Patient Details  Name: James Wolf MRN: [id number] Date of Birth: 06/03/1950 Referring Provider:   Flowsheet Row CARDIAC REHAB PHASE II ORIENTATION from 08/23/2020 in [place]  Referring Provider Dr. Roxy Manns       Encounter Date: 11/27/2020  Check In:  Session Check In - 11/27/20 1111       Check-In   Supervising physician immediately available to respond to emergencies [facility] MD immediately available    Physician(s) Dr. Harl Bowie    Location [facility]    Staff Present Hoy Register, MS, ACSM-CEP, Exercise Physiologist;Heather Zigmund Daniel, Exercise Physiologist;Debra Wynetta Emery, RN, BSN    Virtual Visit No    Medication changes reported     No    Fall or balance concerns reported    No    Tobacco Cessation No Change    Warm-up and Cool-down Performed as group-led instruction    Resistance Training Performed Yes    VAD Patient? No    PAD/SET Patient? No      Pain Assessment   Currently in Pain? No/denies    Pain Score 0-No pain    Multiple Pain Sites No             Capillary Blood Glucose: No results found for this or any previous visit (from the past 24 hour(s)).    Social History   Tobacco Use  Smoking Status Former   Packs/day: 1.00   Years: 25.00   Pack years: 25.00   Types: Cigarettes   Quit date: 02/19/1993   Years since quitting: 27.7  Smokeless Tobacco Never    Goals Met:  Independence with exercise equipment Exercise tolerated well No report of concerns or symptoms today Strength training completed today  Goals Unmet:  Not Applicable  Comments: checkout time is 1200   Dr. Kathie Dike is Medical Director for [facility].

## 2020-11-29 ENCOUNTER — Ambulatory Visit (HOSPITAL_COMMUNITY): Payer: PRIVATE HEALTH INSURANCE

## 2020-11-29 ENCOUNTER — Encounter (HOSPITAL_COMMUNITY)
Admission: RE | Admit: 2020-11-29 | Discharge: 2020-11-29 | Disposition: A | Discharge status: Home / Self Care | Payer: Medicare Other | Source: Ambulatory Visit | Attending: Cardiology | Admitting: Cardiology

## 2020-11-29 VITALS — Ht 65.0 in | Wt 166.0 lb

## 2020-11-29 DIAGNOSIS — I214 Non-ST elevation (NSTEMI) myocardial infarction: Secondary | ICD-10-CM

## 2020-11-29 DIAGNOSIS — Z951 Presence of aortocoronary bypass graft: Secondary | ICD-10-CM | POA: Diagnosis not present

## 2020-11-29 NOTE — Progress Notes (Signed)
Daily Session Note  Patient Details  Name: James Wolf MRN: [id number] Date of Birth: August 21, 1950 Referring Provider:   Flowsheet Row CARDIAC REHAB PHASE II ORIENTATION from 08/23/2020 in [place]  Referring Provider Dr. Roxy Manns       Encounter Date: 11/29/2020  Check In:  Session Check In - 11/29/20 1100       Check-In   Supervising physician immediately available to respond to emergencies [facility] MD immediately available    Physician(s) Dr. Harl Bowie    Location [facility]    Staff Present Hoy Register, MS, ACSM-CEP, Exercise Physiologist;Heather Zigmund Daniel, Exercise Physiologist;Debra Wynetta Emery, RN, BSN    Virtual Visit No    Medication changes reported     No    Fall or balance concerns reported    No    Tobacco Cessation No Change    Warm-up and Cool-down Performed as group-led instruction    Resistance Training Performed Yes    VAD Patient? No    PAD/SET Patient? No      Pain Assessment   Currently in Pain? No/denies    Pain Score 0-No pain    Multiple Pain Sites No             Capillary Blood Glucose: No results found for this or any previous visit (from the past 24 hour(s)).    Social History   Tobacco Use  Smoking Status Former   Packs/day: 1.00   Years: 25.00   Pack years: 25.00   Types: Cigarettes   Quit date: 02/19/1993   Years since quitting: 27.7  Smokeless Tobacco Never    Goals Met:  Independence with exercise equipment Exercise tolerated well No report of concerns or symptoms today Strength training completed today  Goals Unmet:  Not Applicable  Comments: checkout time is 1200   Dr. Kathie Dike is Medical Director for [facility].

## 2020-12-05 ENCOUNTER — Other Ambulatory Visit: Payer: Self-pay | Admitting: Family Medicine

## 2020-12-05 NOTE — Progress Notes (Signed)
Discharge Progress Report  Patient Details  Name: James Wolf MRN: [id number] Date of Birth: 03/31/1950 Referring Provider:   Flowsheet Row CARDIAC REHAB PHASE II ORIENTATION from 08/23/2020 in [facility]  Referring Provider Dr. Roxy Manns        Number of Visits: 35  Reason for Discharge:  Patient reached a stable level of exercise. Patient independent in their exercise. Patient has met program and personal goals.  Smoking History:  Social History   Tobacco Use  Smoking Status Former   Packs/day: 1.00   Years: 25.00   Pack years: 25.00   Types: Cigarettes   Quit date: 02/19/1993   Years since quitting: 27.8  Smokeless Tobacco Never    Diagnosis:  S/P CABG x 3  NSTEMI (non-ST elevated myocardial infarction) ([place])  ADL [facility]:   Initial Exercise Prescription:  Initial Exercise Prescription - 08/23/20 1400       Date of Initial Exercise RX and Referring Provider   Date 08/23/20    Referring Provider Dr. Roxy Manns    Expected Discharge Date 11/17/20      Treadmill   MPH 1.8    Grade 0    Minutes 17      NuStep   Level 1    SPM 80    Minutes 22      Prescription Details   Frequency (times per week) 3    Duration Progress to 30 minutes of continuous aerobic without signs/symptoms of physical distress      Intensity   THRR 40-80% of Max Heartrate 60-121    Ratings of Perceived Exertion 11-13    Perceived Dyspnea 0-4      Resistance Training   Training Prescription Yes    Weight 4 lbs    Reps 10-15             Discharge Exercise Prescription (Final Exercise Prescription Changes):  Exercise Prescription Changes - 11/27/20 1300       Response to Exercise   Blood Pressure (Admit) 105/60    Blood Pressure (Exercise) 122/68    Blood Pressure (Exit) 110/68    Heart Rate (Admit) 60 bpm    Heart Rate (Exercise) 91 bpm    Heart Rate (Exit) 69 bpm    Rating of Perceived Exertion (Exercise) 12    Duration Continue with 30 min of  aerobic exercise without signs/symptoms of physical distress.    Intensity THRR unchanged      Progression   Progression Continue to progress workloads to maintain intensity without signs/symptoms of physical distress.      Resistance Training   Training Prescription Yes    Weight 5 lbs    Reps 10-15    Time 10 Minutes      Treadmill   MPH 2.4    Grade 0    Minutes 17    METs 2.84      NuStep   Level 2    SPM 102    Minutes 22    METs 2.39             Functional Capacity:  6 Minute Walk     Row Name 08/23/20 1428 11/29/20 1132       6 Minute Walk   Phase -- Discharge    Distance 1150 feet 1400 feet    Distance Feet Change -- 250 ft    Walk Time 6 minutes 6 minutes    # of Rest Breaks 0 0    MPH 2.18 2.7  METS 2.31 2.96    RPE 11 13    VO2 Peak 8.09 10.38    Symptoms No No    Resting HR -- 64 bpm    Resting BP -- 106/70    Resting Oxygen Saturation  -- 96 %    Exercise Oxygen Saturation  during 6 min walk -- 99 %    Max Ex. HR -- 72 bpm    Max Ex. BP -- 160/70    2 Minute Post BP -- 115/70             Psychological, QOL, Others - Outcomes: PHQ 2/9: Depression screen [facility] 2/9 12/05/2020 08/23/2020  Decreased Interest 0 0  Down, Depressed, Hopeless 0 0  PHQ - 2 Score 0 0  Altered sleeping 1 1  Tired, decreased energy 1 1  Change in appetite 0 0  Feeling bad or failure about yourself  0 0  Trouble concentrating 0 0  Moving slowly or fidgety/restless 0 0  Suicidal thoughts 0 0  PHQ-9 Score 2 2  Difficult doing work/chores Not difficult at all Not difficult at all    Quality of Life:  Quality of Life - 11/29/20 1134       Quality of Life Scores   Health/Function Pre 23.9 %    Health/Function Post 25.87 %    Health/Function % Change 8.24 %    Socioeconomic Pre 26.57 %    Socioeconomic Post 28.93 %    Socioeconomic % Change  8.88 %    Psych/Spiritual Pre 24.86 %    Psych/Spiritual Post 27.5 %    Psych/Spiritual % Change 10.62 %     Family Pre 28.8 %    Family Post 28.8 %    Family % Change 0 %    GLOBAL Pre 25.37 %    GLOBAL Post 27.26 %    GLOBAL % Change 7.45 %             Personal Goals: Goals established at orientation with interventions provided to work toward goal.  Personal Goals and Risk Factors at Admission - 08/23/20 1358       Core Components/Risk Factors/Patient Goals on Admission    Weight Management Weight Maintenance    Improve shortness of breath with ADL's Yes    Intervention Provide education, individualized exercise plan and daily activity instruction to help decrease symptoms of SOB with activities of daily living.    Expected Outcomes Short Term: Improve cardiorespiratory fitness to achieve a reduction of symptoms when performing ADLs;Long Term: Be able to perform more ADLs without symptoms or delay the onset of symptoms    Personal Goal Other Yes    Personal Goal Increase strength and stamina. Improve SOB with activities.    Intervention Patient will attend CR 3 days/week and supplement with exercise 2 days/week at home.    Expected Outcomes Patient will meeting both program and personal goals.              Personal Goals Discharge:  Goals and Risk Factor Review     Row Name 09/13/20 0933 10/09/20 0949 11/06/20 1235 12/05/20 1334       Core Components/Risk Factors/Patient Goals Review   Personal Goals Review Improve shortness of breath with ADL's;Other Improve shortness of breath with ADL's;Other Improve shortness of breath with ADL's;Other Improve shortness of breath with ADL's;Other    Review Patient was referred to CR with S/P CABGx3. He has multiple risk factors for CAD and is participating in the program  for risk modification. He has completed 6 sessions maintaining his weight since his initial visit. His blood pressure is well controlled. His personal goals for the program are to increase his strength and stamina and improve his SOB with activities. We will continue to  monitor his progress as he works towards meeting these goals. Patient has completed 16 sessions gaining 2 lbs since last 30 day review. He is doing well in the program with consistent attendance and progression. His blood pressure is well controlled at rest and during exercise. He saw his cardiologist 9/29 for a routine f/u visit. No changes made. Lipid panel ordered but not completed yet. Patient's personal goals for the program are to increase his strength and stamina and improve his SOB with activities. We will continue to monitor his progress as he works towards meeting these goals. Patient has completed 25 sessions losing 1 lb since last 30 day review. He continue to do well in the program with consistent attendance and progression. His blood pressure is well controlled at rest and during exercise. He was hospitalized 10/14/20 with aspiration pneumonia with elevated troponin's and was diagnosed with a Type II NSTEMI. He was cleared to come back to CR 10/25/20. He has done well but is not exercising at a level he was before his hospitalization. He does have some SOB with exertion but says he if feeling better.  Patient's personal goals for the program are to increase his strength and stamina and improve his SOB with activities. We will continue to monitor his progress as he works towards meeting these goals. Pt graduated from rehab after 35 sessions. He gained 1.2 kg while in the program. All vitals were WNL. He missed several sessions with aspiration pneumonia, but otherwise had excellent attendance while in the program. He was able to improve his walk test distance by 21.7%. He was able to reach his goal of decreasing his SOB with exertion and he increased his strength and stamina.    Expected Outcomes Patient will complete the program meeting both personal and program goals. Patient will complete the program meeting both personal and program goals. Patient will complete the program meeting both personal and  program goals. Pt will continue to work towards their goals post discharge.             Exercise Goals and Review:  Exercise Goals     Row Name 08/23/20 1429 09/19/20 0824 10/16/20 1410 11/13/20 1410       Exercise Goals   Increase Physical Activity Yes Yes Yes Yes    Intervention Provide advice, education, support and counseling about physical activity/exercise needs.;Develop an individualized exercise prescription for aerobic and resistive training based on initial evaluation findings, risk stratification, comorbidities and participant's personal goals. Provide advice, education, support and counseling about physical activity/exercise needs.;Develop an individualized exercise prescription for aerobic and resistive training based on initial evaluation findings, risk stratification, comorbidities and participant's personal goals. Provide advice, education, support and counseling about physical activity/exercise needs.;Develop an individualized exercise prescription for aerobic and resistive training based on initial evaluation findings, risk stratification, comorbidities and participant's personal goals. Provide advice, education, support and counseling about physical activity/exercise needs.;Develop an individualized exercise prescription for aerobic and resistive training based on initial evaluation findings, risk stratification, comorbidities and participant's personal goals.    Expected Outcomes Short Term: Attend rehab on a regular basis to increase amount of physical activity.;Long Term: Add in home exercise to make exercise part of routine and to increase amount of physical  activity.;Long Term: Exercising regularly at least 3-5 days a week. Short Term: Attend rehab on a regular basis to increase amount of physical activity.;Long Term: Add in home exercise to make exercise part of routine and to increase amount of physical activity.;Long Term: Exercising regularly at least 3-5 days a week.  Short Term: Attend rehab on a regular basis to increase amount of physical activity.;Long Term: Add in home exercise to make exercise part of routine and to increase amount of physical activity.;Long Term: Exercising regularly at least 3-5 days a week. Short Term: Attend rehab on a regular basis to increase amount of physical activity.;Long Term: Add in home exercise to make exercise part of routine and to increase amount of physical activity.;Long Term: Exercising regularly at least 3-5 days a week.    Increase Strength and Stamina Yes Yes Yes Yes    Intervention Provide advice, education, support and counseling about physical activity/exercise needs.;Develop an individualized exercise prescription for aerobic and resistive training based on initial evaluation findings, risk stratification, comorbidities and participant's personal goals. Provide advice, education, support and counseling about physical activity/exercise needs.;Develop an individualized exercise prescription for aerobic and resistive training based on initial evaluation findings, risk stratification, comorbidities and participant's personal goals. Provide advice, education, support and counseling about physical activity/exercise needs.;Develop an individualized exercise prescription for aerobic and resistive training based on initial evaluation findings, risk stratification, comorbidities and participant's personal goals. Provide advice, education, support and counseling about physical activity/exercise needs.;Develop an individualized exercise prescription for aerobic and resistive training based on initial evaluation findings, risk stratification, comorbidities and participant's personal goals.    Expected Outcomes Short Term: Increase workloads from initial exercise prescription for resistance, speed, and METs.;Short Term: Perform resistance training exercises routinely during rehab and add in resistance training at home;Long Term: Improve  cardiorespiratory fitness, muscular endurance and strength as measured by increased METs and functional capacity (6MWT) Short Term: Increase workloads from initial exercise prescription for resistance, speed, and METs.;Short Term: Perform resistance training exercises routinely during rehab and add in resistance training at home;Long Term: Improve cardiorespiratory fitness, muscular endurance and strength as measured by increased METs and functional capacity (6MWT) Short Term: Increase workloads from initial exercise prescription for resistance, speed, and METs.;Short Term: Perform resistance training exercises routinely during rehab and add in resistance training at home;Long Term: Improve cardiorespiratory fitness, muscular endurance and strength as measured by increased METs and functional capacity (6MWT) Short Term: Increase workloads from initial exercise prescription for resistance, speed, and METs.;Short Term: Perform resistance training exercises routinely during rehab and add in resistance training at home;Long Term: Improve cardiorespiratory fitness, muscular endurance and strength as measured by increased METs and functional capacity (6MWT)    Able to understand and use rate of perceived exertion (RPE) scale Yes Yes Yes Yes    Intervention Provide education and explanation on how to use RPE scale Provide education and explanation on how to use RPE scale Provide education and explanation on how to use RPE scale Provide education and explanation on how to use RPE scale    Expected Outcomes Short Term: Able to use RPE daily in rehab to express subjective intensity level;Long Term:  Able to use RPE to guide intensity level when exercising independently Short Term: Able to use RPE daily in rehab to express subjective intensity level;Long Term:  Able to use RPE to guide intensity level when exercising independently Short Term: Able to use RPE daily in rehab to express subjective intensity level;Long Term:   Able to use  RPE to guide intensity level when exercising independently Short Term: Able to use RPE daily in rehab to express subjective intensity level;Long Term:  Able to use RPE to guide intensity level when exercising independently    Knowledge and understanding of Target Heart Rate Range (THRR) Yes Yes Yes Yes    Intervention Provide education and explanation of THRR including how the numbers were predicted and where they are located for reference Provide education and explanation of THRR including how the numbers were predicted and where they are located for reference Provide education and explanation of THRR including how the numbers were predicted and where they are located for reference Provide education and explanation of THRR including how the numbers were predicted and where they are located for reference    Expected Outcomes Short Term: Able to state/look up THRR;Long Term: Able to use THRR to govern intensity when exercising independently;Short Term: Able to use daily as guideline for intensity in rehab Short Term: Able to state/look up THRR;Long Term: Able to use THRR to govern intensity when exercising independently;Short Term: Able to use daily as guideline for intensity in rehab Short Term: Able to state/look up THRR;Long Term: Able to use THRR to govern intensity when exercising independently;Short Term: Able to use daily as guideline for intensity in rehab Short Term: Able to state/look up THRR;Long Term: Able to use THRR to govern intensity when exercising independently;Short Term: Able to use daily as guideline for intensity in rehab    Able to check pulse independently Yes Yes Yes Yes    Intervention Provide education and demonstration on how to check pulse in carotid and radial arteries.;Review the importance of being able to check your own pulse for safety during independent exercise Provide education and demonstration on how to check pulse in carotid and radial arteries.;Review the  importance of being able to check your own pulse for safety during independent exercise Provide education and demonstration on how to check pulse in carotid and radial arteries.;Review the importance of being able to check your own pulse for safety during independent exercise Provide education and demonstration on how to check pulse in carotid and radial arteries.;Review the importance of being able to check your own pulse for safety during independent exercise    Expected Outcomes Short Term: Able to explain why pulse checking is important during independent exercise;Long Term: Able to check pulse independently and accurately Short Term: Able to explain why pulse checking is important during independent exercise;Long Term: Able to check pulse independently and accurately Short Term: Able to explain why pulse checking is important during independent exercise;Long Term: Able to check pulse independently and accurately Short Term: Able to explain why pulse checking is important during independent exercise;Long Term: Able to check pulse independently and accurately    Understanding of Exercise Prescription Yes Yes Yes Yes    Intervention Provide education, explanation, and written materials on patient's individual exercise prescription Provide education, explanation, and written materials on patient's individual exercise prescription Provide education, explanation, and written materials on patient's individual exercise prescription Provide education, explanation, and written materials on patient's individual exercise prescription    Expected Outcomes Short Term: Able to explain program exercise prescription;Long Term: Able to explain home exercise prescription to exercise independently Short Term: Able to explain program exercise prescription;Long Term: Able to explain home exercise prescription to exercise independently Short Term: Able to explain program exercise prescription;Long Term: Able to explain home  exercise prescription to exercise independently Short Term: Able to explain program exercise  prescription;Long Term: Able to explain home exercise prescription to exercise independently             Exercise Goals Re-Evaluation:  Exercise Goals Re-Evaluation     Row Name 09/19/20 0824 10/16/20 1410 11/13/20 1411         Exercise Goal Re-Evaluation   Exercise Goals Review Increase Physical Activity;Increase Strength and Stamina;Able to understand and use rate of perceived exertion (RPE) scale;Knowledge and understanding of Target Heart Rate Range (THRR);Able to check pulse independently;Understanding of Exercise Prescription Increase Physical Activity;Increase Strength and Stamina;Able to understand and use rate of perceived exertion (RPE) scale;Knowledge and understanding of Target Heart Rate Range (THRR);Able to check pulse independently;Understanding of Exercise Prescription Increase Physical Activity;Increase Strength and Stamina;Able to understand and use rate of perceived exertion (RPE) scale;Knowledge and understanding of Target Heart Rate Range (THRR);Able to check pulse independently;Understanding of Exercise Prescription     Comments Pt has completed 9 sessions of cardiac rehab. He frequently arrives late and is not able to do the warm up. I believe that he could push himself harder as well while he is on the stepper. He is currently exercising at 1.98 METs on the stepper. Will continue to monitor and progress as able. Pt has completed 19 sessions of cardiac rehab. He has been arriving on time to complete warm up. He is progressing slowing and could push himself harder on the stepper. He is currently exercising at 2.75 METs on the stepper. He currently has pneumonia and will take a little time for him to recover. Pt has completed 28 sessions. He has been able to progress past his levels before he had pneumonia. He has been pushing himself harder as of late. He is currently exercising at 2.69  METs. Will continue to monitor and progress as able.     Expected Outcomes Through exercise at home and at rehab, the patient will meet their stated goals. Through exercise at home and at rehab, the patient will meet their stated goals. Through exercise at home and at rehab, the patient will meet their stated goals.              Nutrition & Weight - Outcomes:  Pre Biometrics - 08/23/20 1430       Pre Biometrics   Height _0  (1.651 m)    Weight 163 lb 5.8 oz (74.1 kg)    Waist Circumference 40 inches    Hip Circumference 40 inches    Waist to Hip Ratio 1 %    BMI (Calculated) 27.18    Triceps Skinfold 15 mm    % Body Fat 27.8 %    Grip Strength 31.6 kg    Flexibility 16 in    Single Leg Stand 14.22 seconds             Post Biometrics - 11/29/20 1133        Post  Biometrics   Height _1  (1.651 m)    Weight 166 lb 0.1 oz (75.3 kg)    Waist Circumference 40 inches    Hip Circumference 40 inches    Waist to Hip Ratio 1 %    BMI (Calculated) 27.62    Triceps Skinfold 15 mm    % Body Fat 27.9 %    Flexibility 8.5 in    Single Leg Stand 11 seconds             Nutrition:  Nutrition Therapy & Goals - 08/23/20 1314       Personal  Nutrition Goals   Comments Patietn scored 42 on his diet assessment. Handout provided and discussed with he and his wife regarding healthier choices. We offer 2 educational sessions on heart healthy nutrition with handouts and RD referral if patient is interested.      Intervention Plan   Intervention Nutrition handout(s) given to patient.             Nutrition Discharge:  Nutrition Assessments - 12/05/20 1332       MEDFICTS Scores   Pre Score 42    Post Score 58    Score Difference 16             Education Questionnaire Score:  Knowledge Questionnaire Score - 12/05/20 1330       Knowledge Questionnaire Score   Post Score 21/24             Goals reviewed with patient; copy given to patient. Pt graduated  from cardiac rehab after 35 sessions. He had great attendance while in the program. He was able to increase his walk test distance by 21.7% and his MET level at graduation was 2.84. He was able to reach his program goal of increasing his strength and stamina.

## 2020-12-06 ENCOUNTER — Ambulatory Visit (INDEPENDENT_AMBULATORY_CARE_PROVIDER_SITE_OTHER): Payer: Medicare Other | Admitting: Vascular Surgery

## 2020-12-06 ENCOUNTER — Encounter: Payer: Self-pay | Admitting: Vascular Surgery

## 2020-12-06 ENCOUNTER — Other Ambulatory Visit: Payer: Self-pay

## 2020-12-06 VITALS — BP 112/72 | HR 65 | Temp 98.4°F | Wt 164.4 lb

## 2020-12-06 DIAGNOSIS — I6521 Occlusion and stenosis of right carotid artery: Secondary | ICD-10-CM | POA: Diagnosis not present

## 2020-12-06 DIAGNOSIS — I6523 Occlusion and stenosis of bilateral carotid arteries: Secondary | ICD-10-CM | POA: Diagnosis not present

## 2020-12-06 NOTE — Progress Notes (Signed)
[facility] [place]  Patient name: James Wolf MRN: [id number] DOB: 10/08/1950 Sex: male  REASON FOR CONSULT: Evaluation severe right internal carotid artery stenosis, asymptomatic  HPI: James Wolf is a 70 y.o. male, who is here today for evaluation.  He is here today with his wife.  He had a remote ultrasound suggesting mild to moderate right carotid stenosis.  He underwent coronary artery bypass grafting in May 2022.  At that time his pre-CABG screen showed critical right carotid stenosis and no significant stenosis on the left carotid system.  He did well with his coronary bypass.  He recently underwent repeat duplex again showing severe right internal carotid artery stenosis.  He is here today for further discussion.  He specifically denies any prior history of amaurosis fugax, transient ischemic attack, aphasia or stroke.  He reports that his father had a major left brain stroke causing right side paralysis.  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  Did not have an anastomosis in his neck.  Has not had neck surgery.  He is right-handed.  Past Medical History:  Diagnosis Date   Anemia    Aspiration pneumonia (HCC)    CAD (coronary artery disease)    a. s/p CABG 05/2020.   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   LIMA to LAD, SVG to OM, SVG to RCA    Family History  Problem Relation Age of Onset   Heart disease Mother    CAD Father    CVA Father     SOCIAL HISTORY: Social History   Socioeconomic History   Marital status:  Married    Spouse name: Not on file   Number of children: Not on file   Years of education: Not on file   Highest education level: Not on file  Occupational History   Not on file  Tobacco Use   Smoking status: Former    Packs/day: 1.00    Years: 25.00    Pack years: 25.00    Types: Cigarettes    Quit date: 02/19/1993    Years since quitting: 27.8   Smokeless tobacco: Never  Vaping Use   Vaping Use: Never used  Substance and Sexual Activity   Alcohol use: Not Currently   Drug use: Never   Sexual activity: Yes  Other Topics Concern   Not on file  Social History Narrative   Not on file   Social Determinants of Health   Financial Resource Strain: Not on file  Food Insecurity: Not on file  Transportation Needs: Not on file  Physical Activity: Not on file  Stress: Not on file  Social Connections: Not on file  Intimate Partner Violence: Not on file    Allergies  Allergen Reactions   Zithromax [Azithromycin] Diarrhea    Causes pt's stomach to "tear up" does not want to take medication again.     Current Outpatient Medications  Medication Sig Dispense Refill   albuterol (VENTOLIN HFA) 108 (  90 Base) MCG/ACT inhaler Inhale 1-2 puffs into the lungs every 6 (six) hours as needed for wheezing or shortness of breath.     aspirin EC 81 MG EC tablet Take 1 tablet (81 mg total) by mouth daily. Swallow whole. 30 tablet 11   B Complex Vitamins (VITAMIN B COMPLEX PO) Take 1 tablet by mouth daily.      cholecalciferol (VITAMIN D3) 25 MCG (1000 UNIT) tablet Take 1,000 Units by mouth daily.     clopidogrel (PLAVIX) 75 MG tablet Take 1 tablet (75 mg total) by mouth daily. 30 tablet 11   diclofenac sodium (VOLTAREN) 1 % GEL Apply 2 g topically 2 (two) times daily as needed (pain).      esomeprazole (NEXIUM) 40 MG capsule Take 40 mg by mouth 2 (two) times daily.      ezetimibe (ZETIA) 10 MG tablet TAKE 1 TABLET BY MOUTH EVERY DAY 90 tablet 1   metoprolol tartrate (LOPRESSOR) 100 MG tablet  Take 1 tablet (100 mg total) by mouth 2 (two) times daily. (Patient taking differently: Take 50 mg by mouth 2 (two) times daily.) 60 tablet 3   rosuvastatin (CRESTOR) 40 MG tablet Take 1 tablet (40 mg total) by mouth daily at 6 PM. 30 tablet 3   tamsulosin (FLOMAX) 0.4 MG CAPS capsule Take 0.4 mg by mouth daily.     amoxicillin-clavulanate (AUGMENTIN) 875-125 MG tablet Take 1 tablet by mouth 2 (two) times daily. 8 tablet 0   doxycycline (VIBRA-TABS) 100 MG tablet Take 1 tablet (100 mg total) by mouth 2 (two) times daily. 8 tablet 0   No current facility-administered medications for this visit.    REVIEW OF SYSTEMS:  [X]  denotes positive finding, [ ]  denotes negative finding Cardiac  Comments:  Chest pain or chest pressure:    Shortness of breath upon exertion:    Short of breath when lying flat:    Irregular heart rhythm:        Vascular    Pain in calf, thigh, or hip brought on by ambulation:    Pain in feet at night that wakes you up from your sleep:     Blood clot in your veins:    Leg swelling:         Pulmonary    Oxygen at home:    Productive cough:     Wheezing:         Neurologic    Sudden weakness in arms or legs:     Sudden numbness in arms or legs:     Sudden onset of difficulty speaking or slurred speech:    Temporary loss of vision in one eye:     Problems with dizziness:         Gastrointestinal    Blood in stool:     Vomited blood:         Genitourinary    Burning when urinating:     Blood in urine:        Psychiatric    Major depression:         Hematologic    Bleeding problems:    Problems with blood clotting too easily:        Skin    Rashes or ulcers:        Constitutional    Fever or chills:      PHYSICAL EXAM: Vitals:   12/06/20 0940  BP: 112/72  Pulse: 65  Temp: 98.4 F (36.9 C)  TempSrc: Skin  SpO2: 96%  Weight: 164 lb 6.4 oz (74.6 kg)    GENERAL: The patient is a well-nourished male, in no acute distress. The vital signs are  documented above. CARDIOVASCULAR: I do not appreciate carotid bruits bilaterally.  2+ radial and 2+ dorsalis pedis pulses bilaterally PULMONARY: There is good air exchange  MUSCULOSKELETAL: There are no major deformities or cyanosis. NEUROLOGIC: No focal weakness or paresthesias are detected. SKIN: There are no ulcers or rashes noted. PSYCHIATRIC: The patient has a normal affect.  DATA:  Carotid duplex from 11/16/2020 and also from May 2022 were reviewed.  Both of these show high-grade right internal carotid artery stenosis.  Does appear that from his May study that the plaque does extend into the internal carotid above the bifurcation.  No significant left carotid stenosis  MEDICAL ISSUES: Had a long discussion with the patient and his wife regarding the significance of his severe asymptomatic disease.  I have recommended CT angiogram for further evaluation to assure that he does not extend far up into his internal carotid artery.  I explained that in all likelihood we will recommend endarterectomy for reduction of stroke risk.  I did discuss carotid endarterectomy details with the patient and his wife.  Discussed the potential perioperative complications to include a 1 to 1-1/2% risk of stroke and a slight risk of cranial nerve injury resulting in swallowing difficulty or hoarseness and bleeding and infection.  We will obtain outpatient CT angiogram of the neck at [facility] and I will discuss this with him by telephone following this.  In all likelihood we will plan elective right carotid endarterectomy to follow.  He understands this would be done at [facility] with one of my partners.   Rosetta Posner, MD FACS [facility] [phone] Pager [phone]  Note: Portions of this report may have been transcribed using voice recognition software.  Every effort has been made to ensure accuracy; however, inadvertent computerized  transcription errors may still be present.

## 2020-12-07 ENCOUNTER — Other Ambulatory Visit: Payer: Self-pay

## 2020-12-07 DIAGNOSIS — I6521 Occlusion and stenosis of right carotid artery: Secondary | ICD-10-CM

## 2020-12-08 ENCOUNTER — Other Ambulatory Visit: Payer: Self-pay

## 2020-12-08 ENCOUNTER — Ambulatory Visit (HOSPITAL_COMMUNITY)
Admission: RE | Admit: 2020-12-08 | Discharge: 2020-12-08 | Disposition: A | Discharge status: Home / Self Care | Payer: Medicare Other | Source: Ambulatory Visit | Attending: Vascular Surgery | Admitting: Vascular Surgery

## 2020-12-08 DIAGNOSIS — I6521 Occlusion and stenosis of right carotid artery: Secondary | ICD-10-CM | POA: Insufficient documentation

## 2020-12-08 LAB — POCT I-STAT CREATININE: Creatinine, Ser: 1.2 mg/dL (ref 0.61–1.24)

## 2020-12-08 IMAGING — CT CT ANGIO NECK
2 of 7 series · 8 of 33 positions shown · IV contrast (omnipaque)
Comparison: [DATE] carotid Doppler

CLINICAL DATA: Carotid stenosis screening. Follow-up abnormal
carotid ultrasound

EXAM:
CT ANGIOGRAPHY NECK
TECHNIQUE: Multidetector CT imaging of the neck was performed using the
standard protocol during bolus administration of intravenous
contrast. Multiplanar CT image reconstructions and MIPs were
obtained to evaluate the vascular anatomy. Carotid stenosis
measurements (when applicable) are obtained utilizing NASCET
criteria, using the distal internal carotid diameter as the
denominator.
CONTRAST:  75mL OMNIPAQUE IOHEXOL 350 MG/ML SOLN

[Series 5: cta neck · axial · 0.54mm/px · z∈[-144,-60]mm · 2 of 126 slices shown]
[im 42/126  soft-tissue]
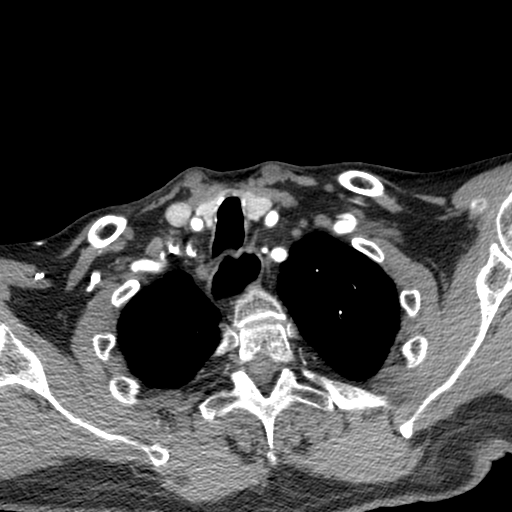
[im 84/126  soft-tissue]
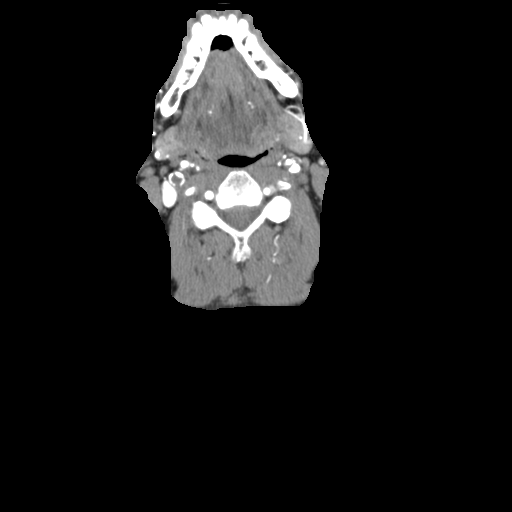

[Series 6: ax thin · axial · 0.48mm/px · z∈[-189,-9]mm · 6 of 253 slices shown]
[im 37/253  soft-tissue]
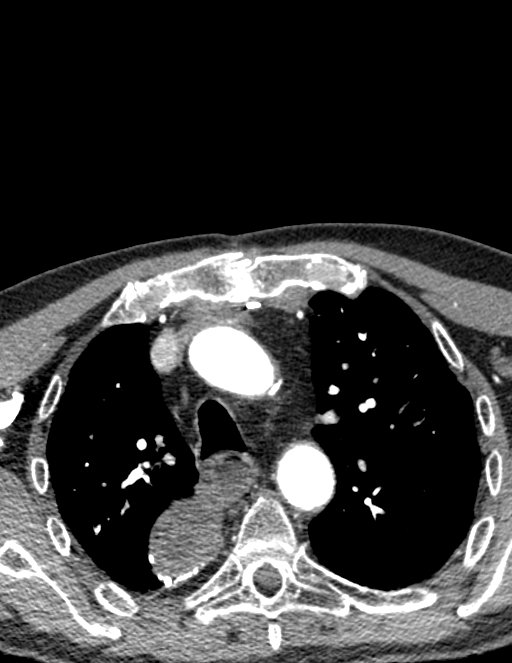
[im 73/253  bone]
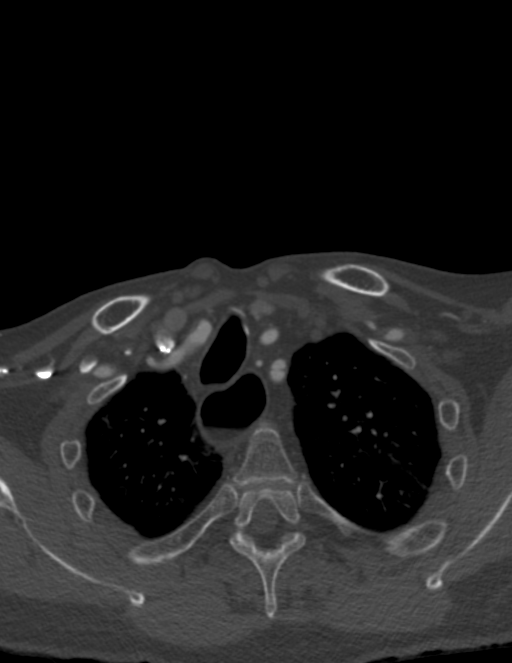
[im 109/253  soft-tissue]
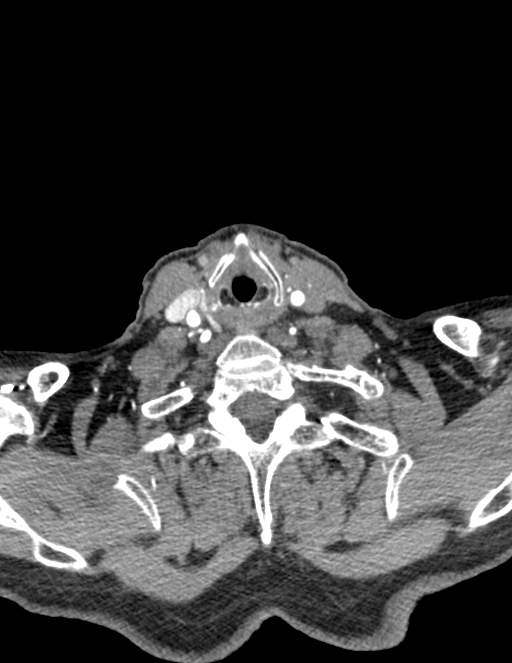
[im 145/253  bone]
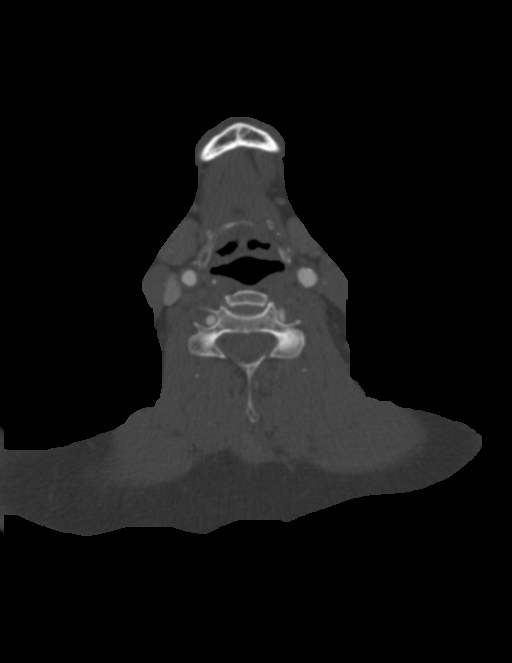
[im 181/253  soft-tissue]
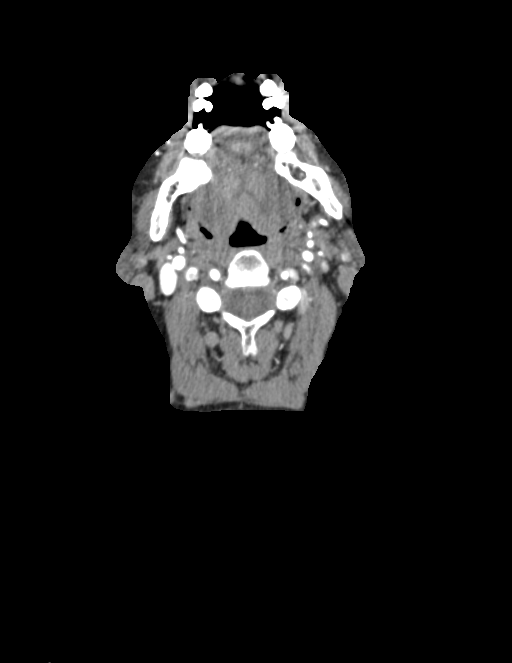
[im 217/253  bone]
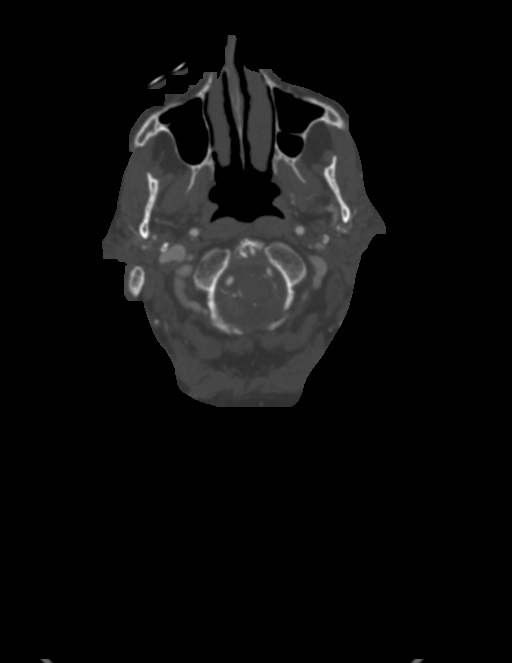

[8 of 33 positions shown; findings below may reference images not displayed]

FINDINGS: Aortic arch: Atheromatous plaque with luminal irregularity.
Partially covered CABG including [REDACTED]. No visible enhancement of
saphenous graft at the upper ostial marker.

Right carotid system: Atheromatous plaque primarily at the
bifurcation and primarily low-density with 70% luminal stenosis.
Plaque is fairly long, 17 mm in length on sagittal reformats. No
ulceration or dissection.

Left carotid system: Atheromatous plaque at the left bifurcation
which is primarily calcified. Associated stenosis measures up to
60%.

Vertebral arteries: Atheromatous plaque affecting the proximal
subclavians with irregularity on the left where there is a short
segment web like appearance which is nonocclusive. Right dominant
vertebral artery with atheromatous plaque at the V1 and V4 segments.
The V1 stenosis on the right measures up to 50%. Moderate narrowing
of the left vertebral artery at the dural penetration.

Skeleton: Cervical spine degeneration which is ordinary.

Other neck: No acute finding

Upper chest: Changes of gastric pull-through.
IMPRESSION: 1. 70% atheromatous narrowing at the proximal right ICA due to
low-density plaque.
2. Up to 60% stenosis at the proximal left ICA due to calcified
plaque.
3. 50% narrowing at the right V1 segment. Moderate left vertebral
narrowing at the dural penetration
4. CABG.  No visible enhancement of the uppermost venous graft.

## 2020-12-08 MED ORDER — IOHEXOL 350 MG/ML SOLN
75.0000 mL | Freq: Once | INTRAVENOUS | Status: AC | PRN
  Administered 2020-12-08: 75 mL via INTRAVENOUS

## 2020-12-14 ENCOUNTER — Other Ambulatory Visit: Payer: Self-pay

## 2020-12-14 ENCOUNTER — Ambulatory Visit (INDEPENDENT_AMBULATORY_CARE_PROVIDER_SITE_OTHER): Payer: Medicare Other | Admitting: Orthopedic Surgery

## 2020-12-14 ENCOUNTER — Encounter: Payer: Self-pay | Admitting: Orthopedic Surgery

## 2020-12-14 DIAGNOSIS — M7522 Bicipital tendinitis, left shoulder: Secondary | ICD-10-CM | POA: Diagnosis not present

## 2020-12-14 DIAGNOSIS — I6523 Occlusion and stenosis of bilateral carotid arteries: Secondary | ICD-10-CM | POA: Diagnosis not present

## 2020-12-14 DIAGNOSIS — Z9889 Other specified postprocedural states: Secondary | ICD-10-CM

## 2020-12-14 NOTE — Progress Notes (Signed)
EVALUATION AND MANAGEMENT   Type of appointment : Established patient problem with left shoulder previously seen had surgery October 26, 2019 left rotator cuff repair open technique  Chief Complaint  Patient presents with   Shoulder Pain    Left has pain front of shoulder into chest / clavicle area     70 year old male status post left rotator cuff repair was in the hospital for something he thinks he may have moved his arm wrong but in any event he started having pain in the front of his shoulder and into his chest and with certain arm movements  He reports no additional trauma   Review of Systems  Constitutional:  Negative for fever.  Neurological:  Positive for tingling and sensory change. Negative for focal weakness.    There is no height or weight on file to calculate BMI.  Physical Exam Constitutional:      General: He is not in acute distress.    Appearance: He is well-developed.     Comments: Well developed, well nourished Normal grooming and hygiene     Cardiovascular:     Comments: No peripheral edema Musculoskeletal:     Comments: Left shoulder there is a scar in the front of the shoulder he is tender over the biceps tendon especially with the hand in supination  He has good resistance to abduction and flexion he has active range of motion of 125 degrees    Skin:    General: Skin is warm and dry.  Neurological:     Mental Status: He is alert and oriented to person, place, and time.     Sensory: No sensory deficit.     Coordination: Coordination normal.     Gait: Gait normal.     Deep Tendon Reflexes: Reflexes are normal and symmetric.  Psychiatric:        Mood and Affect: Mood normal.        Behavior: Behavior normal.        Thought Content: Thought content normal.        Judgment: Judgment normal.     Comments: Affect normal     Past Medical History:  Diagnosis Date   Anemia    Aspiration pneumonia (HCC)    CAD (coronary artery disease)    a. s/p  CABG 05/2020.   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   LIMA to LAD, SVG to OM, SVG to RCA   Past Surgical History:  Procedure Laterality Date   CORONARY ARTERY BYPASS GRAFT N/A 06/02/2020   Procedure: CORONARY ARTERY BYPASS GRAFTING (CABG) TIMES THREE USING RIGHT GREATER SAPHENOUS VEIN HARVESTED ENDOSCOPICALLY AND LEFT INTERNAL MAMMARY ARTERY.;  Surgeon: Rexene Alberts, MD;  Location: [place];  Service: Open Heart Surgery;  Laterality: N/A;   CYSTOSCOPY/RETROGRADE/URETEROSCOPY/STONE EXTRACTION WITH BASKET     ESOPHAGECTOMY     Ivor-Lewis esophagectomy 1995 [facility]   IR THORACENTESIS ASP PLEURAL SPACE W/IMG GUIDE  06/06/2020   LEFT HEART CATH AND CORONARY ANGIOGRAPHY N/A 05/30/2020   Procedure: LEFT HEART CATH AND  CORONARY ANGIOGRAPHY;  Surgeon: Martinique, Peter M, MD;  Location: [place] [facility];  Service: Cardiovascular;  Laterality: N/A;   partial esophageal ejectory     SHOULDER OPEN ROTATOR CUFF REPAIR Left 10/26/2019   Procedure: ROTATOR CUFF REPAIR SHOULDER OPEN LEFT;  Surgeon: [name], MD;  Location: [facility];  Service: Orthopedics;  Laterality: Left;   Social History   Tobacco Use   Smoking status: Former    Packs/day: 1.00    Years: 25.00    Pack years: 25.00    Types: Cigarettes    Quit date: 02/19/1993    Years since quitting: 27.8   Smokeless tobacco: Never  Vaping Use   Vaping Use: Never used  Substance Use Topics   Alcohol use: Not Currently   Drug use: Never     Assessment and Plan:  Encounter Diagnoses  Name Primary?   S/P left rotator cuff repair 10/26/19 Yes   Tendonitis of upper biceps tendon of left  shoulder     I think he has some biceps tendinitis  I recommend injection which she agreed to  Procedure note biceps tendon injection Left biceps tendon was injected The patient gave verbal consent for cortisone injection Timeout confirmed the site of injection Medications used included 40 mg of Depo-Medrol and 3 mL 1% lidocaine After alcohol and ethyl chloride preparation the point of maximal tenderness was injected over the left  biceps tendon there were no complications

## 2020-12-14 NOTE — Patient Instructions (Signed)
Call if not better in 2 weeks

## 2020-12-19 ENCOUNTER — Telehealth: Payer: Self-pay | Admitting: Vascular Surgery

## 2020-12-19 NOTE — Telephone Encounter (Signed)
I called the patient to discuss his recent CT angiogram of his neck.  Ultrasound had suggested high-grade right carotid stenosis.  Velocities were in the 70 to 99%.  Duplex in May 2022 suggested at least this level of stenosis.  His CT angiogram was interpreted as 70% right internal carotid artery stenosis by the radiologist.  I reviewed his films.  It does look potentially somewhat tighter than this on the axial images but certainly does not look critical on the sagittal and coronal images.  He does have somewhat of a long plaque and does have some irregularity.  Since he is asymptomatic, I have recommended that we see him again in 6 months with carotid duplex and would reserve endarterectomy for progression on duplex or symptoms.  I did again review symptoms with him.

## 2021-01-18 ENCOUNTER — Other Ambulatory Visit: Payer: Self-pay

## 2021-01-18 ENCOUNTER — Ambulatory Visit (INDEPENDENT_AMBULATORY_CARE_PROVIDER_SITE_OTHER): Payer: Medicare Other | Admitting: Orthopedic Surgery

## 2021-01-18 DIAGNOSIS — M7522 Bicipital tendinitis, left shoulder: Secondary | ICD-10-CM

## 2021-01-18 DIAGNOSIS — Z9889 Other specified postprocedural states: Secondary | ICD-10-CM

## 2021-01-18 DIAGNOSIS — R7301 Impaired fasting glucose: Secondary | ICD-10-CM | POA: Insufficient documentation

## 2021-01-18 DIAGNOSIS — R0609 Other forms of dyspnea: Secondary | ICD-10-CM | POA: Insufficient documentation

## 2021-01-18 NOTE — Patient Instructions (Signed)
While we are working on your approval for MRI please go ahead and call to schedule your appointment with [facility] within at least one (1) week.   Central Scheduling [phone]  

## 2021-01-18 NOTE — Progress Notes (Signed)
FOLLOW UP   Encounter Diagnoses  Name Primary?   S/P left rotator cuff repair 10/26/19    Tendonitis of upper biceps tendon of left shoulder Yes     Chief Complaint  Patient presents with   Shoulder Pain    Right / also still has left shoulder pain s/p RCR 10/26/19 does not want anything with right shoulder until left is better     71 year old male status post left rotator cuff repair was in the hospital for something he thinks he may have moved his arm wrong but in any event he started having pain in the front of his shoulder and into his chest and with certain arm movements   He reports no additional trauma  At that time I thought he had biceps pathology and gave him an injection it did not seem to help  So now we will repeat the shoulder mri with Intra-articular contrast to evaluate the biceps and the cuff repair

## 2021-01-31 ENCOUNTER — Ambulatory Visit (HOSPITAL_COMMUNITY)
Admission: RE | Admit: 2021-01-31 | Discharge: 2021-01-31 | Disposition: A | Discharge status: Home / Self Care | Payer: Medicare Other | Source: Ambulatory Visit | Attending: Orthopedic Surgery | Admitting: Orthopedic Surgery

## 2021-01-31 ENCOUNTER — Other Ambulatory Visit: Payer: Self-pay

## 2021-01-31 ENCOUNTER — Encounter (HOSPITAL_COMMUNITY): Payer: Self-pay

## 2021-01-31 DIAGNOSIS — M7522 Bicipital tendinitis, left shoulder: Secondary | ICD-10-CM | POA: Insufficient documentation

## 2021-01-31 DIAGNOSIS — G8929 Other chronic pain: Secondary | ICD-10-CM | POA: Diagnosis not present

## 2021-01-31 DIAGNOSIS — Z9889 Other specified postprocedural states: Secondary | ICD-10-CM | POA: Insufficient documentation

## 2021-01-31 IMAGING — RF DG FLUORO GUIDE NDL PLC/BX
1 series · 1 of 1 positions shown · IV contrast (multihance)
Comparison: none

CLINICAL DATA: Chronic LEFT shoulder pain, prior LEFT rotator cuff
repair with question recurrent rotator cuff tear

EXAM:
LEFT SHOULDER INJECTION UNDER FLUOROSCOPY FOR MR ARTHROGRAPHY
TECHNIQUE: An appropriate skin entrance site was determined. The site was
marked, prepped with Betadine, draped in the usual sterile fashion,
and infiltrated locally with buffered Lidocaine. 22 gauge spinal
needle was advanced to the superomedial margin of the humeral head
under intermittent fluoroscopy. 1 ml of Lidocaine injected easily. A
mixture of 0.05 ml Multihance 5 mL sterile saline and 15 mL ml of
Omnipaque 300 was then used to opacify the LEFT shoulder capsule. No
immediate complication.
FLUOROSCOPY TIME:  Fluoroscopy Time:  0 minutes 30 seconds
Radiation Exposure Index (if provided by the fluoroscopic device):
2.7 mGy
Number of Acquired Spot Images: 1

[Series 1: cp_standard · 0.18mm/px · 1 of 1 slices shown]
[im 1/1]
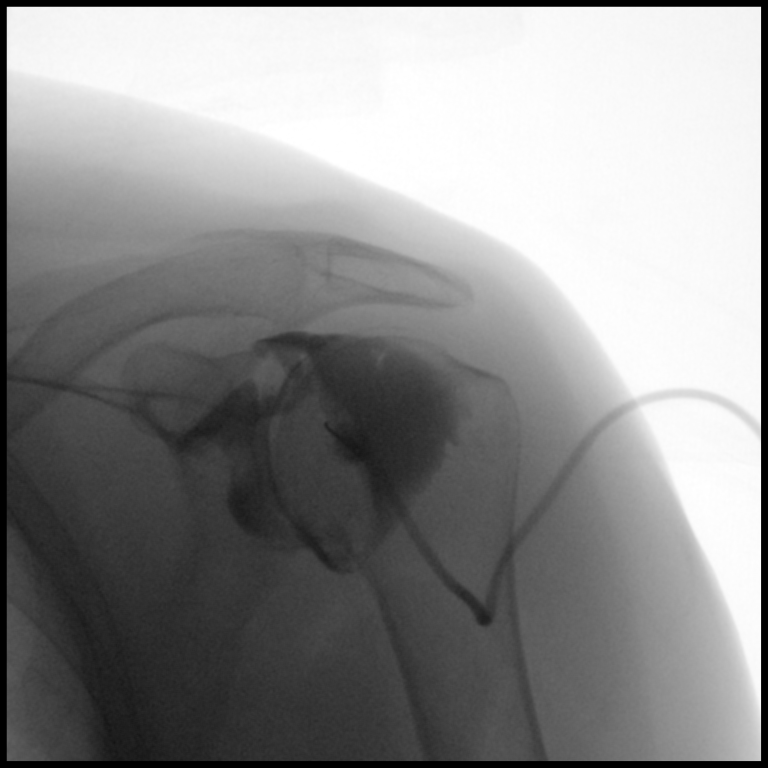

[1 of 1 positions shown; findings below may reference images not displayed]

FINDINGS: As above
IMPRESSION: Technically successful LEFT shoulder injection for MR arthrography.

## 2021-01-31 IMAGING — MR MR SHOULDER*L* W/ CM
6 series · 40 of 40 positions shown · IV contrast (agent unspecified)
Comparison: MR shoulder [DATE]; X-ray shoulder [DATE].

CLINICAL DATA: Continue left shoulder pain after surgery for years.
Tendinitis of upper biceps tendon of left shoulder. Clinical concern
for recurrent rotator cuff tear.

EXAM:
MR ARTHROGRAM OF THE LEFT SHOULDER
TECHNIQUE: Multiplanar, multisequence MR imaging of the left shoulder was
performed following the administration of intra-articular contrast.
CONTRAST:  See Injection Documentation.

[Series 6: T1 fat-sat · axial · left · 4.0mm · 0.55mm/px · z∈[-115,+1]mm · 6 of 25 slices shown (1 of 3)]
[im 1/25]
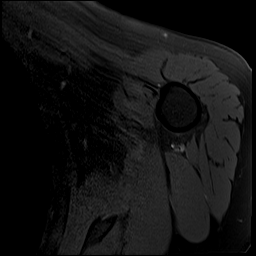
[im 5/25]
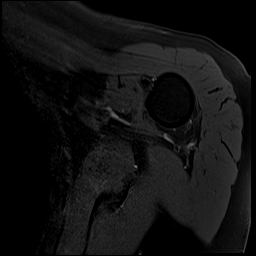
[im 10/25]
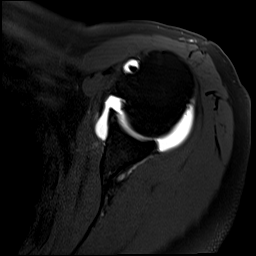
[im 15/25]
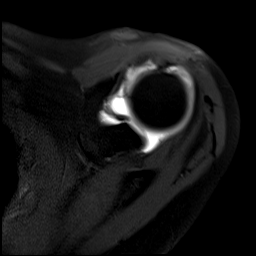
[im 20/25]
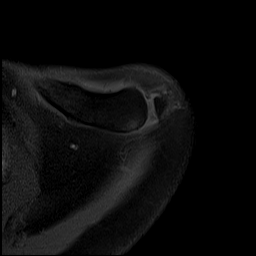
[im 25/25]
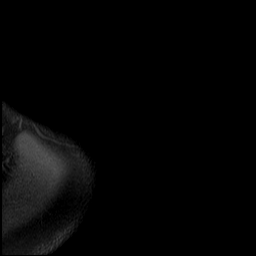

[Series 7: T2 fat-sat · coronal · left · 4.0mm · 0.44mm/px · 6 of 20 slices shown (1 of 2)]
[im 1/20]
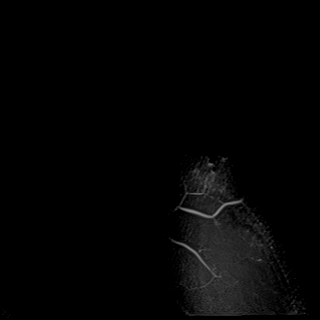
[im 4/20]
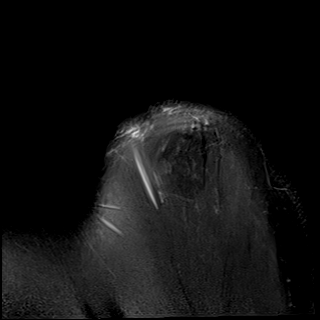
[im 8/20]
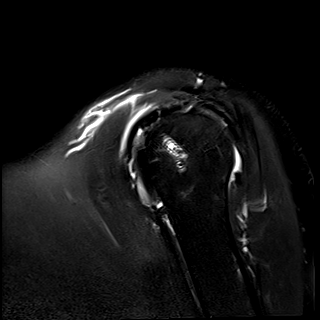
[im 12/20]
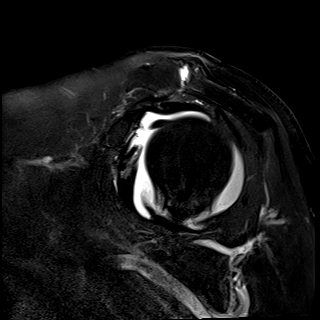
[im 16/20]
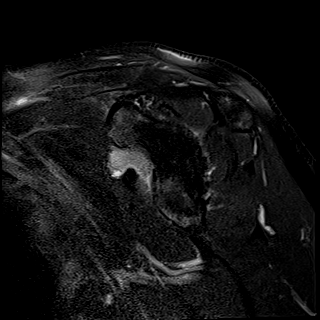
[im 20/20]
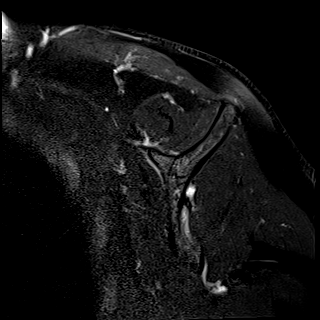

[Series 12: T1 fat-sat · sagittal · left · 4.0mm · 0.50mm/px · 7 of 26 slices shown (2 of 3)]
[im 1/26]
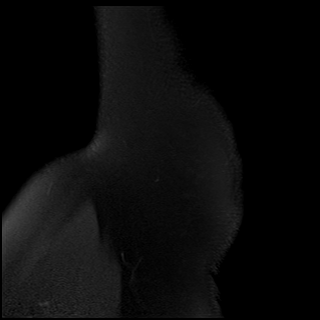
[im 5/26]
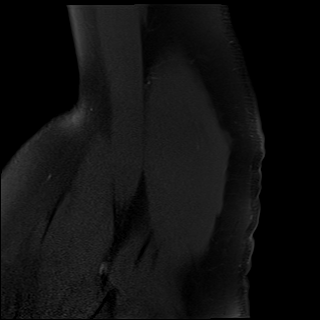
[im 9/26]
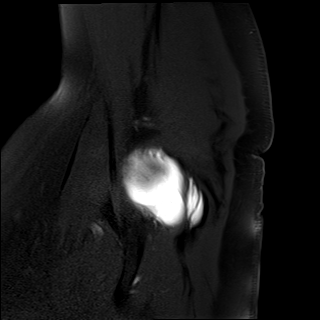
[im 13/26]
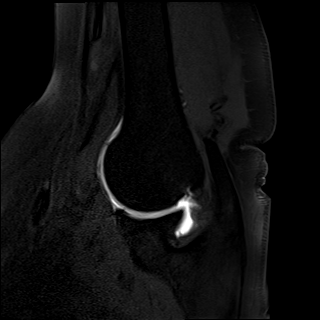
[im 17/26]
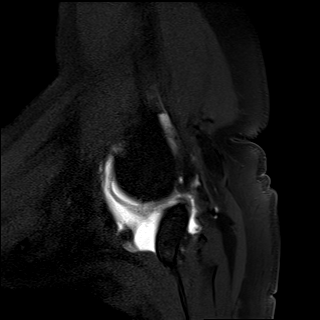
[im 21/26]
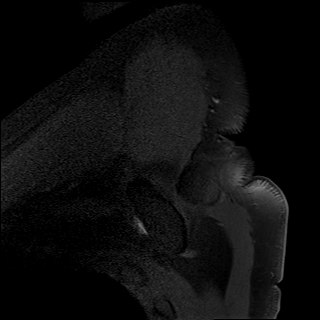
[im 26/26]
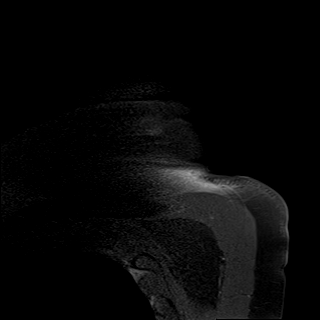

[Series 1011: T1 · oblique · left · 4.0mm · 0.39mm/px · 7 of 23 slices shown]
[im 1/23]
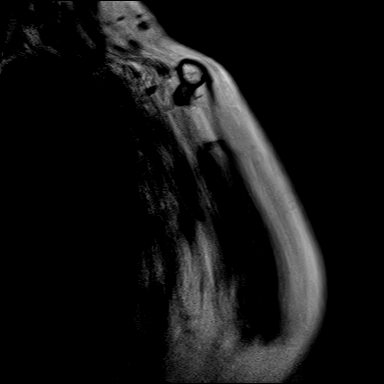
[im 4/23]
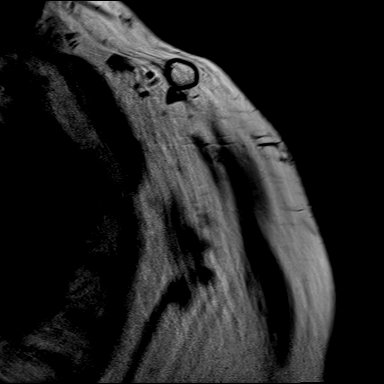
[im 8/23]
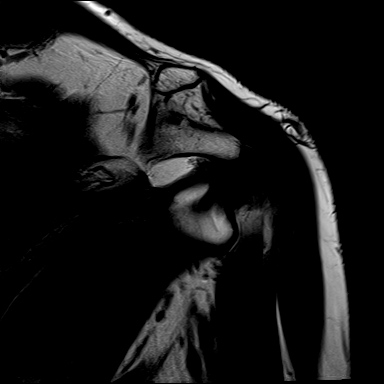
[im 12/23]
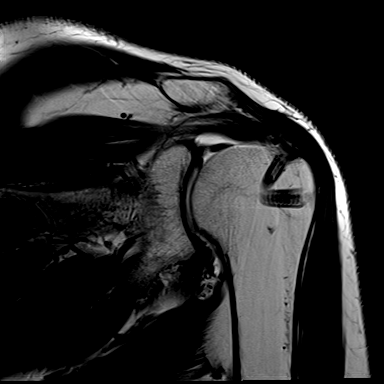
[im 15/23]
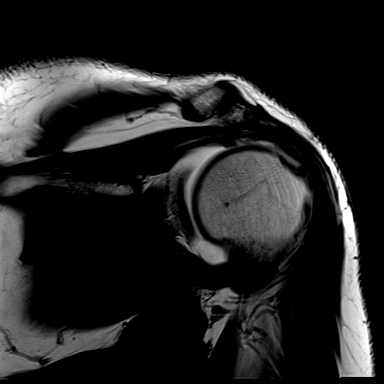
[im 19/23]
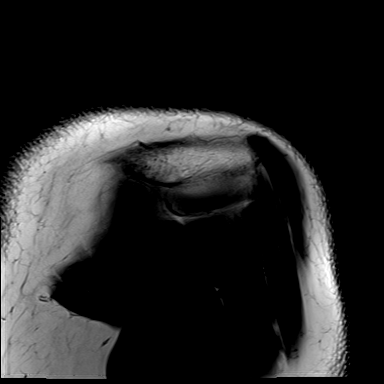
[im 23/23]
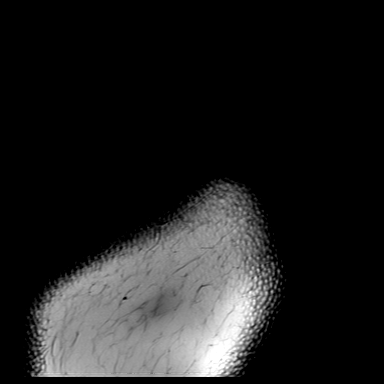

[Series 1015: T2 fat-sat · sagittal · left · 4.0mm · 0.44mm/px · 7 of 23 slices shown (2 of 2)]
[im 1/23]
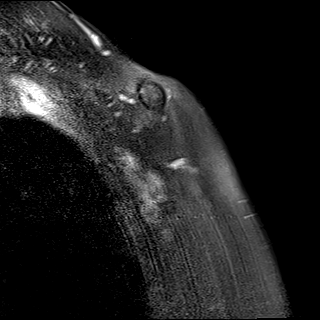
[im 4/23]
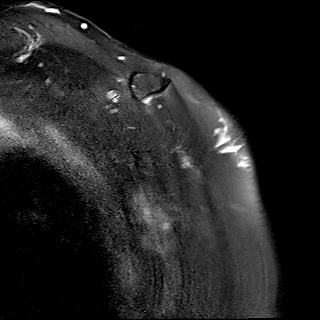
[im 8/23]
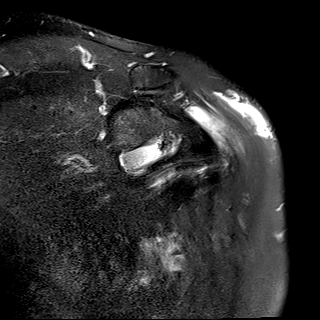
[im 12/23]
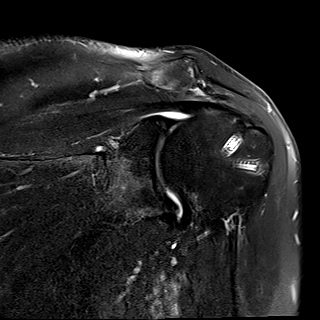
[im 15/23]
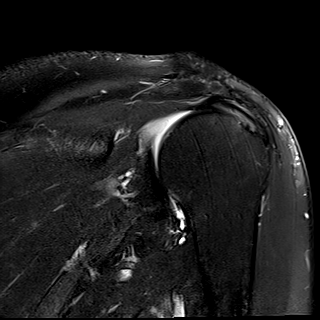
[im 19/23]
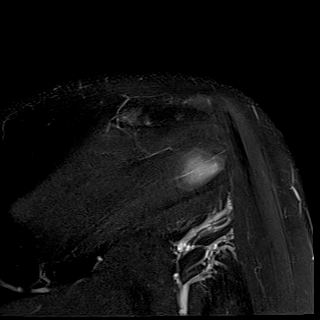
[im 23/23]
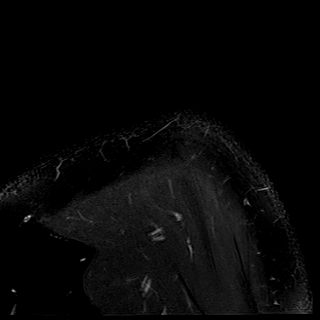

[Series 1019: T1 fat-sat · sagittal · left · 4.0mm · 0.44mm/px · 7 of 23 slices shown (3 of 3)]
[im 1/23]
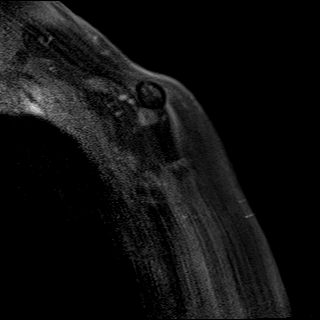
[im 4/23]
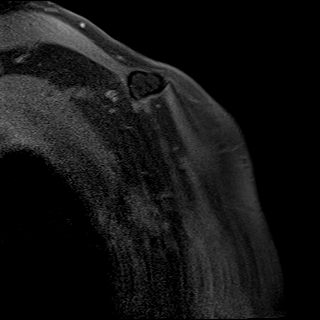
[im 8/23]
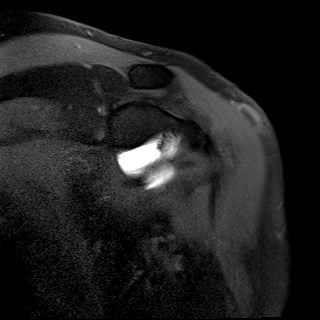
[im 12/23]
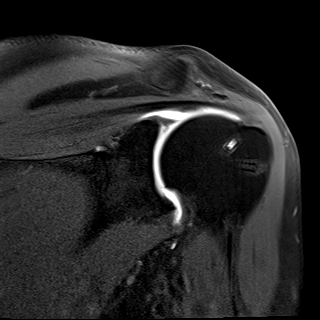
[im 15/23]
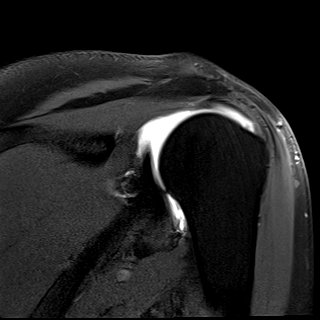
[im 19/23]
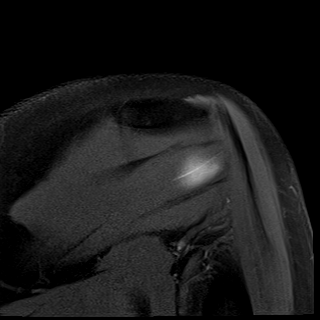
[im 23/23]
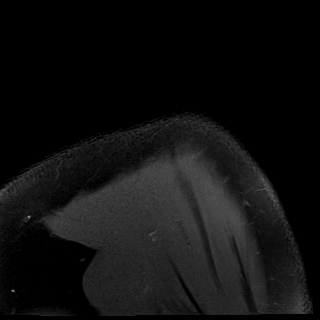

[40 of 40 positions shown; findings below may reference images not displayed]

FINDINGS: Rotator cuff: Interval postsurgical changes from rotator cuff
repair. Partial-thickness articular sided tearing of the
supraspinatus tendon distally and within the region of the critical
zone involving between 50-75% of the tendon depth (series [GR],
images 10-15). Tendinosis with focal insertional and interstitial
tear of the subscapularis tendon, new from prior (series 7, images
11-12). No full-thickness or retracted tear.

Muscles: Preserved bulk and signal intensity of the rotator cuff
musculature without edema, atrophy, or fatty infiltration.

Biceps long head:  Intra-articular biceps tendinosis.

Acromioclavicular Joint: Moderate degenerative changes of the AC
joint. Trace subacromial-subdeltoid bursal fluid. No contrast
extends into the bursal space.

Glenohumeral Joint: Adequately distended with injected contrast.
Mild diffuse chondral thinning without new focal defect.

Labrum:  No evidence of labral tear. No paralabral cyst.

Bones: No acute fracture. No dislocation. No bone marrow edema. No
marrow replacing bone lesion.

Other: None.
IMPRESSION: 1. Interval postsurgical changes from rotator cuff repair.
Partial-thickness articular sided tearing of the supraspinatus
tendon distally and within the region of the critical zone involving
between 50-75% of the tendon depth.
2. Tendinosis with focal insertional and interstitial tear of the
subscapularis tendon, new from prior. No full-thickness or retracted
rotator cuff tear.
3. Intra-articular biceps tendinosis.
4. Moderate degenerative changes of the AC joint.

## 2021-01-31 MED ORDER — GADOBUTROL 1 MMOL/ML IV SOLN
2.0000 mL | Freq: Once | INTRAVENOUS | Status: AC | PRN
  Administered 2021-01-31: 14:00:00 0.05 mL

## 2021-01-31 MED ORDER — SODIUM CHLORIDE (PF) 0.9 % IJ SOLN
INTRAMUSCULAR | Status: AC
  Administered 2021-01-31: 14:00:00 5 mL
  Filled 2021-01-31: qty 10

## 2021-01-31 MED ORDER — POVIDONE-IODINE 10 % EX SOLN
CUTANEOUS | Status: AC
  Administered 2021-01-31: 1
  Filled 2021-01-31: qty 14.8

## 2021-01-31 MED ORDER — IOHEXOL 180 MG/ML  SOLN
20.0000 mL | Freq: Once | INTRAMUSCULAR | Status: AC | PRN
  Administered 2021-01-31: 14:00:00 15 mL via INTRA_ARTICULAR

## 2021-01-31 MED ORDER — LIDOCAINE HCL (PF) 1 % IJ SOLN
INTRAMUSCULAR | Status: AC
  Administered 2021-01-31: 14:00:00 3.5 mL
  Filled 2021-01-31: qty 5

## 2021-02-01 ENCOUNTER — Encounter: Payer: Self-pay | Admitting: Vascular Surgery

## 2021-02-01 ENCOUNTER — Encounter: Payer: Self-pay | Admitting: Cardiology

## 2021-02-01 ENCOUNTER — Ambulatory Visit (INDEPENDENT_AMBULATORY_CARE_PROVIDER_SITE_OTHER): Payer: Medicare Other | Admitting: Orthopedic Surgery

## 2021-02-01 DIAGNOSIS — Z9889 Other specified postprocedural states: Secondary | ICD-10-CM

## 2021-02-01 DIAGNOSIS — M75112 Incomplete rotator cuff tear or rupture of left shoulder, not specified as traumatic: Secondary | ICD-10-CM

## 2021-02-01 NOTE — Progress Notes (Signed)
Chief Complaint  Patient presents with   Results    MRI LT shoulder    71 year old male status post rotator cuff repair 10/26/2019  Developed pain in the left shoulder during the time in which she was having a heart attack but after the heart attack he had persistent pain in the shoulder and despite injection and activity modification he still had pains  We sent for MRI with contrast  I read the MRI he definitely has a retear of his shoulder at a partial tear  Assessment and plan  71 year old male who had a heart attack also had carotid artery studies which showed some stenosis but it was felt this could be watched  He had a rotator cuff repair in October 21  He had an MRI because of persistent pain  The MRI shows a partial tear  He and I have discussed the pain and problems he is having with the shoulder and he wishes to have a rerepair of the shoulder  He will speak with Dr. Donnetta Hutching to discuss whether or not he can have the shoulder surgery with the underlying carotid stenosis   Encounter Diagnoses  Name Primary?   S/P left rotator cuff repair 10/26/19 Yes   Nontraumatic incomplete tear of left rotator cuff

## 2021-02-02 ENCOUNTER — Other Ambulatory Visit: Payer: Self-pay | Admitting: *Deleted

## 2021-02-02 MED ORDER — ROSUVASTATIN CALCIUM 40 MG PO TABS: 40.0000 mg | ORAL_TABLET | Freq: Every day | ORAL | 1 refills | Status: DC

## 2021-02-02 MED ORDER — METOPROLOL TARTRATE 100 MG PO TABS: 50.0000 mg | ORAL_TABLET | Freq: Two times a day (BID) | ORAL | 1 refills | Status: DC

## 2021-02-21 ENCOUNTER — Telehealth: Payer: Self-pay | Admitting: Orthopedic Surgery

## 2021-02-21 NOTE — Telephone Encounter (Signed)
Note in system from Dr Early:  James Wolf  I communicated with Dr. Aline Brochure as well.  There is no contraindication to your shoulder surgery from a carotid standpoint.  Should be fine to proceed.  Hope all goes well.  Todd Early   No indication about how long he needs to d/c the Plavix  Ok to post the surgery? What does he need ?

## 2021-02-21 NOTE — Telephone Encounter (Signed)
Patient called to inquire about when he can have his surgery; states he would like to schedule it as soon as possible.

## 2021-03-09 ENCOUNTER — Telehealth: Payer: Self-pay | Admitting: Orthopedic Surgery

## 2021-03-09 ENCOUNTER — Encounter: Payer: Self-pay | Admitting: Orthopedic Surgery

## 2021-03-09 NOTE — Telephone Encounter (Signed)
Dr. Donnetta Hutching cleared the patient for surgery ? ?He has a retear of his rotator cuff ? ?He will need an open rotator cuff repair with superior capsular reconstruction with a graft ? ?He will call back on Monday to let me know when he wants to schedule ? ?FINDINGS: ?Rotator cuff: Interval postsurgical changes from rotator cuff ?repair. Partial-thickness articular sided tearing of the ?supraspinatus tendon distally and within the region of the critical ?zone involving between 50-75% of the tendon depth (series 1019, ?images 10-15). Tendinosis with focal insertional and interstitial ?tear of the subscapularis tendon, new from prior (series 7, images ?11-12). No full-thickness or retracted tear. ?  ?Muscles: Preserved bulk and signal intensity of the rotator cuff ?musculature without edema, atrophy, or fatty infiltration. ?  ?Biceps long head:  Intra-articular biceps tendinosis. ?  ?Acromioclavicular Joint: Moderate degenerative changes of the AC ?joint. Trace subacromial-subdeltoid bursal fluid. No contrast ?extends into the bursal space. ?  ?Glenohumeral Joint: Adequately distended with injected contrast. ?Mild diffuse chondral thinning without new focal defect. ?

## 2021-03-09 NOTE — Progress Notes (Unsigned)
Rosetta Posner, MD  [name], MD ?Cherlynn Kaiser  ?OK to proceed with rotator cuff surgery from a vascular standpoint.  He does have moderate to severe stenosis which should not be a contraindication to general anesthesia or surgery.  My plan is to see him again in 6 months with repeat imaging of his asymptomatic carotid disease.  ?Todd   ?  ?   ?Previous Messages ?  ?----- Message -----  ?From: [name], MD  ?Sent: 02/01/2021   3:52 PM EST  ?To: Rosetta Posner, MD  ? ?Dear Dr. Donnetta Hutching  ? ?I have seen Barnetta Chapel and he is retorn his left rotator cuff and needs surgery  ? ?Based on his carotid studies and risk of stroke what is your recommendation on whether or not he can have the surgery  ? ?He will be calling you to discuss as well  ? ?Thanks  ? ?Cherlynn Kaiser  ?

## 2021-03-21 ENCOUNTER — Telehealth: Payer: Self-pay | Admitting: Cardiology

## 2021-03-21 NOTE — Telephone Encounter (Signed)
Patient has follow-up with Dr. Harl Bowie near the end of this month.  Cardiac clearance can be addressed at that time.  We will remove from preop pool. ?

## 2021-03-21 NOTE — Telephone Encounter (Signed)
? ?  Pre-operative Risk Assessment  ?  ?Patient Name: James Wolf  ?DOB: 03-Feb-1950 ?MRN: [id number]  ? ?  ? ?Request for Surgical Clearance   ? ?Procedure:   left shoulder arthroscopic RCR  ? ?Date of Surgery:  Clearance TBD                              ?   ?Surgeon:  Dr. Stann Mainland  ?Surgeon's Group or Practice Name:  [facility]  ?Phone number:  [phone] ?Fax number:  [phone] ?  ?Type of Clearance Requested:   ?- Pharmacy:  Hold Clopidogrel (Plavix) does not indicate how long ?  ?Type of Anesthesia:  Not Indicated ?  ?Additional requests/questions:   ? ?Signed, ?Malanie C Hildebrandt   ?03/21/2021, 11:58 AM   ?

## 2021-04-02 ENCOUNTER — Ambulatory Visit (INDEPENDENT_AMBULATORY_CARE_PROVIDER_SITE_OTHER): Payer: Medicare Other | Admitting: Cardiology

## 2021-04-02 ENCOUNTER — Encounter: Payer: Self-pay | Admitting: Cardiology

## 2021-04-02 VITALS — BP 105/55 | HR 62 | Ht 65.0 in | Wt 167.0 lb

## 2021-04-02 DIAGNOSIS — I6523 Occlusion and stenosis of bilateral carotid arteries: Secondary | ICD-10-CM | POA: Diagnosis not present

## 2021-04-02 DIAGNOSIS — E782 Mixed hyperlipidemia: Secondary | ICD-10-CM

## 2021-04-02 DIAGNOSIS — I2510 Atherosclerotic heart disease of native coronary artery without angina pectoris: Secondary | ICD-10-CM | POA: Diagnosis not present

## 2021-04-02 DIAGNOSIS — Z01810 Encounter for preprocedural cardiovascular examination: Secondary | ICD-10-CM | POA: Diagnosis not present

## 2021-04-02 MED ORDER — CLOPIDOGREL BISULFATE 75 MG PO TABS: 75.0000 mg | ORAL_TABLET | Freq: Every day | ORAL | Status: AC

## 2021-04-02 NOTE — Progress Notes (Signed)
? ? ? ?Clinical Summary ?[name] is a 71 y.o.male seen today for follow up of the following medical problems.  ? ?1.CAD ?- admit 05/2020 with NSTEMI ?- cardiac catheterization by Dr. Martinique with critical left main and multivessel CAD. ?He underwent CABG x3 (LIMA-LAD, SVG-OM, SVG-distal RCA) ?  ?- no recent chest pains.  ?- compliant with meds ?  ?2. HTN ?-he is compliant with meds ?  ?  ?  ?3. Hyperlipidemia ?- compliant with meds ?  ?  ?4. Carotid stenosis ?- 05/2020 RICA 60-79%, mild RICA ?08/1446 RICA 18-56%, LICA 31-49% ?- followed by vascular ?  ?  ?5.Preoperative evaluation ?- considering shoulder surgery ?- can walk up 1-2 flights of stairs daily with signifaicant symptoms.  ? ? ? ?6. COPD ? ? ?43. Prior bariartic surgery ? ?8. Hyperlipidemia ?- 10/2020 TC 78 TG 55 HDL 46 LDL 21 ?- he is on crestor, zetia ? ?  ?  ?Past Medical History:  ?Diagnosis Date  ? Anemia   ? Aspiration pneumonia ([place])   ? CAD (coronary artery disease)   ? a. s/p CABG 05/2020.  ? Cancer [facility])   ? esophageal cancer 1995  ? Carotid artery disease ([place])   ? Chronic kidney disease, stage 3a ([place])   ? COPD (chronic obstructive pulmonary disease) ([place])   ? GERD (gastroesophageal reflux disease)   ? History of kidney stones   ? Hyperlipidemia   ? Hypertension   ? Joint pain   ? Lung nodule   ? Osteomyelitis ([place])   ? Osteoporosis   ? Rheumatic fever   ? 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 LIMA to LAD, SVG to OM, SVG to RCA  ? ? ? ?Allergies  ?Allergen Reactions  ? Zithromax [Azithromycin] Diarrhea  ?  Causes pt's stomach to "tear up" does not want to take medication again.   ? ? ? ?Current Outpatient Medications  ?Medication Sig Dispense Refill  ? albuterol (VENTOLIN HFA) 108 (90 Base) MCG/ACT inhaler Inhale 1-2 puffs into the lungs every 6 (six) hours as needed for wheezing or shortness of breath.    ? aspirin EC 81 MG EC tablet Take 1 tablet (81 mg total) by mouth daily. Swallow whole. 30 tablet 11  ? B Complex Vitamins (VITAMIN B COMPLEX PO) Take 1  tablet by mouth daily.     ? cholecalciferol (VITAMIN D3) 25 MCG (1000 UNIT) tablet Take 1,000 Units by mouth daily.    ? clopidogrel (PLAVIX) 75 MG tablet Take 1 tablet (75 mg total) by mouth daily. 30 tablet 11  ? diclofenac sodium (VOLTAREN) 1 % GEL Apply 2 g topically 2 (two) times daily as needed (pain).     ? esomeprazole (NEXIUM) 40 MG capsule Take 40 mg by mouth 2 (two) times daily.     ? ezetimibe (ZETIA) 10 MG tablet TAKE 1 TABLET BY MOUTH EVERY DAY 90 tablet 1  ? metoprolol tartrate (LOPRESSOR) 100 MG tablet Take 0.5 tablets (50 mg total) by mouth 2 (two) times daily. 90 tablet 1  ? rosuvastatin (CRESTOR) 40 MG tablet Take 1 tablet (40 mg total) by mouth daily at 6 PM. 90 tablet 1  ? tamsulosin (FLOMAX) 0.4 MG CAPS capsule Take 0.4 mg by mouth daily.    ? ?No current facility-administered medications for this visit.  ? ? ? ?Past Surgical History:  ?Procedure Laterality Date  ? CORONARY ARTERY BYPASS GRAFT N/A 06/02/2020  ? Procedure: CORONARY ARTERY BYPASS GRAFTING (CABG) TIMES THREE USING RIGHT  GREATER SAPHENOUS VEIN HARVESTED ENDOSCOPICALLY AND LEFT INTERNAL MAMMARY ARTERY.;  Surgeon: Rexene Alberts, MD;  Location: [facility];  Service: Open Heart Surgery;  Laterality: N/A;  ? CYSTOSCOPY/RETROGRADE/URETEROSCOPY/STONE EXTRACTION WITH BASKET    ? ESOPHAGECTOMY    ? Ivor-Lewis esophagectomy 1995 [facility]  ? IR THORACENTESIS ASP PLEURAL SPACE W/IMG GUIDE  06/06/2020  ? LEFT HEART CATH AND CORONARY ANGIOGRAPHY N/A 05/30/2020  ? Procedure: LEFT HEART CATH AND CORONARY ANGIOGRAPHY;  Surgeon: Martinique, Peter M, MD;  Location: [facility];  Service: Cardiovascular;  Laterality: N/A;  ? partial esophageal ejectory    ? SHOULDER OPEN ROTATOR CUFF REPAIR Left 10/26/2019  ? Procedure: ROTATOR CUFF REPAIR SHOULDER OPEN LEFT;  Surgeon: [name], MD;  Location: [facility];  Service: Orthopedics;  Laterality: Left;  ? ? ? ?Allergies  ?Allergen Reactions  ? Zithromax [Azithromycin] Diarrhea  ?  Causes pt's stomach to  "tear up" does not want to take medication again.   ? ? ? ? ?Family History  ?Problem Relation Age of Onset  ? Heart disease Mother   ? CAD Father   ? CVA Father   ? ? ? ?Social History ?[name] reports that he quit smoking about 28 years ago. His smoking use included cigarettes. He has a 25.00 pack-year smoking history. He has never used smokeless tobacco. ?[name] reports that he does not currently use alcohol. ? ? ?Review of Systems ?CONSTITUTIONAL: No weight loss, fever, chills, weakness or fatigue.  ?HEENT: Eyes: No visual loss, blurred vision, double vision or yellow sclerae.No hearing loss, sneezing, congestion, runny nose or sore throat.  ?SKIN: No rash or itching.  ?CARDIOVASCULAR: per hpi ?RESPIRATORY: No shortness of breath, cough or sputum.  ?GASTROINTESTINAL: No anorexia, nausea, vomiting or diarrhea. No abdominal pain or blood.  ?GENITOURINARY: No burning on urination, no polyuria ?NEUROLOGICAL: No headache, dizziness, syncope, paralysis, ataxia, numbness or tingling in the extremities. No change in bowel or bladder control.  ?MUSCULOSKELETAL: No muscle, back pain, joint pain or stiffness.  ?LYMPHATICS: No enlarged nodes. No history of splenectomy.  ?PSYCHIATRIC: No history of depression or anxiety.  ?ENDOCRINOLOGIC: No reports of sweating, cold or heat intolerance. No polyuria or polydipsia.  ?. ? ? ?Physical Examination ?Today's Vitals  ? 04/02/21 1422 04/02/21 1459  ?BP: 90/60 (!) 105/55  ?Pulse: 62   ?SpO2: 98%   ?Weight: 167 lb (75.8 kg)   ?Height: '5\' 5"'$  (1.651 m)   ? ?Body mass index is 27.79 kg/m?. ? ?Gen: resting comfortably, no acute distress ?HEENT: no scleral icterus, pupils equal round and reactive, no palptable cervical adenopathy,  ?CV:RRR, no mr/g no jvd ?Resp: Clear to auscultation bilaterally ?GI: abdomen is soft, non-tender, non-distended, normal bowel sounds, no hepatosplenomegaly ?MSK: extremities are warm, no edema.  ?Skin: warm, no rash ?Neuro:  no focal deficits ?Psych:  appropriate affect ? ? ?Diagnostic Studies ? ? ? ? ?Assessment and Plan  ?CAD ?- no symptoms, almost 1 year out from CABG ?- can d/c plavix 06/02/21. Ok to hold plavix to that for surgery ?  ?2. Carotid stenosis ?- continue to follow with vascular ?  ?3. Hyperlipidemia ?- at goal, continue current meds ? ?4. Preoperative evaluation ?- doing well after CABG last year, no cardiac symptoms ?- tolerates great than 4 METs without symptoms ?- ok to proceed with surgery. Can hold plavix 5 days prior resume day after. Continue aspirin, if neccesary can hold 7 days prior and resume day after ? ? ?F/u 6 months ? ? ? ? ? ?  Arnoldo Lenis, M.D., F.A.C.C. ?

## 2021-04-02 NOTE — Patient Instructions (Signed)
Medication Instructions:  ?Stop Plavix on 06/02/2021.  ?Continue all other medications.    ? ?Labwork: ?none ? ?Testing/Procedures: ?none ? ?Follow-Up: ?6 months  ? ?Any Other Special Instructions Will Be Listed Below (If Applicable). ? ? ?If you need a refill on your cardiac medications before your next appointment, please call your pharmacy. ? ?

## 2021-04-03 ENCOUNTER — Encounter: Payer: Self-pay | Admitting: *Deleted

## 2021-05-02 NOTE — Patient Instructions (Addendum)
DUE TO COVID-19 ONLY TWO VISITORS  (aged 70 and older)  ARE ALLOWED TO COME WITH YOU AND STAY IN THE WAITING ROOM ONLY DURING PRE OP AND PROCEDURE.   ?**NO VISITORS ARE ALLOWED IN THE SHORT STAY AREA OR RECOVERY ROOM!!** ? ? Your procedure is scheduled on: 05/11/21 ? ? Report to [facility] Main Entrance ? ?  Report to admitting at 11:00 AM ? ? Call this number if you have problems the morning of surgery [phone] ? ? Do not eat food :After Midnight. ? ? After Midnight you may have the following liquids until 10:15 AM DAY OF SURGERY ? ?Water ?Black Coffee (sugar ok, NO MILK/CREAM OR CREAMERS)  ?Tea (sugar ok, NO MILK/CREAM OR CREAMERS) regular and decaf                             ?Plain Jell-O (NO RED)                                           ?Fruit ices (not with fruit pulp, NO RED)                                     ?Popsicles (NO RED)                                                                  ?Juice: apple, WHITE grape, WHITE cranberry ?Sports drinks like Gatorade (NO RED) ?Clear broth(vegetable,chicken,beef)  ?  ?The day of surgery:  ?Drink ONE (1) Pre-Surgery Clear Ensure at 10:15 AM the morning of surgery. Drink in one sitting. Do not sip.  ?This drink was given to you during your hospital  ?pre-op appointment visit. ?Nothing else to drink after completing the  ?Pre-Surgery Clear Ensure. ?  ?       If you have questions, please contact your surgeon?s office. ? ? ?FOLLOW BOWEL PREP AND ANY ADDITIONAL PRE OP INSTRUCTIONS YOU RECEIVED FROM YOUR SURGEON'S OFFICE!!! ?  ?  ?Oral Hygiene is also important to reduce your risk of infection.                                    ?Remember - BRUSH YOUR TEETH THE MORNING OF SURGERY WITH YOUR REGULAR TOOTHPASTE ? ? Take these medicines the morning of surgery with A SIP OF WATER: Inhalers, Nexium, Metoprolol, Flomax.  ? ?These are anesthesia recommendations for holding your anticoagulants.  Please contact your prescribing physician to confirm IF it is safe  to hold your anticoagulants for this length of time. ? ? ?Eliquis Apixaban   72 hours   ?Xarelto Rivaroxaban   72 hours  ?Plavix Clopidogrel   120 hours  ?Pletal Cilostazol   120 hours  ?  ? ?DO NOT TAKE ANY ORAL DIABETIC MEDICATIONS DAY OF YOUR SURGERY ? ?How to Manage Your Diabetes ?Before and After Surgery ? ?Why is it important to control my blood sugar before and after surgery? ?Improving  blood sugar levels before and after surgery helps healing and can limit problems. ?A way of improving blood sugar control is eating a healthy diet by: ? Eating less sugar and carbohydrates ? Increasing activity/exercise ? Talking with your doctor about reaching your blood sugar goals ?High blood sugars (greater than 180 mg/dL) can raise your risk of infections and slow your recovery, so you will need to focus on controlling your diabetes during the weeks before surgery. ?Make sure that the doctor who takes care of your diabetes knows about your planned surgery including the date and location. ? ?How do I manage my blood sugar before surgery? ?Check your blood sugar at least 4 times a day, starting 2 days before surgery, to make sure that the level is not too high or low. ?Check your blood sugar the morning of your surgery when you wake up and every 2 hours until you get to the Short Stay unit. ?If your blood sugar is less than 70 mg/dL, you will need to treat for low blood sugar: ?Do not take insulin. ?Treat a low blood sugar (less than 70 mg/dL) with ? cup of clear juice (cranberry or apple), 4 glucose tablets, OR glucose gel. ?Recheck blood sugar in 15 minutes after treatment (to make sure it is greater than 70 mg/dL). If your blood sugar is not greater than 70 mg/dL on recheck, call [phone] for further instructions. ?Report your blood sugar to the short stay nurse when you get to Short Stay. ? ?If you are admitted to the hospital after surgery: ?Your blood sugar will be checked by the staff and you will probably be  given insulin after surgery (instead of oral diabetes medicines) to make sure you have good blood sugar levels. ?The goal for blood sugar control after surgery is 80-180 mg/dL. ? ? ?WHAT DO I DO ABOUT MY DIABETES MEDICATION? ? ?Do not take oral diabetes medicines (pills) the morning of surgery. ? ?THE NIGHT BEFORE SURGERY, do not take Glipizide.     ? ? ?THE MORNING OF SURGERY, do not take Glipizide.  ? ?Reviewed and Endorsed by [facility] Patient Education Committee, August 2015  ? ?           You may not have any metal on your body including jewelry, and body piercing ? ?           Do not wear lotions, powders, cologne, or deodorant ? ?            Men may shave face and neck. ? ? Do not bring valuables to the hospital. [facility] NOT ?            RESPONSIBLE   FOR VALUABLES. ? ? Contacts, dentures or bridgework may not be worn into surgery. ?  ? Patients discharged on the day of surgery will not be allowed to drive home.  Someone NEEDS to stay with you for the first 24 hours after anesthesia. ? ?            Please read over the following fact sheets you were given: IF [place] [phone]- Apolonio Schneiders ? ?   [facility] - Preparing for Surgery ?Before surgery, you can play an important role.  Because skin is not sterile, your skin needs to be as free of germs as possible.  You can reduce the number of germs on your skin by washing with CHG (chlorahexidine gluconate) soap before surgery.  CHG is  an antiseptic cleaner which kills germs and bonds with the skin to continue killing germs even after washing. ?Please DO NOT use if you have an allergy to CHG or antibacterial soaps.  If your skin becomes reddened/irritated stop using the CHG and inform your nurse when you arrive at Short Stay. ?Do not shave (including legs and underarms) for at least 48 hours prior to the first CHG shower.  You may shave your face/neck. ? ?Please follow these instructions carefully: ? 1.   Shower with CHG Soap the night before surgery and the  morning of surgery. ? 2.  If you choose to wash your hair, wash your hair first as usual with your normal  shampoo. ? 3.  After you shampoo, rinse your hair and body thoroughly to remove the shampoo.                            ? 4.  Use CHG as you would any other liquid soap.  You can apply chg directly to the skin and wash.  Gently with a scrungie or clean washcloth. ? 5.  Apply the CHG Soap to your body ONLY FROM THE NECK DOWN.   Do   not use on face/ open      ?                     Wound or open sores. Avoid contact with eyes, ears mouth and   genitals (private parts).  ?                     Production manager,  Genitals (private parts) with your normal soap. ?            6.  Wash thoroughly, paying special attention to the area where your    surgery  will be performed. ? 7.  Thoroughly rinse your body with warm water from the neck down. ? 8.  DO NOT shower/wash with your normal soap after using and rinsing off the CHG Soap. ?               9.  Pat yourself dry with a clean towel. ?           10.  Wear clean pajamas. ?           11.  Place clean sheets on your bed the night of your first shower and do not  sleep with pets. ?Day of Surgery : ?Do not apply any lotions/deodorants the morning of surgery.  Please wear clean clothes to the hospital/surgery center. ? ?FAILURE TO FOLLOW THESE INSTRUCTIONS MAY RESULT IN THE CANCELLATION OF YOUR SURGERY ? ?PATIENT SIGNATURE_________________________________ ? ?NURSE SIGNATURE__________________________________ ? ?________________________________________________________________________  ?

## 2021-05-02 NOTE — Progress Notes (Addendum)
COVID Vaccine Completed: yes x3 ?Date COVID Vaccine completed: ?Has received booster: ?COVID vaccine manufacturer: [employer]  ? ?Date of COVID positive in last 90 days: no ? ?PCP - Sherrilee Gilles, DO ?Cardiologist - Carlyle Dolly, MD ? ?Cardiac clearance 04/02/21 by Carlyle Dolly in [place]  ? ?Chest x-ray - 10/15/20 Epic ?EKG - 10/16/20 Epic ?Stress Test - not for a few years per pt ?ECHO - 10/16/20 Epic ?Cardiac Cath - 05/30/20 Epic ?Pacemaker/ICD device last checked: no ?Spinal Cord Stimulator: no ? ?Bowel Prep - no ? ?Sleep Study - no ?CPAP -  ? ?Fasting Blood Sugar - pre DM, only checks when symptomatic  ? ? ?Blood Thinner Instructions: Plavix, hold 5 days ?Aspirin Instructions:ASA 81, hold 7 days ?Last Dose: Plavix and ASA 81 last dose 04/24/21 1100, held for endoscopy. Per patient cards okay not starting again until after arthroscopy ? ?Activity level: Can go up a flight of stairs and perform activities of daily living without stopping and without symptoms of chest pain or shortness of breath. ?     ? ?Anesthesia review: HTN, CAD, NSTEMI, COPD, CABGx3, anemia  ? ?Patient denies shortness of breath, fever, cough and chest pain at PAT appointment ? ? ?Patient verbalized understanding of instructions that were given to them at the PAT appointment. Patient was also instructed that they will need to review over the PAT instructions again at home before surgery.  ?

## 2021-05-03 ENCOUNTER — Encounter (HOSPITAL_COMMUNITY): Payer: Self-pay

## 2021-05-03 ENCOUNTER — Encounter (HOSPITAL_COMMUNITY)
Admission: RE | Admit: 2021-05-03 | Discharge: 2021-05-03 | Disposition: A | Discharge status: Home / Self Care | Payer: Medicare Other | Source: Ambulatory Visit | Attending: Orthopedic Surgery | Admitting: Orthopedic Surgery

## 2021-05-03 VITALS — BP 122/77 | HR 60 | Temp 98.3°F | Resp 16 | Ht 65.5 in | Wt 162.4 lb

## 2021-05-03 DIAGNOSIS — Z01812 Encounter for preprocedural laboratory examination: Secondary | ICD-10-CM | POA: Insufficient documentation

## 2021-05-03 DIAGNOSIS — Z7982 Long term (current) use of aspirin: Secondary | ICD-10-CM | POA: Insufficient documentation

## 2021-05-03 DIAGNOSIS — X58XXXA Exposure to other specified factors, initial encounter: Secondary | ICD-10-CM | POA: Insufficient documentation

## 2021-05-03 DIAGNOSIS — I25110 Atherosclerotic heart disease of native coronary artery with unstable angina pectoris: Secondary | ICD-10-CM | POA: Insufficient documentation

## 2021-05-03 DIAGNOSIS — S46212A Strain of muscle, fascia and tendon of other parts of biceps, left arm, initial encounter: Secondary | ICD-10-CM | POA: Diagnosis not present

## 2021-05-03 DIAGNOSIS — Z7902 Long term (current) use of antithrombotics/antiplatelets: Secondary | ICD-10-CM | POA: Insufficient documentation

## 2021-05-03 DIAGNOSIS — R7301 Impaired fasting glucose: Secondary | ICD-10-CM | POA: Diagnosis not present

## 2021-05-03 DIAGNOSIS — Z951 Presence of aortocoronary bypass graft: Secondary | ICD-10-CM | POA: Insufficient documentation

## 2021-05-03 DIAGNOSIS — I129 Hypertensive chronic kidney disease with stage 1 through stage 4 chronic kidney disease, or unspecified chronic kidney disease: Secondary | ICD-10-CM | POA: Diagnosis not present

## 2021-05-03 DIAGNOSIS — Z87891 Personal history of nicotine dependence: Secondary | ICD-10-CM | POA: Insufficient documentation

## 2021-05-03 DIAGNOSIS — J449 Chronic obstructive pulmonary disease, unspecified: Secondary | ICD-10-CM | POA: Diagnosis not present

## 2021-05-03 DIAGNOSIS — M75102 Unspecified rotator cuff tear or rupture of left shoulder, not specified as traumatic: Secondary | ICD-10-CM | POA: Diagnosis not present

## 2021-05-03 LAB — CBC
HCT: 38.5 % — ABNORMAL LOW (ref 39.0–52.0)
Hemoglobin: 12.1 g/dL — ABNORMAL LOW (ref 13.0–17.0)
MCH: 28.4 pg (ref 26.0–34.0)
MCHC: 31.4 g/dL (ref 30.0–36.0)
MCV: 90.4 fL (ref 80.0–100.0)
Platelets: 158 10*3/uL (ref 150–400)
RBC: 4.26 MIL/uL (ref 4.22–5.81)
RDW: 15.9 % — ABNORMAL HIGH (ref 11.5–15.5)
WBC: 6.3 10*3/uL (ref 4.0–10.5)
nRBC: 0 % (ref 0.0–0.2)

## 2021-05-03 LAB — BASIC METABOLIC PANEL
Anion gap: 6 (ref 5–15)
BUN: 12 mg/dL (ref 8–23)
CO2: 28 mmol/L (ref 22–32)
Calcium: 9.3 mg/dL (ref 8.9–10.3)
Chloride: 107 mmol/L (ref 98–111)
Creatinine, Ser: 1.16 mg/dL (ref 0.61–1.24)
GFR, Estimated: >60 mL/min (ref >60)
Glucose, Bld: 130 mg/dL — ABNORMAL HIGH (ref 70–99)
Potassium: 4.7 mmol/L (ref 3.5–5.1)
Sodium: 141 mmol/L (ref 135–145)

## 2021-05-03 LAB — GLUCOSE, CAPILLARY: Glucose-Capillary: 106 mg/dL — ABNORMAL HIGH (ref 70–99)

## 2021-05-04 ENCOUNTER — Encounter (HOSPITAL_COMMUNITY): Payer: Self-pay | Admitting: Anesthesiology

## 2021-05-04 ENCOUNTER — Encounter (HOSPITAL_COMMUNITY): Payer: Self-pay | Admitting: Physician Assistant

## 2021-05-04 NOTE — Anesthesia Preprocedure Evaluation (Deleted)
Anesthesia Evaluation  ? ? ?Reviewed: ?Allergy & Precautions, Patient's Chart, lab work & pertinent test results ? ?History of Anesthesia Complications ?Negative for: history of anesthetic complications ? ?Airway ? ? ? ? ? ? ? Dental ?  ?Pulmonary ?COPD, former smoker,  ?  ? ? ? ? ? ? ? Cardiovascular ?hypertension, Pt. on medications and Pt. on home beta blockers ?+ CAD, + Past MI and + CABG  ? ? ? ?'22 Carotid US - Right: Heterogeneous and partially calcified plaque of the right carotid bifurcation contributes to 70%-99% stenosis by established duplex criteria. Given the waveform, the stenosis is favored to be critical ?stenosis.? ?Left: Heterogeneous and partially calcified plaque of the left carotid bifurcation contributes to 50%-69% stenosis by established duplex criteria. ? ?  ?Neuro/Psych ?negative neurological ROS ? negative psych ROS  ? GI/Hepatic ?Neg liver ROS, GERD  Medicated, ?Hx esophageal cancer ? ?  ?Endo/Other  ?diabetes, Type 2, Oral Hypoglycemic Agents ? Renal/GU ?CRFRenal disease  ? ?  ?Musculoskeletal ?negative musculoskeletal ROS ?(+)  ? Abdominal ?  ?Peds ? Hematology ? ?(+) Blood dyscrasia, anemia ,  ?On Plavix ?   ?Anesthesia Other Findings ? ? Reproductive/Obstetrics ? ?  ? ? ? ? ? ? ? ? ? ? ? ? ? ?  ?  ? ? ? ? ? ? ? ?Anesthesia Physical ?Anesthesia Plan ? ?ASA:  ? ?Anesthesia Plan:   ? ?Post-op Pain Management:   ? ?Induction:  ? ?PONV Risk Score and Plan:  ? ?Airway Management Planned:  ? ?Additional Equipment:  ? ?Intra-op Plan:  ? ?Post-operative Plan:  ? ?Informed Consent:  ? ?Plan Discussed with:  ? ?Anesthesia Plan Comments: (Discussed with Dr. Stann Mainland. Patient will follow up with Dr. Donnetta Hutching regarding his carotid disease prior to proceeding with surgery. Surgery will be rescheduled after he sees Dr. Donnetta Hutching if no intervention required on his carotid arteries.)  ? ? ? ? ?Anesthesia Quick Evaluation ? ?

## 2021-05-04 NOTE — Progress Notes (Signed)
Anesthesia Chart Review ? ? Case: [id number] Date/Time: 05/11/21 1300  ? Procedure: SHOULDER ARTHROSCOPY WITH ROTATOR CUFF REPAIR BICEPS TENODESIS (Left) - 120  ? Anesthesia type: Choice  ? Pre-op diagnosis: Left shoulder rotator cuff tear, biceps tear  ? Location: [facility] ROOM 08 / [facility]  ? Surgeons: [name], MD  ? ?  ? ? ?DISCUSSION:70 y.o. former smoker with h/o HTN, COPD, CAD (CABG 5/22), left shoulder rotator cuff tear, biceps tear scheduled for above procedure 05/11/2021 with Dr. [name].  ? ?Pt last seen by cardiology 04/02/2021. Per OV note, "Preoperative evaluation ?- doing well after CABG last year, no cardiac symptoms ?- tolerates great than 4 METs without symptoms ?- ok to proceed with surgery. Can hold plavix 5 days prior resume day after. Continue aspirin, if neccesary can hold 7 days prior and resume day after" ? ?Anticipate pt can proceed with planned procedure barring acute status change.   ?VS: BP 122/77   Pulse 60   Temp 36.8 ?C (Oral)   Resp 16   Ht 5' 5.5" (1.664 m)   Wt 73.7 kg   SpO2 98%   BMI 26.61 kg/m?  ? ?PROVIDERS: ?Sherrilee Gilles, DO is PCP  ? ?Cardiologist - Carlyle Dolly, MD ? ?LABS: Labs reviewed: Acceptable for surgery. ?(all labs ordered are listed, but only abnormal results are displayed) ? ?Labs Reviewed  ?BASIC METABOLIC PANEL - Abnormal; Notable for the following components:  ?    Result Value  ? Glucose, Bld 130 (*)   ? All other components within normal limits  ?CBC - Abnormal; Notable for the following components:  ? Hemoglobin 12.1 (*)   ? HCT 38.5 (*)   ? RDW 15.9 (*)   ? All other components within normal limits  ?GLUCOSE, CAPILLARY - Abnormal; Notable for the following components:  ? Glucose-Capillary 106 (*)   ? All other components within normal limits  ? ? ? ?IMAGES: ? ? ?EKG: ?10/16/2020 ?Rate 100 bpm  ?Sinus tachycardha ?Borderline left axis deviation ?Borderline T wave abnormalities ? ?CV: ?Echo 10/16/2020 ? 1. Left ventricular ejection  fraction, by estimation, is 55 to 60%. The  ?left ventricle has normal function. The left ventricle has no regional  ?wall motion abnormalities. There is moderate asymmetric left ventricular  ?hypertrophy of the basal-septal  ?segment.  ? 2. Right ventricular systolic function is normal. The right ventricular  ?size is normal.  ? 3. The inferior vena cava is dilated in size with >50% respiratory  ?variability, suggesting right atrial pressure of 8 mmHg.  ? ?Cardiac Cath 05/30/2020 ?Ost LM to Dist LM lesion is 90% stenosed. ?Mid LAD lesion is 90% stenosed. ?Prox Cx lesion is 50% stenosed. ?1st Mrg lesion is 70% stenosed. ?Ost RCA to Prox RCA lesion is 50% stenosed. ?Mid RCA lesion is 70% stenosed. ?Dist RCA lesion is 70% stenosed. ?LV end diastolic pressure is normal. ?  ?1. Severe left main and 3 vessel obstructive CAD ?2. Normal LVEDP ?  ?Plan: Echo to assess LV. Recommend CT surgery consultation for CABG.  ?Past Medical History:  ?Diagnosis Date  ? Anemia   ? Aspiration pneumonia ([place])   ? CAD (coronary artery disease)   ? a. s/p CABG 05/2020.  ? Cancer [facility])   ? esophageal cancer 1995  ? Carotid artery disease ([place])   ? Chronic kidney disease, stage 3a ([place])   ? COPD (chronic obstructive pulmonary disease) ([place])   ? GERD (gastroesophageal reflux disease)   ? History of  kidney stones   ? Hyperlipidemia   ? Hypertension   ? Joint pain   ? Lung nodule   ? Osteomyelitis ([place])   ? Osteoporosis   ? 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 LIMA to LAD, SVG to OM, SVG to RCA  ? ? ?Past Surgical History:  ?Procedure Laterality Date  ? COLONOSCOPY WITH ESOPHAGOGASTRODUODENOSCOPY (EGD)    ? CORONARY ARTERY BYPASS GRAFT N/A 06/02/2020  ? Procedure: CORONARY ARTERY BYPASS GRAFTING (CABG) TIMES THREE USING RIGHT GREATER SAPHENOUS VEIN HARVESTED ENDOSCOPICALLY AND LEFT INTERNAL MAMMARY ARTERY.;  Surgeon: Rexene Alberts, MD;  Location: [place];  Service: Open Heart Surgery;  Laterality: N/A;  ? CYSTOSCOPY/RETROGRADE/URETEROSCOPY/STONE  EXTRACTION WITH BASKET    ? ESOPHAGECTOMY    ? Ivor-Lewis esophagectomy 1995 [facility]  ? IR THORACENTESIS ASP PLEURAL SPACE W/IMG GUIDE  06/06/2020  ? LEFT HEART CATH AND CORONARY ANGIOGRAPHY N/A 05/30/2020  ? Procedure: LEFT HEART CATH AND CORONARY ANGIOGRAPHY;  Surgeon: Martinique, Peter M, MD;  Location: [place] [facility];  Service: Cardiovascular;  Laterality: N/A;  ? partial esophageal ejectory    ? SHOULDER OPEN ROTATOR CUFF REPAIR Left 10/26/2019  ? Procedure: ROTATOR CUFF REPAIR SHOULDER OPEN LEFT;  Surgeon: [name], MD;  Location[facility];  Service: Orthopedics;  Laterality: Left;  ? ? ?MEDICATIONS: ? albuterol (VENTOLIN HFA) 108 (90 Base) MCG/ACT inhaler  ? aspirin EC 81 MG EC tablet  ? B Complex Vitamins (VITAMIN B COMPLEX PO)  ? cholecalciferol (VITAMIN D3) 25 MCG (1000 UNIT) tablet  ? clopidogrel (PLAVIX) 75 MG tablet  ? diclofenac sodium (VOLTAREN) 1 % GEL  ? esomeprazole (NEXIUM) 40 MG capsule  ? ezetimibe (ZETIA) 10 MG tablet  ? glipiZIDE (GLUCOTROL XL) 2.5 MG 24 hr tablet  ? lisinopril (ZESTRIL) 2.5 MG tablet  ? metoprolol tartrate (LOPRESSOR) 100 MG tablet  ? rosuvastatin (CRESTOR) 40 MG tablet  ? tamsulosin (FLOMAX) 0.4 MG CAPS capsule  ? ?No current facility-administered medications for this encounter.  ? ? ?Konrad Felix Ward, PA-C ?[facility] Pre-Surgical Testing ?[phone] ? ? ? ? ? ? ?

## 2021-05-04 NOTE — Progress Notes (Signed)
A1C did not result from lab. Put in order for DOS. Patient is pre diabetic. ?

## 2021-05-11 ENCOUNTER — Encounter (HOSPITAL_COMMUNITY): Admission: RE | Payer: Self-pay | Source: Home / Self Care

## 2021-05-11 ENCOUNTER — Ambulatory Visit (HOSPITAL_COMMUNITY): Admission: RE | Admit: 2021-05-11 | Payer: Medicare Other | Source: Home / Self Care | Admitting: Orthopedic Surgery

## 2021-05-11 ENCOUNTER — Encounter: Payer: Self-pay | Admitting: Vascular Surgery

## 2021-05-11 SURGERY — ARTHROSCOPY, SHOULDER, WITH ROTATOR CUFF REPAIR
Anesthesia: Choice | Laterality: Left

## 2021-05-11 NOTE — Progress Notes (Signed)
Patient aware that surgery for today 05/11/2021 is cancelled per Dr Christ Kick. Patient aware anesthesia will contact pt and phone number provided.  ?

## 2021-05-24 ENCOUNTER — Other Ambulatory Visit: Payer: Self-pay | Admitting: Cardiology

## 2021-05-31 ENCOUNTER — Other Ambulatory Visit: Payer: Self-pay | Admitting: Physician Assistant

## 2021-06-06 ENCOUNTER — Encounter: Payer: Self-pay | Admitting: Vascular Surgery

## 2021-06-06 ENCOUNTER — Ambulatory Visit (INDEPENDENT_AMBULATORY_CARE_PROVIDER_SITE_OTHER): Payer: Medicare Other

## 2021-06-06 ENCOUNTER — Other Ambulatory Visit (HOSPITAL_COMMUNITY): Payer: Self-pay | Admitting: Vascular Surgery

## 2021-06-06 ENCOUNTER — Ambulatory Visit (INDEPENDENT_AMBULATORY_CARE_PROVIDER_SITE_OTHER): Payer: Medicare Other | Admitting: Vascular Surgery

## 2021-06-06 VITALS — BP 114/74 | HR 64 | Temp 98.4°F | Resp 16 | Ht 65.0 in | Wt 163.0 lb

## 2021-06-06 DIAGNOSIS — I779 Disorder of arteries and arterioles, unspecified: Secondary | ICD-10-CM

## 2021-06-06 DIAGNOSIS — I6523 Occlusion and stenosis of bilateral carotid arteries: Secondary | ICD-10-CM

## 2021-06-06 DIAGNOSIS — I6521 Occlusion and stenosis of right carotid artery: Secondary | ICD-10-CM | POA: Diagnosis not present

## 2021-06-06 NOTE — Progress Notes (Signed)
Vascular and Vein Specialist of [place]  Patient name: James Wolf MRN: [id number] DOB: [birth year] Sex: male  REASON FOR VISIT: Continued follow-up of right carotid stenosis  HPI: James Wolf is a 71 y.o. male seen today for continued follow-up of right carotid disease.  He was found to have significant carotid disease at the time of coronary artery bypass grafting in May 2022.  I saw him in the office and he underwent CT angiogram in December 2022.  He remained asymptomatic.  Radiologist interpretation was 70% stenosis of his right internal carotid artery.  It appeared that he had at least this degree of narrowing and on axial images look like it may be significantly more than this.  We discussed options and elected to follow-up in 6 months.  He remains asymptomatic.  His main complaint is of left shoulder difficulty and he is scheduled for shoulder surgery.  He is being reevaluated prior to shoulder surgery for clearance.  Past Medical History:  Diagnosis Date   Anemia    Aspiration pneumonia (HCC)    CAD (coronary artery disease)    a. s/p CABG 05/2020.   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     Current Outpatient Medications  Medication Sig Dispense Refill   albuterol (VENTOLIN HFA) 108 (90 Base) MCG/ACT inhaler Inhale 1-2 puffs into the lungs every 6 (six) hours as needed for wheezing or shortness of breath.     aspirin EC 81 MG EC tablet Take 1 tablet (81 mg total) by mouth daily. Swallow whole. 30 tablet 11   B Complex Vitamins (VITAMIN B COMPLEX PO) Take 1 tablet by mouth 2 (two) times daily.     cholecalciferol (VITAMIN D3) 25 MCG (1000 UNIT) tablet Take 1,000 Units by mouth 2 (two) times daily.     diclofenac sodium (VOLTAREN) 1 % GEL Apply 2 g topically 2 (two) times daily as needed (pain).      esomeprazole (NEXIUM) 40 MG capsule Take 40 mg by mouth 2 (two) times daily.      ezetimibe (ZETIA) 10 MG tablet TAKE 1 TABLET BY MOUTH EVERY DAY 90 tablet 1   glipiZIDE (GLUCOTROL XL) 2.5 MG 24 hr tablet Take 2.5 mg by mouth daily with breakfast.     lisinopril (ZESTRIL) 2.5  MG tablet Take 2.5 mg by mouth daily.     metoprolol tartrate (LOPRESSOR) 100 MG tablet Take 0.5 tablets (50 mg total) by mouth 2 (two) times daily. 90 tablet 1   rosuvastatin (CRESTOR) 40 MG tablet Take 1 tablet (40 mg total) by mouth daily at 6 PM. 90 tablet 1   tamsulosin (FLOMAX) 0.4 MG CAPS capsule Take 0.4 mg by mouth every other day.     No current facility-administered medications for this visit.    REVIEW OF SYSTEMS:  '[X]'$  denotes positive finding, '[ ]'$  denotes negative finding Cardiac  Comments:  Chest pain or chest pressure:    Shortness of breath upon exertion:    Short of breath when lying flat:    Irregular heart rhythm:        Vascular    Pain in calf, thigh, or hip brought on by ambulation:    Pain in feet at night that wakes you up from your sleep:     Blood clot in your veins:    Leg swelling:           PHYSICAL  EXAM: Vitals:   06/06/21 1330 06/06/21 1331  BP: 114/74 114/74  Pulse: 64   Resp: 16   Temp: 98.4 F (36.9 C)   TempSrc: Temporal   SpO2: 97%   Weight: 163 lb (73.9 kg)   Height: '5\' 5"'$  (1.651 m)     GENERAL: The patient is a well-nourished male, in no acute distress. The vital signs are documented above. CARDIOVASCULAR: I do not hear bruits in his carotids bilaterally.  2+ radial pulses bilaterally. PULMONARY: There is good air exchange  MUSCULOSKELETAL: There are no major deformities or cyanosis. NEUROLOGIC: No focal weakness or paresthesias are detected. SKIN: There are no ulcers or rashes noted. PSYCHIATRIC: The patient has a normal affect.  DATA:  Carotid duplex today suggest progression and continued critical narrowing in his right internal carotid artery.  End-diastolic velocities are 268 cm/s.  Left internal carotid artery velocities suggest 40 to 59% stenosis MEDICAL ISSUES: I reviewed his CT scan from December.  He does have focal area of stenosis in the bifurcation.  His the bifurcation is relatively no low in the neck with no extension into the upper internal carotid artery.  I had a long discussion with the patient and his wife present.  I have recommended endarterectomy for reduction of stroke risk.  I explained the procedure in detail with an expected overnight stay at the hospital.  I did discuss potential for cranial nerve injury and also 1 to 1-1/2% risk of stroke with surgery.  He does have a history of distal esophageal resection for cancer.  He has difficulty with reflux.  He has never had neck surgery.  We will schedule him for surgery as soon as possible.  He would like to have this taken care of quickly so he can proceed with left shoulder surgery.  He understands that one of my partners will be doing the surgery at [facility] in [place].    Rosetta Posner, MD FACS [facility] [phone]  Note:  Portions of this report may have been transcribed using voice recognition software.  Every effort has been made to ensure accuracy; however, inadvertent computerized transcription errors may still be present.

## 2021-06-06 NOTE — H&P (View-Only) (Signed)
Vascular and Vein Specialist of [place]  Patient name: James Wolf MRN: [id number] DOB: 13-Dec-1950 Sex: male  REASON FOR VISIT: Continued follow-up of right carotid stenosis  HPI: James Wolf is a 71 y.o. male seen today for continued follow-up of right carotid disease.  He was found to have significant carotid disease at the time of coronary artery bypass grafting in May 2022.  I saw him in the office and he underwent CT angiogram in December 2022.  He remained asymptomatic.  Radiologist interpretation was 70% stenosis of his right internal carotid artery.  It appeared that he had at least this degree of narrowing and on axial images look like it may be significantly more than this.  We discussed options and elected to follow-up in 6 months.  He remains asymptomatic.  His main complaint is of left shoulder difficulty and he is scheduled for shoulder surgery.  He is being reevaluated prior to shoulder surgery for clearance.  Past Medical History:  Diagnosis Date   Anemia    Aspiration pneumonia (H[facility])    CAD (coronary artery disease)    a. s/p CABG 05/2020.   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     Current Outpatient Medications  Medication Sig Dispense Refill   albuterol (VENTOLIN HFA) 108 (90 Base) MCG/ACT inhaler Inhale 1-2 puffs into the lungs every 6 (six) hours as needed for wheezing or shortness of breath.     aspirin EC 81 MG EC tablet Take 1 tablet (81 mg total) by mouth daily. Swallow whole. 30 tablet 11   B Complex Vitamins (VITAMIN B COMPLEX PO) Take 1 tablet by mouth 2 (two) times daily.     cholecalciferol (VITAMIN D3) 25 MCG (1000 UNIT) tablet Take 1,000 Units by mouth 2 (two) times daily.     diclofenac sodium (VOLTAREN) 1 % GEL Apply 2 g topically 2 (two) times daily as needed (pain).      esomeprazole (NEXIUM) 40 MG capsule Take 40 mg by mouth 2 (two) times daily.      ezetimibe (ZETIA) 10 MG tablet TAKE 1 TABLET BY MOUTH EVERY DAY 90 tablet 1   glipiZIDE (GLUCOTROL XL) 2.5 MG 24 hr tablet Take 2.5 mg by mouth daily with breakfast.     lisinopril (ZESTRIL) 2.5  MG tablet Take 2.5 mg by mouth daily.     metoprolol tartrate (LOPRESSOR) 100 MG tablet Take 0.5 tablets (50 mg total) by mouth 2 (two) times daily. 90 tablet 1   rosuvastatin (CRESTOR) 40 MG tablet Take 1 tablet (40 mg total) by mouth daily at 6 PM. 90 tablet 1   tamsulosin (FLOMAX) 0.4 MG CAPS capsule Take 0.4 mg by mouth every other day.     No current facility-administered medications for this visit.    REVIEW OF SYSTEMS:  '[X]'$  denotes positive finding, '[ ]'$  denotes negative finding Cardiac  Comments:  Chest pain or chest pressure:    Shortness of breath upon exertion:    Short of breath when lying flat:    Irregular heart rhythm:        Vascular    Pain in calf, thigh, or hip brought on by ambulation:    Pain in feet at night that wakes you up from your sleep:     Blood clot in your veins:    Leg swelling:           PHYSICAL  EXAM: Vitals:   06/06/21 1330 06/06/21 1331  BP: 114/74 114/74  Pulse: 64   Resp: 16   Temp: 98.4 F (36.9 C)   TempSrc: Temporal   SpO2: 97%   Weight: 163 lb (73.9 kg)   Height: '5\' 5"'$  (1.651 m)     GENERAL: The patient is a well-nourished male, in no acute distress. The vital signs are documented above. CARDIOVASCULAR: I do not hear bruits in his carotids bilaterally.  2+ radial pulses bilaterally. PULMONARY: There is good air exchange  MUSCULOSKELETAL: There are no major deformities or cyanosis. NEUROLOGIC: No focal weakness or paresthesias are detected. SKIN: There are no ulcers or rashes noted. PSYCHIATRIC: The patient has a normal affect.  DATA:  Carotid duplex today suggest progression and continued critical narrowing in his right internal carotid artery.  End-diastolic velocities are 875 cm/s.  Left internal carotid artery velocities suggest 40 to 59% stenosis MEDICAL ISSUES: I reviewed his CT scan from December.  He does have focal area of stenosis in the bifurcation.  His the bifurcation is relatively no low in the neck with no extension into the upper internal carotid artery.  I had a long discussion with the patient and his wife present.  I have recommended endarterectomy for reduction of stroke risk.  I explained the procedure in detail with an expected overnight stay at the hospital.  I did discuss potential for cranial nerve injury and also 1 to 1-1/2% risk of stroke with surgery.  He does have a history of distal esophageal resection for cancer.  He has difficulty with reflux.  He has never had neck surgery.  We will schedule him for surgery as soon as possible.  He would like to have this taken care of quickly so he can proceed with left shoulder surgery.  He understands that one of my partners will be doing the surgery at [facility] in [place].    Rosetta Posner, MD FACS [facility] Specialists of [facility] [phone]  Note:  Portions of this report may have been transcribed using voice recognition software.  Every effort has been made to ensure accuracy; however, inadvertent computerized transcription errors may still be present.

## 2021-06-07 ENCOUNTER — Other Ambulatory Visit: Payer: Self-pay

## 2021-06-07 DIAGNOSIS — I6521 Occlusion and stenosis of right carotid artery: Secondary | ICD-10-CM

## 2021-06-11 ENCOUNTER — Encounter (HOSPITAL_COMMUNITY): Payer: Self-pay | Admitting: Vascular Surgery

## 2021-06-11 ENCOUNTER — Other Ambulatory Visit: Payer: Self-pay

## 2021-06-11 NOTE — Progress Notes (Signed)
Pt aware of time change for surgery. Arrival time 1140.

## 2021-06-11 NOTE — Progress Notes (Addendum)
Mr. Varma denies chest pain or shortness of breath. Patient denies having any s/s of Covid in his household.  Patient denies any known exposure to Covid.   Mr. Reichart Drs.: PCP- Dr. Keith Rake, Cardiologist is Dr. Zandra Abts,  Nephrologist- saw April Cassidy, FNP-C,  Urologist-  in [place], [place].  Mr. Cork has a history of kidney stones, patient takes Flomax to assist in passing them.  Patient has a kidney stone at this time.  Mr. Colin has pre-- diabetes, Patient takes Glipizide. I instructed patient to not take Glipizibe in am.  Mr. Waltman has a glucose monitor, he only checks CBG when he feels like it is low.  I asked patient to check CBG when he gets upin am and every 2 hours until he leaves to come to the hospital.  I instructed patient if CBG is less than 70 to take 4 Glucose Tablets or 1 tube of Glucose Gel or 1/2 cup of a clear juice. Recheck CBG in 15 minutes if CBG is not over 70 call, pre- op desk at [phone] for further instructions.

## 2021-06-12 ENCOUNTER — Other Ambulatory Visit: Payer: Self-pay

## 2021-06-12 ENCOUNTER — Inpatient Hospital Stay (HOSPITAL_COMMUNITY): Payer: Medicare Other | Admitting: Anesthesiology

## 2021-06-12 ENCOUNTER — Encounter (HOSPITAL_COMMUNITY): Payer: Self-pay | Admitting: Vascular Surgery

## 2021-06-12 ENCOUNTER — Inpatient Hospital Stay (HOSPITAL_COMMUNITY)
Admission: RE | Admit: 2021-06-12 | Discharge: 2021-06-15 | DRG: 039 | Disposition: A | Discharge status: Home Health | Payer: Medicare Other | Attending: Vascular Surgery | Admitting: Vascular Surgery

## 2021-06-12 ENCOUNTER — Encounter (HOSPITAL_COMMUNITY)
Admission: RE | Disposition: A | Discharge status: Home Health | Payer: Self-pay | Source: Home / Self Care | Attending: Vascular Surgery

## 2021-06-12 DIAGNOSIS — I252 Old myocardial infarction: Secondary | ICD-10-CM | POA: Diagnosis not present

## 2021-06-12 DIAGNOSIS — Z8501 Personal history of malignant neoplasm of esophagus: Secondary | ICD-10-CM

## 2021-06-12 DIAGNOSIS — E785 Hyperlipidemia, unspecified: Secondary | ICD-10-CM | POA: Diagnosis present

## 2021-06-12 DIAGNOSIS — Z951 Presence of aortocoronary bypass graft: Secondary | ICD-10-CM | POA: Diagnosis not present

## 2021-06-12 DIAGNOSIS — Z888 Allergy status to other drugs, medicaments and biological substances status: Secondary | ICD-10-CM | POA: Diagnosis not present

## 2021-06-12 DIAGNOSIS — J449 Chronic obstructive pulmonary disease, unspecified: Secondary | ICD-10-CM | POA: Diagnosis present

## 2021-06-12 DIAGNOSIS — N1831 Chronic kidney disease, stage 3a: Secondary | ICD-10-CM | POA: Diagnosis present

## 2021-06-12 DIAGNOSIS — F909 Attention-deficit hyperactivity disorder, unspecified type: Secondary | ICD-10-CM | POA: Diagnosis present

## 2021-06-12 DIAGNOSIS — Z87891 Personal history of nicotine dependence: Secondary | ICD-10-CM

## 2021-06-12 DIAGNOSIS — K219 Gastro-esophageal reflux disease without esophagitis: Secondary | ICD-10-CM | POA: Diagnosis present

## 2021-06-12 DIAGNOSIS — I10 Essential (primary) hypertension: Secondary | ICD-10-CM | POA: Diagnosis not present

## 2021-06-12 DIAGNOSIS — I129 Hypertensive chronic kidney disease with stage 1 through stage 4 chronic kidney disease, or unspecified chronic kidney disease: Secondary | ICD-10-CM | POA: Diagnosis present

## 2021-06-12 DIAGNOSIS — Z8249 Family history of ischemic heart disease and other diseases of the circulatory system: Secondary | ICD-10-CM | POA: Diagnosis not present

## 2021-06-12 DIAGNOSIS — Z79899 Other long term (current) drug therapy: Secondary | ICD-10-CM

## 2021-06-12 DIAGNOSIS — I2510 Atherosclerotic heart disease of native coronary artery without angina pectoris: Secondary | ICD-10-CM

## 2021-06-12 DIAGNOSIS — I6521 Occlusion and stenosis of right carotid artery: Secondary | ICD-10-CM

## 2021-06-12 DIAGNOSIS — Z823 Family history of stroke: Secondary | ICD-10-CM | POA: Diagnosis not present

## 2021-06-12 DIAGNOSIS — Z7982 Long term (current) use of aspirin: Secondary | ICD-10-CM | POA: Diagnosis not present

## 2021-06-12 DIAGNOSIS — R7303 Prediabetes: Secondary | ICD-10-CM | POA: Diagnosis present

## 2021-06-12 DIAGNOSIS — I6529 Occlusion and stenosis of unspecified carotid artery: Secondary | ICD-10-CM | POA: Diagnosis present

## 2021-06-12 HISTORY — DX: Personal history of other diseases of the digestive system: Z87.19

## 2021-06-12 HISTORY — DX: Prediabetes: R73.03

## 2021-06-12 HISTORY — DX: Acute myocardial infarction, unspecified: I21.9

## 2021-06-12 HISTORY — DX: Attention-deficit hyperactivity disorder, unspecified type: F90.9

## 2021-06-12 HISTORY — DX: Unspecified asthma, uncomplicated: J45.909

## 2021-06-12 HISTORY — PX: ENDARTERECTOMY: SHX5162

## 2021-06-12 LAB — COMPREHENSIVE METABOLIC PANEL
ALT: 25 U/L (ref 0–44)
AST: 29 U/L (ref 15–41)
Albumin: 3.4 g/dL — ABNORMAL LOW (ref 3.5–5.0)
Alkaline Phosphatase: 36 U/L — ABNORMAL LOW (ref 38–126)
Anion gap: 9 (ref 5–15)
BUN: 17 mg/dL (ref 8–23)
CO2: 23 mmol/L (ref 22–32)
Calcium: 8.8 mg/dL — ABNORMAL LOW (ref 8.9–10.3)
Chloride: 108 mmol/L (ref 98–111)
Creatinine, Ser: 1.36 mg/dL — ABNORMAL HIGH (ref 0.61–1.24)
GFR, Estimated: 56 mL/min — ABNORMAL LOW (ref >60)
Glucose, Bld: 90 mg/dL (ref 70–99)
Potassium: 4.2 mmol/L (ref 3.5–5.1)
Sodium: 140 mmol/L (ref 135–145)
Total Bilirubin: 0.4 mg/dL (ref 0.3–1.2)
Total Protein: 6.7 g/dL (ref 6.5–8.1)

## 2021-06-12 LAB — CBC
HCT: 37.5 % — ABNORMAL LOW (ref 39.0–52.0)
Hemoglobin: 11.5 g/dL — ABNORMAL LOW (ref 13.0–17.0)
MCH: 28 pg (ref 26.0–34.0)
MCHC: 30.7 g/dL (ref 30.0–36.0)
MCV: 91.2 fL (ref 80.0–100.0)
Platelets: 163 10*3/uL (ref 150–400)
RBC: 4.11 MIL/uL — ABNORMAL LOW (ref 4.22–5.81)
RDW: 15.3 % (ref 11.5–15.5)
WBC: 7.2 10*3/uL (ref 4.0–10.5)
nRBC: 0 % (ref 0.0–0.2)

## 2021-06-12 LAB — SURGICAL PCR SCREEN
MRSA, PCR: NEGATIVE
Staphylococcus aureus: NEGATIVE

## 2021-06-12 LAB — PROTIME-INR
INR: 1 (ref 0.8–1.2)
Prothrombin Time: 13.4 seconds (ref 11.4–15.2)

## 2021-06-12 LAB — URINALYSIS, ROUTINE W REFLEX MICROSCOPIC
Bilirubin Urine: NEGATIVE
Glucose, UA: NEGATIVE mg/dL
Ketones, ur: 5 mg/dL — AB
Nitrite: NEGATIVE
Protein, ur: 100 mg/dL — AB
RBC / HPF: >50 RBC/hpf — ABNORMAL HIGH (ref 0–5)
Specific Gravity, Urine: 1.029 (ref 1.005–1.030)
pH: 5 (ref 5.0–8.0)

## 2021-06-12 LAB — TYPE AND SCREEN
ABO/RH(D): B POS
Antibody Screen: NEGATIVE

## 2021-06-12 LAB — POCT ACTIVATED CLOTTING TIME
Activated Clotting Time: 293 seconds
Activated Clotting Time: 251 seconds

## 2021-06-12 LAB — GLUCOSE, CAPILLARY
Glucose-Capillary: 111 mg/dL — ABNORMAL HIGH (ref 70–99)
Glucose-Capillary: 121 mg/dL — ABNORMAL HIGH (ref 70–99)

## 2021-06-12 LAB — APTT: aPTT: 27 seconds (ref 24–36)

## 2021-06-12 SURGERY — ENDARTERECTOMY, CAROTID
Anesthesia: General | Site: Neck | Laterality: Right

## 2021-06-12 MED ORDER — HEPARIN SODIUM (PORCINE) 1000 UNIT/ML IJ SOLN
INTRAMUSCULAR | Status: DC | PRN
  Administered 2021-06-12: 8000 [IU] via INTRAVENOUS

## 2021-06-12 MED ORDER — EPHEDRINE SULFATE-NACL 50-0.9 MG/10ML-% IV SOSY
PREFILLED_SYRINGE | INTRAVENOUS | Status: DC | PRN
  Administered 2021-06-12: 10 mg via INTRAVENOUS
  Administered 2021-06-12: 5 mg via INTRAVENOUS

## 2021-06-12 MED ORDER — AMISULPRIDE (ANTIEMETIC) 5 MG/2ML IV SOLN
5.0000 mg | Freq: Once | INTRAVENOUS | Status: AC
Start: 2021-06-12 — End: 2021-06-12
  Administered 2021-06-12: 5 mg via INTRAVENOUS

## 2021-06-12 MED ORDER — PHENYLEPHRINE HCL-NACL 20-0.9 MG/250ML-% IV SOLN
INTRAVENOUS | Status: DC | PRN
  Administered 2021-06-12: 25 ug/min via INTRAVENOUS

## 2021-06-12 MED ORDER — DEXAMETHASONE SODIUM PHOSPHATE 10 MG/ML IJ SOLN
INTRAMUSCULAR | Status: DC | PRN
  Administered 2021-06-12: 4 mg via INTRAVENOUS

## 2021-06-12 MED ORDER — HEPARIN 6000 UNIT IRRIGATION SOLUTION
Status: DC | PRN
  Administered 2021-06-12: 1

## 2021-06-12 MED ORDER — ALBUTEROL SULFATE (2.5 MG/3ML) 0.083% IN NEBU
2.5000 mg | INHALATION_SOLUTION | Freq: Once | RESPIRATORY_TRACT | Status: AC
  Administered 2021-06-12: 2.5 mg via RESPIRATORY_TRACT

## 2021-06-12 MED ORDER — HYDROMORPHONE HCL 1 MG/ML IJ SOLN
INTRAMUSCULAR | Status: AC
  Filled 2021-06-12: qty 1

## 2021-06-12 MED ORDER — ALUM & MAG HYDROXIDE-SIMETH 200-200-20 MG/5ML PO SUSP: 15.0000 mL | ORAL | Status: DC | PRN

## 2021-06-12 MED ORDER — IPRATROPIUM BROMIDE 0.02 % IN SOLN
RESPIRATORY_TRACT | Status: AC
  Filled 2021-06-12: qty 2.5

## 2021-06-12 MED ORDER — PROTAMINE SULFATE 10 MG/ML IV SOLN
INTRAVENOUS | Status: AC
  Filled 2021-06-12: qty 5

## 2021-06-12 MED ORDER — POTASSIUM CHLORIDE CRYS ER 20 MEQ PO TBCR: 20.0000 meq | EXTENDED_RELEASE_TABLET | Freq: Every day | ORAL | Status: DC | PRN

## 2021-06-12 MED ORDER — ROCURONIUM BROMIDE 10 MG/ML (PF) SYRINGE
PREFILLED_SYRINGE | INTRAVENOUS | Status: AC
  Filled 2021-06-12: qty 10

## 2021-06-12 MED ORDER — CEFAZOLIN SODIUM-DEXTROSE 2-4 GM/100ML-% IV SOLN
2.0000 g | Freq: Three times a day (TID) | INTRAVENOUS | Status: AC
  Administered 2021-06-12 – 2021-06-13 (×2): 2 g via INTRAVENOUS
  Filled 2021-06-12 (×2): qty 100

## 2021-06-12 MED ORDER — HEPARIN SODIUM (PORCINE) 1000 UNIT/ML IJ SOLN
INTRAMUSCULAR | Status: AC
  Filled 2021-06-12: qty 30

## 2021-06-12 MED ORDER — TAMSULOSIN HCL 0.4 MG PO CAPS
0.4000 mg | ORAL_CAPSULE | Freq: Every day | ORAL | Status: DC
  Administered 2021-06-13 – 2021-06-15 (×3): 0.4 mg via ORAL
  Filled 2021-06-12 (×3): qty 1

## 2021-06-12 MED ORDER — MIDAZOLAM HCL 2 MG/2ML IJ SOLN
INTRAMUSCULAR | Status: DC | PRN
  Administered 2021-06-12: 2 mg via INTRAVENOUS

## 2021-06-12 MED ORDER — HYDROMORPHONE HCL 1 MG/ML IJ SOLN
0.2500 mg | INTRAMUSCULAR | Status: DC | PRN
  Administered 2021-06-12: 0.25 mg via INTRAVENOUS

## 2021-06-12 MED ORDER — CHLORHEXIDINE GLUCONATE 0.12 % MT SOLN
15.0000 mL | Freq: Once | OROMUCOSAL | Status: AC
  Administered 2021-06-12: 15 mL via OROMUCOSAL
  Filled 2021-06-12: qty 15

## 2021-06-12 MED ORDER — SODIUM CHLORIDE (PF) 0.9 % IJ SOLN
INTRAMUSCULAR | Status: AC
  Filled 2021-06-12: qty 20

## 2021-06-12 MED ORDER — 0.9 % SODIUM CHLORIDE (POUR BTL) OPTIME
TOPICAL | Status: DC | PRN
  Administered 2021-06-12: 2000 mL

## 2021-06-12 MED ORDER — PANTOPRAZOLE SODIUM 40 MG PO TBEC
40.0000 mg | DELAYED_RELEASE_TABLET | Freq: Every day | ORAL | Status: DC
  Administered 2021-06-12 – 2021-06-13 (×2): 40 mg via ORAL
  Filled 2021-06-12 (×2): qty 1

## 2021-06-12 MED ORDER — ROSUVASTATIN CALCIUM 20 MG PO TABS
40.0000 mg | ORAL_TABLET | Freq: Every day | ORAL | Status: DC
  Administered 2021-06-13 – 2021-06-14 (×2): 40 mg via ORAL
  Filled 2021-06-12 (×3): qty 2

## 2021-06-12 MED ORDER — ORAL CARE MOUTH RINSE: 15.0000 mL | Freq: Once | OROMUCOSAL | Status: AC

## 2021-06-12 MED ORDER — LIDOCAINE 2% (20 MG/ML) 5 ML SYRINGE
INTRAMUSCULAR | Status: DC | PRN
  Administered 2021-06-12: 60 mg via INTRAVENOUS

## 2021-06-12 MED ORDER — IPRATROPIUM BROMIDE 0.02 % IN SOLN
0.5000 mg | Freq: Once | RESPIRATORY_TRACT | Status: AC
  Administered 2021-06-12: 0.5 mg via RESPIRATORY_TRACT

## 2021-06-12 MED ORDER — METOPROLOL TARTRATE 5 MG/5ML IV SOLN: 2.0000 mg | INTRAVENOUS | Status: DC | PRN

## 2021-06-12 MED ORDER — ACETAMINOPHEN 500 MG PO TABS
ORAL_TABLET | ORAL | Status: AC
  Administered 2021-06-12: 1000 mg via ORAL
  Filled 2021-06-12: qty 2

## 2021-06-12 MED ORDER — SUGAMMADEX SODIUM 200 MG/2ML IV SOLN
INTRAVENOUS | Status: DC | PRN
  Administered 2021-06-12: 200 mg via INTRAVENOUS

## 2021-06-12 MED ORDER — LIDOCAINE 2% (20 MG/ML) 5 ML SYRINGE
INTRAMUSCULAR | Status: AC
  Filled 2021-06-12: qty 5

## 2021-06-12 MED ORDER — ACETAMINOPHEN 325 MG PO TABS
325.0000 mg | ORAL_TABLET | ORAL | Status: DC | PRN
  Administered 2021-06-13 – 2021-06-14 (×2): 650 mg via ORAL
  Filled 2021-06-12 (×2): qty 2

## 2021-06-12 MED ORDER — SODIUM CHLORIDE 0.9 % IV SOLN: INTRAVENOUS | Status: DC

## 2021-06-12 MED ORDER — CHLORHEXIDINE GLUCONATE CLOTH 2 % EX PADS: 6.0000 | MEDICATED_PAD | Freq: Once | CUTANEOUS | Status: DC

## 2021-06-12 MED ORDER — PROTAMINE SULFATE 10 MG/ML IV SOLN
INTRAVENOUS | Status: DC | PRN
  Administered 2021-06-12: 50 mg via INTRAVENOUS

## 2021-06-12 MED ORDER — PHENYLEPHRINE 80 MCG/ML (10ML) SYRINGE FOR IV PUSH (FOR BLOOD PRESSURE SUPPORT)
PREFILLED_SYRINGE | INTRAVENOUS | Status: DC | PRN
  Administered 2021-06-12 (×5): 80 ug via INTRAVENOUS

## 2021-06-12 MED ORDER — SUFENTANIL CITRATE 50 MCG/ML IV SOLN
INTRAVENOUS | Status: AC
  Filled 2021-06-12: qty 1

## 2021-06-12 MED ORDER — MORPHINE SULFATE (PF) 2 MG/ML IV SOLN
2.0000 mg | INTRAVENOUS | Status: DC | PRN
  Administered 2021-06-13: 2 mg via INTRAVENOUS
  Filled 2021-06-12: qty 1

## 2021-06-12 MED ORDER — EPHEDRINE 5 MG/ML INJ
INTRAVENOUS | Status: AC
Start: 2021-06-12 — End: ?
  Filled 2021-06-12: qty 5

## 2021-06-12 MED ORDER — DOCUSATE SODIUM 100 MG PO CAPS
100.0000 mg | ORAL_CAPSULE | Freq: Every day | ORAL | Status: DC
Start: 2021-06-13 — End: 2021-06-15
  Administered 2021-06-13 – 2021-06-15 (×3): 100 mg via ORAL
  Filled 2021-06-12 (×3): qty 1

## 2021-06-12 MED ORDER — EZETIMIBE 10 MG PO TABS
10.0000 mg | ORAL_TABLET | Freq: Every day | ORAL | Status: DC
  Administered 2021-06-13 – 2021-06-15 (×3): 10 mg via ORAL
  Filled 2021-06-12 (×3): qty 1

## 2021-06-12 MED ORDER — CEFAZOLIN SODIUM-DEXTROSE 2-4 GM/100ML-% IV SOLN
2.0000 g | INTRAVENOUS | Status: AC
  Administered 2021-06-12: 2 g via INTRAVENOUS
  Filled 2021-06-12: qty 100

## 2021-06-12 MED ORDER — PHENOL 1.4 % MT LIQD
1.0000 | OROMUCOSAL | Status: DC | PRN
  Administered 2021-06-14: 1 via OROMUCOSAL
  Filled 2021-06-12: qty 177

## 2021-06-12 MED ORDER — ROCURONIUM BROMIDE 10 MG/ML (PF) SYRINGE
PREFILLED_SYRINGE | INTRAVENOUS | Status: DC | PRN
  Administered 2021-06-12: 60 mg via INTRAVENOUS
  Administered 2021-06-12: 10 mg via INTRAVENOUS

## 2021-06-12 MED ORDER — IPRATROPIUM BROMIDE 0.02 % IN SOLN: 0.5000 mg | RESPIRATORY_TRACT | Status: DC

## 2021-06-12 MED ORDER — ASPIRIN 81 MG PO TBEC
81.0000 mg | DELAYED_RELEASE_TABLET | Freq: Every day | ORAL | Status: DC
  Administered 2021-06-13 – 2021-06-15 (×3): 81 mg via ORAL
  Filled 2021-06-12 (×3): qty 1

## 2021-06-12 MED ORDER — SUFENTANIL CITRATE 50 MCG/ML IV SOLN
INTRAVENOUS | Status: DC | PRN
  Administered 2021-06-12 (×2): 10 ug via INTRAVENOUS

## 2021-06-12 MED ORDER — ALBUTEROL SULFATE (2.5 MG/3ML) 0.083% IN NEBU
INHALATION_SOLUTION | RESPIRATORY_TRACT | Status: AC
  Filled 2021-06-12: qty 3

## 2021-06-12 MED ORDER — LACTATED RINGERS IV SOLN: INTRAVENOUS | Status: DC

## 2021-06-12 MED ORDER — METOPROLOL TARTRATE 50 MG PO TABS
50.0000 mg | ORAL_TABLET | Freq: Two times a day (BID) | ORAL | Status: DC
  Administered 2021-06-13 – 2021-06-15 (×5): 50 mg via ORAL
  Filled 2021-06-12 (×5): qty 1

## 2021-06-12 MED ORDER — GUAIFENESIN-DM 100-10 MG/5ML PO SYRP
15.0000 mL | ORAL_SOLUTION | ORAL | Status: DC | PRN
  Administered 2021-06-14 (×2): 15 mL via ORAL
  Filled 2021-06-12 (×2): qty 15

## 2021-06-12 MED ORDER — LIDOCAINE HCL (PF) 1 % IJ SOLN
INTRAMUSCULAR | Status: AC
Start: 2021-06-12 — End: ?
  Filled 2021-06-12: qty 5

## 2021-06-12 MED ORDER — ONDANSETRON HCL 4 MG/2ML IJ SOLN
4.0000 mg | Freq: Four times a day (QID) | INTRAMUSCULAR | Status: DC | PRN
  Administered 2021-06-14 – 2021-06-15 (×2): 4 mg via INTRAVENOUS
  Filled 2021-06-12 (×2): qty 2

## 2021-06-12 MED ORDER — HEMOSTATIC AGENTS (NO CHARGE) OPTIME
TOPICAL | Status: DC | PRN
  Administered 2021-06-12: 1 via TOPICAL

## 2021-06-12 MED ORDER — MAGNESIUM SULFATE 2 GM/50ML IV SOLN: 2.0000 g | Freq: Every day | INTRAVENOUS | Status: DC | PRN

## 2021-06-12 MED ORDER — ALBUTEROL SULFATE (2.5 MG/3ML) 0.083% IN NEBU
3.0000 mL | INHALATION_SOLUTION | Freq: Four times a day (QID) | RESPIRATORY_TRACT | Status: DC | PRN
  Administered 2021-06-13 – 2021-06-14 (×2): 3 mL via RESPIRATORY_TRACT
  Filled 2021-06-12 (×2): qty 3

## 2021-06-12 MED ORDER — MIDAZOLAM HCL 2 MG/2ML IJ SOLN
INTRAMUSCULAR | Status: AC
  Filled 2021-06-12: qty 2

## 2021-06-12 MED ORDER — INSULIN ASPART 100 UNIT/ML IJ SOLN
0.0000 [IU] | Freq: Three times a day (TID) | INTRAMUSCULAR | Status: DC
  Administered 2021-06-13: 2 [IU] via SUBCUTANEOUS
  Administered 2021-06-13: 3 [IU] via SUBCUTANEOUS
  Administered 2021-06-14: 2 [IU] via SUBCUTANEOUS

## 2021-06-12 MED ORDER — PROPOFOL 10 MG/ML IV BOLUS
INTRAVENOUS | Status: DC | PRN
  Administered 2021-06-12: 50 mg via INTRAVENOUS
  Administered 2021-06-12: 100 mg via INTRAVENOUS

## 2021-06-12 MED ORDER — PROPOFOL 10 MG/ML IV BOLUS
INTRAVENOUS | Status: AC
  Filled 2021-06-12: qty 20

## 2021-06-12 MED ORDER — AMISULPRIDE (ANTIEMETIC) 5 MG/2ML IV SOLN
INTRAVENOUS | Status: AC
  Filled 2021-06-12: qty 2

## 2021-06-12 MED ORDER — CEFAZOLIN SODIUM 1 G IJ SOLR
INTRAMUSCULAR | Status: AC
Start: 2021-06-12 — End: ?
  Filled 2021-06-12: qty 20

## 2021-06-12 MED ORDER — HYDRALAZINE HCL 20 MG/ML IJ SOLN: 5.0000 mg | INTRAMUSCULAR | Status: DC | PRN

## 2021-06-12 MED ORDER — SODIUM CHLORIDE 0.9 % IV SOLN: INTRAVENOUS | Status: AC

## 2021-06-12 MED ORDER — OXYCODONE-ACETAMINOPHEN 5-325 MG PO TABS
1.0000 | ORAL_TABLET | ORAL | Status: DC | PRN
  Administered 2021-06-13: 1 via ORAL
  Filled 2021-06-12: qty 1

## 2021-06-12 MED ORDER — SODIUM CHLORIDE 0.9 % IV SOLN: 500.0000 mL | Freq: Once | INTRAVENOUS | Status: DC | PRN

## 2021-06-12 MED ORDER — ONDANSETRON HCL 4 MG/2ML IJ SOLN
INTRAMUSCULAR | Status: DC | PRN
  Administered 2021-06-12: 4 mg via INTRAVENOUS

## 2021-06-12 MED ORDER — ACETAMINOPHEN 650 MG RE SUPP: 325.0000 mg | RECTAL | Status: DC | PRN

## 2021-06-12 MED ORDER — ACETAMINOPHEN 500 MG PO TABS: 1000.0000 mg | ORAL_TABLET | Freq: Once | ORAL | Status: AC

## 2021-06-12 MED ORDER — LABETALOL HCL 5 MG/ML IV SOLN: 10.0000 mg | INTRAVENOUS | Status: DC | PRN

## 2021-06-12 SURGICAL SUPPLY — 48 items
BAG COUNTER SPONGE SURGICOUNT (BAG) ×2 IMPLANT
BAG DECANTER FOR FLEXI CONT (MISCELLANEOUS) ×2 IMPLANT
CANISTER SUCT 3000ML PPV (MISCELLANEOUS) ×2 IMPLANT
CANNULA VESSEL 3MM 2 BLNT TIP (CANNULA) ×1 IMPLANT
CATH ROBINSON RED A/P 18FR (CATHETERS) ×2 IMPLANT
CLIP LIGATING EXTRA MED SLVR (CLIP) ×2 IMPLANT
CLIP LIGATING EXTRA SM BLUE (MISCELLANEOUS) ×2 IMPLANT
COVER PROBE W GEL 5X96 (DRAPES) ×2 IMPLANT
DERMABOND ADVANCED (GAUZE/BANDAGES/DRESSINGS) ×1
DERMABOND ADVANCED .7 DNX12 (GAUZE/BANDAGES/DRESSINGS) ×1 IMPLANT
DRAIN CHANNEL 15F RND FF W/TCR (WOUND CARE) IMPLANT
ELECT REM PT RETURN 9FT ADLT (ELECTROSURGICAL) ×2
ELECTRODE REM PT RTRN 9FT ADLT (ELECTROSURGICAL) ×1 IMPLANT
EVACUATOR SILICONE 100CC (DRAIN) IMPLANT
GLOVE BIO SURGEON STRL SZ7.5 (GLOVE) ×3 IMPLANT
GOWN STRL REUS W/ TWL LRG LVL3 (GOWN DISPOSABLE) ×2 IMPLANT
GOWN STRL REUS W/ TWL XL LVL3 (GOWN DISPOSABLE) ×1 IMPLANT
GOWN STRL REUS W/TWL LRG LVL3 (GOWN DISPOSABLE) ×2
GOWN STRL REUS W/TWL XL LVL3 (GOWN DISPOSABLE) ×1
HEMOSTAT SNOW SURGICEL 2X4 (HEMOSTASIS) IMPLANT
INSERT FOGARTY SM (MISCELLANEOUS) IMPLANT
IV ADAPTER SYR DOUBLE MALE LL (MISCELLANEOUS) IMPLANT
KIT BASIN OR (CUSTOM PROCEDURE TRAY) ×2 IMPLANT
KIT SHUNT ARGYLE CAROTID ART 6 (VASCULAR PRODUCTS) ×2 IMPLANT
KIT TURNOVER KIT B (KITS) ×2 IMPLANT
NDL HYPO 25GX1X1/2 BEV (NEEDLE) IMPLANT
NDL SPNL 20GX3.5 QUINCKE YW (NEEDLE) IMPLANT
NEEDLE HYPO 25GX1X1/2 BEV (NEEDLE) IMPLANT
NEEDLE SPNL 20GX3.5 QUINCKE YW (NEEDLE) IMPLANT
NS IRRIG 1000ML POUR BTL (IV SOLUTION) ×6 IMPLANT
PACK CAROTID (CUSTOM PROCEDURE TRAY) ×2 IMPLANT
PAD ARMBOARD 7.5X6 YLW CONV (MISCELLANEOUS) ×4 IMPLANT
PATCH VASC XENOSURE 1CMX6CM (Vascular Products) ×1 IMPLANT
PATCH VASC XENOSURE 1X6 (Vascular Products) IMPLANT
POSITIONER HEAD DONUT 9IN (MISCELLANEOUS) ×2 IMPLANT
POWDER SURGICEL 3.0 GRAM (HEMOSTASIS) ×1 IMPLANT
STOPCOCK 4 WAY LG BORE MALE ST (IV SETS) IMPLANT
SUT ETHILON 3 0 PS 1 (SUTURE) IMPLANT
SUT MNCRL AB 4-0 PS2 18 (SUTURE) ×2 IMPLANT
SUT PROLENE 6 0 BV (SUTURE) ×4 IMPLANT
SUT SILK 3 0 (SUTURE) ×1
SUT SILK 3-0 18XBRD TIE 12 (SUTURE) IMPLANT
SUT VIC AB 3-0 SH 27 (SUTURE) ×1
SUT VIC AB 3-0 SH 27X BRD (SUTURE) ×1 IMPLANT
SYR CONTROL 10ML LL (SYRINGE) IMPLANT
TOWEL GREEN STERILE (TOWEL DISPOSABLE) ×2 IMPLANT
TUBING ART PRESS 48 MALE/FEM (TUBING) IMPLANT
WATER STERILE IRR 1000ML POUR (IV SOLUTION) ×2 IMPLANT

## 2021-06-12 NOTE — Anesthesia Postprocedure Evaluation (Signed)
Anesthesia Post Note  Patient: James Wolf  Procedure(s) Performed: RIGHT CAROTID ENDARTERECTOMY WITH PATCH ANGIOPLASTY (Right: Neck)     Patient location during evaluation: PACU Anesthesia Type: General Level of consciousness: awake and alert Pain management: pain level controlled Vital Signs Assessment: post-procedure vital signs reviewed and stable Respiratory status: spontaneous breathing, nonlabored ventilation, respiratory function stable and patient connected to nasal cannula oxygen Cardiovascular status: blood pressure returned to baseline and stable Postop Assessment: no apparent nausea or vomiting Anesthetic complications: no   No notable events documented.  Last Vitals:  Vitals:   06/12/21 1620 06/12/21 1635  BP: 112/68 114/64  Pulse: 80 74  Resp: (!) 23 (!) 30  Temp:    SpO2: 98% 100%    Last Pain:  Vitals:   06/12/21 1635  TempSrc:   PainSc: Asleep                 Nolawi Kanady,W. EDMOND

## 2021-06-12 NOTE — Interval H&P Note (Signed)
History and Physical Interval Note:  06/12/2021 11:17 AM  James Wolf  has presented today for surgery, with the diagnosis of I65.21.  The various methods of treatment have been discussed with the patient and family. After consideration of risks, benefits and other options for treatment, the patient has consented to  Procedure(s): CAROTID ENDARTERECTOMY (Right) as a surgical intervention.  The patient's history has been reviewed, patient examined, no change in status, stable for surgery.  I have reviewed the patient's chart and labs.  Questions were answered to the patient's satisfaction.     Servando Snare

## 2021-06-12 NOTE — Anesthesia Preprocedure Evaluation (Addendum)
Anesthesia Evaluation  Patient identified by MRN, date of birth, ID band Patient awake    Reviewed: Allergy & Precautions, H&P , NPO status , Patient's Chart, lab work & pertinent test results, reviewed documented beta blocker date and time   Airway Mallampati: II  TM Distance: >3 FB Neck ROM: Full    Dental no notable dental hx. (+) Partial Upper, Dental Advisory Given   Pulmonary asthma , COPD,  COPD inhaler, former smoker,    Pulmonary exam normal breath sounds clear to auscultation       Cardiovascular hypertension, Pt. on medications and Pt. on home beta blockers + CAD, + Past MI and + Peripheral Vascular Disease   Rhythm:Regular Rate:Normal     Neuro/Psych negative neurological ROS  negative psych ROS   GI/Hepatic Neg liver ROS, hiatal hernia, GERD  Medicated,  Endo/Other  negative endocrine ROS  Renal/GU negative Renal ROS  negative genitourinary   Musculoskeletal   Abdominal   Peds  Hematology  (+) Blood dyscrasia, anemia ,   Anesthesia Other Findings   Reproductive/Obstetrics negative OB ROS                            Anesthesia Physical Anesthesia Plan  ASA: 3  Anesthesia Plan: General   Post-op Pain Management: Tylenol PO (pre-op)*   Induction: Intravenous  PONV Risk Score and Plan: 3 and Ondansetron, Dexamethasone and Treatment may vary due to age or medical condition  Airway Management Planned: Oral ETT  Additional Equipment: Arterial line  Intra-op Plan:   Post-operative Plan: Extubation in OR  Informed Consent: I have reviewed the patients History and Physical, chart, labs and discussed the procedure including the risks, benefits and alternatives for the proposed anesthesia with the patient or authorized representative who has indicated his/her understanding and acceptance.     Dental advisory given  Plan Discussed with: CRNA  Anesthesia Plan Comments:          Anesthesia Quick Evaluation

## 2021-06-12 NOTE — Anesthesia Procedure Notes (Signed)
Procedure Name: Intubation Date/Time: 06/12/2021 12:25 PM Performed by: Anastasio Auerbach, CRNA Pre-anesthesia Checklist: Patient identified, Emergency Drugs available, Suction available and Patient being monitored Patient Re-evaluated:Patient Re-evaluated prior to induction Oxygen Delivery Method: Circle system utilized Preoxygenation: Pre-oxygenation with 100% oxygen Induction Type: IV induction Ventilation: Mask ventilation without difficulty Laryngoscope Size: Glidescope and 3 Grade View: Grade I Tube type: Oral Number of attempts: 1 Airway Equipment and Method: Stylet and Video-laryngoscopy Placement Confirmation: ETT inserted through vocal cords under direct vision, positive ETCO2 and breath sounds checked- equal and bilateral Secured at: 23 cm Tube secured with: Tape Dental Injury: Teeth and Oropharynx as per pre-operative assessment

## 2021-06-12 NOTE — Discharge Instructions (Signed)
   [facility]  Discharge Instructions   Carotid Surgery  Please refer to the following instructions for your post-procedure care. Your surgeon or physician assistant will discuss any changes with you.  Activity  You are encouraged to walk as much as you can. You can slowly return to normal activities but must avoid strenuous activity and heavy lifting until your doctor tell you it's okay. Avoid activities such as vacuuming or swinging a golf club. You can drive after one week if you are comfortable and you are no longer taking prescription pain medications. It is normal to feel tired for serval weeks after your surgery. It is also normal to have difficulty with sleep habits, eating, and bowel movements after surgery. These will go away with time.  Bathing/Showering  Shower daily after you go home. Do not soak in a bathtub, hot tub, or swim until the incision heals completely.  Incision Care  Shower every day. Clean your incision with mild soap and water. Pat the area dry with a clean towel. You do not need a bandage unless otherwise instructed. Do not apply any ointments or creams to your incision. You may have skin glue on your incision. Do not peel it off. It will come off on its own in about one week. Your incision may feel thickened and raised for several weeks after your surgery. This is normal and the skin will soften over time.   For Men Only: It's okay to shave around the incision but do not shave the incision itself for 2 weeks. It is common to have numbness under your chin that could last for several months.  Diet  Resume your normal diet. There are no special food restrictions following this procedure. A low fat/low cholesterol diet is recommended for all patients with vascular disease. In order to heal from your surgery, it is CRITICAL to get adequate nutrition. Your body requires vitamins, minerals, and protein. Vegetables are the best source of  vitamins and minerals. Vegetables also provide the perfect balance of protein. Processed food has little nutritional value, so try to avoid this.  Medications  Resume taking all of your medications unless your doctor or physician assistant tells you not to. If your incision is causing pain, you may take over-the- counter pain relievers such as acetaminophen (Tylenol). If you were prescribed a stronger pain medication, please be aware these medications can cause nausea and constipation. Prevent nausea by taking the medication with a snack or meal. Avoid constipation by drinking plenty of fluids and eating foods with a high amount of fiber, such as fruits, vegetables, and grains.   Do not take Tylenol if you are taking prescription pain medications.  Follow Up  Our office will schedule a follow up appointment 2-3 weeks following discharge.  Please call us immediately for any of the following conditions  . Increased pain, redness, drainage (pus) from your incision site. . Fever of 101 degrees or higher. . If you should develop stroke (slurred speech, difficulty swallowing, weakness on one side of your body, loss of vision) you should call 911 and go to the nearest emergency room. .  Reduce your risk of vascular disease:  . Stop smoking. If you would like help call [facility] at 1-800-QUIT-NOW (1-800-784-8669) or [place] at 336-586-4000. . Manage your cholesterol . Maintain a desired weight . Control your diabetes . Keep your blood pressure down .  If you have any questions, please call the office at [phone]. 

## 2021-06-12 NOTE — Progress Notes (Signed)
Pt admitted to rm 27 from PACU. CHG wipe given. Oriented pt to the unit. Initiated tele. VSS. Call bell within reach.   Lavenia Atlas, RN

## 2021-06-12 NOTE — Progress Notes (Signed)
  Day of Surgery Note    Subjective:  no complaints   Vitals:   06/12/21 1620 06/12/21 1635  BP: 112/68 114/64  Pulse: 80 74  Resp: (!) 23 (!) 30  Temp:    SpO2: 98% 100%    Incisions:   clean and dry Neuro:  in tact; moving all extremities equally; tongue is midline Lungs:  non labored    Assessment/Plan:  This is a 71 y.o. male who is s/p  Right CEA  -pt doing well in recovery and neuro in tact -to [place] this afternoon -anticipate dc tomorrow if evening is uneventful    Leontine Locket, PA-C 06/12/2021 4:49 PM [phone]

## 2021-06-12 NOTE — Transfer of Care (Signed)
Immediate Anesthesia Transfer of Care Note  Patient: James Wolf  Procedure(s) Performed: RIGHT CAROTID ENDARTERECTOMY WITH PATCH ANGIOPLASTY (Right: Neck)  Patient Location: PACU  Anesthesia Type:General  Level of Consciousness: awake, oriented and patient cooperative  Airway & Oxygen Therapy: Patient connected to face mask oxygen  Post-op Assessment: Report given to RN, Post -op Vital signs reviewed and stable and Patient moving all extremities X 4  Post vital signs: Reviewed and stable  Last Vitals:  Vitals Value Taken Time  BP 100/78 06/12/21 1434  Temp    Pulse 79 06/12/21 1439  Resp 24 06/12/21 1439  SpO2 91 % 06/12/21 1439  Vitals shown include unvalidated device data.  Last Pain:  Vitals:   06/12/21 1022  TempSrc:   PainSc: 1       Patients Stated Pain Goal: 2 (07/62/26 3335)  Complications: No notable events documented.

## 2021-06-12 NOTE — Op Note (Addendum)
Patient name: James Wolf MRN: [id number] DOB: 09-29-1950 Sex: male  06/12/2021 Pre-operative Diagnosis: Asymptomatic right ICA stenosis Post-operative diagnosis:  Same Surgeon:  Erlene Quan C. Donzetta Matters, MD Assistant: Arlee Muslim, PA Procedure Performed:  Right carotid endarterectomy with bovine pericardial patch angioplasty  Indications: 71 year old male with high-grade right ICA stenosis found initially 1 year ago prior to CABG and now during work-up for shoulder surgery.  He is now indicated for right carotid endarterectomy.  Findings: Right ICA was mixed calcified and soft plaque actually had approximately 3 cm lesion calcified more proximally and softer apply.  There was very strong backbleeding from the ICA and given how high the lesion was I elected not to place a shunt.  At completion there were low resistance signal by Doppler in the ICA distally patient was neurologically intact upon awakening from anesthesia.   Procedure:  The patient was identified in the holding area and taken to the operating room was put supine on upper table and general anesthesia was induced.  He was sterilely prepped and draped in the right neck chest in usual fashion, antibiotics were minister timeout was called.  Incision was made along the anterior border the sternocleidomastoid I dissected down to the internal jugular vein retracted this laterally identify the common carotid artery and placed an umbilical tape around the patient was fully heparinized.  We dissected higher up to the external carotid artery placed Vesseloops around this as well as the first branch which was the superior thyroidal.  Facial vein branches were divided between ties.  We dissected high onto the ICA where it appeared normal and placed a vessel loop around this.  The hypoglossal nerve was identified and protected.  We prepared a 10 [place] shunt and bovine pericardial patch.  ACT returned over 300 and the mean arterial pressure was greater  than 90.  At this time we clamped the ICA followed by the common carotid and external carotid arteries.  I opened the vessel longitudinally and irrigated with heparinized saline.  I allowed backbleeding from the ICA which was pulsatile given how high this lesion was I elected not to place a shunt.  We proceeded with endarterectomy including eversion of the external and when the entire plaque had been removed and smoothed out we thoroughly irrigated with heparinized saline and sewed a bovine pericardial patch in place with 6-0 Prolene suture.  Prior to completion we flushed in all directions.  The carotid bulb was thoroughly irrigated after this.  We then completed the anastomosis and released the clamp on the external followed by the common carotid arteries and after several cardiac cycles the internal carotid artery.  Signals in the ICA distally were as expected with low resistance.  50 mg of protamine was administered.  We obtain hemostasis in the wound and thoroughly irrigated the wound and then closed the platysma with Vicryl and the skin with Monocryl.  Dermabond placed at the skin level.  Patient was awakened from anesthesia was able to follow commands and was neurologically intact and transferred to the recovery area in stable condition.  All counts were correct at completion.   Given the complexity of the case,  the assistant was necessary in order to expedite the procedure and safely perform the technical aspects of the operation.  The assistant provided traction and countertraction to assist with exposure of the common carotid artery, external carotid artery, and internal carotid artery.  They also assisted with suture ligatures and dividing the facial vein and multiple  small venous branches tethering the hypoglossal nerve.  In addition they were necessary to provide adequate traction and countertraction to perform a precise endarterectomy and precise closure.  These skills, especially following the  Prolene suture for the anastomosis, could not have been adequately performed by a scrub tech assistant.    EBL: 100cc   Amauri Medellin C. Donzetta Matters, MD [facility] [place] Office: [phone] Pager: [phone]

## 2021-06-12 NOTE — Anesthesia Procedure Notes (Signed)
Arterial Line Insertion Start/End6/06/2021 10:15 AM, 06/12/2021 10:22 AM Performed by: Moshe Salisbury, CRNA, CRNA  Patient location: Pre-op. Preanesthetic checklist: patient identified, IV checked, site marked, risks and benefits discussed, surgical consent, monitors and equipment checked, pre-op evaluation, timeout performed and anesthesia consent Lidocaine 1% used for infiltration Left, radial was placed Catheter size: 20 G Hand hygiene performed , maximum sterile barriers used  and Seldinger technique used  Attempts: 1 Procedure performed without using ultrasound guided technique. Following insertion, dressing applied and Biopatch. Post procedure assessment: normal  Patient tolerated the procedure well with no immediate complications.

## 2021-06-13 ENCOUNTER — Encounter (HOSPITAL_COMMUNITY): Payer: Self-pay | Admitting: Vascular Surgery

## 2021-06-13 LAB — GLUCOSE, CAPILLARY
Glucose-Capillary: 156 mg/dL — ABNORMAL HIGH (ref 70–99)
Glucose-Capillary: 136 mg/dL — ABNORMAL HIGH (ref 70–99)
Glucose-Capillary: 120 mg/dL — ABNORMAL HIGH (ref 70–99)
Glucose-Capillary: 137 mg/dL — ABNORMAL HIGH (ref 70–99)

## 2021-06-13 LAB — CBC
HCT: 34.3 % — ABNORMAL LOW (ref 39.0–52.0)
Hemoglobin: 11.2 g/dL — ABNORMAL LOW (ref 13.0–17.0)
MCH: 28.6 pg (ref 26.0–34.0)
MCHC: 32.7 g/dL (ref 30.0–36.0)
MCV: 87.5 fL (ref 80.0–100.0)
Platelets: 161 10*3/uL (ref 150–400)
RBC: 3.92 MIL/uL — ABNORMAL LOW (ref 4.22–5.81)
RDW: 15.5 % (ref 11.5–15.5)
WBC: 8.6 10*3/uL (ref 4.0–10.5)
nRBC: 0 % (ref 0.0–0.2)

## 2021-06-13 LAB — HEMOGLOBIN A1C
Hgb A1c MFr Bld: 6.4 % — ABNORMAL HIGH (ref 4.8–5.6)
Mean Plasma Glucose: 136.98 mg/dL

## 2021-06-13 LAB — BASIC METABOLIC PANEL
Anion gap: 11 (ref 5–15)
BUN: 17 mg/dL (ref 8–23)
CO2: 19 mmol/L — ABNORMAL LOW (ref 22–32)
Calcium: 8.2 mg/dL — ABNORMAL LOW (ref 8.9–10.3)
Chloride: 107 mmol/L (ref 98–111)
Creatinine, Ser: 1.38 mg/dL — ABNORMAL HIGH (ref 0.61–1.24)
GFR, Estimated: 55 mL/min — ABNORMAL LOW (ref >60)
Glucose, Bld: 147 mg/dL — ABNORMAL HIGH (ref 70–99)
Potassium: 3.9 mmol/L (ref 3.5–5.1)
Sodium: 137 mmol/L (ref 135–145)

## 2021-06-13 MED ORDER — PANTOPRAZOLE SODIUM 40 MG PO TBEC
40.0000 mg | DELAYED_RELEASE_TABLET | Freq: Every day | ORAL | Status: AC
  Administered 2021-06-13 – 2021-06-14 (×2): 40 mg via ORAL
  Filled 2021-06-13 (×2): qty 1

## 2021-06-13 MED ORDER — PHENYLEPHRINE HCL-NACL 20-0.9 MG/250ML-% IV SOLN
INTRAVENOUS | Status: AC
  Filled 2021-06-13: qty 250

## 2021-06-13 NOTE — Progress Notes (Signed)
Pt's oxygen saturation at RA at rest: 87 % Pt's oxygen saturation at 1L [place]: 94%  Lavenia Atlas, RN

## 2021-06-13 NOTE — Progress Notes (Signed)
Mobility Specialist: Progress Note   06/13/21 1719  Mobility  Activity Ambulated with assistance in hallway  Level of Assistance Minimal assist, patient does 75% or more  Assistive Device None  Distance Ambulated (ft) 30 ft  Activity Response Tolerated well  $Mobility charge 1 Mobility   Pre-Mobility: 79 HR, 96% SpO2 During Mobility: 90 HR Post-Mobility: 79 HR, 94% SpO2  Pt received in the bed and agreeable to ambulation. Mod I with bed mobility as well as to stand. Ambulated on 2 L/min [place]. MinA during ambulation to assist with balance. Pt back to bed after session with call bell and phone at his side.   [facility] Jahnasia Tatum Mobility Specialist Mobility Specialist [address] [place]: [phone]

## 2021-06-13 NOTE — Progress Notes (Addendum)
  Progress Note    06/13/2021 7:56 AM 1 Day Post-Op  Subjective:  Patient reports substernal, dull chest pain that developed overnight. Pain radiates into both arms. Says pain is controlled now at a 4/10 after morphine in the early morning. Reports his neck is "a little sore". Has not eaten yet or gotten out of bed, but he will attempt to eat breakfast today. Patient's wife is at bedside.   Vitals:   06/13/21 0455 06/13/21 0701  BP: 121/75   Pulse:  (!) 102  Resp:    Temp:    SpO2: 93% 95%    Physical Exam: Cardiac:  regular Lungs:  nonlabored, on nasal cannula at 2L Incisions:  neck incision clean and dry without swelling or hematoma Neuro: intact; grip strength equal bilaterally, no tongue deviation. Abdomen:  soft, nontender  CBC    Component Value Date/Time   WBC 8.6 06/13/2021 0213   RBC 3.92 (L) 06/13/2021 0213   HGB 11.2 (L) 06/13/2021 0213   HCT 34.3 (L) 06/13/2021 0213   PLT 161 06/13/2021 0213   MCV 87.5 06/13/2021 0213   MCH 28.6 06/13/2021 0213   MCHC 32.7 06/13/2021 0213   RDW 15.5 06/13/2021 0213   LYMPHSABS 0.4 (L) 10/14/2020 1037   MONOABS 0.6 10/14/2020 1037   EOSABS 0.0 10/14/2020 1037   BASOSABS 0.0 10/14/2020 1037    BMET    Component Value Date/Time   NA 137 06/13/2021 0213   K 3.9 06/13/2021 0213   CL 107 06/13/2021 0213   CO2 19 (L) 06/13/2021 0213   GLUCOSE 147 (H) 06/13/2021 0213   BUN 17 06/13/2021 0213   CREATININE 1.38 (H) 06/13/2021 0213   CALCIUM 8.2 (L) 06/13/2021 0213   GFRNONAA 55 (L) 06/13/2021 0213   GFRAA >60 11/29/2018 1919    INR    Component Value Date/Time   INR 1.0 06/12/2021 1018     Intake/Output Summary (Last 24 hours) at 06/13/2021 0756 Last data filed at 06/13/2021 0535 Gross per 24 hour  Intake 2915.61 ml  Output 350 ml  Net 2565.61 ml     Assessment/Plan:  71 y.o. male is 1 day post op, s/p: right CEA   -Patient has been slow to recover and has not eaten yet or started getting out of bed  yet. -Will place PT eval to prepare for d/c, potentially tomorrow -Will see if patient can swallow solid foods today without issue -Patient still on 2L nasal cannula, not on O2 at home at baseline.  -Patient developed substernal chest pain overnight and received an EKG and morphine. Pain is controlled now, and EKG revealed normal sinus. Will monitor. -Neck incision is clean and dry without swelling or hematoma -Neuro intact   Vicente Serene, PA-C [facility] [phone] 06/13/2021 7:56 AM   I have interviewed and examined patient with PA and agree with assessment and plan above.   Geddy Boydstun C. Donzetta Matters, MD [facility] [place] Office: [phone] Pager: [phone]

## 2021-06-13 NOTE — Progress Notes (Signed)
Education about using flutter valve and Incentive spirometer gave to pt and his wife.  Lavenia Atlas, RN

## 2021-06-13 NOTE — Progress Notes (Signed)
Verbal order received for Protonix 40 mg PO twice daily.  Lavenia Atlas, RN

## 2021-06-13 NOTE — Progress Notes (Signed)
Reinforced education on flutter valve and incentive spirometer and their roles in helping the nasal cannula oxygen demand and post surgery complications. Pt verbalized understanding.  Lavenia Atlas, RN

## 2021-06-13 NOTE — Evaluation (Signed)
Physical Therapy Evaluation Patient Details Name: James Wolf MRN: [id number] DOB: May 21, 1950 Today's Date: 06/13/2021  History of Present Illness  Pt is a 71 y/o male s/p R carotid endarterectomy. PMH includes CAD s/p CABG, esophageal cancer, COPD, HTN, and osteoporosis.  Clinical Impression  Pt admitted secondary to problem above with deficits below. Pt very slow with mobility tasks secondary to fatigue. Required min guard to min A for mobility this session. Anticipate pt will progress well. Recommending HHPT given current deficits. Feel pt would benefit from AD, but pt may refuse. Will continue to follow acutely.        Recommendations for follow up therapy are one component of a multi-disciplinary discharge planning process, led by the attending physician.  Recommendations may be updated based on patient status, additional functional criteria and insurance authorization.  Follow Up Recommendations Home health PT (may progress to outpatient)    Assistance Recommended at Discharge Intermittent Supervision/Assistance  Patient can return home with the following  A little help with bathing/dressing/bathroom;Assist for transportation;Assistance with cooking/housework    Equipment Recommendations Rolling walker (2 wheels)  Recommendations for Other Services       Functional Status Assessment Patient has had a recent decline in their functional status and demonstrates the ability to make significant improvements in function in a reasonable and predictable amount of time.     Precautions / Restrictions Precautions Precautions: Fall Restrictions Weight Bearing Restrictions: No      Mobility  Bed Mobility Overal bed mobility: Modified Independent             General bed mobility comments: Increased time to perform and requried extended time at EOB secondary to fatigue    Transfers Overall transfer level: Needs assistance Equipment used: 1 person hand held  assist Transfers: Sit to/from Stand Sit to Stand: Min assist           General transfer comment: assist for lift assist and steadying    Ambulation/Gait Ambulation/Gait assistance: Min guard Gait Distance (Feet): 30 Feet Assistive device: 1 person hand held assist Gait Pattern/deviations: Step-through pattern, Decreased stride length Gait velocity: Decreased     General Gait Details: Min guard for safety. Holding to wifes hand for support. No LOB noted. Pt reporting increased faitgue with mobility and requesting to stay in room.  Stairs            Wheelchair Mobility    Modified Rankin (Stroke Patients Only)       Balance Overall balance assessment: Mild deficits observed, not formally tested                                           Pertinent Vitals/Pain Pain Assessment Pain Assessment: Faces Faces Pain Scale: Hurts a little bit Pain Location: chest Pain Descriptors / Indicators: Grimacing, Guarding Pain Intervention(s): Limited activity within patient's tolerance, Monitored during session, Repositioned    Home Living Family/patient expects to be discharged to:: Private residence Living Arrangements: Spouse/significant other Available Help at Discharge: Family Type of Home: House Home Access: Stairs to enter Entrance Stairs-Rails: Right Entrance Stairs-Number of Steps: 4   Home Layout: One level Home Equipment: None      Prior Function Prior Level of Function : Independent/Modified Independent                     Hand Dominance  Extremity/Trunk Assessment   Upper Extremity Assessment Upper Extremity Assessment: Defer to OT evaluation    Lower Extremity Assessment Lower Extremity Assessment: Generalized weakness    Cervical / Trunk Assessment Cervical / Trunk Assessment: Normal  Communication   Communication: No difficulties  Cognition Arousal/Alertness: Awake/alert Behavior During Therapy: WFL for  tasks assessed/performed Overall Cognitive Status: Within Functional Limits for tasks assessed                                          General Comments General comments (skin integrity, edema, etc.): pt's wife present and supportive    Exercises     Assessment/Plan    PT Assessment Patient needs continued PT services  PT Problem List Decreased strength;Decreased mobility;Decreased activity tolerance       PT Treatment Interventions DME instruction;Gait training;Functional mobility training;Therapeutic activities;Stair training;Therapeutic exercise;Balance training;Patient/family education    PT Goals (Current goals can be found in the Care Plan section)  Acute Rehab PT Goals Patient Stated Goal: to feel better PT Goal Formulation: With patient Time For Goal Achievement: 06/27/21 Potential to Achieve Goals: Good    Frequency Min 3X/week     Co-evaluation               AM-PAC PT "6 Clicks" Mobility  Outcome Measure Help needed turning from your back to your side while in a flat bed without using bedrails?: None Help needed moving from lying on your back to sitting on the side of a flat bed without using bedrails?: None Help needed moving to and from a bed to a chair (including a wheelchair)?: A Little Help needed standing up from a chair using your arms (e.g., wheelchair or bedside chair)?: A Little Help needed to walk in hospital room?: A Little Help needed climbing 3-5 steps with a railing? : A Lot 6 Click Score: 19    End of Session Equipment Utilized During Treatment: Oxygen Activity Tolerance: Patient limited by fatigue Patient left: in bed;with call bell/phone within reach Nurse Communication: Mobility status PT Visit Diagnosis: Other abnormalities of gait and mobility (R26.89);Difficulty in walking, not elsewhere classified (R26.2)    Time: 3007-6226 PT Time Calculation (min) (ACUTE ONLY): 26 min   Charges:   PT Evaluation $PT Eval  Low Complexity: 1 Low PT Treatments $Gait Training: 8-22 mins        Lou Miner, DPT  Acute Rehabilitation Services  Office: [phone]   Rudean Hitt 06/13/2021, 9:50 AM

## 2021-06-13 NOTE — Progress Notes (Signed)
   06/13/21 0455  Vitals  BP 121/75  MAP (mmHg) 88  BP Location Left Arm  BP Method Automatic  Patient Position (if appropriate) Lying  Pulse Rate Source Monitor  ECG Heart Rate 99  MEWS COLOR  MEWS Score Color Green  Oxygen Therapy  SpO2 93 %  O2 Device Nasal Cannula  O2 Flow Rate (L/min) 2 L/min  MEWS Score  MEWS Temp 0  MEWS Systolic 0  MEWS Pulse 0  MEWS RR 0  MEWS LOC 0  MEWS Score 0   Pt called out c/o midsternal "burning" chest pain that started a few hours ago, but "was just starting to get noticeable" when he called out.  Vitals as above. EKG done and placed in chart- HR 101 sinus tach.  Rates pain 6/10. Received 1 percoset around 0230 for incisional pain at neck. Pt states that neck pain eased off, but chest pain has been slowly creeping up since then. Gave '2mg'$  IV morphine, pt now rates pain 4/10.  Donzetta Matters MD notified, no new orders.

## 2021-06-14 ENCOUNTER — Inpatient Hospital Stay (HOSPITAL_COMMUNITY): Payer: Medicare Other

## 2021-06-14 LAB — GLUCOSE, CAPILLARY
Glucose-Capillary: 122 mg/dL — ABNORMAL HIGH (ref 70–99)
Glucose-Capillary: 126 mg/dL — ABNORMAL HIGH (ref 70–99)
Glucose-Capillary: 113 mg/dL — ABNORMAL HIGH (ref 70–99)
Glucose-Capillary: 125 mg/dL — ABNORMAL HIGH (ref 70–99)

## 2021-06-14 IMAGING — DX DG CHEST 1V PORT
1 series · 1 of 1 positions shown · non-contrast
Comparison: [DATE] chest radiograph.

CLINICAL DATA: Supplemental oxygen requirement

EXAM:
PORTABLE CHEST 1 VIEW

[chest ap]
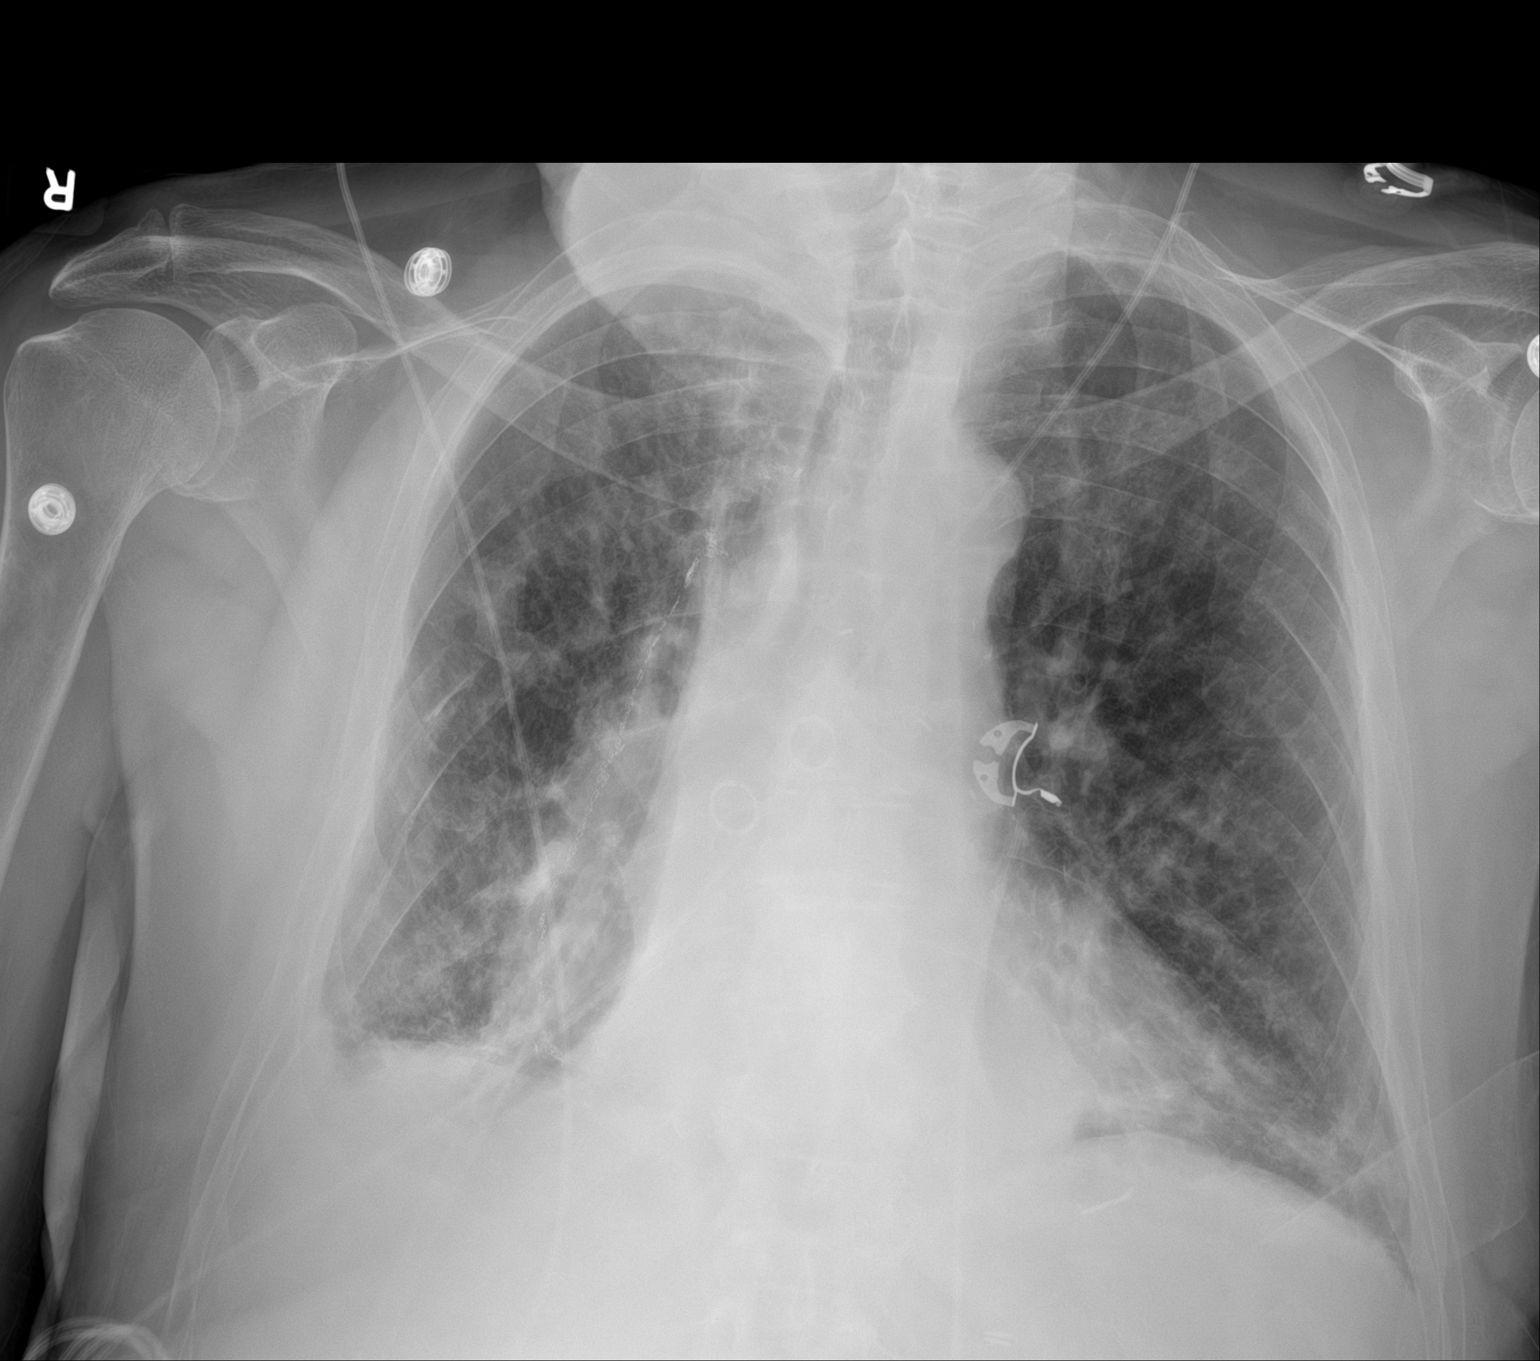

[1 of 1 positions shown; findings below may reference images not displayed]

FINDINGS: Surgical sutures overlie the right hemithorax. Stable
cardiomediastinal silhouette with normal heart size. No
pneumothorax. Small right pleural effusion. No left pleural
effusion. Streaky patchy bibasilar lung opacities.
IMPRESSION: Streaky patchy bibasilar lung opacities, which could represent any
combination of scarring, atelectasis, aspiration or pneumonia. Small
right pleural effusion. Follow-up chest radiographs advised.

## 2021-06-14 NOTE — Progress Notes (Signed)
  Progress Note    06/14/2021 11:34 AM 2 Days Post-Op  Subjective: Still requiring oxygen, no appetite  Vitals:   06/14/21 0653 06/14/21 0843  BP:  133/87  Pulse:  98  Resp:  20  Temp:  98.2 F (36.8 C)  SpO2: 92% 93%    Physical Exam: Awake alert oriented Moving all extremities without mentation Right neck is soft without hematoma and incision is clean dry intact  CBC    Component Value Date/Time   WBC 8.6 06/13/2021 0213   RBC 3.92 (L) 06/13/2021 0213   HGB 11.2 (L) 06/13/2021 0213   HCT 34.3 (L) 06/13/2021 0213   PLT 161 06/13/2021 0213   MCV 87.5 06/13/2021 0213   MCH 28.6 06/13/2021 0213   MCHC 32.7 06/13/2021 0213   RDW 15.5 06/13/2021 0213   LYMPHSABS 0.4 (L) 10/14/2020 1037   MONOABS 0.6 10/14/2020 1037   EOSABS 0.0 10/14/2020 1037   BASOSABS 0.0 10/14/2020 1037    BMET    Component Value Date/Time   NA 137 06/13/2021 0213   K 3.9 06/13/2021 0213   CL 107 06/13/2021 0213   CO2 19 (L) 06/13/2021 0213   GLUCOSE 147 (H) 06/13/2021 0213   BUN 17 06/13/2021 0213   CREATININE 1.38 (H) 06/13/2021 0213   CALCIUM 8.2 (L) 06/13/2021 0213   GFRNONAA 55 (L) 06/13/2021 0213   GFRAA >60 11/29/2018 1919    INR    Component Value Date/Time   INR 1.0 06/12/2021 1018     Intake/Output Summary (Last 24 hours) at 06/14/2021 1134 Last data filed at 06/14/2021 0843 Gross per 24 hour  Intake 110 ml  Output 800 ml  Net -690 ml     Assessment/plan:  71 y.o. male is status post right CEA for asymptomatic disease.  Patient thinks that he had an aspiration event given history of esophageal surgery.  X-ray this morning is consistent with this.  We will continue oxygen get respiratory therapy involved, holding on antibiotics as he has not had fevers or appear toxic from this at this time.    Siarra Gilkerson C. Donzetta Matters, MD [facility] [place] Office: [phone] Pager: [phone]  06/14/2021 11:34 AM

## 2021-06-14 NOTE — Progress Notes (Signed)
  Progress Note    06/14/2021 11:40 AM 2 Days Post-Op  Subjective:  Patient reports eating a little yesterday and swallowing fine, however he has little appetite. Chest pain has resolved. Neck incision is still a little sore. No other concerns    Vitals:   06/14/21 0653 06/14/21 0843  BP:  133/87  Pulse:  98  Resp:  20  Temp:  98.2 F (36.8 C)  SpO2: 92% 93%    Physical Exam: Cardiac:  regular Lungs:  non-labored at rest, clear to auscultation. On nasal cannula at 2L Incisions:  neck incision c/d/l with minimal swelling and no hematoma Neuro: intact; grip strength equal bilaterally, no facial droop, no tongue deviation  CBC    Component Value Date/Time   WBC 8.6 06/13/2021 0213   RBC 3.92 (L) 06/13/2021 0213   HGB 11.2 (L) 06/13/2021 0213   HCT 34.3 (L) 06/13/2021 0213   PLT 161 06/13/2021 0213   MCV 87.5 06/13/2021 0213   MCH 28.6 06/13/2021 0213   MCHC 32.7 06/13/2021 0213   RDW 15.5 06/13/2021 0213   LYMPHSABS 0.4 (L) 10/14/2020 1037   MONOABS 0.6 10/14/2020 1037   EOSABS 0.0 10/14/2020 1037   BASOSABS 0.0 10/14/2020 1037    BMET    Component Value Date/Time   NA 137 06/13/2021 0213   K 3.9 06/13/2021 0213   CL 107 06/13/2021 0213   CO2 19 (L) 06/13/2021 0213   GLUCOSE 147 (H) 06/13/2021 0213   BUN 17 06/13/2021 0213   CREATININE 1.38 (H) 06/13/2021 0213   CALCIUM 8.2 (L) 06/13/2021 0213   GFRNONAA 55 (L) 06/13/2021 0213   GFRAA >60 11/29/2018 1919    INR    Component Value Date/Time   INR 1.0 06/12/2021 1018     Intake/Output Summary (Last 24 hours) at 06/14/2021 1140 Last data filed at 06/14/2021 0843 Gross per 24 hour  Intake 110 ml  Output 800 ml  Net -690 ml     Assessment/Plan:  71 y.o. male is day 2 post op, s/p: right CEA   -Patient has been able to eat and get out of bed -Substernal chest pain has resolved overnight -Neck incision is clean and dry with minimal swelling, no hematoma -Neuro intact -Patient requires 2L O2 still.  Will desat to 87% on RA at rest. Patient is not on O2 at baseline. Will attempt to wean off and consult to respiratory therapy -CXR ordered with concern for aspiration, findings are consistent. Patient feels short of breath when moving small distances. Since the patient has no fevers and does not appear toxic, will hold on antibiotics at this time. -Will continue to get the patient up and moving -Attempt to d/c tomorrow  Vicente Serene, PA-C [facility] [phone] 06/14/2021 11:40 AM

## 2021-06-14 NOTE — Progress Notes (Signed)
Mobility Specialist: Progress Note   06/14/21 1403  Mobility  Activity Ambulated with assistance in hallway  Level of Assistance Contact guard assist, steadying assist  Assistive Device Front wheel walker  Distance Ambulated (ft) 150 ft  Activity Response Tolerated well  $Mobility charge 1 Mobility   Pre-Mobility: 106 HR, 96% SpO2 Post-Mobility: 99 HR, 95% SpO2  Pt received in bed and agreeable to ambulation. Nausea resolved after receiving medication. Ambulated on 3 L/min [place]. C/o feeling general fatigue/weak. Pt back to bed after session with call bell and phone at his side.   [facility] Pranit Owensby Mobility Specialist Mobility Specialist 4 East: [phone]

## 2021-06-14 NOTE — Progress Notes (Signed)
Physical Therapy Treatment Patient Details Name: James Wolf MRN: [id number] DOB: Jun 01, 1950 Today's Date: 06/14/2021   History of Present Illness Pt is a 71 y/o male s/p R carotid endarterectomy 06/12/21. CXR 6/8 consistent with concern for aspiration. PMH includes CAD s/p CABG, esophageal CA (s/p esophagectomy), COPD, HTN, osteoporosis.   PT Comments    Pt slowly progressing with mobility. Pt with c/o nausea upon sitting and standing, declining further mobility secondary to this. Increased time discussing activity recommendations and therex options within confines of lines/O2. Pt also c/o dizziness upon standing, BP 134/81. Pt declines need for follow-up PT services, wife in agreement. Encouraged more frequent OOB activity. Will continue to follow acutely to address established goals.  SpO2 down to 84% on RA SpO2 92% on 3L O2 James Wolf     Recommendations for follow up therapy are one component of a multi-disciplinary discharge planning process, led by the attending physician.  Recommendations may be updated based on patient status, additional functional criteria and insurance authorization.  Follow Up Recommendations  No PT follow up     Assistance Recommended at Discharge Intermittent Supervision/Assistance  Patient can return home with the following A little help with bathing/dressing/bathroom;Assist for transportation;Assistance with cooking/housework   Equipment Recommendations  None recommended by PT    Recommendations for Other Services       Precautions / Restrictions Precautions Precautions: Fall;Other (comment) Precaution Comments: watch SpO2 (does not wear baseline) Restrictions Weight Bearing Restrictions: No     Mobility  Bed Mobility Overal bed mobility: Modified Independent             General bed mobility comments: multiple supine<>sit from elevated HOB due to c/o nausea    Transfers Overall transfer level: Needs assistance Equipment used:  None Transfers: Sit to/from Stand Sit to Stand: Supervision           General transfer comment: supervision for safety/lines    Ambulation/Gait               General Gait Details:  (pt declined due to c/o nausea and dizziness)   Stairs             Wheelchair Mobility    Modified Rankin (Stroke Patients Only)       Balance Overall balance assessment: Needs assistance Sitting-balance support: No upper extremity supported, Feet supported Sitting balance-Leahy Scale: Good     Standing balance support: No upper extremity supported Standing balance-Leahy Scale: Fair                              Cognition Arousal/Alertness: Awake/alert Behavior During Therapy: WFL for tasks assessed/performed Overall Cognitive Status: Within Functional Limits for tasks assessed                                          Exercises      General Comments General comments (skin integrity, edema, etc.): SpO2 down to 84% on RA sitting EOB; SpO2 92% on 3L O2 James Wolf. pt's wife present and supportive - pt's activity limited by nausea this session, encouraged more frequent in-room mobility within confines of O2 line (repeated sit<>stands, marching in place, walking forwards/backwards). pt c/o dizziness upon standnig, BP 134/81      Pertinent Vitals/Pain Pain Assessment Pain Assessment: Faces Faces Pain Scale: Hurts a little bit Pain Location: abdomen (nausea) Pain Descriptors /  Indicators: Discomfort Pain Intervention(s): Monitored during session, Limited activity within patient's tolerance    Home Living                          Prior Function            PT Goals (current goals can now be found in the care plan section) Progress towards PT goals: Not progressing toward goals - comment (limited by fatigue, nausea)    Frequency    Min 3X/week      PT Plan Discharge plan needs to be updated    Co-evaluation               AM-PAC PT "6 Clicks" Mobility   Outcome Measure  Help needed turning from your back to your side while in a flat bed without using bedrails?: None Help needed moving from lying on your back to sitting on the side of a flat bed without using bedrails?: None Help needed moving to and from a bed to a chair (including a wheelchair)?: A Little Help needed standing up from a chair using your arms (e.g., wheelchair or bedside chair)?: A Little Help needed to walk in hospital room?: A Little Help needed climbing 3-5 steps with a railing? : A Little 6 Click Score: 20    End of Session Equipment Utilized During Treatment: Oxygen Activity Tolerance: Patient limited by fatigue;Other (comment) (nausea) Patient left: in bed;with call bell/phone within reach;with family/visitor present Nurse Communication: Mobility status PT Visit Diagnosis: Other abnormalities of gait and mobility (R26.89);Difficulty in walking, not elsewhere classified (R26.2)     Time: 6761-9509 PT Time Calculation (min) (ACUTE ONLY): 26 min  Charges:  $Self Care/Home Management: James Wolf, PT, DPT Acute Rehabilitation Services  Pager [phone] Office [phone]  James Wolf 06/14/2021, 12:16 PM

## 2021-06-14 NOTE — Progress Notes (Signed)
OT Cancellation Note  Patient Details Name: TYRIK STETZER MRN: [id number] DOB: 1950/01/27   Cancelled Treatment:    Reason Eval/Treat Not Completed: Patient declined, no reason specified.  Recently returned to bed.  Continue efforts.    Aubrey Voong D Shan Valdes 06/14/2021, 4:25 PM

## 2021-06-15 LAB — GLUCOSE, CAPILLARY
Glucose-Capillary: 117 mg/dL — ABNORMAL HIGH (ref 70–99)
Glucose-Capillary: 122 mg/dL — ABNORMAL HIGH (ref 70–99)

## 2021-06-15 MED ORDER — OXYCODONE-ACETAMINOPHEN 5-325 MG PO TABS: 1.0000 | ORAL_TABLET | Freq: Four times a day (QID) | ORAL | 0 refills | Status: DC | PRN

## 2021-06-15 NOTE — Progress Notes (Addendum)
  Progress Note    06/15/2021 9:48 AM 3 Days Post-Op  Subjective:  Patient reports minimal pain at neck incision site. Says he feels stronger today and is able to get around more. He is ready to go home today.   Vitals:   06/15/21 0617 06/15/21 0846  BP: (!) 151/92 (!) 144/86  Pulse: 97 (!) 101  Resp: (!) 22 20  Temp: 98.2 F (36.8 C) 98.9 F (37.2 C)  SpO2: 91% 90%    Physical Exam: Cardiac:  regular Lungs:  nonlabored, on 1L nasal cannula at rest Incisions:  neck incision is c/d/l with minimal swelling, no hematoma  CBC    Component Value Date/Time   WBC 8.6 06/13/2021 0213   RBC 3.92 (L) 06/13/2021 0213   HGB 11.2 (L) 06/13/2021 0213   HCT 34.3 (L) 06/13/2021 0213   PLT 161 06/13/2021 0213   MCV 87.5 06/13/2021 0213   MCH 28.6 06/13/2021 0213   MCHC 32.7 06/13/2021 0213   RDW 15.5 06/13/2021 0213   LYMPHSABS 0.4 (L) 10/14/2020 1037   MONOABS 0.6 10/14/2020 1037   EOSABS 0.0 10/14/2020 1037   BASOSABS 0.0 10/14/2020 1037    BMET    Component Value Date/Time   NA 137 06/13/2021 0213   K 3.9 06/13/2021 0213   CL 107 06/13/2021 0213   CO2 19 (L) 06/13/2021 0213   GLUCOSE 147 (H) 06/13/2021 0213   BUN 17 06/13/2021 0213   CREATININE 1.38 (H) 06/13/2021 0213   CALCIUM 8.2 (L) 06/13/2021 0213   GFRNONAA 55 (L) 06/13/2021 0213   GFRAA >60 11/29/2018 1919    INR    Component Value Date/Time   INR 1.0 06/12/2021 1018     Intake/Output Summary (Last 24 hours) at 06/15/2021 0948 Last data filed at 06/14/2021 1300 Gross per 24 hour  Intake 240 ml  Output --  Net 240 ml     Assessment/Plan:  71 y.o. male is 3 days post-op, s/p: right CEA   -Patient is stable for d/c today, orders have been placed -Continue on aspirin '81mg'$ , zetia '10mg'$  and crestor '40mg'$  daily -Neck incision is c/d/l with minimal swelling, no hematoma -Pain is well controlled -Patient's mobility has improved and he is eating a normal diet -Patient still requires 1-2L O2 via nasal cannula  while at rest and ambulating to keep sats above 90%. Orders have been placed for [facility] and home oxygen  -Patient will continue to try to wean off oxygen at home and use incentive spirometry -Patient will follow up in clinic in 2-3 weeks for incision check  Vicente Serene, PA-C [facility] [phone] 06/15/2021 9:48 AM  I have independently interviewed and examined patient and agree with PA assessment and plan above. [place] for home with O2 today.  Harbour Nordmeyer C. Donzetta Matters, MD [facility] [place] Office: [phone] Pager: [phone]

## 2021-06-15 NOTE — Telephone Encounter (Signed)
Carotid study has been completed

## 2021-06-15 NOTE — Care Management Important Message (Signed)
Important Message  Patient Details  Name: James Wolf MRN: [id number] Date of Birth: [birth year]   Medicare Important Message Given:  Yes     Shelda Altes 06/15/2021, 9:23 AM

## 2021-06-15 NOTE — Plan of Care (Signed)
  Problem: Education: Goal: Ability to describe self-care measures that may prevent or decrease complications (Diabetes Survival Skills Education) will improve Outcome: Adequate for Discharge Goal: Individualized Educational Video(s) Outcome: Adequate for Discharge   Problem: Coping: Goal: Ability to adjust to condition or change in health will improve Outcome: Adequate for Discharge   Problem: Fluid Volume: Goal: Ability to maintain a balanced intake and output will improve Outcome: Adequate for Discharge   Problem: Health Behavior/Discharge Planning: Goal: Ability to identify and utilize available resources and services will improve Outcome: Adequate for Discharge Goal: Ability to manage health-related needs will improve Outcome: Adequate for Discharge   Problem: Metabolic: Goal: Ability to maintain appropriate glucose levels will improve Outcome: Adequate for Discharge   Problem: Nutritional: Goal: Maintenance of adequate nutrition will improve Outcome: Adequate for Discharge Goal: Progress toward achieving an optimal weight will improve Outcome: Adequate for Discharge   Problem: Skin Integrity: Goal: Risk for impaired skin integrity will decrease Outcome: Adequate for Discharge   Problem: Tissue Perfusion: Goal: Adequacy of tissue perfusion will improve Outcome: Adequate for Discharge   Problem: Education: Goal: Knowledge of General Education information will improve Description: Including pain rating scale, medication(s)/side effects and non-pharmacologic comfort measures Outcome: Adequate for Discharge   Problem: Health Behavior/Discharge Planning: Goal: Ability to manage health-related needs will improve Outcome: Adequate for Discharge   Problem: Clinical Measurements: Goal: Ability to maintain clinical measurements within normal limits will improve Outcome: Adequate for Discharge Goal: Will remain free from infection Outcome: Adequate for Discharge Goal:  Diagnostic test results will improve Outcome: Adequate for Discharge Goal: Respiratory complications will improve Outcome: Adequate for Discharge Goal: Cardiovascular complication will be avoided Outcome: Adequate for Discharge   Problem: Activity: Goal: Risk for activity intolerance will decrease Outcome: Adequate for Discharge   Problem: Nutrition: Goal: Adequate nutrition will be maintained Outcome: Adequate for Discharge   Problem: Coping: Goal: Level of anxiety will decrease Outcome: Adequate for Discharge   Problem: Elimination: Goal: Will not experience complications related to bowel motility Outcome: Adequate for Discharge Goal: Will not experience complications related to urinary retention Outcome: Adequate for Discharge   Problem: Pain Managment: Goal: General experience of comfort will improve Outcome: Adequate for Discharge   Problem: Safety: Goal: Ability to remain free from injury will improve Outcome: Adequate for Discharge   Problem: Skin Integrity: Goal: Risk for impaired skin integrity will decrease Outcome: Adequate for Discharge   Problem: Education: Goal: Knowledge of secondary prevention will improve ([facility] ALL) Outcome: Adequate for Discharge

## 2021-06-15 NOTE — Progress Notes (Signed)
Mobility Specialist: Progress Note   06/15/21 1135  Mobility  Activity Refused mobility   Attempted to see pt x3. Pt with c/o nausea and requesting to come back. Upon return pt refused stating he just walked to the BR and is feeling tired.   [facility] [employer] Crear Mobility Specialist Mobility Specialist 4 East: [phone]

## 2021-06-15 NOTE — TOC Transition Note (Signed)
Transition of Care (TOC) - CM/SW Discharge Note Marvetta Gibbons RN, BSN Transitions of Care Unit 4E- RN Case Manager See Treatment Team for direct phone #    Patient Details  Name: James Wolf MRN: [id number] Date of Birth: 25-Apr-1950  Transition of Care [facility]) CM/SW Contact:  Dawayne Patricia, RN Phone Number: 06/15/2021, 10:34 AM   Clinical Narrative:    Pt stable for transition home today w/ wife. Wife to transport home and is at the bedside.  Orders placed for HHRN/RT and DME- home 02.  CM in to speak with pt and wife  to discuss [facility] and DME needs.  Per conversation with pt he has been on home 02 in past- but not currently. Does not remember name of agency and is open to using in house provider for current home 02 needs- referral called to [employer] for home 02 needs- and they will deliver portable tank for transport home once processed and approved.   List provided to pt and wife for [facility] choice Per CMS guidelines from medicare.gov website with star ratings (copy placed in shadow chart), after review they have selected [facility] (now known as [facility]). Call made to [facility] and spoke with Myriam Jacobson for [facility] needs- per Myriam Jacobson they do not have RT available but can do start of care for nursing on Monday 6/12. Referral has been accepted and info faxed to [facility] to fax # [phone].      Final next level of care: [place] Barriers to Discharge: No Barriers Identified   Patient Goals and CMS Choice Patient states their goals for this hospitalization and ongoing recovery are:: return home CMS Medicare.gov Compare Post Acute Care list provided to:: Patient Choice offered to / list presented to : Patient  Discharge Placement               Home w/ [facility]        Discharge Plan and Services   Discharge Planning Services: CM Consult Post Acute Care Choice: Durable Medical Equipment, Home Health          DME Arranged: Oxygen DME Agency:  [facility] Date DME Agency Contacted: 06/15/21 Time DME Agency Contacted: 1000 Representative spoke with at DME Agency: Thedore Mins HH Arranged: RN, Disease Management, [place] Agency: [place] Date [place]: 06/15/21 Time [place]: 1034 Representative spoke with at [place]: [place] ([place]) Interventions     Readmission Risk Interventions    06/15/2021   10:34 AM 10/16/2020    8:50 AM  Readmission Risk Prevention Plan  Post Dischage Appt Complete   Medication Screening Complete   Transportation Screening Complete Complete  HRI or Home Care Consult  Complete  Social Work Consult for [place] Planning/Counseling  Complete  Palliative Care Screening  Not Applicable  Medication Review Press photographer)  Complete

## 2021-06-15 NOTE — Progress Notes (Signed)
SATURATION QUALIFICATIONS: (This note is used to comply with regulatory documentation for home oxygen)  Patient Saturations on Room Air at Rest =86%   

## 2021-06-19 ENCOUNTER — Telehealth: Payer: Self-pay

## 2021-06-21 NOTE — Discharge Summary (Signed)
Discharge Summary     James Wolf [birth year] 71 y.o. male  [id number]  Admission Date: 06/12/2021  Discharge Date: 06/15/21  Physician: Waynetta Sandy MD  Admission Diagnosis: Carotid artery stenosis [I65.29]  Discharge Diagnosis: Carotid artery stenosis [facility]  Hospital Course:  The patient was admitted to the hospital and taken to the operating room on 06/12/2021 and underwent right carotid endarterectomy.  The pt tolerated the procedure well and was transported to the PACU in good condition.  By POD 1, the pt's neuro status was intact.  He developed some substernal chest pain overnight, so an EKG was ordered and he was determined to be in normal sinus rhythm.  He was given morphine for the pain, and by POD 2 his chest pain was gone.  During hospitalization the patient required 2 L of supplemental oxygen at baseline and during activity, or his oxygen saturation would drop below 90%.  Due to concern for aspiration a CXR was ordered on POD 2, and findings were consistent for aspiration.  However the patient was afebrile with no leukocytosis, so antibiotics were not given at that time.   The remainder of the hospital course consisted of increasing mobilization and increasing intake of solids without difficulty.  The patient was discharged home on 06/15/2021, POD 3, with orders for home oxygen.  The patient was to attempt to wean off oxygen at home over time, with the help of a home health nurse.   Discharge Instructions     Call MD for:  redness, tenderness, or signs of infection (pain, swelling, redness, odor or green/yellow discharge around incision site)   Complete by: As directed    Call MD for:  severe uncontrolled pain   Complete by: As directed    Call MD for:  temperature >100.4   Complete by: As directed    Diet - low sodium heart healthy   Complete by: As directed    Discharge wound care:   Complete by: As directed    Keep clean, dry, and intact  until office visit. Do not soak in bathtub.   Increase activity slowly   Complete by: As directed        Discharge Diagnosis:  Carotid artery stenosis [I65.29]  Secondary Diagnosis: Patient Active Problem List   Diagnosis Date Noted   Carotid artery stenosis 06/12/2021   Dyspnea on exertion 01/18/2021   Impaired fasting glucose 01/18/2021   Anemia 09/19/2020   At risk for falls 09/19/2020   Left shoulder pain 09/19/2020   Kidney stone 09/19/2020   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   Osteoporosis 09/19/2020   Former smoker 09/19/2020   Left upper lobe pulmonary nodule 06/24/2020   Prolonged QT interval 06/24/2020   Aspiration pneumonia ([place]) 06/24/2020   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   Left main coronary artery disease 05/31/2020   Hyperlipidemia with target LDL less than 70 05/31/2020   Leukocytosis 05/30/2020   Hypokalemia 05/30/2020   GERD (gastroesophageal reflux disease) 05/30/2020   Coronary artery disease involving native coronary artery of native heart with unstable angina pectoris ([place]) 05/30/2020   Non-ST elevation (NSTEMI) myocardial infarction [facility])    Chest pain 05/29/2020   S/P left rotator cuff repair 10/26/19 11/09/2019   Nontraumatic incomplete tear of left rotator cuff    Essential hypertension 07/19/2017   COPD (chronic obstructive pulmonary disease) ([place]) 07/19/2017   History of esophageal cancer 07/19/2017   Acute respiratory failure with hypoxia ([place]) 07/19/2017   Aspiration pneumonitis ([place]) 07/19/2017   Syncope 07/19/2017  IGT (impaired glucose tolerance) 12/21/2013   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 s/p CABG 05/2020.   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         Medication List     TAKE these medications    acetaminophen 500 MG tablet Commonly known as: TYLENOL Take 1,000 mg by mouth every 6 (six) hours as needed for moderate pain.   albuterol 108 (90 Base) MCG/ACT inhaler Commonly known as: VENTOLIN HFA Inhale 1-2 puffs into the lungs every 6 (six) hours as needed for wheezing or shortness of breath.   aspirin EC 81 MG tablet Take 1 tablet (81 mg total) by mouth daily. Swallow whole.   cholecalciferol 25 MCG (1000 UNIT) tablet Commonly known as: VITAMIN D3 Take 1,000 Units by mouth 2 (two) times daily.   diclofenac sodium 1 % Gel Commonly known as: VOLTAREN Apply 2 g topically 2 (two) times daily as needed (pain).   esomeprazole 40 MG capsule Commonly known as: NEXIUM Take 40 mg by mouth 2 (two) times daily.   ezetimibe 10 MG tablet Commonly known as: ZETIA TAKE 1 TABLET BY MOUTH EVERY DAY   glipiZIDE 2.5 MG 24 hr tablet Commonly known as: GLUCOTROL XL Take 2.5 mg by mouth daily with breakfast.   hydrocortisone 2.5 % cream Apply 1 application. topically 2 (two) times daily as needed for rash.   ketoconazole 2 % cream Commonly known as: NIZORAL Apply 1 application. topically 2 (two) times daily as needed for irritation.   lisinopril 2.5 MG tablet Commonly known as: ZESTRIL Take 2.5 mg by mouth daily.    metoprolol tartrate 100 MG tablet Commonly known as: LOPRESSOR Take 0.5 tablets (50 mg total) by mouth 2 (two) times daily.   oxyCODONE-acetaminophen 5-325 MG tablet Commonly known as: PERCOCET/ROXICET Take 1 tablet by mouth every 6 (six) hours as needed for severe pain.   rosuvastatin 40 MG tablet Commonly known as: CRESTOR Take 1 tablet (40 mg total) by mouth daily at 6 PM.   tamsulosin 0.4 MG Caps capsule Commonly known as: FLOMAX Take 0.4 mg by mouth daily as needed (kidney stones).   VITAMIN B COMPLEX PO Take 1 tablet by mouth 2 (two) times daily.               Discharge Care Instructions  (From admission, onward)  Start     Ordered   06/15/21 0000  Discharge wound care:       Comments: Keep clean, dry, and intact until office visit. Do not soak in bathtub.   06/15/21 0749             Discharge Instructions:   [facility] Discharge Instructions Carotid Endarterectomy (CEA)  Please refer to the following instructions for your post-procedure care. Your surgeon or physician assistant will discuss any changes with you.  Activity  You are encouraged to walk as much as you can. You can slowly return to normal activities but must avoid strenuous activity and heavy lifting until your doctor tell you it's OK. Avoid activities such as vacuuming or swinging a golf club. You can drive after one week if you are comfortable and you are no longer taking prescription pain medications. It is normal to feel tired for serval weeks after your surgery. It is also normal to have difficulty with sleep habits, eating, and bowel movements after surgery. These will go away with time.  Bathing/Showering  You may shower after you come home. Do not soak in a bathtub, hot tub, or swim until the incision heals completely.  Incision Care  Shower every day. Clean your incision with mild soap and water. Pat the area dry with a clean towel.  You do not need a bandage unless otherwise instructed. Do not apply any ointments or creams to your incision. You may have skin glue on your incision. Do not peel it off. It will come off on its own in about one week. Your incision may feel thickened and raised for several weeks after your surgery. This is normal and the skin will soften over time. For Men Only: It's OK to shave around the incision but do not shave the incision itself for 2 weeks. It is common to have numbness under your chin that could last for several months.  Diet  Resume your normal diet. There are no special food restrictions following this procedure. A low fat/low cholesterol diet is recommended for all patients with vascular disease. In order to heal from your surgery, it is CRITICAL to get adequate nutrition. Your body requires vitamins, minerals, and protein. Vegetables are the best source of vitamins and minerals. Vegetables also provide the perfect balance of protein. Processed food has little nutritional value, so try to avoid this.  Medications  Resume taking all of your medications unless your doctor or physician assistant tells you not to.  If your incision is causing pain, you may take over-the- counter pain relievers such as acetaminophen (Tylenol). If you were prescribed a stronger pain medication, please be aware these medications can cause nausea and constipation.  Prevent nausea by taking the medication with a snack or meal. Avoid constipation by drinking plenty of fluids and eating foods with a high amount of fiber, such as fruits, vegetables, and grains. Do not take Tylenol if you are taking prescription pain medications.  Follow Up  Our office will schedule a follow up appointment 2-3 weeks following discharge.  Please call us immediately for any of the following conditions  Increased pain, redness, drainage (pus) from your incision site. Fever of 101 degrees or higher. If you should develop stroke (slurred  speech, difficulty swallowing, weakness on one side of your body, loss of vision) you should call 911 and go to the nearest emergency room.  Reduce your risk of vascular disease:  Stop smoking. If you would  like help call [facility] at 1-800-QUIT-NOW 205-037-9220) or [place] at [phone]. Manage your cholesterol Maintain a desired weight Control your diabetes Keep your blood pressure down  If you have any questions, please call the office at [phone].  Disposition: Good  Patient's condition: is Good  Follow up: 1. [facility] in 2-3 weeks for incision check   Vicente Serene, PA-C [facility] [phone]   --- For [facility] Registry use ---   Modified Rankin score at D/C (0-6): 0  IV medication needed for:  1. Hypertension: No 2. Hypotension: No  Post-op Complications: No  1. Post-op CVA or TIA: No  2. CN injury: No  3. Myocardial infarction: No  4.  CHF: No  5.  Dysrhythmia (new): No  6. Wound infection: No  7. Reperfusion symptoms: No  8. Return to OR: No   Discharge medications: Statin use:  Yes ASA use:  Yes   Beta blocker use:  Yes ACE-Inhibitor use:  Yes  ARB use:  No CCB use: No P2Y12 Antagonist use: No, '[ ]'$  Plavix, '[ ]'$  Plasugrel, '[ ]'$  Ticlopinine, '[ ]'$  Ticagrelor, '[ ]'$  Other, '[ ]'$  No for medical reason, '[ ]'$  Non-compliant, [x ] Not-indicated Anti-coagulant use:  No, '[ ]'$  Warfarin, '[ ]'$  Rivaroxaban, '[ ]'$  Dabigatran,

## 2021-06-28 NOTE — Progress Notes (Signed)
POST OPERATIVE OFFICE NOTE    CC:  F/u for surgery  HPI:  This is a 72 y.o. male who is s/p right carotid endarterectomy with patch angioplasty on 6/6 by Dr. Donzetta Matters. This was performed for asymptomatic high grade ICA stenosis.   The patient is here today for an incision check post-op. He reports he is doing great since surgery with no issues. He occasionally feels some stiffness in his neck when moving his head side to side, but most of his pain has gone away. He still has some numbness around the incision site. He denies any drainage from the incision, fevers, slurred speech, changes to vision, chest pain, hoarseness, or shortness of breath.  The only change to his health he reports is having to get iron infusions starting late July, with his hematologist at [facility]. The patient reports that his anemia has gotten worse over the years due to his esophagitis.  Allergies  Allergen Reactions   Zithromax [Azithromycin] Diarrhea    Causes pt's stomach to "tear up" does not want to take medication again.     Current Outpatient Medications  Medication Sig Dispense Refill   acetaminophen (TYLENOL) 500 MG tablet Take 1,000 mg by mouth every 6 (six) hours as needed for moderate pain.     albuterol (VENTOLIN HFA) 108 (90 Base) MCG/ACT inhaler Inhale 1-2 puffs into the lungs every 6 (six) hours as needed for wheezing or shortness of breath.     aspirin EC 81 MG EC tablet Take 1 tablet (81 mg total) by mouth daily. Swallow whole. 30 tablet 11   B Complex Vitamins (VITAMIN B COMPLEX PO) Take 1 tablet by mouth 2 (two) times daily.     cholecalciferol (VITAMIN D3) 25 MCG (1000 UNIT) tablet Take 1,000 Units by mouth 2 (two) times daily.     diclofenac sodium (VOLTAREN) 1 % GEL Apply 2 g topically 2 (two) times daily as needed (pain).      esomeprazole (NEXIUM) 40 MG capsule Take 40 mg by mouth 2 (two) times daily.      ezetimibe (ZETIA) 10 MG tablet TAKE 1 TABLET BY MOUTH EVERY DAY 90 tablet 1   glipiZIDE  (GLUCOTROL XL) 2.5 MG 24 hr tablet Take 2.5 mg by mouth daily with breakfast.     hydrocortisone 2.5 % cream Apply 1 application. topically 2 (two) times daily as needed for rash.     ketoconazole (NIZORAL) 2 % cream Apply 1 application. topically 2 (two) times daily as needed for irritation.     lisinopril (ZESTRIL) 2.5 MG tablet Take 2.5 mg by mouth daily.     metoprolol tartrate (LOPRESSOR) 100 MG tablet Take 0.5 tablets (50 mg total) by mouth 2 (two) times daily. 90 tablet 1   oxyCODONE-acetaminophen (PERCOCET/ROXICET) 5-325 MG tablet Take 1 tablet by mouth every 6 (six) hours as needed for severe pain. 12 tablet 0   rosuvastatin (CRESTOR) 40 MG tablet Take 1 tablet (40 mg total) by mouth daily at 6 PM. 90 tablet 1   tamsulosin (FLOMAX) 0.4 MG CAPS capsule Take 0.4 mg by mouth daily as needed (kidney stones).     No current facility-administered medications for this visit.     ROS:  See HPI  Physical Exam:  There were no vitals filed for this visit.  Incision:  R neck incision looks good. Skin is intact with no swelling, drainage, or hematoma. Extremities:  Palpable 2+ radial pulses. Equal grip strength bilaterally Neuro: No slurred speech, no tongue deviation. Gait  is normal.   Assessment/Plan:  This is a 70 y.o. male who is s/p: right carotid endarterectomy with patch angioplasty on 6/6 by Dr. Donzetta Matters. This was performed for asymptomatic high grade ICA stenosis.   -Patient has some residual numbness around the incision site, and minimal occasional pain. The incision site has healed well with no issues. -No symptoms of stroke or TIA such as slurred speech, weakness or numbness of the body, or changes to vision. Patient is neuro intact. -Has known left ICA stenosis of 40-59% stenosis. Will continue to monitor this on repeat duplex follow up -Continue Aspirin and statin  -He will follow up in 9 months with bilateral carotid duplex  Vicente Serene, PA-C [facility] [phone]  Clinic MD:  [place]/ Donzetta Matters

## 2021-07-04 ENCOUNTER — Ambulatory Visit (INDEPENDENT_AMBULATORY_CARE_PROVIDER_SITE_OTHER): Payer: Medicare Other | Admitting: Physician Assistant

## 2021-07-04 VITALS — BP 105/63 | HR 69 | Temp 98.0°F | Resp 18 | Ht 65.0 in | Wt 160.2 lb

## 2021-07-04 DIAGNOSIS — I6521 Occlusion and stenosis of right carotid artery: Secondary | ICD-10-CM

## 2021-07-12 ENCOUNTER — Other Ambulatory Visit: Payer: Self-pay

## 2021-07-12 DIAGNOSIS — I779 Disorder of arteries and arterioles, unspecified: Secondary | ICD-10-CM

## 2021-07-12 NOTE — Telephone Encounter (Signed)
Appt scheduled and completed.

## 2021-07-17 ENCOUNTER — Other Ambulatory Visit: Payer: Self-pay | Admitting: Cardiology

## 2021-09-20 ENCOUNTER — Telehealth: Payer: Self-pay

## 2021-09-20 NOTE — Telephone Encounter (Signed)
   Pre-operative Risk Assessment    Patient Name: James Wolf  DOB: 01-Oct-1950 MRN: [id number]      Request for Surgical Clearance    Procedure:   Left Shoulder scope with RCR, biceps tenodesis  Date of Surgery:  Clearance TBD                                 Surgeon:  Dr Victorino December  Surgeon's Group or Practice Name:  [facility]  Phone number:  [phone] Fax number:  [phone] Marianna Fuss [place])   Type of Clearance Requested:   - Medical  - Pharmacy:  Hold Clopidogrel (Plavix)     Type of Anesthesia:   Choice    Additional requests/questions:   n/a  Grace Isaac, Yonah Tangeman T   09/20/2021, 1:04 PM

## 2021-09-21 NOTE — Telephone Encounter (Signed)
He has an appointment on 10/03/2021 with Dr. Harl Bowie.  Per office notes, patient was cleared to stop Plavix May 2023.  He was advised to continue low-dose aspirin. I am removing from preop pool since patient has an upcoming office visit at which time clearance can be addressed.  Emmaline Life, NP-C  09/21/2021, 1:33 PM [address] Office [phone] Fax [phone]

## 2021-10-03 ENCOUNTER — Encounter: Payer: Self-pay | Admitting: Cardiology

## 2021-10-03 ENCOUNTER — Ambulatory Visit: Payer: Medicare Other | Attending: Cardiology | Admitting: Cardiology

## 2021-10-03 VITALS — BP 116/84 | HR 60 | Ht 65.5 in | Wt 172.6 lb

## 2021-10-03 DIAGNOSIS — Z01810 Encounter for preprocedural cardiovascular examination: Secondary | ICD-10-CM | POA: Diagnosis present

## 2021-10-03 DIAGNOSIS — E782 Mixed hyperlipidemia: Secondary | ICD-10-CM | POA: Diagnosis present

## 2021-10-03 DIAGNOSIS — I6523 Occlusion and stenosis of bilateral carotid arteries: Secondary | ICD-10-CM | POA: Diagnosis present

## 2021-10-03 DIAGNOSIS — I2510 Atherosclerotic heart disease of native coronary artery without angina pectoris: Secondary | ICD-10-CM | POA: Insufficient documentation

## 2021-10-03 NOTE — Progress Notes (Signed)
Clinical Summary James Wolf is a 71 y.o.male seen today for follow up of the following medical problems.    1.CAD - admit 05/2020 with NSTEMI - cardiac catheterization by Dr. Martinique with critical left main and multivessel CAD. He underwent CABG x3 (LIMA-LAD, SVG-OM, SVG-distal RCA)   - no chest pains, no SOB/DOE - compliant with meds   2. HTN -compliant with meds     3. Hyperlipidemia - compliant with meds - recent labs with pcp   4. Carotid stenosis - 05/2020 RICA 60-79%, mild RICA 77/9390 RICA 30-09%, LICA 23-30% - 0/7622 right CEA     5.Preoperative evaluation - considering shoulder surgery - can walk up 1-2 flights of stairs daily with signifaicant symptoms.      6. COPD     7. Prior bariartic surgery   8. Hyperlipidemia - 10/2020 TC 78 TG 55 HDL 46 LDL 21 - he is on crestor, zetia   9.Syncope - ER evaluation 07/2021 -from EMS eval low bp's and HRs -episode after iron infusion thought to be reaction  - no recurrent episodes.       Past Medical History:  Diagnosis Date   ADHD (attention deficit hyperactivity disorder)    Anemia    Aspiration pneumonia (HCC)    Asthma    exersise induced   CAD (coronary artery disease)    a. s/p CABG 05/2020.   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      Current Outpatient Medications  Medication Sig Dispense Refill    acetaminophen (TYLENOL) 500 MG tablet Take 1,000 mg by mouth every 6 (six) hours as needed for moderate pain.     albuterol (VENTOLIN HFA) 108 (90 Base) MCG/ACT inhaler Inhale 1-2 puffs into the lungs every 6 (six) hours as needed for wheezing or shortness of breath.     aspirin EC 81 MG EC tablet Take 1 tablet (81 mg total) by mouth daily. Swallow whole. 30 tablet 11   B Complex Vitamins (VITAMIN B COMPLEX PO) Take 1 tablet by mouth 2 (two) times daily.     cholecalciferol (VITAMIN D3) 25 MCG (1000 UNIT) tablet Take 1,000 Units by mouth 2 (two) times daily.     diclofenac sodium (VOLTAREN) 1 % GEL Apply 2 g topically 2 (two) times daily as needed (pain).      esomeprazole (NEXIUM) 40 MG capsule Take 40 mg by mouth 2 (two) times daily.      ezetimibe (ZETIA) 10 MG tablet TAKE 1 TABLET BY MOUTH EVERY DAY 90 tablet 1   glipiZIDE (GLUCOTROL XL) 2.5 MG 24 hr tablet Take 2.5 mg by mouth daily with breakfast.     hydrocortisone 2.5 % cream Apply 1 application. topically 2 (  two) times daily as needed for rash.     ketoconazole (NIZORAL) 2 % cream Apply 1 application. topically 2 (two) times daily as needed for irritation.     lisinopril (ZESTRIL) 2.5 MG tablet Take 2.5 mg by mouth daily.     metoprolol tartrate (LOPRESSOR) 100 MG tablet Take 1/2 (one-half) tablet by mouth twice daily 90 tablet 1   rosuvastatin (CRESTOR) 40 MG tablet TAKE 1 TABLET BY MOUTH ONCE DAILY AT 6 PM 90 tablet 1   tamsulosin (FLOMAX) 0.4 MG CAPS capsule Take 0.4 mg by mouth daily as needed (kidney stones).     No current facility-administered medications for this visit.     Past Surgical History:  Procedure Laterality Date   COLONOSCOPY WITH ESOPHAGOGASTRODUODENOSCOPY (EGD)     CORONARY ARTERY BYPASS GRAFT N/A 06/02/2020   Procedure: CORONARY ARTERY BYPASS GRAFTING (CABG) TIMES THREE USING RIGHT GREATER SAPHENOUS VEIN HARVESTED ENDOSCOPICALLY AND LEFT INTERNAL MAMMARY ARTERY.;  Surgeon: Rexene Alberts, MD;  Location: [facility];  Service: Open Heart Surgery;  Laterality: N/A;   CORONARY ARTERY BYPASS GRAFT  06/02/2020   CYSTOSCOPY/RETROGRADE/URETEROSCOPY/STONE EXTRACTION WITH BASKET     ENDARTERECTOMY Right 06/12/2021   Procedure: RIGHT CAROTID ENDARTERECTOMY WITH PATCH ANGIOPLASTY;  Surgeon: Waynetta Sandy, MD;  Location: [place];  Service: Vascular;  Laterality: Right;   ESOPHAGECTOMY     Ivor-Lewis esophagectomy 1995 [facility]   IR THORACENTESIS ASP PLEURAL SPACE W/IMG GUIDE  06/06/2020   LEFT HEART CATH AND CORONARY ANGIOGRAPHY N/A 05/30/2020   Procedure: LEFT HEART CATH AND CORONARY ANGIOGRAPHY;  Surgeon: Martinique, Peter M, MD;  Location: [place] [facility];  Service: Cardiovascular;  Laterality: N/A;   partial esophageal ejectory     SHOULDER OPEN ROTATOR CUFF REPAIR Left 10/26/2019   Procedure: ROTATOR CUFF REPAIR SHOULDER OPEN LEFT;  Surgeon: [name], MD;  Location: [facility];  Service: Orthopedics;  Laterality: Left;     Allergies  Allergen Reactions   Zithromax [Azithromycin] Diarrhea    Causes pt's stomach to "tear up" does not want to take medication again.       Family History  Problem Relation Age of Onset   Heart disease Mother    CAD Father    CVA Father      Social History Mr. Montenegro reports that he quit smoking about 28 years ago. His smoking use included cigarettes. He has a 25.00 pack-year smoking history. He has never used smokeless tobacco. Mr. Friedt reports that he does not currently use alcohol.   Review of Systems CONSTITUTIONAL: No weight loss, fever, chills, weakness or fatigue.  HEENT: Eyes: No visual loss, blurred vision, double vision or yellow sclerae.No hearing loss, sneezing, congestion, runny nose or sore throat.  SKIN: No rash or itching.  CARDIOVASCULAR: per hpi RESPIRATORY: No shortness of breath, cough or sputum.  GASTROINTESTINAL: No anorexia, nausea, vomiting or diarrhea. No abdominal pain or blood.  GENITOURINARY: No burning on urination, no  polyuria NEUROLOGICAL: No headache, dizziness, syncope, paralysis, ataxia, numbness or tingling in the extremities. No change in bowel or bladder control.  MUSCULOSKELETAL: +shoulder pains LYMPHATICS: No enlarged nodes. No history of splenectomy.  PSYCHIATRIC: No history of depression or anxiety.  ENDOCRINOLOGIC: No reports of sweating, cold or heat intolerance. No polyuria or polydipsia.  Marland Kitchen   Physical Examination Today's Vitals   10/03/21 1427  BP: 116/84  Pulse: 60  SpO2: 97%  Weight: 172 lb 9.6 oz (78.3 kg)  Height: 5' 5.5" (1.664 m)   Body mass  index is 28.29 kg/m.  Gen: resting comfortably, no acute distress HEENT: no scleral icterus, pupils equal round and reactive, no palptable cervical adenopathy,  CV: RRR, no m/r/g no jvd Resp: Clear to auscultation bilaterally GI: abdomen is soft, non-tender, non-distended, normal bowel sounds, no hepatosplenomegaly MSK: extremities are warm, no edema.  Skin: warm, no rash Neuro:  no focal deficits Psych: appropriate affect   Diagnostic Studies     Assessment and Plan  CAD - no symptoms, continue curren tmeds   2. Carotid stenosis - s/p CEA, continue medical therapy -continue to follow with vascular   3. Hyperlipidemia - request pcp labs, continue current meds  4. Preoperative evaluation - tolerates great than 4 METs without symptoms - remains ok to proceed with surgery.       Arnoldo Lenis, M.D.

## 2021-10-03 NOTE — Patient Instructions (Signed)
Medication Instructions:  Continue all current medications.   Labwork: none  Testing/Procedures: none  Follow-Up: 6 months   Any Other Special Instructions Will Be Listed Below (If Applicable).   If you need a refill on your cardiac medications before your next appointment, please call your pharmacy.  

## 2021-10-10 ENCOUNTER — Encounter: Payer: Self-pay | Admitting: *Deleted

## 2021-10-18 NOTE — Telephone Encounter (Signed)
   Patient Name: James Wolf  DOB: 08-Jun-1950 MRN: [id number]  Primary Cardiologist: Carlyle Dolly, MD  Chart reviewed as part of pre-operative protocol coverage. Pre-op clearance already addressed by colleagues in earlier phone notes. To summarize recommendations:  -. Preoperative evaluation - tolerates great than 4 METs without symptoms remains ok to proceed with surgery.  -Dr. Harl Bowie  Per office protocol, okay to hold aspirin x7 days prior to the procedure.  Please restart medically safe to do so.  No longer taking Plavix.  Will route this bundled recommendation to requesting provider via Epic fax function and remove from pre-op pool. Please call with questions.  Elgie Collard, PA-C 10/18/2021, 11:52 AM

## 2021-10-29 NOTE — Progress Notes (Addendum)
COVID Vaccine Completed: yes  Date of COVID positive in last 90 days: no  PCP - Sherrilee Gilles, DO Cardiologist - Carlyle Dolly, MD  Cardiac clearance by Carlyle Dolly 10/03/21 in Epic/chart  Chest x-ray - 06/14/21 Epic EKG - 06/13/21 Epic Stress Test - n/a ECHO - 10/16/20 Epic Cardiac Cath - 05/30/20 Epic Pacemaker/ICD device last checked: no Spinal Cord Stimulator: n/a  Bowel Prep - no  Sleep Study - n/a CPAP -   Fasting Blood Sugar - preDM Checks Blood Sugar when symptomatic   Blood Thinner Instructions: stopped Plavix in May 2023 Aspirin Instructions: ASA 81, hold 7 days Last Dose:  Activity level: Can go up a flight of stairs and perform activities of daily living without stopping and without symptoms of chest pain or shortness of breath.     Anesthesia review: HTN, CAD, NSTEMI, COPD, puml nodule, CABG x3  Patient denies shortness of breath, fever, cough and chest pain at PAT appointment  Patient verbalized understanding of instructions that were given to them at the PAT appointment. Patient was also instructed that they will need to review over the PAT instructions again at home before surgery.

## 2021-10-30 ENCOUNTER — Encounter (HOSPITAL_COMMUNITY): Payer: Self-pay

## 2021-10-30 ENCOUNTER — Encounter (HOSPITAL_COMMUNITY)
Admission: RE | Admit: 2021-10-30 | Discharge: 2021-10-30 | Disposition: A | Discharge status: Home / Self Care | Payer: Medicare Other | Source: Ambulatory Visit | Attending: Orthopedic Surgery | Admitting: Orthopedic Surgery

## 2021-10-30 VITALS — BP 118/82 | HR 60 | Temp 97.7°F | Resp 18 | Ht 65.0 in | Wt 167.5 lb

## 2021-10-30 DIAGNOSIS — R7303 Prediabetes: Secondary | ICD-10-CM | POA: Diagnosis not present

## 2021-10-30 DIAGNOSIS — Z01812 Encounter for preprocedural laboratory examination: Secondary | ICD-10-CM | POA: Diagnosis present

## 2021-10-30 DIAGNOSIS — J449 Chronic obstructive pulmonary disease, unspecified: Secondary | ICD-10-CM | POA: Insufficient documentation

## 2021-10-30 DIAGNOSIS — I25110 Atherosclerotic heart disease of native coronary artery with unstable angina pectoris: Secondary | ICD-10-CM | POA: Diagnosis not present

## 2021-10-30 LAB — BASIC METABOLIC PANEL
Anion gap: 8 (ref 5–15)
BUN: 23 mg/dL (ref 8–23)
CO2: 26 mmol/L (ref 22–32)
Calcium: 9.1 mg/dL (ref 8.9–10.3)
Chloride: 108 mmol/L (ref 98–111)
Creatinine, Ser: 0.92 mg/dL (ref 0.61–1.24)
GFR, Estimated: >60 mL/min (ref >60)
Glucose, Bld: 135 mg/dL — ABNORMAL HIGH (ref 70–99)
Potassium: 4.3 mmol/L (ref 3.5–5.1)
Sodium: 142 mmol/L (ref 135–145)

## 2021-10-30 LAB — CBC
HCT: 41.2 % (ref 39.0–52.0)
Hemoglobin: 13.1 g/dL (ref 13.0–17.0)
MCH: 29.6 pg (ref 26.0–34.0)
MCHC: 31.8 g/dL (ref 30.0–36.0)
MCV: 93.2 fL (ref 80.0–100.0)
Platelets: 154 10*3/uL (ref 150–400)
RBC: 4.42 MIL/uL (ref 4.22–5.81)
RDW: 17.1 % — ABNORMAL HIGH (ref 11.5–15.5)
WBC: 6.2 10*3/uL (ref 4.0–10.5)
nRBC: 0 % (ref 0.0–0.2)

## 2021-10-30 LAB — HEMOGLOBIN A1C
Hgb A1c MFr Bld: 6 % — ABNORMAL HIGH (ref 4.8–5.6)
Mean Plasma Glucose: 125.5 mg/dL

## 2021-10-30 LAB — GLUCOSE, CAPILLARY: Glucose-Capillary: 142 mg/dL — ABNORMAL HIGH (ref 70–99)

## 2021-10-30 NOTE — Patient Instructions (Signed)
SURGICAL WAITING ROOM VISITATION Patients having surgery or a procedure may have no more than 2 support people in the waiting area - these visitors may rotate.   Children under the age of 62 must have an adult with them who is not the patient. If the patient needs to stay at the hospital during part of their recovery, the visitor guidelines for inpatient rooms apply. Pre-op nurse will coordinate an appropriate time for 1 support person to accompany patient in pre-op.  This support person may not rotate.    Please refer to the [facility] website for the visitor guidelines for Inpatients (after your surgery is over and you are in a regular room).    Your procedure is scheduled on: 11/09/21   Report to [facility] Main Entrance    Report to admitting at 1:20 PM   Call this number if you have problems the morning of surgery [phone]   Do not eat food :After Midnight.   After Midnight you may have the following liquids until 12:35 PM DAY OF SURGERY  Water Non-Citrus Juices (without pulp, NO RED) Carbonated Beverages Black Coffee (NO MILK/CREAM OR CREAMERS, sugar ok)  Clear Tea (NO MILK/CREAM OR CREAMERS, sugar ok) regular and decaf                             Plain Jell-O (NO RED)                                           Fruit ices (not with fruit pulp, NO RED)                                     Popsicles (NO RED)                                                               Sports drinks like Gatorade (NO RED)                  The day of surgery:  Drink ONE (1) Pre-Surgery G2 at 12:35 PM the morning of surgery. Drink in one sitting. Do not sip.  This drink was given to you during your hospital  pre-op appointment visit. Nothing else to drink after completing the  Pre-Surgery G2.          If you have questions, please contact your surgeon's office.   FOLLOW BOWEL PREP AND ANY ADDITIONAL PRE OP INSTRUCTIONS YOU RECEIVED FROM YOUR SURGEON'S OFFICE!!!     Oral  Hygiene is also important to reduce your risk of infection.                                    Remember - BRUSH YOUR TEETH THE MORNING OF SURGERY WITH YOUR REGULAR TOOTHPASTE   Do NOT smoke after Midnight   Take these medicines the morning of surgery with A SIP OF WATER: Tylenol, Inhalers, Nexium, Zetia, Metoprolol, Flomax  DO NOT TAKE ANY ORAL DIABETIC MEDICATIONS  DAY OF YOUR SURGERY  How to Manage Your Diabetes Before and After Surgery  Why is it important to control my blood sugar before and after surgery? Improving blood sugar levels before and after surgery helps healing and can limit problems. A way of improving blood sugar control is eating a healthy diet by:  Eating less sugar and carbohydrates  Increasing activity/exercise  Talking with your doctor about reaching your blood sugar goals High blood sugars (greater than 180 mg/dL) can raise your risk of infections and slow your recovery, so you will need to focus on controlling your diabetes during the weeks before surgery. Make sure that the doctor who takes care of your diabetes knows about your planned surgery including the date and location.  How do I manage my blood sugar before surgery? Check your blood sugar at least 4 times a day, starting 2 days before surgery, to make sure that the level is not too high or low. Check your blood sugar the morning of your surgery when you wake up and every 2 hours until you get to the Short Stay unit. If your blood sugar is less than 70 mg/dL, you will need to treat for low blood sugar: Do not take insulin. Treat a low blood sugar (less than 70 mg/dL) with  cup of clear juice (cranberry or apple), 4 glucose tablets, OR glucose gel. Recheck blood sugar in 15 minutes after treatment (to make sure it is greater than 70 mg/dL). If your blood sugar is not greater than 70 mg/dL on recheck, call [phone] for further instructions. Report your blood sugar to the short stay nurse when you get to  Short Stay.  If you are admitted to the hospital after surgery: Your blood sugar will be checked by the staff and you will probably be given insulin after surgery (instead of oral diabetes medicines) to make sure you have good blood sugar levels. The goal for blood sugar control after surgery is 80-180 mg/dL.   WHAT DO I DO ABOUT MY DIABETES MEDICATION?  Do not take oral diabetes medicines (pills) the morning of surgery.  THE NIGHT BEFORE SURGERY, do not take Glipizide      THE MORNING OF SURGERY, do not take Glipizide   Reviewed and Endorsed by [facility] Patient Education Committee, August 2015   Bring CPAP mask and tubing day of surgery.                              You may not have any metal on your body including jewelry, and body piercing             Do not wear  lotions, powders ,cologne, or deodorant              Men may shave face and neck.   Do not bring valuables to the hospital. [place].   Contacts, dentures or bridgework may not be worn into surgery.   Bring small overnight bag day of surgery.   DO NOT [place]. PHARMACY WILL DISPENSE MEDICATIONS LISTED ON YOUR MEDICATION LIST TO YOU DURING YOUR ADMISSION [place]!   Special Instructions: Bring a copy of your healthcare power of attorney and living will documents the day of surgery if you haven't scanned them before.  Please read over the following fact sheets you were given: IF [place] [phone] Schneiders    If you received a COVID test during your pre-op visit  it is requested that you wear a mask when out in public, stay away from anyone that may not be feeling well and notify your surgeon if you develop symptoms. If you test positive for Covid or have been in contact with anyone that has tested positive in the last 10 days please notify you  surgeon.     [place] - Preparing for Surgery Before surgery, you can play an important role.  Because skin is not sterile, your skin needs to be as free of germs as possible.  You can reduce the number of germs on your skin by washing with CHG (chlorahexidine gluconate) soap before surgery.  CHG is an antiseptic cleaner which kills germs and bonds with the skin to continue killing germs even after washing. Please DO NOT use if you have an allergy to CHG or antibacterial soaps.  If your skin becomes reddened/irritated stop using the CHG and inform your nurse when you arrive at Short Stay. Do not shave (including legs and underarms) for at least 48 hours prior to the first CHG shower.  You may shave your face/neck.  Please follow these instructions carefully:  1.  Shower with CHG Soap the night before surgery and the  morning of surgery.  2.  If you choose to wash your hair, wash your hair first as usual with your normal  shampoo.  3.  After you shampoo, rinse your hair and body thoroughly to remove the shampoo.                             4.  Use CHG as you would any other liquid soap.  You can apply chg directly to the skin and wash.  Gently with a scrungie or clean washcloth.  5.  Apply the CHG Soap to your body ONLY FROM THE NECK DOWN.   Do   not use on face/ open                           Wound or open sores. Avoid contact with eyes, ears mouth and   genitals (private parts).                       Wash face,  Genitals (private parts) with your normal soap.             6.  Wash thoroughly, paying special attention to the area where your    surgery  will be performed.  7.  Thoroughly rinse your body with warm water from the neck down.  8.  DO NOT shower/wash with your normal soap after using and rinsing off the CHG Soap.                9.  Pat yourself dry with a clean towel.            10.  Wear clean pajamas.            11.  Place clean sheets on your bed the night of your first shower and  do not  sleep with pets. Day of Surgery : Do not apply any lotions/deodorants the morning of surgery.  Please wear clean clothes to  the hospital/surgery center.  FAILURE TO FOLLOW THESE INSTRUCTIONS MAY RESULT IN THE CANCELLATION OF YOUR SURGERY  PATIENT SIGNATURE_________________________________  NURSE SIGNATURE__________________________________  ________________________________________________________________________   Adam Phenix  An incentive spirometer is a tool that can help keep your lungs clear and active. This tool measures how well you are filling your lungs with each breath. Taking long deep breaths may help reverse or decrease the chance of developing breathing (pulmonary) problems (especially infection) following: A long period of time when you are unable to move or be active. BEFORE THE PROCEDURE  If the spirometer includes an indicator to show your best effort, your nurse or respiratory therapist will set it to a desired goal. If possible, sit up straight or lean slightly forward. Try not to slouch. Hold the incentive spirometer in an upright position. INSTRUCTIONS FOR USE  Sit on the edge of your bed if possible, or sit up as far as you can in bed or on a chair. Hold the incentive spirometer in an upright position. Breathe out normally. Place the mouthpiece in your mouth and seal your lips tightly around it. Breathe in slowly and as deeply as possible, raising the piston or the ball toward the top of the column. Hold your breath for 3-5 seconds or for as long as possible. Allow the piston or ball to fall to the bottom of the column. Remove the mouthpiece from your mouth and breathe out normally. Rest for a few seconds and repeat Steps 1 through 7 at least 10 times every 1-2 hours when you are awake. Take your time and take a few normal breaths between deep breaths. The spirometer may include an indicator to show your best effort. Use the indicator as a goal to  work toward during each repetition. After each set of 10 deep breaths, practice coughing to be sure your lungs are clear. If you have an incision (the cut made at the time of surgery), support your incision when coughing by placing a pillow or rolled up towels firmly against it. Once you are able to get out of bed, walk around indoors and cough well. You may stop using the incentive spirometer when instructed by your caregiver.  RISKS AND COMPLICATIONS Take your time so you do not get dizzy or light-headed. If you are in pain, you may need to take or ask for pain medication before doing incentive spirometry. It is harder to take a deep breath if you are having pain. AFTER USE Rest and breathe slowly and easily. It can be helpful to keep track of a log of your progress. Your caregiver can provide you with a simple table to help with this. If you are using the spirometer at home, follow these instructions: [place] IF:  You are having difficultly using the spirometer. You have trouble using the spirometer as often as instructed. Your pain medication is not giving enough relief while using the spirometer. You develop fever of 100.5 F (38.1 C) or higher. SEEK IMMEDIATE MEDICAL CARE IF:  You cough up bloody sputum that had not been present before. You develop fever of 102 F (38.9 C) or greater. You develop worsening pain at or near the incision site. MAKE SURE YOU:  Understand these instructions. Will watch your condition. Will get help right away if you are not doing well or get worse. Document Released: 05/06/2006 Document Revised: 03/18/2011 Document Reviewed: 07/07/2006 [facility] Patient Information 2014 [place], [place].   ________________________________________________________________________

## 2021-10-31 NOTE — Progress Notes (Signed)
Anesthesia Chart Review   Case: [id number] Date/Time: 11/09/21 1520   Procedure: SHOULDER ARTHROSCOPY WITH ROTATOR CUFF REPAIR BICEPS TENODESIS (Left) - 120   Anesthesia type: Choice   Pre-op diagnosis: Left shoulder rotator cuff tear, biceps tear   Location: [facility] ROOM 07 / [facility]   Surgeons: [name], MD       DISCUSSION:71 y.o. former smoker with h/o HTN, COPD, CAD (CABG 05/2020), s/p right CEA 06/2021, CKD Stage III, left shoulder rotator cuff tear, biceps tear scheduled for above procedure 11/09/2021 with Dr. [name].   Pt last seen by cardiology 10/03/2021. Per OV note, "tolerates great than 4 METs without symptoms - remains ok to proceed with surgery. "  Anticipate pt can proceed with planned procedure barring acute status change.   VS: BP 118/82   Pulse 60   Temp 36.5 C (Oral)   Resp 18   Ht '5\' 5"'$  (1.651 m)   Wt 76 kg   SpO2 99%   BMI 27.87 kg/m   PROVIDERS: Sherrilee Gilles, DO is PCP   Cardiologist - Carlyle Dolly, MD LABS: Labs reviewed: Acceptable for surgery. (all labs ordered are listed, but only abnormal results are displayed)  Labs Reviewed  HEMOGLOBIN A1C - Abnormal; Notable for the following components:      Result Value   Hgb A1c MFr Bld 6.0 (*)    All other components within normal limits  CBC - Abnormal; Notable for the following components:   RDW 17.1 (*)    All other components within normal limits  BASIC METABOLIC PANEL - Abnormal; Notable for the following components:   Glucose, Bld 135 (*)    All other components within normal limits  GLUCOSE, CAPILLARY - Abnormal; Notable for the following components:   Glucose-Capillary 142 (*)    All other components within normal limits     IMAGES:   EKG:   CV: Echo 10/16/2020 1. Left ventricular ejection fraction, by estimation, is 55 to 60%. The  left ventricle has normal function. The left ventricle has no regional  wall motion abnormalities. There is moderate asymmetric  left ventricular  hypertrophy of the basal-septal  segment.   2. Right ventricular systolic function is normal. The right ventricular  size is normal.   3. The inferior vena cava is dilated in size with >50% respiratory  variability, suggesting right atrial pressure of 8 mmHg.  Past Medical History:  Diagnosis Date   ADHD (attention deficit hyperactivity disorder)    Anemia    Aspiration pneumonia (HCC)    Asthma    exersise induced   CAD (coronary artery disease)    a. s/p CABG 05/2020.   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   LIMA to LAD, SVG to OM, SVG to RCA    Past Surgical History:  Procedure Laterality Date   COLONOSCOPY WITH ESOPHAGOGASTRODUODENOSCOPY (EGD)     CORONARY ARTERY BYPASS GRAFT N/A 06/02/2020   Procedure: CORONARY ARTERY BYPASS  GRAFTING (CABG) TIMES THREE USING RIGHT GREATER SAPHENOUS VEIN HARVESTED ENDOSCOPICALLY AND LEFT INTERNAL MAMMARY ARTERY.;  Surgeon: Rexene Alberts, MD;  Location: [place];  Service: Open Heart Surgery;  Laterality: N/A;   CORONARY ARTERY BYPASS GRAFT  06/02/2020   CYSTOSCOPY/RETROGRADE/URETEROSCOPY/STONE EXTRACTION WITH BASKET     ENDARTERECTOMY Right 06/12/2021   Procedure: RIGHT CAROTID ENDARTERECTOMY WITH PATCH ANGIOPLASTY;  Surgeon: Waynetta Sandy, MD;  Location: [place];  Service: Vascular;  Laterality: Right;   ESOPHAGECTOMY     Ivor-Lewis esophagectomy 1995 [facility]   IR THORACENTESIS ASP PLEURAL SPACE W/IMG GUIDE  06/06/2020   LEFT HEART CATH AND CORONARY ANGIOGRAPHY N/A 05/30/2020   Procedure: LEFT HEART CATH AND  CORONARY ANGIOGRAPHY;  Surgeon: [place], Peter M, MD;  Location: [place] CV LAB;  Service: Cardiovascular;  Laterality: N/A;   partial esophageal ejectory     SHOULDER OPEN ROTATOR CUFF REPAIR Left 10/26/2019   Procedure: ROTATOR CUFF REPAIR SHOULDER OPEN LEFT;  Surgeon: [name], MD;  Location[facility];  Service: Orthopedics;  Laterality: Left;    MEDICATIONS:  acetaminophen (TYLENOL) 500 MG tablet   albuterol (VENTOLIN HFA) 108 (90 Base) MCG/ACT inhaler   aspirin EC 81 MG EC tablet   B Complex Vitamins (VITAMIN B COMPLEX PO)   cholecalciferol (VITAMIN D3) 25 MCG (1000 UNIT) tablet   diclofenac sodium (VOLTAREN) 1 % GEL   esomeprazole (NEXIUM) 40 MG capsule   ezetimibe (ZETIA) 10 MG tablet   glipiZIDE (GLUCOTROL XL) 2.5 MG 24 hr tablet   hydrocortisone 2.5 % cream   ketoconazole (NIZORAL) 2 % cream   lisinopril (ZESTRIL) 2.5 MG tablet   metoprolol tartrate (LOPRESSOR) 100 MG tablet   rosuvastatin (CRESTOR) 40 MG tablet   tamsulosin (FLOMAX) 0.4 MG CAPS capsule   No current facility-administered medications for this encounter.    James Felix Ward, PA-C [facility] [phone]

## 2021-10-31 NOTE — Anesthesia Preprocedure Evaluation (Addendum)
Anesthesia Evaluation  Patient identified by MRN, date of birth, ID band Patient awake    Reviewed: Allergy & Precautions, H&P , NPO status , Patient's Chart, lab work & pertinent test results  History of Anesthesia Complications Negative for: history of anesthetic complications  Airway Mallampati: II  TM Distance: >3 FB Neck ROM: Full    Dental  (+) Partial Upper, Dental Advisory Given   Pulmonary asthma , COPD,  COPD inhaler, former smoker   breath sounds clear to auscultation       Cardiovascular hypertension, Pt. on medications and Pt. on home beta blockers (-) angina + CAD, + Past MI, + CABG (2022) and + Peripheral Vascular Disease   Rhythm:Regular Rate:Normal   '23 Carotid US - 40-59% left ICAS, 809-99% right ICAS (now s/p right CEA)  '22 ECHO: EF 55-60%. The LV has normal function, no regional wall motion abnormalities. There is moderate asymmetric left ventricular hypertrophy of the basal-septal segment. RVF is normal      Neuro/Psych negative neurological ROS  negative psych ROS   GI/Hepatic Neg liver ROS, hiatal hernia,GERD  Medicated and Poorly Controlled,,H/o esophageal cancer   Endo/Other  diabetes, Oral Hypoglycemic Agents   Pre-DM   Renal/GU Renal InsufficiencyRenal disease  negative genitourinary   Musculoskeletal   Abdominal   Peds  (+) ADHD Hematology  (+) Blood dyscrasia, anemia   Anesthesia Other Findings   Reproductive/Obstetrics negative OB ROS                             Anesthesia Physical Anesthesia Plan  ASA: 3  Anesthesia Plan: General   Post-op Pain Management: Tylenol PO (pre-op)* and Regional block*   Induction: Intravenous  PONV Risk Score and Plan: 3 and Ondansetron, Dexamethasone and Treatment may vary due to age or medical condition  Airway Management Planned: Oral ETT  Additional Equipment: None  Intra-op Plan:   Post-operative Plan:  Extubation in OR  Informed Consent: I have reviewed the patients History and Physical, chart, labs and discussed the procedure including the risks, benefits and alternatives for the proposed anesthesia with the patient or authorized representative who has indicated his/her understanding and acceptance.     Dental advisory given  Plan Discussed with: CRNA, Anesthesiologist and Surgeon  Anesthesia Plan Comments: (Plan routine monitors, GETA with interscalene block for post op analgesia)        Anesthesia Quick Evaluation

## 2021-11-09 ENCOUNTER — Ambulatory Visit (HOSPITAL_BASED_OUTPATIENT_CLINIC_OR_DEPARTMENT_OTHER): Payer: Medicare Other | Admitting: Anesthesiology

## 2021-11-09 ENCOUNTER — Ambulatory Visit (HOSPITAL_COMMUNITY): Payer: Medicare Other | Admitting: Physician Assistant

## 2021-11-09 ENCOUNTER — Other Ambulatory Visit: Payer: Self-pay

## 2021-11-09 ENCOUNTER — Observation Stay (HOSPITAL_COMMUNITY)
Admission: RE | Admit: 2021-11-09 | Discharge: 2021-11-10 | Disposition: A | Discharge status: Home / Self Care | Payer: Medicare Other | Source: Ambulatory Visit | Attending: Orthopedic Surgery | Admitting: Orthopedic Surgery

## 2021-11-09 ENCOUNTER — Encounter (HOSPITAL_COMMUNITY): Payer: Self-pay | Admitting: Orthopedic Surgery

## 2021-11-09 ENCOUNTER — Encounter (HOSPITAL_COMMUNITY)
Admission: RE | Disposition: A | Discharge status: Home / Self Care | Payer: Self-pay | Source: Ambulatory Visit | Attending: Orthopedic Surgery

## 2021-11-09 DIAGNOSIS — Z8501 Personal history of malignant neoplasm of esophagus: Secondary | ICD-10-CM | POA: Insufficient documentation

## 2021-11-09 DIAGNOSIS — N1831 Chronic kidney disease, stage 3a: Secondary | ICD-10-CM | POA: Insufficient documentation

## 2021-11-09 DIAGNOSIS — Z7982 Long term (current) use of aspirin: Secondary | ICD-10-CM | POA: Insufficient documentation

## 2021-11-09 DIAGNOSIS — M7522 Bicipital tendinitis, left shoulder: Secondary | ICD-10-CM | POA: Diagnosis not present

## 2021-11-09 DIAGNOSIS — Z951 Presence of aortocoronary bypass graft: Secondary | ICD-10-CM | POA: Diagnosis not present

## 2021-11-09 DIAGNOSIS — Z79899 Other long term (current) drug therapy: Secondary | ICD-10-CM | POA: Insufficient documentation

## 2021-11-09 DIAGNOSIS — X58XXXA Exposure to other specified factors, initial encounter: Secondary | ICD-10-CM | POA: Insufficient documentation

## 2021-11-09 DIAGNOSIS — R7303 Prediabetes: Secondary | ICD-10-CM

## 2021-11-09 DIAGNOSIS — Z7984 Long term (current) use of oral hypoglycemic drugs: Secondary | ICD-10-CM | POA: Insufficient documentation

## 2021-11-09 DIAGNOSIS — M75112 Incomplete rotator cuff tear or rupture of left shoulder, not specified as traumatic: Secondary | ICD-10-CM

## 2021-11-09 DIAGNOSIS — I129 Hypertensive chronic kidney disease with stage 1 through stage 4 chronic kidney disease, or unspecified chronic kidney disease: Secondary | ICD-10-CM | POA: Diagnosis not present

## 2021-11-09 DIAGNOSIS — Z87891 Personal history of nicotine dependence: Secondary | ICD-10-CM | POA: Insufficient documentation

## 2021-11-09 DIAGNOSIS — I2510 Atherosclerotic heart disease of native coronary artery without angina pectoris: Secondary | ICD-10-CM | POA: Insufficient documentation

## 2021-11-09 DIAGNOSIS — S43432A Superior glenoid labrum lesion of left shoulder, initial encounter: Secondary | ICD-10-CM | POA: Insufficient documentation

## 2021-11-09 DIAGNOSIS — J449 Chronic obstructive pulmonary disease, unspecified: Secondary | ICD-10-CM | POA: Insufficient documentation

## 2021-11-09 DIAGNOSIS — M75102 Unspecified rotator cuff tear or rupture of left shoulder, not specified as traumatic: Secondary | ICD-10-CM | POA: Diagnosis present

## 2021-11-09 DIAGNOSIS — E119 Type 2 diabetes mellitus without complications: Secondary | ICD-10-CM | POA: Diagnosis not present

## 2021-11-09 DIAGNOSIS — Z9889 Other specified postprocedural states: Secondary | ICD-10-CM

## 2021-11-09 DIAGNOSIS — J45909 Unspecified asthma, uncomplicated: Secondary | ICD-10-CM | POA: Diagnosis not present

## 2021-11-09 HISTORY — PX: SHOULDER ARTHROSCOPY WITH ROTATOR CUFF REPAIR: SHX5685

## 2021-11-09 LAB — GLUCOSE, CAPILLARY
Glucose-Capillary: 127 mg/dL — ABNORMAL HIGH (ref 70–99)
Glucose-Capillary: 100 mg/dL — ABNORMAL HIGH (ref 70–99)

## 2021-11-09 SURGERY — ARTHROSCOPY, SHOULDER, WITH ROTATOR CUFF REPAIR
Anesthesia: General | Site: Shoulder | Laterality: Left

## 2021-11-09 MED ORDER — METOCLOPRAMIDE HCL 5 MG/ML IJ SOLN: 5.0000 mg | Freq: Three times a day (TID) | INTRAMUSCULAR | Status: DC | PRN

## 2021-11-09 MED ORDER — CEFAZOLIN SODIUM-DEXTROSE 2-4 GM/100ML-% IV SOLN
2.0000 g | INTRAVENOUS | Status: AC
  Administered 2021-11-09: 2 g via INTRAVENOUS
  Filled 2021-11-09: qty 100

## 2021-11-09 MED ORDER — EZETIMIBE 10 MG PO TABS
10.0000 mg | ORAL_TABLET | Freq: Every day | ORAL | Status: DC
  Administered 2021-11-10: 10 mg via ORAL
  Filled 2021-11-09: qty 1

## 2021-11-09 MED ORDER — OXYCODONE HCL 5 MG/5ML PO SOLN: 5.0000 mg | Freq: Once | ORAL | Status: DC | PRN

## 2021-11-09 MED ORDER — LIDOCAINE 2% (20 MG/ML) 5 ML SYRINGE
INTRAMUSCULAR | Status: DC | PRN
  Administered 2021-11-09: 40 mg via INTRAVENOUS

## 2021-11-09 MED ORDER — OXYCODONE HCL 5 MG PO TABS: 5.0000 mg | ORAL_TABLET | Freq: Once | ORAL | Status: DC | PRN

## 2021-11-09 MED ORDER — LACTATED RINGERS IV SOLN: INTRAVENOUS | Status: DC

## 2021-11-09 MED ORDER — HYDROCODONE-ACETAMINOPHEN 7.5-325 MG PO TABS
1.0000 | ORAL_TABLET | ORAL | Status: DC | PRN
  Administered 2021-11-10: 1 via ORAL
  Filled 2021-11-09: qty 1

## 2021-11-09 MED ORDER — ORAL CARE MOUTH RINSE: 15.0000 mL | Freq: Once | OROMUCOSAL | Status: AC

## 2021-11-09 MED ORDER — LISINOPRIL 5 MG PO TABS
2.5000 mg | ORAL_TABLET | Freq: Every day | ORAL | Status: DC
  Administered 2021-11-10: 2.5 mg via ORAL
  Filled 2021-11-09: qty 1

## 2021-11-09 MED ORDER — SODIUM CHLORIDE 0.9 % IR SOLN
Status: DC | PRN
  Administered 2021-11-09: 6000 mL

## 2021-11-09 MED ORDER — CHLORHEXIDINE GLUCONATE 0.12 % MT SOLN
15.0000 mL | Freq: Once | OROMUCOSAL | Status: AC
  Administered 2021-11-09: 15 mL via OROMUCOSAL

## 2021-11-09 MED ORDER — EPHEDRINE SULFATE-NACL 50-0.9 MG/10ML-% IV SOSY
PREFILLED_SYRINGE | INTRAVENOUS | Status: DC | PRN
  Administered 2021-11-09: 10 mg via INTRAVENOUS
  Administered 2021-11-09: 5 mg via INTRAVENOUS
  Administered 2021-11-09: 10 mg via INTRAVENOUS

## 2021-11-09 MED ORDER — PHENOL 1.4 % MT LIQD: 1.0000 | OROMUCOSAL | Status: DC | PRN

## 2021-11-09 MED ORDER — HYDROCODONE-ACETAMINOPHEN 5-325 MG PO TABS: 1.0000 | ORAL_TABLET | ORAL | 0 refills | Status: DC | PRN

## 2021-11-09 MED ORDER — PROPOFOL 10 MG/ML IV BOLUS
INTRAVENOUS | Status: DC | PRN
  Administered 2021-11-09: 120 mg via INTRAVENOUS

## 2021-11-09 MED ORDER — TAMSULOSIN HCL 0.4 MG PO CAPS
0.4000 mg | ORAL_CAPSULE | ORAL | Status: DC
  Administered 2021-11-10: 0.4 mg via ORAL
  Filled 2021-11-09: qty 1

## 2021-11-09 MED ORDER — MORPHINE SULFATE (PF) 2 MG/ML IV SOLN: 0.5000 mg | INTRAVENOUS | Status: DC | PRN

## 2021-11-09 MED ORDER — ACETAMINOPHEN 500 MG PO TABS
1000.0000 mg | ORAL_TABLET | Freq: Once | ORAL | Status: AC
  Administered 2021-11-09: 1000 mg via ORAL

## 2021-11-09 MED ORDER — EPINEPHRINE PF 1 MG/ML IJ SOLN
INTRAMUSCULAR | Status: AC
  Filled 2021-11-09: qty 1

## 2021-11-09 MED ORDER — ROSUVASTATIN CALCIUM 20 MG PO TABS
40.0000 mg | ORAL_TABLET | Freq: Every day | ORAL | Status: DC
  Administered 2021-11-09 – 2021-11-10 (×2): 40 mg via ORAL
  Filled 2021-11-09 (×2): qty 2

## 2021-11-09 MED ORDER — HYDROCODONE-ACETAMINOPHEN 5-325 MG PO TABS: 1.0000 | ORAL_TABLET | ORAL | Status: DC | PRN

## 2021-11-09 MED ORDER — FENTANYL CITRATE PF 50 MCG/ML IJ SOSY: 25.0000 ug | PREFILLED_SYRINGE | INTRAMUSCULAR | Status: DC | PRN

## 2021-11-09 MED ORDER — PANTOPRAZOLE SODIUM 40 MG PO TBEC
40.0000 mg | DELAYED_RELEASE_TABLET | Freq: Every day | ORAL | Status: DC
  Administered 2021-11-10: 40 mg via ORAL
  Filled 2021-11-09: qty 1

## 2021-11-09 MED ORDER — PROPOFOL 10 MG/ML IV BOLUS
INTRAVENOUS | Status: AC
  Filled 2021-11-09: qty 20

## 2021-11-09 MED ORDER — METOCLOPRAMIDE HCL 5 MG PO TABS: 5.0000 mg | ORAL_TABLET | Freq: Three times a day (TID) | ORAL | Status: DC | PRN

## 2021-11-09 MED ORDER — GLIPIZIDE ER 2.5 MG PO TB24
2.5000 mg | ORAL_TABLET | Freq: Every day | ORAL | Status: DC
  Administered 2021-11-10: 2.5 mg via ORAL
  Filled 2021-11-09: qty 1

## 2021-11-09 MED ORDER — ROCURONIUM BROMIDE 10 MG/ML (PF) SYRINGE
PREFILLED_SYRINGE | INTRAVENOUS | Status: DC | PRN
  Administered 2021-11-09: 40 mg via INTRAVENOUS

## 2021-11-09 MED ORDER — HYDROCODONE-ACETAMINOPHEN 7.5-325 MG PO TABS
ORAL_TABLET | ORAL | Status: AC
  Administered 2021-11-09: 1 via ORAL
  Filled 2021-11-09: qty 1

## 2021-11-09 MED ORDER — EPINEPHRINE 1 MG/ML IJ SOLN
INTRAMUSCULAR | Status: DC | PRN
  Administered 2021-11-09: 1 mg

## 2021-11-09 MED ORDER — ROCURONIUM BROMIDE 10 MG/ML (PF) SYRINGE
PREFILLED_SYRINGE | INTRAVENOUS | Status: AC
  Filled 2021-11-09: qty 10

## 2021-11-09 MED ORDER — ACETAMINOPHEN 500 MG PO TABS
ORAL_TABLET | ORAL | Status: AC
  Filled 2021-11-09: qty 2

## 2021-11-09 MED ORDER — BUPIVACAINE-EPINEPHRINE (PF) 0.5% -1:200000 IJ SOLN
INTRAMUSCULAR | Status: DC | PRN
  Administered 2021-11-09: 10 mL via PERINEURAL

## 2021-11-09 MED ORDER — ASPIRIN 81 MG PO TBEC
81.0000 mg | DELAYED_RELEASE_TABLET | Freq: Every day | ORAL | Status: DC
  Administered 2021-11-09 – 2021-11-10 (×2): 81 mg via ORAL
  Filled 2021-11-09 (×2): qty 1

## 2021-11-09 MED ORDER — BUPIVACAINE LIPOSOME 1.3 % IJ SUSP
INTRAMUSCULAR | Status: DC | PRN
  Administered 2021-11-09: 10 mL via PERINEURAL

## 2021-11-09 MED ORDER — DEXAMETHASONE SODIUM PHOSPHATE 10 MG/ML IJ SOLN
INTRAMUSCULAR | Status: DC | PRN
  Administered 2021-11-09: 10 mg via INTRAVENOUS

## 2021-11-09 MED ORDER — MENTHOL 3 MG MT LOZG: 1.0000 | LOZENGE | OROMUCOSAL | Status: DC | PRN

## 2021-11-09 MED ORDER — ALBUTEROL SULFATE (2.5 MG/3ML) 0.083% IN NEBU: 2.5000 mg | INHALATION_SOLUTION | Freq: Four times a day (QID) | RESPIRATORY_TRACT | Status: DC | PRN

## 2021-11-09 MED ORDER — ONDANSETRON HCL 4 MG/2ML IJ SOLN: 4.0000 mg | Freq: Once | INTRAMUSCULAR | Status: DC | PRN

## 2021-11-09 MED ORDER — MIDAZOLAM HCL 2 MG/2ML IJ SOLN
1.0000 mg | INTRAMUSCULAR | Status: DC
  Administered 2021-11-09: 2 mg via INTRAVENOUS
  Filled 2021-11-09: qty 2

## 2021-11-09 MED ORDER — LIDOCAINE HCL (PF) 2 % IJ SOLN
INTRAMUSCULAR | Status: AC
  Filled 2021-11-09: qty 5

## 2021-11-09 MED ORDER — SUCCINYLCHOLINE CHLORIDE 200 MG/10ML IV SOSY
PREFILLED_SYRINGE | INTRAVENOUS | Status: DC | PRN
  Administered 2021-11-09: 120 mg via INTRAVENOUS

## 2021-11-09 MED ORDER — PHENYLEPHRINE 80 MCG/ML (10ML) SYRINGE FOR IV PUSH (FOR BLOOD PRESSURE SUPPORT)
PREFILLED_SYRINGE | INTRAVENOUS | Status: DC | PRN
  Administered 2021-11-09: 80 ug via INTRAVENOUS

## 2021-11-09 MED ORDER — DOCUSATE SODIUM 100 MG PO CAPS
100.0000 mg | ORAL_CAPSULE | Freq: Two times a day (BID) | ORAL | Status: DC
  Administered 2021-11-09 – 2021-11-10 (×2): 100 mg via ORAL
  Filled 2021-11-09 (×2): qty 1

## 2021-11-09 MED ORDER — FENTANYL CITRATE PF 50 MCG/ML IJ SOSY
50.0000 ug | PREFILLED_SYRINGE | INTRAMUSCULAR | Status: DC
  Administered 2021-11-09: 50 ug via INTRAVENOUS
  Filled 2021-11-09: qty 2

## 2021-11-09 MED ORDER — METOPROLOL TARTRATE 50 MG PO TABS
50.0000 mg | ORAL_TABLET | Freq: Two times a day (BID) | ORAL | Status: DC
  Administered 2021-11-10: 50 mg via ORAL
  Filled 2021-11-09 (×2): qty 1

## 2021-11-09 MED ORDER — ACETAMINOPHEN 325 MG PO TABS: 325.0000 mg | ORAL_TABLET | Freq: Four times a day (QID) | ORAL | Status: DC | PRN

## 2021-11-09 MED ORDER — CEFAZOLIN SODIUM-DEXTROSE 1-4 GM/50ML-% IV SOLN
1.0000 g | Freq: Four times a day (QID) | INTRAVENOUS | Status: AC
  Administered 2021-11-09 – 2021-11-10 (×3): 1 g via INTRAVENOUS
  Filled 2021-11-09 (×3): qty 50

## 2021-11-09 MED ORDER — SUGAMMADEX SODIUM 200 MG/2ML IV SOLN
INTRAVENOUS | Status: DC | PRN
  Administered 2021-11-09: 200 mg via INTRAVENOUS

## 2021-11-09 MED ORDER — ACETAMINOPHEN 500 MG PO TABS
500.0000 mg | ORAL_TABLET | Freq: Four times a day (QID) | ORAL | Status: DC
  Administered 2021-11-09 – 2021-11-10 (×2): 500 mg via ORAL
  Filled 2021-11-09 (×2): qty 1

## 2021-11-09 MED ORDER — ALBUTEROL SULFATE HFA 108 (90 BASE) MCG/ACT IN AERS: 1.0000 | INHALATION_SPRAY | Freq: Four times a day (QID) | RESPIRATORY_TRACT | Status: DC | PRN

## 2021-11-09 SURGICAL SUPPLY — 62 items
ANCHOR SUT BIO SW 4.75X19.1 (Anchor) IMPLANT
ANCHOR SWIVELOCK BIO 4.75X19.1 (Anchor) IMPLANT
BAG COUNTER SPONGE SURGICOUNT (BAG) ×1 IMPLANT
BURR OVAL 8 FLU 4.0X13 (MISCELLANEOUS) ×1 IMPLANT
CANNULA 5.75X7 CRYSTAL CLEAR (CANNULA) IMPLANT
CANNULA 5.75X71 LONG (CANNULA) IMPLANT
CANNULA TWIST IN 8.25X7CM (CANNULA) IMPLANT
DISSECTOR  3.8MM X 13CM (MISCELLANEOUS)
DISSECTOR 3.8MM X 13CM (MISCELLANEOUS) IMPLANT
DRAPE ORTHO SPLIT 77X108 STRL (DRAPES) ×2
DRAPE SHEET LG 3/4 BI-LAMINATE (DRAPES) ×1 IMPLANT
DRAPE STERI 35X30 U-POUCH (DRAPES) ×1 IMPLANT
DRAPE SURG 17X23 STRL (DRAPES) ×1 IMPLANT
DRAPE SURG ORHT 6 SPLT 77X108 (DRAPES) ×2 IMPLANT
DRAPE U-SHAPE 47X51 STRL (DRAPES) ×1 IMPLANT
DURAPREP 26ML APPLICATOR (WOUND CARE) ×1 IMPLANT
ELECT REM PT RETURN 15FT ADLT (MISCELLANEOUS) IMPLANT
FIBERSTICK 2 (SUTURE) IMPLANT
GAUZE PAD ABD 8X10 STRL (GAUZE/BANDAGES/DRESSINGS) ×3 IMPLANT
GAUZE SPONGE 4X4 12PLY STRL (GAUZE/BANDAGES/DRESSINGS) ×1 IMPLANT
GLOVE BIO SURGEON STRL SZ7.5 (GLOVE) ×2 IMPLANT
GLOVE BIOGEL PI IND STRL 8 (GLOVE) ×2 IMPLANT
GOWN STRL REUS W/ TWL XL LVL3 (GOWN DISPOSABLE) ×2 IMPLANT
GOWN STRL REUS W/TWL XL LVL3 (GOWN DISPOSABLE) ×2
IV NS IRRIG 3000ML ARTHROMATIC (IV SOLUTION) ×2 IMPLANT
KIT BASIN OR (CUSTOM PROCEDURE TRAY) ×1 IMPLANT
KIT TURNOVER KIT A (KITS) IMPLANT
MANIFOLD NEPTUNE II (INSTRUMENTS) ×1 IMPLANT
NDL 1/2 CIR CATGUT .05X1.09 (NEEDLE) IMPLANT
NDL HD SCORPION MEGA LOADER (NEEDLE) IMPLANT
NDL SAFETY ECLIP 18X1.5 (MISCELLANEOUS) IMPLANT
NDL SCORPION MULTI FIRE (NEEDLE) IMPLANT
NEEDLE 1/2 CIR CATGUT .05X1.09 (NEEDLE) IMPLANT
NEEDLE SCORPION MULTI FIRE (NEEDLE) IMPLANT
PACK ARTHROSCOPY WL (CUSTOM PROCEDURE TRAY) ×1 IMPLANT
PROBE APOLLO 90XL (SURGICAL WAND) ×1 IMPLANT
PROBE BIPOLAR ATHRO 135MM 90D (MISCELLANEOUS) IMPLANT
SLEEVE ARM SUSPENSION SYSTEM (MISCELLANEOUS) ×1 IMPLANT
SLING S3 LATERAL DISP (MISCELLANEOUS) ×1 IMPLANT
SLING ULTRA II L (ORTHOPEDIC SUPPLIES) IMPLANT
SPONGE T-LAP 4X18 ~~LOC~~+RFID (SPONGE) IMPLANT
STRIP CLOSURE SKIN 1/2X4 (GAUZE/BANDAGES/DRESSINGS) IMPLANT
SUT 2 FIBERLOOP 20 STRT BLUE (SUTURE)
SUT FIBERWIRE #2 38 T-5 BLUE (SUTURE)
SUT MNCRL AB 3-0 PS2 27 (SUTURE) ×1 IMPLANT
SUT PDS AB 0 CT1 36 (SUTURE) IMPLANT
SUT TIGER TAPE 7 IN WHITE (SUTURE) IMPLANT
SUT VIC AB 0 CT1 36 (SUTURE) IMPLANT
SUT VIC AB 2-0 CT1 27 (SUTURE)
SUT VIC AB 2-0 CT1 TAPERPNT 27 (SUTURE) IMPLANT
SUTURE 2 FIBERLOOP 20 STRT BLU (SUTURE) IMPLANT
SUTURE FIBERWR #2 38 T-5 BLUE (SUTURE) IMPLANT
SUTURE TAPE 1.3 40 TPR END (SUTURE) IMPLANT
SUTURETAPE 1.3 40 TPR END (SUTURE) ×2
SYR 27GX1/2 1ML LL SAFETY (SYRINGE) IMPLANT
SYSTEM TENODESIS BC LNT 3.9 (Orthopedic Implant) IMPLANT
TAPE CLOTH SURG 6X10 WHT LF (GAUZE/BANDAGES/DRESSINGS) ×1 IMPLANT
TOWEL OR 17X26 10 PK STRL BLUE (TOWEL DISPOSABLE) ×1 IMPLANT
TUBING ARTHROSCOPY IRRIG 16FT (MISCELLANEOUS) ×2 IMPLANT
TUBING CONNECTING 10 (TUBING) ×2 IMPLANT
WAND ABLATOR APOLLO I90 (BUR) IMPLANT
WATER STERILE IRR 500ML POUR (IV SOLUTION) ×1 IMPLANT

## 2021-11-09 NOTE — Brief Op Note (Signed)
11/09/2021  5:30 PM  PATIENT:  James Wolf  71 y.o. male  PRE-OPERATIVE DIAGNOSIS:  Left shoulder rotator cuff tear, biceps tear  POST-OPERATIVE DIAGNOSIS:  Left shoulder rotator cuff tear, biceps tear  PROCEDURE:  Procedure(s) with comments: SHOULDER ARTHROSCOPY WITH ROTATOR CUFF REPAIR BICEPS TENODESIS (Left) - 120  SURGEON:  Surgeon(s) and Role:    * Stann Mainland, Elly Modena, MD - Primary  PHYSICIAN ASSISTANT: Jonelle Sidle, PA-C  ANESTHESIA:   regional and general  EBL:  10 cc  BLOOD ADMINISTERED:none  DRAINS: none   LOCAL MEDICATIONS USED:  NONE  SPECIMEN:  No Specimen  DISPOSITION OF SPECIMEN:  N/A  COUNTS:  YES  TOURNIQUET:  * No tourniquets in log *  DICTATION: .Note written in EPIC  PLAN OF CARE: Discharge to home after PACU  PATIENT DISPOSITION:  PACU - hemodynamically stable.   Delay start of Pharmacological VTE agent (>24hrs) due to surgical blood loss or risk of bleeding: not applicable

## 2021-11-09 NOTE — Anesthesia Procedure Notes (Signed)
Anesthesia Regional Block: Interscalene brachial plexus block   Pre-Anesthetic Checklist: , timeout performed,  Correct Patient, Correct Site, Correct Laterality,  Correct Procedure, Correct Position, site marked,  Risks and benefits discussed,  Surgical consent,  Pre-op evaluation,  At surgeon's request and post-op pain management  Laterality: Left and Upper  Prep: chloraprep       Needles:  Injection technique: Single-shot  Needle Type: Echogenic Needle     Needle Length: 9cm  Needle Gauge: 21     Additional Needles:   Procedures:,,,, ultrasound used (permanent image in chart),,    Narrative:  Start time: 11/09/2021 2:56 PM End time: 11/09/2021 3:02 PM Injection made incrementally with aspirations every 5 mL.  Performed by: Personally  Anesthesiologist: Annye Asa, MD  Additional Notes: Pt identified in Holding room.  Monitors applied. Working IV access confirmed. Sterile prep L clavicle and neck.  #21ga ECHOgenic Arrow block needle to interscalene brachial plexus with US guidance.  10cc 0.5% Bupivacaine 1:200k epi, Exparel injected incrementally after negative test dose.  Patient asymptomatic, VSS, no heme aspirated, tolerated well.   Jenita Seashore, MD

## 2021-11-09 NOTE — Op Note (Signed)
Date of Surgery: 11/09/2021  INDICATIONS: James Wolf is a 71 y.o.-year-old male with a left high-grade partial articular superior rotator cuff tear as well as proximal biceps tendinitis and tearing.  He is here today for arthroscopic assisted repair.;  The patient did consent to the procedure after discussion of the risks and benefits.  PREOPERATIVE DIAGNOSIS:  1.  Left shoulder high-grade partial articular supraspinatus rotator cuff tearing 2.  Left shoulder long head of biceps tendinitis 3.  Left shoulder superior labral tearing anterior to posterior, type II  POSTOPERATIVE DIAGNOSIS: Same.  SURGEON: Geralynn Rile, M.D.  ASSIST: Jonelle Sidle, PA-C.  ANESTHESIA:  general, interscalene block  IV FLUIDS AND URINE: See anesthesia.  ESTIMATED BLOOD LOSS: 10 mL.  IMPLANTS:  3.9 mm swivel lock anchor x1 4.75 mm swivel lock anchor x1  DRAINS: None  COMPLICATIONS: None.  PROCEDURE: Arthroscopic extensive debridement - 17510 Anterior Labrum, Superior Labrum, Posterior Labrum, and rotator interval Arthroscopic rotator cuff repair - 25852 Arthroscopic biceps tenodesis - 77824   OPERATIVE FINDING: Exam under anesthesia: Normal Articular space: Normal Chondral surfaces:  Type II SLAP tear noted superior.  Degenerative type labral tearing anterior and posterior.  Inflammatory tissue also noted in the rotator interval. Biceps:  Tenosynovitis noted in the intra-articular portion as well as split tears along the intra-articular portion just distal to the biceps anchor to the superior labrum Subscapularis: Intact  Supraspinatus: Incomplete tear greater than 70% partial articular sided tearing with medialization of the rotator cable Infraspinatus: Intact  Teres minor: Intact   DESCRIPTION OF PROCEDURE: The patient was brought to the operating room and placed supine on the operating table.  The patient had been signed prior to the procedure and this was documented. The patient had the  anesthesia placed by the anesthesiologist.  A time-out was performed to confirm that this was the correct patient, site, side and location. The patient did receive antibiotics prior to the incision and was re-dosed during the procedure as needed at indicated intervals.  A tourniquet was not placed.  The patient had the operative extremity prepped and draped in the standard surgical fashion.      After obtaining informed consent the patient was brought to the operating table and underwent satisfactory anesthesia. An exam under anesthesia revealed full range of motion. He was placed in the right lateral decubitus position with an axillary roll and all bony prominences properly padded. A standard surgical timeout was performed. He was placed in 10 pounds of gentle in-line suspension.  Standard posterior and anterior superior portals were established. A diagnostic evaluation of the glenohumeral joint was performed. The biceps tendon was markedly synovitic and partially torn.  At this juncture went ahead with the extensive debridement of the joint.  While viewing posteriorly working anterior we excised the rotator interval with motorized shaver.  This removed all inflammatory tissue including the middle glenohumeral ligament.  We also debrided the anterior labrum as well as posterior labrum.  There was a type II SLAP tear noted superiorly that was addressed with the biceps tenodesis.  We next moved to the arthroscopic biceps tenodesis with the loop and tack method.  We started with a suture tape passed around the long head of the biceps.  We then locked this to be a luggage tag technique.  We then pierced the tendon distal to this luggage tag with a single strand of the suture tape.  This secured the tendon.  We then cut the tendon from the superior labrum.  We  then loaded this into a 3.9 mm swivel lock anchor that was placed just superior to the old upper border of subscapularis.  We did also noted in the  intra-articular scope there was the articular sided tear of the supraspinatus that was greater than 50%.  The cable was also medialized due to the elongation of the tissue.  The arthroscope was inserted in the subacromial space and an additional lateral portal was established.  There was evidence of previous subacromial decompression from prior surgery.  We then moved to remove the subdeltoid and subacromial bursa.  This was done via a lateral portal that was established via spinal needle localization.  We then moved ahead with our rotator cuff repair.  We used a motorized shaver to take down the residual bursal sided tissue at the greater tuberosity.  We then used a motorized shaver to prepare the greater tuberosity by gently decorticating the bone to receive the tendon.  We then utilized a speed fix technique and passed 2 suture tapes in a horizontal mattress fashion from anterior to posterior for a total of 4 passes into the tendon the rotator cable.  We then loaded all 4 these limbs into a single 4.75 mm swivel lock anchor.  This was then placed into the lateral aspect of the greater tuberosity had excellent fixation.  This completed the rotator cuff repair.  The arthroscope was then removed and portals closed with 4-0 Monocryl in standard fashion followed by a sterile occlusive dressing Polar Care ice sleeve and a slingshot sling. The patient was sent to recovery in stable condition and tolerated the procedure well  POSTOPERATIVE PLAN:  James Wolf will be nonweightbearing to the left upper extremity for approximately 6 weeks.  He will begin range of motion exercise in a passive fashion in 2 weeks.  He will be on the less than 3 cm repair protocol.  We will see him back in the office in 2 weeks.  However, will be admitted today due to his medical comorbidity profile.  Hopefully discharge home tomorrow morning.

## 2021-11-09 NOTE — Discharge Instructions (Signed)
Orthopedic surgery discharge instructions:  -Maintain postoperative bandages for 3 days.  You may remove these bandages on post op day 3 and begin showering at that time.  Please do not submerge underwater.  -Maintain your arm in sling at all times.  You should only remove for showering and getting dressed.  No lifting with the operative arm.  -For mild to moderate pain use Tylenol and Advil in alternating fashion around-the-clock.  For breakthrough pain use oxycodone as necessary.  -Please apply ice to the right shoulder for 20-30 minutes out of each hour that you are awake.  Do this around-the-clock for the first 3 days from surgery.  -Follow-up in 2 weeks for routine postoperative check.  

## 2021-11-09 NOTE — Anesthesia Postprocedure Evaluation (Signed)
Anesthesia Post Note  Patient: James Wolf  Procedure(s) Performed: SHOULDER ARTHROSCOPY WITH ROTATOR CUFF REPAIR BICEPS TENODESIS (Left: Shoulder)     Patient location during evaluation: PACU Anesthesia Type: General Level of consciousness: awake and alert, patient cooperative and oriented Pain management: pain level controlled Vital Signs Assessment: post-procedure vital signs reviewed and stable Respiratory status: spontaneous breathing, nonlabored ventilation and respiratory function stable Cardiovascular status: blood pressure returned to baseline and stable Postop Assessment: no apparent nausea or vomiting and adequate PO intake Anesthetic complications: no   No notable events documented.  Last Vitals:  Vitals:   11/09/21 1800 11/09/21 1815  BP: 120/69 126/73  Pulse: 64 72  Resp: 14 19  Temp: (!) 35.8 C (!) 35.6 C  SpO2: 100% 96%    Last Pain:  Vitals:   11/09/21 1815  PainSc: 2                  Cleopha Indelicato,E. Calem Cocozza

## 2021-11-09 NOTE — Transfer of Care (Signed)
Immediate Anesthesia Transfer of Care Note  Patient: James Wolf  Procedure(s) Performed: Procedure(s) with comments: SHOULDER ARTHROSCOPY WITH ROTATOR CUFF REPAIR BICEPS TENODESIS (Left) - 120  Patient Location: PACU  Anesthesia Type:General  Level of Consciousness: Alert, Awake, Oriented  Airway & Oxygen Therapy: Patient Spontanous Breathing  Post-op Assessment: Report given to RN  Post vital signs: Reviewed and stable  Last Vitals:  Vitals:   11/09/21 1535 11/09/21 1540  BP: 99/60 107/69  Pulse: (!) 49 (!) 53  Resp: 16 15  SpO2: 22% 58%    Complications: No apparent anesthesia complications

## 2021-11-09 NOTE — Plan of Care (Signed)
  Problem: Education: Goal: Knowledge of the prescribed therapeutic regimen will improve Outcome: Progressing   Problem: Pain Management: Goal: Pain level will decrease with appropriate interventions Outcome: Progressing   Problem: Safety: Goal: Ability to remain free from injury will improve Outcome: Progressing

## 2021-11-09 NOTE — H&P (Signed)
ORTHOPAEDIC H and P  REQUESTING PHYSICIAN: [name], MD  PCP:  Sherrilee Gilles, DO  Chief Complaint: Left shoulder rotator cuff tear  HPI: James Wolf is a 71 y.o. male who complains of left shoulder pain and weakness.  He has a history of rotator cuff repair a few years back.  He did fairly well following that.  However, has had recurrent pain and weakness of the shoulder.  Here today for revision arthroscopic rotator cuff repair with biceps tenodesis and subacromial decompression.  No new complaints at this time.  Left shoulder is at its baseline function from our previous visit in the office.  Past Medical History:  Diagnosis Date   ADHD (attention deficit hyperactivity disorder)    Anemia    Aspiration pneumonia (HCC)    Asthma    exersise induced   CAD (coronary artery disease)    a. s/p CABG 05/2020.   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   LIMA to LAD, SVG to OM, SVG to RCA   Past Surgical History:  Procedure Laterality Date   COLONOSCOPY WITH ESOPHAGOGASTRODUODENOSCOPY (EGD)     CORONARY ARTERY BYPASS GRAFT N/A 06/02/2020   Procedure: CORONARY ARTERY BYPASS GRAFTING (CABG) TIMES THREE USING RIGHT GREATER SAPHENOUS VEIN HARVESTED ENDOSCOPICALLY AND LEFT INTERNAL MAMMARY ARTERY.;  Surgeon: Rexene Alberts, MD;  Location: [place];  Service: Open Heart Surgery;  Laterality: N/A;   CORONARY ARTERY BYPASS GRAFT  06/02/2020   CYSTOSCOPY/RETROGRADE/URETEROSCOPY/STONE EXTRACTION WITH BASKET     ENDARTERECTOMY Right 06/12/2021   Procedure: RIGHT CAROTID  ENDARTERECTOMY WITH PATCH ANGIOPLASTY;  Surgeon: Waynetta Sandy, MD;  Location: [place];  Service: Vascular;  Laterality: Right;   ESOPHAGECTOMY     Ivor-Lewis esophagectomy 1995 [facility]   IR THORACENTESIS ASP PLEURAL SPACE W/IMG GUIDE  06/06/2020   LEFT HEART CATH AND CORONARY ANGIOGRAPHY N/A 05/30/2020   Procedure: LEFT HEART CATH AND CORONARY ANGIOGRAPHY;  Surgeon: Martinique, Peter M, MD;  Location: [place] [facility];  Service: Cardiovascular;  Laterality: N/A;   partial esophageal ejectory     SHOULDER OPEN ROTATOR CUFF REPAIR Left 10/26/2019   Procedure: ROTATOR CUFF REPAIR SHOULDER OPEN LEFT;  Surgeon: [name], MD;  Location: [facility];  Service: Orthopedics;  Laterality: Left;   Social History   Socioeconomic History   Marital status: Married    Spouse name: Not on file   Number of children: Not on file   Years of education: Not on file   Highest education level: Not on file  Occupational History   Not on file  Tobacco Use   Smoking status: Former    Packs/day: 1.00    Years: 25.00    Total pack years: 25.00    Types: Cigarettes    Quit date: 02/19/1993    Years since quitting: 28.7    Passive exposure: Never   Smokeless tobacco: Never  Vaping Use   Vaping Use: Never used  Substance and  Sexual Activity   Alcohol use: Yes    Comment: occ   Drug use: Never   Sexual activity: Yes  Other Topics Concern   Not on file  Social History Narrative   Not on file   Social Determinants of Health   Financial Resource Strain: Not on file  Food Insecurity: Not on file  Transportation Needs: Not on file  Physical Activity: Not on file  Stress: Not on file  Social Connections: Not on file   Family History  Problem Relation Age of Onset   Heart disease Mother    CAD Father    CVA Father    Allergies  Allergen Reactions   Zithromax [Azithromycin] Diarrhea    Causes pt's stomach to "tear up" does not want to take medication again.    Prior to Admission  medications   Medication Sig Start Date End Date Taking? Authorizing Provider  aspirin EC 81 MG EC tablet Take 1 tablet (81 mg total) by mouth daily. Swallow whole. 06/08/20  Yes Roddenberry, Myron G, PA-C  B Complex Vitamins (VITAMIN B COMPLEX PO) Take 1 tablet by mouth 2 (two) times daily. 03/10/07  Yes [provider]  cholecalciferol (VITAMIN D3) 25 MCG (1000 UNIT) tablet Take 1,000 Units by mouth 2 (two) times daily.   Yes [provider]  diclofenac sodium (VOLTAREN) 1 % GEL Apply 2 g topically 2 (two) times daily as needed (pain).  09/24/15  Yes [provider]  esomeprazole (NEXIUM) 40 MG capsule Take 40 mg by mouth 2 (two) times daily.  05/26/17  Yes [provider]  ezetimibe (ZETIA) 10 MG tablet TAKE 1 TABLET BY MOUTH EVERY DAY 05/24/21  Yes Branch, Alphonse Guild, MD  hydrocortisone 2.5 % cream Apply 1 application. topically 2 (two) times daily as needed for rash. 05/29/21  Yes [provider]  ketoconazole (NIZORAL) 2 % cream Apply 1 application. topically 2 (two) times daily as needed for irritation. 05/29/21  Yes [provider]  lisinopril (ZESTRIL) 2.5 MG tablet Take 2.5 mg by mouth daily. 03/15/21  Yes [provider]  metoprolol tartrate (LOPRESSOR) 100 MG tablet Take 1/2 (one-half) tablet by mouth twice daily 07/17/21  Yes Branch, Alphonse Guild, MD  rosuvastatin (CRESTOR) 40 MG tablet TAKE 1 TABLET BY MOUTH ONCE DAILY AT 6 PM 07/17/21  Yes Branch, Alphonse Guild, MD  tamsulosin (FLOMAX) 0.4 MG CAPS capsule Take 0.4 mg by mouth 4 (four) times a week. 03/22/20  Yes [provider]  acetaminophen (TYLENOL) 500 MG tablet Take 1,000 mg by mouth every 6 (six) hours as needed for moderate pain.    [provider]  albuterol (VENTOLIN HFA) 108 (90 Base) MCG/ACT inhaler Inhale 1-2 puffs into the lungs every 6 (six) hours as needed for wheezing or shortness of breath.    [provider]  glipiZIDE (GLUCOTROL XL) 2.5 MG 24 hr  tablet Take 2.5 mg by mouth daily with breakfast. 03/15/21   [provider]   No results found.  Positive ROS: All other systems have been reviewed and were otherwise negative with the exception of those mentioned in the HPI and as above.  Physical Exam: General: Alert, no acute distress Cardiovascular: No pedal edema Respiratory: No cyanosis, no use of accessory musculature GI: No organomegaly, abdomen is soft and non-tender Skin: No lesions in the area of chief complaint Neurologic: Sensation intact distally Psychiatric: Patient is competent for consent with normal mood and affect Lymphatic: No axillary or cervical lymphadenopathy  MUSCULOSKELETAL:  Left upper extremity is warm and well-perfused.  He has previous surgical open incision about the anterolateral acromion.  Otherwise neurovascular intact distal.  Assessment: 1.  Left shoulder nontraumatic rotator cuff tear 2.  Left shoulder proximal biceps tendinitis  Plan: Plan proceed today with arthroscopic diagnostic procedure as well as likely rotator cuff repair and biceps tenodesis.  We again discussed the risk and benefits of the procedure in detail.  We discussed the risk of bleeding, infection, damage to surrounding nerves and vessels, stiffness, failure repairs, persistent pain, need for revision surgery, as well as the risk of anesthesia.  Has provided informed consent.  We will plan to keep him overnight for observation given his medical comorbidities.  Discharge home tomorrow in the a.m.    [name], MD Cell [phone]    11/09/2021 3:29 PM

## 2021-11-09 NOTE — Anesthesia Procedure Notes (Signed)
Procedure Name: Intubation Date/Time: 11/09/2021 4:19 PM  Performed by: Gerald Leitz, CRNAPre-anesthesia Checklist: Patient identified, Patient being monitored, Timeout performed, Emergency Drugs available and Suction available Patient Re-evaluated:Patient Re-evaluated prior to induction Oxygen Delivery Method: Circle system utilized Preoxygenation: Pre-oxygenation with 100% oxygen Induction Type: IV induction, Rapid sequence and Cricoid Pressure applied Ventilation: Mask ventilation without difficulty Laryngoscope Size: Mac and 3 Grade View: Grade I Tube type: Oral Tube size: 7.5 mm Number of attempts: 1 Airway Equipment and Method: Stylet Placement Confirmation: ETT inserted through vocal cords under direct vision, positive ETCO2 and breath sounds checked- equal and bilateral Secured at: 23 cm Tube secured with: Tape Dental Injury: Teeth and Oropharynx as per pre-operative assessment

## 2021-11-10 DIAGNOSIS — M75102 Unspecified rotator cuff tear or rupture of left shoulder, not specified as traumatic: Secondary | ICD-10-CM | POA: Diagnosis not present

## 2021-11-10 NOTE — Discharge Summary (Signed)
In most cases prophylactic antibiotics for Dental procdeures after total joint surgery are not necessary.  Exceptions are as follows:  1. History of prior total joint infection  2. Severely immunocompromised (Organ Transplant, cancer chemotherapy, Rheumatoid biologic meds such as [place])  3. Poorly controlled diabetes (A1C &gt; 8.0, blood glucose over 200)  If you have one of these conditions, contact your surgeon for an antibiotic prescription, prior to your dental procedure. Orthopedic Discharge Summary        Physician Discharge Summary  Patient ID: James Wolf MRN: [id number] DOB/AGE: [birth year] 71 y.o.  Admit date: 11/09/2021 Discharge date: 11/10/2021   Procedures:  Procedure(s) (LRB): SHOULDER ARTHROSCOPY WITH ROTATOR CUFF REPAIR BICEPS TENODESIS (Left)  Attending Physician:  Dr. Stann Mainland  Admission Diagnoses:   left shoulder rotator cuff tear  Discharge Diagnoses:  left shoulder rotator cuff tear   Past Medical History:  Diagnosis Date   ADHD (attention deficit hyperactivity disorder)    Anemia    Aspiration pneumonia ([place])    Asthma    exersise induced   CAD (coronary artery disease)    a. s/p CABG 05/2020.   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 Patient tolerated the procedure well and brought to the recovery room in good condition and subsequently to the floor. Patient  had an uncomplicated hospital course and was stable for discharge.   Disposition: Discharge disposition: 01-Home or Self Care      with follow up in 2 weeks    Follow-up Information     [name], MD Follow up in 2 week(s).   Specialty: Orthopedic Surgery Contact information: [address] [place] [address] [phone]                 Dental Antibiotics:  In most cases prophylactic antibiotics for Dental procdeures after total joint surgery are not necessary.  Exceptions are as follows:  1. History of prior total joint infection  2. Severely immunocompromised (Organ Transplant, cancer chemotherapy, Rheumatoid biologic meds such as [place])  3. Poorly controlled diabetes (A1C &gt; 8.0, blood glucose over 200)  If you have one of these conditions, contact your surgeon for an antibiotic prescription, prior to your dental procedure.  Discharge Instructions     Call MD / Call 911   Complete by: As directed    If you experience chest pain or shortness of breath, CALL 911 and be transported to the hospital emergency room.  If you develope a fever above 101 F, pus (white drainage) or increased drainage or redness at the wound, or calf pain, call your surgeon's office.   Constipation Prevention   Complete by: As directed    Drink plenty of fluids.  Prune juice may be helpful.  You may  use a stool softener, such as Colace (over the counter) 100 mg twice a day.  Use MiraLax (over the counter) for constipation as needed.   Diet - low sodium heart healthy   Complete by: As directed    Increase activity slowly as tolerated   Complete by: As directed    Post-operative opioid taper instructions:   Complete by: As directed    POST-OPERATIVE OPIOID TAPER INSTRUCTIONS: It is important to wean off of your opioid medication as soon as possible. If you do not need pain medication after your surgery it is ok to stop day one. Opioids  include: Codeine, Hydrocodone(Norco, Vicodin), Oxycodone(Percocet, oxycontin) and hydromorphone amongst others.  Long term and even short term use of opiods can cause: Increased pain response Dependence Constipation Depression Respiratory depression And more.  Withdrawal symptoms can include Flu like symptoms Nausea, vomiting And more Techniques to manage these symptoms Hydrate well Eat regular healthy meals Stay active Use relaxation techniques(deep breathing, meditating, yoga) Do Not substitute Alcohol to help with tapering If you have been on opioids for less than two weeks and do not have pain than it is ok to stop all together.  Plan to wean off of opioids This plan should start within one week post op of your joint replacement. Maintain the same interval or time between taking each dose and first decrease the dose.  Cut the total daily intake of opioids by one tablet each day Next start to increase the time between doses. The last dose that should be eliminated is the evening dose.          Allergies as of 11/10/2021       Reactions   Zithromax [azithromycin] Diarrhea   Causes pt's stomach to "tear up" does not want to take medication again.         Medication List     TAKE these medications    acetaminophen 500 MG tablet Commonly known as: TYLENOL Take 1,000 mg by mouth every 6 (six) hours as needed for moderate pain.   albuterol 108 (90 Base) MCG/ACT inhaler Commonly known as: VENTOLIN HFA Inhale 1-2 puffs into the lungs every 6 (six) hours as needed for wheezing or shortness of breath.   aspirin EC 81 MG tablet Take 1 tablet (81 mg total) by mouth daily. Swallow whole.   cholecalciferol 25 MCG (1000 UNIT) tablet Commonly known as: VITAMIN D3 Take 1,000 Units by mouth 2 (two) times daily.   diclofenac sodium 1 % Gel Commonly known as: VOLTAREN Apply 2 g topically 2 (two) times daily as needed (pain).   esomeprazole 40 MG capsule Commonly known  as: NEXIUM Take 40 mg by mouth 2 (two) times daily.   ezetimibe 10 MG tablet Commonly known as: ZETIA TAKE 1 TABLET BY MOUTH EVERY DAY   glipiZIDE 2.5 MG 24 hr tablet Commonly known as: GLUCOTROL XL Take 2.5 mg by mouth daily with breakfast.   HYDROcodone-acetaminophen 5-325 MG tablet Commonly known as: NORCO/VICODIN Take 1 tablet by mouth every 4 (four) hours as needed for moderate pain.   hydrocortisone 2.5 % cream Apply 1 application. topically 2 (two) times daily as needed for rash.   ketoconazole 2 % cream Commonly known as: NIZORAL Apply 1 application. topically 2 (two) times daily as needed for irritation.   lisinopril 2.5 MG tablet Commonly known as: ZESTRIL Take 2.5 mg by mouth daily.   metoprolol tartrate 100 MG tablet Commonly known as: LOPRESSOR Take 1/2 (one-half) tablet by mouth twice daily  rosuvastatin 40 MG tablet Commonly known as: CRESTOR TAKE 1 TABLET BY MOUTH ONCE DAILY AT 6 PM   tamsulosin 0.4 MG Caps capsule Commonly known as: FLOMAX Take 0.4 mg by mouth 4 (four) times a week.   VITAMIN B COMPLEX PO Take 1 tablet by mouth 2 (two) times daily.          Signed: Ventura Bruns 11/10/2021, 8:12 AM  [facility] is now [facility] [address] [place], [place], [place] [address] Phone: [place]

## 2021-11-10 NOTE — Progress Notes (Signed)
   Subjective: 1 Day Post-Op Procedure(s) (LRB): SHOULDER ARTHROSCOPY WITH ROTATOR CUFF REPAIR BICEPS TENODESIS (Left)  Pt doing well this morning Block still working thus no pain Denies any new symptoms/issues overnight Ready for d/c Patient reports pain as mild.  Objective:   VITALS:   Vitals:   11/10/21 0140 11/10/21 0551  BP: 120/73 100/76  Pulse: 93 100  Resp: 16 16  Temp: 97.6 F (36.4 C) 98.3 F (36.8 C)  SpO2: 96% 96%    Left shoulder portal sites healing Dressing changed Nv intact distally Sling in appropriate position  LABS No results for input(s): "HGB", "HCT", "WBC", "PLT" in the last 72 hours.  No results for input(s): "NA", "K", "BUN", "CREATININE", "GLUCOSE" in the last 72 hours.   Assessment/Plan: 1 Day Post-Op Procedure(s) (LRB): SHOULDER ARTHROSCOPY WITH ROTATOR CUFF REPAIR BICEPS TENODESIS (Left) D/c home today F/u in 2 weeks in the office Therapy starts next week     Merla Riches PA-C, [place] [facility] now [employer] [facility] [address], [place], [place], [place] [address] Phone: [phone] [email or web address] Facebook  [employer]

## 2021-11-10 NOTE — TOC CM/SW Note (Signed)
  Transition of Care [facility]) Screening Note   Patient Details  Name: James Wolf Date of Birth: 07-Oct-1950   Transition of Care [facility]) CM/SW Contact:    Ross Ludwig, LCSW Phone Number: 11/10/2021, 11:12 AM    Transition of Care Department [facility]) has reviewed patient and no TOC needs have been identified at this time. We will continue to monitor patient advancement through interdisciplinary progression rounds. If new patient transition needs arise, please place a TOC consult.

## 2021-11-12 ENCOUNTER — Encounter (HOSPITAL_COMMUNITY): Payer: Self-pay | Admitting: Orthopedic Surgery

## 2021-11-16 ENCOUNTER — Other Ambulatory Visit: Payer: Self-pay | Admitting: Cardiology

## 2021-11-17 ENCOUNTER — Other Ambulatory Visit: Payer: Self-pay | Admitting: Cardiology

## 2022-01-17 ENCOUNTER — Inpatient Hospital Stay (HOSPITAL_COMMUNITY)
Admission: EM | Admit: 2022-01-17 | Discharge: 2022-01-19 | DRG: 178 | Disposition: A | Discharge status: Home / Self Care | Payer: Medicare Other | Attending: Family Medicine | Admitting: Family Medicine

## 2022-01-17 ENCOUNTER — Encounter (HOSPITAL_COMMUNITY): Payer: Self-pay | Admitting: *Deleted

## 2022-01-17 ENCOUNTER — Emergency Department (HOSPITAL_COMMUNITY): Payer: Medicare Other

## 2022-01-17 ENCOUNTER — Other Ambulatory Visit: Payer: Self-pay

## 2022-01-17 DIAGNOSIS — E1165 Type 2 diabetes mellitus with hyperglycemia: Secondary | ICD-10-CM | POA: Diagnosis present

## 2022-01-17 DIAGNOSIS — Z823 Family history of stroke: Secondary | ICD-10-CM

## 2022-01-17 DIAGNOSIS — Z87442 Personal history of urinary calculi: Secondary | ICD-10-CM

## 2022-01-17 DIAGNOSIS — J690 Pneumonitis due to inhalation of food and vomit: Secondary | ICD-10-CM | POA: Diagnosis not present

## 2022-01-17 DIAGNOSIS — Z7982 Long term (current) use of aspirin: Secondary | ICD-10-CM

## 2022-01-17 DIAGNOSIS — F909 Attention-deficit hyperactivity disorder, unspecified type: Secondary | ICD-10-CM | POA: Diagnosis present

## 2022-01-17 DIAGNOSIS — E782 Mixed hyperlipidemia: Secondary | ICD-10-CM | POA: Diagnosis present

## 2022-01-17 DIAGNOSIS — E872 Acidosis, unspecified: Secondary | ICD-10-CM | POA: Diagnosis present

## 2022-01-17 DIAGNOSIS — R6510 Systemic inflammatory response syndrome (SIRS) of non-infectious origin without acute organ dysfunction: Secondary | ICD-10-CM | POA: Insufficient documentation

## 2022-01-17 DIAGNOSIS — I252 Old myocardial infarction: Secondary | ICD-10-CM

## 2022-01-17 DIAGNOSIS — I10 Essential (primary) hypertension: Secondary | ICD-10-CM | POA: Diagnosis present

## 2022-01-17 DIAGNOSIS — Z951 Presence of aortocoronary bypass graft: Secondary | ICD-10-CM

## 2022-01-17 DIAGNOSIS — J4489 Other specified chronic obstructive pulmonary disease: Secondary | ICD-10-CM | POA: Diagnosis present

## 2022-01-17 DIAGNOSIS — J449 Chronic obstructive pulmonary disease, unspecified: Secondary | ICD-10-CM | POA: Diagnosis present

## 2022-01-17 DIAGNOSIS — E1122 Type 2 diabetes mellitus with diabetic chronic kidney disease: Secondary | ICD-10-CM | POA: Diagnosis present

## 2022-01-17 DIAGNOSIS — Z79899 Other long term (current) drug therapy: Secondary | ICD-10-CM

## 2022-01-17 DIAGNOSIS — Z87891 Personal history of nicotine dependence: Secondary | ICD-10-CM

## 2022-01-17 DIAGNOSIS — I129 Hypertensive chronic kidney disease with stage 1 through stage 4 chronic kidney disease, or unspecified chronic kidney disease: Secondary | ICD-10-CM | POA: Diagnosis present

## 2022-01-17 DIAGNOSIS — Z8501 Personal history of malignant neoplasm of esophagus: Secondary | ICD-10-CM

## 2022-01-17 DIAGNOSIS — R739 Hyperglycemia, unspecified: Secondary | ICD-10-CM | POA: Insufficient documentation

## 2022-01-17 DIAGNOSIS — I2510 Atherosclerotic heart disease of native coronary artery without angina pectoris: Secondary | ICD-10-CM | POA: Diagnosis present

## 2022-01-17 DIAGNOSIS — K219 Gastro-esophageal reflux disease without esophagitis: Secondary | ICD-10-CM | POA: Diagnosis present

## 2022-01-17 DIAGNOSIS — Z7984 Long term (current) use of oral hypoglycemic drugs: Secondary | ICD-10-CM

## 2022-01-17 DIAGNOSIS — Z91199 Patient's noncompliance with other medical treatment and regimen due to unspecified reason: Secondary | ICD-10-CM

## 2022-01-17 DIAGNOSIS — J9601 Acute respiratory failure with hypoxia: Secondary | ICD-10-CM

## 2022-01-17 DIAGNOSIS — Z8249 Family history of ischemic heart disease and other diseases of the circulatory system: Secondary | ICD-10-CM

## 2022-01-17 DIAGNOSIS — Z1152 Encounter for screening for COVID-19: Secondary | ICD-10-CM

## 2022-01-17 DIAGNOSIS — Z888 Allergy status to other drugs, medicaments and biological substances status: Secondary | ICD-10-CM

## 2022-01-17 DIAGNOSIS — M810 Age-related osteoporosis without current pathological fracture: Secondary | ICD-10-CM | POA: Diagnosis present

## 2022-01-17 DIAGNOSIS — N1831 Chronic kidney disease, stage 3a: Secondary | ICD-10-CM | POA: Diagnosis present

## 2022-01-17 DIAGNOSIS — R7303 Prediabetes: Secondary | ICD-10-CM | POA: Insufficient documentation

## 2022-01-17 DIAGNOSIS — R000 Tachycardia, unspecified: Secondary | ICD-10-CM | POA: Diagnosis present

## 2022-01-17 LAB — CBC WITH DIFFERENTIAL/PLATELET
Abs Immature Granulocytes: 0.06 10*3/uL (ref 0.00–0.07)
Basophils Absolute: 0 10*3/uL (ref 0.0–0.1)
Basophils Relative: 0 %
Eosinophils Absolute: 0.4 10*3/uL (ref 0.0–0.5)
Eosinophils Relative: 2 %
HCT: 42.3 % (ref 39.0–52.0)
Hemoglobin: 13.9 g/dL (ref 13.0–17.0)
Immature Granulocytes: 0 %
Lymphocytes Relative: 4 %
Lymphs Abs: 0.6 10*3/uL — ABNORMAL LOW (ref 0.7–4.0)
MCH: 30.8 pg (ref 26.0–34.0)
MCHC: 32.9 g/dL (ref 30.0–36.0)
MCV: 93.6 fL (ref 80.0–100.0)
Monocytes Absolute: 1.1 10*3/uL — ABNORMAL HIGH (ref 0.1–1.0)
Monocytes Relative: 6 %
Neutro Abs: 15.2 10*3/uL — ABNORMAL HIGH (ref 1.7–7.7)
Neutrophils Relative %: 88 %
Platelets: 161 10*3/uL (ref 150–400)
RBC: 4.52 MIL/uL (ref 4.22–5.81)
RDW: 14.2 % (ref 11.5–15.5)
WBC: 17.3 10*3/uL — ABNORMAL HIGH (ref 4.0–10.5)
nRBC: 0 % (ref 0.0–0.2)

## 2022-01-17 LAB — URINALYSIS, ROUTINE W REFLEX MICROSCOPIC
Bacteria, UA: NONE SEEN
Bilirubin Urine: NEGATIVE
Glucose, UA: NEGATIVE mg/dL
Hgb urine dipstick: NEGATIVE
Ketones, ur: 20 mg/dL — AB
Nitrite: NEGATIVE
Protein, ur: 30 mg/dL — AB
Specific Gravity, Urine: 1.026 (ref 1.005–1.030)
pH: 5 (ref 5.0–8.0)

## 2022-01-17 LAB — COMPREHENSIVE METABOLIC PANEL
ALT: 56 U/L — ABNORMAL HIGH (ref 0–44)
AST: 46 U/L — ABNORMAL HIGH (ref 15–41)
Albumin: 4 g/dL (ref 3.5–5.0)
Alkaline Phosphatase: 40 U/L (ref 38–126)
Anion gap: 10 (ref 5–15)
BUN: 17 mg/dL (ref 8–23)
CO2: 26 mmol/L (ref 22–32)
Calcium: 9.1 mg/dL (ref 8.9–10.3)
Chloride: 103 mmol/L (ref 98–111)
Creatinine, Ser: 1.19 mg/dL (ref 0.61–1.24)
GFR, Estimated: >60 mL/min (ref >60)
Glucose, Bld: 166 mg/dL — ABNORMAL HIGH (ref 70–99)
Potassium: 3.6 mmol/L (ref 3.5–5.1)
Sodium: 139 mmol/L (ref 135–145)
Total Bilirubin: 0.6 mg/dL (ref 0.3–1.2)
Total Protein: 7.7 g/dL (ref 6.5–8.1)

## 2022-01-17 LAB — LACTIC ACID, PLASMA
Lactic Acid, Venous: 2 mmol/L (ref 0.5–1.9)
Lactic Acid, Venous: 4.4 mmol/L (ref 0.5–1.9)

## 2022-01-17 LAB — APTT: aPTT: 24 seconds (ref 24–36)

## 2022-01-17 LAB — PROTIME-INR
INR: 1 (ref 0.8–1.2)
Prothrombin Time: 12.9 seconds (ref 11.4–15.2)

## 2022-01-17 LAB — RESP PANEL BY RT-PCR (RSV, FLU A&B, COVID)  RVPGX2
Influenza A by PCR: NEGATIVE
Influenza B by PCR: NEGATIVE
Resp Syncytial Virus by PCR: NEGATIVE
SARS Coronavirus 2 by RT PCR: NEGATIVE

## 2022-01-17 MED ORDER — ALBUTEROL (5 MG/ML) CONTINUOUS INHALATION SOLN
10.0000 mg | INHALATION_SOLUTION | RESPIRATORY_TRACT | Status: AC
  Administered 2022-01-17: 10 mg via RESPIRATORY_TRACT

## 2022-01-17 MED ORDER — PIPERACILLIN-TAZOBACTAM 3.375 G IVPB 30 MIN
3.3750 g | Freq: Once | INTRAVENOUS | Status: AC
  Administered 2022-01-17: 3.375 g via INTRAVENOUS
  Filled 2022-01-17: qty 50

## 2022-01-17 MED ORDER — PANTOPRAZOLE SODIUM 40 MG IV SOLR
40.0000 mg | Freq: Two times a day (BID) | INTRAVENOUS | Status: DC
  Administered 2022-01-17 – 2022-01-18 (×3): 40 mg via INTRAVENOUS
  Filled 2022-01-17 (×4): qty 10

## 2022-01-17 MED ORDER — LACTATED RINGERS IV SOLN: INTRAVENOUS | Status: DC

## 2022-01-17 NOTE — ED Triage Notes (Signed)
Pt states aspirated due to acid reflux, pt concerned for PNA.  Denies any pain. Mild SOB-pt with hx COPD. Generalized weakness.  Denies any fevers at home

## 2022-01-17 NOTE — ED Provider Notes (Signed)
[facility] EMERGENCY DEPARTMENT Provider Note   CSN: [id number] Arrival date & time: 01/17/22  1727     History  Chief Complaint  Patient presents with   Aspiration    James Wolf is a 72 y.o. male.  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  It has been gradually worsening over the day and he presents tachycardic and hypoxic.  He denies nausea vomiting or diarrhea  Home Medications Prior to Admission medications   Medication Sig Start Date End Date Taking? Authorizing Provider  acetaminophen (TYLENOL) 500 MG tablet Take 1,000 mg by mouth every 6 (six) hours as needed for moderate pain.    [provider]  albuterol (VENTOLIN HFA) 108 (90 Base) MCG/ACT inhaler Inhale 1-2 puffs into the lungs every 6 (six) hours as needed for wheezing or shortness of breath.    [provider]  aspirin EC 81 MG EC tablet Take 1 tablet (81 mg total) by mouth daily. Swallow whole. 06/08/20   Antony Odea, PA-C  B Complex Vitamins (VITAMIN B COMPLEX PO) Take 1 tablet by mouth 2 (two) times daily. 03/10/07   [provider]  cholecalciferol (VITAMIN D3) 25 MCG (1000 UNIT) tablet Take 1,000 Units by mouth 2 (two) times daily.    [provider]  diclofenac sodium (VOLTAREN) 1 % GEL Apply 2 g topically 2 (two) times daily as needed (pain).  09/24/15   [provider]  esomeprazole (NEXIUM) 40 MG capsule Take 40 mg by mouth 2 (two) times daily.  05/26/17   [provider]  ezetimibe (ZETIA) 10 MG tablet TAKE 1 TABLET BY MOUTH EVERY DAY 11/16/21   Arnoldo Lenis, MD  glipiZIDE (GLUCOTROL XL) 2.5 MG  24 hr tablet Take 2.5 mg by mouth daily with breakfast. 03/15/21   [provider]  HYDROcodone-acetaminophen (NORCO/VICODIN) 5-325 MG tablet Take 1 tablet by mouth every 4 (four) hours as needed for moderate pain. 11/09/21 11/09/22  [name], MD  hydrocortisone 2.5 % cream Apply 1 application. topically 2 (two) times daily as needed for rash. 05/29/21   [provider]  ketoconazole (NIZORAL) 2 % cream Apply 1 application. topically 2 (two) times daily as needed for irritation. 05/29/21   [provider]  lisinopril (ZESTRIL) 2.5 MG tablet Take 2.5 mg by mouth daily. 03/15/21   [provider]  metoprolol tartrate (LOPRESSOR) 100 MG tablet Take 1/2 (one-half) tablet by mouth twice daily 07/17/21   Arnoldo Lenis, MD  rosuvastatin (CRESTOR) 40 MG tablet TAKE 1 TABLET BY MOUTH ONCE DAILY AT  6  PM 11/19/21   Arnoldo Lenis, MD  tamsulosin (FLOMAX) 0.4 MG CAPS capsule Take 0.4 mg by mouth 4 (four) times a week. 03/22/20   [provider]      Allergies    Zithromax [azithromycin]    Review of Systems   Review of Systems  All other systems reviewed and are negative.   Physical Exam Updated Vital Signs BP 106/79   Pulse (!) 129   Temp 98.4 F (36.9 C) (Oral)   Resp 14   Ht 1.651 m ('5\' 5"'$ )  Wt 74.8 kg   SpO2 91%   BMI 27.46 kg/m  Physical Exam Vitals and nursing note reviewed.  Constitutional:      General: He is in acute distress.     Appearance: He is well-developed.  HENT:     Head: Normocephalic and atraumatic.     Mouth/Throat:     Pharynx: No oropharyngeal exudate.  Eyes:     General: No scleral icterus.       Right eye: No discharge.        Left eye: No discharge.     Conjunctiva/sclera: Conjunctivae normal.     Pupils: Pupils are equal, round, and reactive to light.  Neck:     Thyroid: No thyromegaly.     Vascular: No JVD.  Cardiovascular:     Rate and Rhythm: Regular rhythm. Tachycardia present.     Heart  sounds: Normal heart sounds. No murmur heard.    No friction rub. No gallop.  Pulmonary:     Effort: Respiratory distress present.     Breath sounds: Wheezing and rales present.     Comments: This patient speaks in just shortened sentences, rales at the bases bilaterally, some expiratory wheezing is present, he is tachypneic with mild increased work of breathing but no accessory muscle use Abdominal:     General: Bowel sounds are normal. There is no distension.     Palpations: Abdomen is soft. There is no mass.     Tenderness: There is no abdominal tenderness.  Musculoskeletal:        General: No tenderness. Normal range of motion.     Cervical back: Normal range of motion and neck supple.     Right lower leg: No edema.     Left lower leg: No edema.  Lymphadenopathy:     Cervical: No cervical adenopathy.  Skin:    General: Skin is warm and dry.     Findings: No erythema or rash.  Neurological:     Mental Status: He is alert.     Coordination: Coordination normal.  Psychiatric:        Behavior: Behavior normal.     ED Results / Procedures / Treatments   Labs (all labs ordered are listed, but only abnormal results are displayed) Labs Reviewed  RESP PANEL BY RT-PCR (RSV, FLU A&B, COVID)  [id number]  CULTURE, BLOOD (ROUTINE X 2)  CULTURE, BLOOD (ROUTINE X 2)  URINE CULTURE  LACTIC ACID, PLASMA  LACTIC ACID, PLASMA  COMPREHENSIVE METABOLIC PANEL  CBC WITH DIFFERENTIAL/PLATELET  PROTIME-INR  APTT  URINALYSIS, ROUTINE W REFLEX MICROSCOPIC    EKG None  Radiology No results found.  Procedures .Critical Care  Performed by: Noemi Chapel, MD Authorized by: Noemi Chapel, MD   Critical care provider statement:    Critical care time (minutes):  45   Critical care time was exclusive of:  Separately billable procedures and treating other patients   Critical care was necessary to treat or prevent imminent or life-threatening deterioration of the following conditions:   Respiratory failure   Critical care was time spent personally by me on the following activities:  Development of treatment plan with patient or surrogate, discussions with consultants, evaluation of patient's response to treatment, examination of patient, obtaining history from patient or surrogate, review of old charts, re-evaluation of patient's condition, pulse oximetry, ordering and review of radiographic studies, ordering and review of laboratory studies and ordering and performing treatments and interventions   Care discussed with: admitting provider   Comments:  Medications Ordered in ED Medications  lactated ringers infusion (has no administration in time range)  piperacillin-tazobactam (ZOSYN) IVPB 3.375 g (has no administration in time range)  albuterol (PROVENTIL,VENTOLIN) solution continuous neb (has no administration in time range)    ED Course/ Medical Decision Making/ A&P                           Medical Decision Making Amount and/or Complexity of Data Reviewed Labs: ordered. Radiology: ordered. ECG/medicine tests: ordered.  Risk Prescription drug management. Decision regarding hospitalization.   This patient presents to the ED for concern of possible aspiration pneumonia, unfortunately he is tachycardic and hypoxic which makes him criteria for possible sepsis, whether this is related to sepsis or some other cause such as just an aspiration pneumonitis is unclear, this involves an extensive number of treatment options, and is a complaint that carries with it a high risk of complications and morbidity.  The differential diagnosis includes pneumonitis, pneumonia, aspiration, pneumothorax, less likely to be pulmonary embolism as he was completely asymptomatic prior to this event   Co morbidities that complicate the patient evaluation  History of esophagectomy   Additional history obtained:  Additional history obtained from electronic medical  record External records from outside source obtained and reviewed including the patient has history of diabetes, history of coronary disease, history of syncope, had been admitted to the hospital due to carotid artery stenosis and endarterectomy in June 2023   Lab Tests:  I Ordered, and personally interpreted labs.  The pertinent results include: Leukocytosis of 7 10,000, lactic acid of 2.0, metabolic panel overall unremarkable   Imaging Studies ordered:  I ordered imaging studies including portable chest x-ray I independently visualized and interpreted imaging which showed atelectasis but no obvious acute infiltrates I agree with the radiologist interpretation   Cardiac Monitoring: / EKG:  The patient was maintained on a cardiac monitor.  I personally viewed and interpreted the cardiac monitored which showed an underlying rhythm of: Sinus tachycardia   Consultations Obtained:  I requested consultation with the hospitalist Dr. Josephine Cables,  and discussed lab and imaging findings as well as pertinent plan - they recommend: Admission to the hospital, the patient will need this for hypoxic respiratory failure   Problem List / ED Course / Critical interventions / Medication management  What appears to be an aspiration pneumonitis, the patient is hypoxic, tachycardic, leukocytosis and has been given antibiotics.  He has no signs of hypotension and his lactic acid was less than 4 I ordered medication including Zosyn, albuterol for aspiration pneumonia and respiratory failure Reevaluation of the patient after these medicines showed that the patient persistent tachypnea and tachycardia I have reviewed the patients home medicines and have made adjustments as needed   Social Determinants of Health:  Esophagectomy for cancer   Test / Admission - Considered:  Will be admitted to higher level of care, critical care performed         Final Clinical Impression(s) / ED Diagnoses Final  diagnoses:  Aspiration pneumonia due to regurgitated food, unspecified laterality, unspecified part of lung ([place])  Acute respiratory failure with hypoxia ([place])     Noemi Chapel, MD 01/17/22 2043

## 2022-01-17 NOTE — H&P (Signed)
History and Physical    Patient: James Wolf DOB: [birth year] DOA: 01/17/2022 DOS: the patient was seen and examined on 01/18/2022 PCP: Sherrilee Gilles, DO  Patient coming from: Home  Chief Complaint:  Chief Complaint  Patient presents with   Aspiration   HPI: James Wolf is a 72 y.o. male with medical history significant of CAD s/p CABG, COPD, GERD, T2DM, hypertension, hyperlipidemia who presents to the emergency department due to concern for aspiration.  Patient has a history of esophagectomy due to esophageal cancer that did not require chemotherapy or radiation, but has since had difficulty with aspiration.  Patient states that while he was trying to swallow earlier today, he felt that he aspirated something and has since been coughing as well as being short of breath and fatigue which was worsening as the day progressed.  He presents to the emergency department for further evaluation and management.  Patient denies nausea, vomiting or diarrhea.   ED Course:  In the emergency department, he was noted to be tachycardic, but other vital signs were within normal range.  Workup in the ED showed normal CBC except for leukocytosis with a left shift BMP was normal except for blood glucose of 166, AST 46, ALT 56.  Lactic acid 2.0 > 4.4 > 6.6.  Urinalysis was unimpressive for UTI.  Influenza A, B, SARS coronavirus 2, RSV was negative, blood culture was pending Chest x-ray showed no acute findings. He was treated with IV Zosyn and hospitalist was asked to admit patient for further evaluation and management.   Review of Systems: Review of systems as noted in the HPI. All other systems reviewed and are negative.   Past Medical History:  Diagnosis Date   ADHD (attention deficit hyperactivity disorder)    Anemia    Aspiration pneumonia (HCC)    Asthma    exersise induced   CAD (coronary artery disease)    a. s/p CABG 05/2020.   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   LIMA to LAD, SVG to OM, SVG to RCA   Past Surgical History:  Procedure Laterality Date   COLONOSCOPY WITH ESOPHAGOGASTRODUODENOSCOPY (EGD)     CORONARY ARTERY BYPASS GRAFT N/A 06/02/2020   Procedure: CORONARY ARTERY BYPASS GRAFTING (CABG) TIMES THREE USING RIGHT GREATER SAPHENOUS VEIN HARVESTED ENDOSCOPICALLY AND LEFT INTERNAL MAMMARY ARTERY.;  Surgeon: Rexene Alberts, MD;  Location: [place];  Service: Open Heart Surgery;  Laterality: N/A;   CORONARY ARTERY BYPASS GRAFT  06/02/2020   CYSTOSCOPY/RETROGRADE/URETEROSCOPY/STONE EXTRACTION WITH BASKET     ENDARTERECTOMY Right 06/12/2021   Procedure: RIGHT CAROTID ENDARTERECTOMY WITH PATCH ANGIOPLASTY;  Surgeon: Waynetta Sandy, MD;  Location: [place];  Service: Vascular;  Laterality: Right;   ESOPHAGECTOMY     Ivor-Lewis esophagectomy 1995 [facility]   IR THORACENTESIS ASP PLEURAL SPACE W/IMG GUIDE  06/06/2020   LEFT HEART CATH AND CORONARY ANGIOGRAPHY N/A 05/30/2020   Procedure: LEFT HEART CATH AND CORONARY ANGIOGRAPHY;  Surgeon:  Martinique, Peter M, MD;  Location: [place] [facility];  Service: Cardiovascular;  Laterality: N/A;   partial esophageal ejectory     SHOULDER ARTHROSCOPY WITH ROTATOR CUFF REPAIR Left 11/09/2021   Procedure: SHOULDER ARTHROSCOPY WITH ROTATOR CUFF REPAIR BICEPS TENODESIS;  Surgeon: [name], MD;  Location: [facility];  Service: Orthopedics;  Laterality: Left;  120   SHOULDER OPEN ROTATOR CUFF REPAIR Left 10/26/2019   Procedure: ROTATOR CUFF REPAIR SHOULDER OPEN LEFT;  Surgeon: [name], MD;  Location:  [facility];  Service: Orthopedics;  Laterality: Left;    Social History:  reports that he quit smoking about 28 years ago. His smoking use included cigarettes. He has a 25.00 pack-year smoking history. He has never been exposed to tobacco smoke. He has never used smokeless tobacco. He reports current alcohol use. He reports that he does not use drugs.   Allergies  Allergen Reactions   Zithromax [Azithromycin] Diarrhea    Causes pt's stomach to "tear up" does not want to take medication again.     Family History  Problem Relation Age of Onset   Heart disease Mother    CAD Father    CVA Father      Prior to Admission medications   Medication Sig Start Date End Date Taking? Authorizing Provider  acetaminophen (TYLENOL) 500 MG tablet Take 1,000 mg by mouth every 6 (six) hours as needed for moderate pain.   Yes [provider]  albuterol (VENTOLIN HFA) 108 (90 Base) MCG/ACT inhaler Inhale 1-2 puffs into the lungs every 6 (six) hours as needed for wheezing or shortness of breath.   Yes [provider]  aspirin EC 81 MG EC tablet Take 1 tablet (81 mg total) by mouth daily. Swallow whole. 06/08/20  Yes Roddenberry, Myron G, PA-C  B Complex Vitamins (VITAMIN B COMPLEX PO) Take 1 tablet by mouth 2 (two) times daily. 03/10/07  Yes [provider]  cholecalciferol (VITAMIN D3) 25 MCG (1000 UNIT) tablet Take 1,000 Units by mouth 2 (two) times daily.   Yes [provider]  diclofenac sodium (VOLTAREN) 1 % GEL Apply 2 g topically 2 (two) times daily as needed (pain).  09/24/15  Yes [provider]  esomeprazole (NEXIUM) 40 MG capsule Take 40 mg by mouth 2 (two) times daily.  05/26/17  Yes [provider]  ezetimibe (ZETIA) 10 MG tablet TAKE 1 TABLET BY MOUTH EVERY DAY 11/16/21  Yes Branch, Alphonse Guild, MD  glipiZIDE (GLUCOTROL XL) 2.5 MG 24 hr tablet Take 2.5 mg by mouth daily with breakfast. 03/15/21  Yes [provider]  hydrocortisone 2.5 % cream  Apply 1 application. topically 2 (two) times daily as needed for rash. 05/29/21  Yes [provider]  ketoconazole (NIZORAL) 2 % cream Apply 1 application. topically 2 (two) times daily as needed for irritation. 05/29/21  Yes [provider]  lisinopril (ZESTRIL) 2.5 MG tablet Take 2.5 mg by mouth daily. 03/15/21  Yes [provider]  metoprolol tartrate (LOPRESSOR) 100 MG tablet Take 1/2 (one-half) tablet by mouth twice daily Patient taking differently: Take 50 mg by mouth 2 (two) times daily. 07/17/21  Yes Branch, Alphonse Guild, MD  rosuvastatin (CRESTOR) 40 MG tablet TAKE 1 TABLET BY MOUTH ONCE DAILY AT  6  PM Patient taking differently: Take 40 mg by mouth daily. 1800 11/19/21  Yes BranchAlphonse Guild, MD  tamsulosin (FLOMAX) 0.4 MG CAPS capsule Take 0.4 mg by mouth 4 (four) times a week. 03/22/20  Yes [provider]  HYDROcodone-acetaminophen (NORCO/VICODIN) 5-325 MG tablet Take 1 tablet by mouth every 4 (four) hours as needed for moderate pain. Patient not taking: Reported on 01/17/2022 11/09/21 11/09/22  [name], MD    Physical Exam: BP 114/73 (BP Location: Left Arm)   Pulse (!) 127   Temp 98.3 F (36.8 C) (Oral)   Resp 19   Ht '5\' 5"'$  (1.651 m)   Wt 76.7 kg   SpO2 98%   BMI 28.14 kg/m   General: 72 y.o. year-old male ill appearing, but in no acute distress.  Alert and oriented x3. HEENT: NCAT, EOMI Neck: Supple, trachea medial Cardiovascular: Tachycardia.  Regular rate and rhythm with no rubs or gallops.  No thyromegaly or JVD noted.  No lower extremity edema. 2/4 pulses in all 4 extremities. Respiratory: Bilateral rales in lower lobes with some expiratory wheezing.   Abdomen: Soft, nontender nondistended with normal bowel sounds x4 quadrants. Muskuloskeletal: No cyanosis, clubbing or edema noted bilaterally Neuro: CN II-XII intact, strength 5/5 x 4, sensation, reflexes intact Skin: No ulcerative lesions noted or rashes Psychiatry:  Judgement and insight appear normal. Mood is appropriate for condition and setting          Labs on Admission:  Basic Metabolic Panel: Recent Labs  Lab 01/17/22 1838  NA 139  K 3.6  CL 103  CO2 26  GLUCOSE 166*  BUN 17  CREATININE 1.19  CALCIUM 9.1   Liver Function Tests: Recent Labs  Lab 01/17/22 1838  AST 46*  ALT 56*  ALKPHOS 40  BILITOT 0.6  PROT 7.7  ALBUMIN 4.0   No results for input(s): "LIPASE", "AMYLASE" in the last 168 hours. No results for input(s): "AMMONIA" in the last 168 hours. CBC: Recent Labs  Lab 01/17/22 1838  WBC 17.3*  NEUTROABS 15.2*  HGB 13.9  HCT 42.3  MCV 93.6  PLT 161   Cardiac Enzymes: No results for input(s): "CKTOTAL", "CKMB", "CKMBINDEX", "TROPONINI" in the last 168 hours.  BNP (last 3 results) No results for input(s): "BNP" in the last 8760 hours.  ProBNP (last 3 results) No results for input(s): "PROBNP" in the last 8760 hours.  CBG: No results for input(s): "GLUCAP" in the last 168 hours.  Radiological Exams on Admission: DG Chest 2 View  Result Date: 01/17/2022 CLINICAL DATA:  Aspirated due to acid reflux, pt concerned for PNA. Denies any pain. Mild SOB-pt with hx COPD. Generalized weakness EXAM: CHEST - 2 VIEW COMPARISON:  06/14/2021 FINDINGS: Chronic coarse pulmonary interstitial markings with stable chronic appearing pleural and airspace opacities in the lung bases right greater than left. Staple line projects over the right hilum. Heart size and mediastinal contours are within normal limits. Aortic Atherosclerosis (ICD10-170.0). CABG markers. Chronic blunting of the right lateral costophrenic angle. Post right thoracotomy. IMPRESSION: Chronic and postoperative changes as above. No acute findings. Electronically Signed   By: Lucrezia Europe M.D.   On: 01/17/2022 18:20    EKG: I independently viewed the EKG done and my findings are as followed: Sinus tachycardia at a rate of 119 bpm with incomplete  RBBB  Assessment/Plan Present on Admission:  Aspiration pneumonitis (HCC)  COPD (chronic obstructive pulmonary disease) (HCC)  GERD (gastroesophageal reflux disease)  Essential hypertension  Mixed hyperlipidemia  Principal Problem:   Aspiration pneumonitis ([place]) Active Problems:   Mixed hyperlipidemia   Essential hypertension   COPD (chronic obstructive pulmonary disease) (HCC)   GERD (gastroesophageal reflux disease)   S/P CABG x 3  SIRS (systemic inflammatory response syndrome) (HCC)   Prediabetes   Hyperglycemia  Aspiration pneumonitis with suspicion for aspiration pneumonia Patient was started on IV Zosyn. We shall continue with IV Unasyn and doxycycline at this time with plan to de-escalate/discontinue based on blood culture, sputum culture, urine Legionella, strep pneumo and procalcitonin Continue Tylenol as needed Continue Mucinex, Solu-Medrol, Atrovent, incentive spirometry, flutter valve  Continue Protonix to prevent steroid-induced ulcer Continue supplemental oxygen to maintain O2 sat > 92% with plan to wean patient off oxygen as tolerated  SIRS in the setting of above with suspicion for sepsis Patient was tachycardic and presented with leukocytosis Continue management as described above  COPD Continue Mucinex, Solu-Medrol, Atrovent, incentive spirometry, flutter valve   GERD Continue Protonix  Prediabetes with hyperglycemia Hemoglobin A1c down on 10/26/2021 was 6.0 Continue ISS and hypoglycemic protocol  Essential hypertension Continue lisinopril and Lopressor  Hyperlipidemia Continue Zetia and Crestor  CAD s/p CABG Continue aspirin, Crestor, Lopressor  DVT prophylaxis: Lovenox  Code Status: Full code  Family Communication: Wife at bedside (all questions answered to satisfaction)  Consults: None  Severity of Illness:  The appropriate patient status for this patient is OBSERVATION. Observation status is judged to be reasonable and necessary in  order to provide the required intensity of service to ensure the patient's safety. The patient's presenting symptoms, physical exam findings, and initial radiographic and laboratory data in the context of their medical condition is felt to place them at decreased risk for further clinical deterioration. Furthermore, it is anticipated that the patient will be medically stable for discharge from the hospital within 2 midnights of admission.   Author: Bernadette Hoit, DO 01/18/2022 3:50 AM  For on call review [email or web address].

## 2022-01-17 NOTE — Sepsis Progress Note (Signed)
Elink following code sepsis °

## 2022-01-18 DIAGNOSIS — R739 Hyperglycemia, unspecified: Secondary | ICD-10-CM | POA: Insufficient documentation

## 2022-01-18 DIAGNOSIS — I129 Hypertensive chronic kidney disease with stage 1 through stage 4 chronic kidney disease, or unspecified chronic kidney disease: Secondary | ICD-10-CM | POA: Diagnosis present

## 2022-01-18 DIAGNOSIS — E1165 Type 2 diabetes mellitus with hyperglycemia: Secondary | ICD-10-CM | POA: Diagnosis present

## 2022-01-18 DIAGNOSIS — Z1152 Encounter for screening for COVID-19: Secondary | ICD-10-CM | POA: Diagnosis not present

## 2022-01-18 DIAGNOSIS — Z79899 Other long term (current) drug therapy: Secondary | ICD-10-CM | POA: Diagnosis not present

## 2022-01-18 DIAGNOSIS — J4489 Other specified chronic obstructive pulmonary disease: Secondary | ICD-10-CM | POA: Diagnosis present

## 2022-01-18 DIAGNOSIS — R6510 Systemic inflammatory response syndrome (SIRS) of non-infectious origin without acute organ dysfunction: Secondary | ICD-10-CM | POA: Diagnosis present

## 2022-01-18 DIAGNOSIS — M810 Age-related osteoporosis without current pathological fracture: Secondary | ICD-10-CM | POA: Diagnosis present

## 2022-01-18 DIAGNOSIS — E872 Acidosis, unspecified: Secondary | ICD-10-CM | POA: Diagnosis present

## 2022-01-18 DIAGNOSIS — Z823 Family history of stroke: Secondary | ICD-10-CM | POA: Diagnosis not present

## 2022-01-18 DIAGNOSIS — I2510 Atherosclerotic heart disease of native coronary artery without angina pectoris: Secondary | ICD-10-CM | POA: Diagnosis present

## 2022-01-18 DIAGNOSIS — I252 Old myocardial infarction: Secondary | ICD-10-CM | POA: Diagnosis not present

## 2022-01-18 DIAGNOSIS — E782 Mixed hyperlipidemia: Secondary | ICD-10-CM | POA: Diagnosis present

## 2022-01-18 DIAGNOSIS — K219 Gastro-esophageal reflux disease without esophagitis: Secondary | ICD-10-CM | POA: Diagnosis present

## 2022-01-18 DIAGNOSIS — Z8501 Personal history of malignant neoplasm of esophagus: Secondary | ICD-10-CM | POA: Diagnosis not present

## 2022-01-18 DIAGNOSIS — Z951 Presence of aortocoronary bypass graft: Secondary | ICD-10-CM | POA: Diagnosis not present

## 2022-01-18 DIAGNOSIS — Z87891 Personal history of nicotine dependence: Secondary | ICD-10-CM | POA: Diagnosis not present

## 2022-01-18 DIAGNOSIS — R000 Tachycardia, unspecified: Secondary | ICD-10-CM | POA: Diagnosis present

## 2022-01-18 DIAGNOSIS — J690 Pneumonitis due to inhalation of food and vomit: Secondary | ICD-10-CM | POA: Diagnosis present

## 2022-01-18 DIAGNOSIS — E1122 Type 2 diabetes mellitus with diabetic chronic kidney disease: Secondary | ICD-10-CM | POA: Diagnosis present

## 2022-01-18 DIAGNOSIS — Z87442 Personal history of urinary calculi: Secondary | ICD-10-CM | POA: Diagnosis not present

## 2022-01-18 DIAGNOSIS — F909 Attention-deficit hyperactivity disorder, unspecified type: Secondary | ICD-10-CM | POA: Diagnosis present

## 2022-01-18 DIAGNOSIS — R7303 Prediabetes: Secondary | ICD-10-CM | POA: Insufficient documentation

## 2022-01-18 DIAGNOSIS — N1831 Chronic kidney disease, stage 3a: Secondary | ICD-10-CM | POA: Diagnosis present

## 2022-01-18 DIAGNOSIS — Z8249 Family history of ischemic heart disease and other diseases of the circulatory system: Secondary | ICD-10-CM | POA: Diagnosis not present

## 2022-01-18 LAB — GLUCOSE, CAPILLARY
Glucose-Capillary: 148 mg/dL — ABNORMAL HIGH (ref 70–99)
Glucose-Capillary: 132 mg/dL — ABNORMAL HIGH (ref 70–99)
Glucose-Capillary: 171 mg/dL — ABNORMAL HIGH (ref 70–99)
Glucose-Capillary: 175 mg/dL — ABNORMAL HIGH (ref 70–99)

## 2022-01-18 LAB — LACTIC ACID, PLASMA
Lactic Acid, Venous: 6.6 mmol/L (ref 0.5–1.9)
Lactic Acid, Venous: 2.6 mmol/L (ref 0.5–1.9)
Lactic Acid, Venous: 1.5 mmol/L (ref 0.5–1.9)
Lactic Acid, Venous: 1.5 mmol/L (ref 0.5–1.9)

## 2022-01-18 LAB — EXPECTORATED SPUTUM ASSESSMENT W GRAM STAIN, RFLX TO RESP C

## 2022-01-18 LAB — CBC
HCT: 35.2 % — ABNORMAL LOW (ref 39.0–52.0)
Hemoglobin: 11.4 g/dL — ABNORMAL LOW (ref 13.0–17.0)
MCH: 30.5 pg (ref 26.0–34.0)
MCHC: 32.4 g/dL (ref 30.0–36.0)
MCV: 94.1 fL (ref 80.0–100.0)
Platelets: 139 10*3/uL — ABNORMAL LOW (ref 150–400)
RBC: 3.74 MIL/uL — ABNORMAL LOW (ref 4.22–5.81)
RDW: 14.6 % (ref 11.5–15.5)
WBC: 16.7 10*3/uL — ABNORMAL HIGH (ref 4.0–10.5)
nRBC: 0 % (ref 0.0–0.2)

## 2022-01-18 LAB — BASIC METABOLIC PANEL
Anion gap: 9 (ref 5–15)
BUN: 17 mg/dL (ref 8–23)
CO2: 24 mmol/L (ref 22–32)
Calcium: 8.3 mg/dL — ABNORMAL LOW (ref 8.9–10.3)
Chloride: 106 mmol/L (ref 98–111)
Creatinine, Ser: 1.06 mg/dL (ref 0.61–1.24)
GFR, Estimated: >60 mL/min (ref >60)
Glucose, Bld: 173 mg/dL — ABNORMAL HIGH (ref 70–99)
Potassium: 3.4 mmol/L — ABNORMAL LOW (ref 3.5–5.1)
Sodium: 139 mmol/L (ref 135–145)

## 2022-01-18 LAB — PROCALCITONIN: Procalcitonin: 1.07 ng/mL

## 2022-01-18 LAB — STREP PNEUMONIAE URINARY ANTIGEN: Strep Pneumo Urinary Antigen: NEGATIVE

## 2022-01-18 LAB — PHOSPHORUS: Phosphorus: 2.6 mg/dL (ref 2.5–4.6)

## 2022-01-18 LAB — HIV ANTIBODY (ROUTINE TESTING W REFLEX): HIV Screen 4th Generation wRfx: NONREACTIVE

## 2022-01-18 LAB — MAGNESIUM: Magnesium: 1.6 mg/dL — ABNORMAL LOW (ref 1.7–2.4)

## 2022-01-18 MED ORDER — METHYLPREDNISOLONE SODIUM SUCC 40 MG IJ SOLR
40.0000 mg | Freq: Two times a day (BID) | INTRAMUSCULAR | Status: DC
  Administered 2022-01-18 – 2022-01-19 (×3): 40 mg via INTRAVENOUS
  Filled 2022-01-18 (×3): qty 1

## 2022-01-18 MED ORDER — IPRATROPIUM BROMIDE 0.02 % IN SOLN: 0.5000 mg | Freq: Four times a day (QID) | RESPIRATORY_TRACT | Status: DC | PRN

## 2022-01-18 MED ORDER — SODIUM CHLORIDE 0.9 % IV SOLN
100.0000 mg | Freq: Two times a day (BID) | INTRAVENOUS | Status: DC
  Administered 2022-01-18 (×2): 100 mg via INTRAVENOUS
  Filled 2022-01-18 (×5): qty 100

## 2022-01-18 MED ORDER — MAGNESIUM SULFATE 2 GM/50ML IV SOLN
2.0000 g | Freq: Once | INTRAVENOUS | Status: AC
  Administered 2022-01-18 (×2): 2 g via INTRAVENOUS
  Filled 2022-01-18: qty 50

## 2022-01-18 MED ORDER — ACETAMINOPHEN 650 MG RE SUPP: 650.0000 mg | Freq: Four times a day (QID) | RECTAL | Status: DC | PRN

## 2022-01-18 MED ORDER — ALBUTEROL SULFATE (2.5 MG/3ML) 0.083% IN NEBU: 3.0000 mL | INHALATION_SOLUTION | Freq: Four times a day (QID) | RESPIRATORY_TRACT | Status: DC | PRN

## 2022-01-18 MED ORDER — METOPROLOL TARTRATE 50 MG PO TABS
50.0000 mg | ORAL_TABLET | Freq: Two times a day (BID) | ORAL | Status: DC
  Administered 2022-01-18 – 2022-01-19 (×3): 50 mg via ORAL
  Filled 2022-01-18 (×3): qty 1

## 2022-01-18 MED ORDER — ROSUVASTATIN CALCIUM 20 MG PO TABS
40.0000 mg | ORAL_TABLET | Freq: Every day | ORAL | Status: DC
  Administered 2022-01-18: 40 mg via ORAL
  Filled 2022-01-18: qty 2

## 2022-01-18 MED ORDER — ENOXAPARIN SODIUM 40 MG/0.4ML IJ SOSY
40.0000 mg | PREFILLED_SYRINGE | INTRAMUSCULAR | Status: DC
  Administered 2022-01-18 – 2022-01-19 (×2): 40 mg via SUBCUTANEOUS
  Filled 2022-01-18 (×2): qty 0.4

## 2022-01-18 MED ORDER — SODIUM CHLORIDE 0.9 % IV SOLN
3.0000 g | Freq: Four times a day (QID) | INTRAVENOUS | Status: DC
  Administered 2022-01-18 – 2022-01-19 (×5): 3 g via INTRAVENOUS
  Filled 2022-01-18 (×5): qty 8

## 2022-01-18 MED ORDER — EZETIMIBE 10 MG PO TABS
10.0000 mg | ORAL_TABLET | Freq: Every day | ORAL | Status: DC
  Administered 2022-01-18 – 2022-01-19 (×2): 10 mg via ORAL
  Filled 2022-01-18 (×2): qty 1

## 2022-01-18 MED ORDER — INSULIN ASPART 100 UNIT/ML IJ SOLN
0.0000 [IU] | Freq: Three times a day (TID) | INTRAMUSCULAR | Status: DC
  Administered 2022-01-18: 2 [IU] via SUBCUTANEOUS
  Administered 2022-01-18: 3 [IU] via SUBCUTANEOUS
  Administered 2022-01-18 – 2022-01-19 (×2): 2 [IU] via SUBCUTANEOUS

## 2022-01-18 MED ORDER — ONDANSETRON HCL 4 MG/2ML IJ SOLN
4.0000 mg | Freq: Four times a day (QID) | INTRAMUSCULAR | Status: DC | PRN
  Administered 2022-01-18: 4 mg via INTRAVENOUS
  Filled 2022-01-18: qty 2

## 2022-01-18 MED ORDER — ONDANSETRON HCL 4 MG PO TABS: 4.0000 mg | ORAL_TABLET | Freq: Four times a day (QID) | ORAL | Status: DC | PRN

## 2022-01-18 MED ORDER — SODIUM CHLORIDE 0.9 % IV SOLN: INTRAVENOUS | Status: AC

## 2022-01-18 MED ORDER — ASPIRIN 81 MG PO TBEC
81.0000 mg | DELAYED_RELEASE_TABLET | Freq: Every day | ORAL | Status: DC
  Administered 2022-01-18 – 2022-01-19 (×2): 81 mg via ORAL
  Filled 2022-01-18 (×2): qty 1

## 2022-01-18 MED ORDER — ACETAMINOPHEN 325 MG PO TABS: 650.0000 mg | ORAL_TABLET | Freq: Four times a day (QID) | ORAL | Status: DC | PRN

## 2022-01-18 MED ORDER — SODIUM CHLORIDE 0.9 % IV BOLUS
500.0000 mL | Freq: Once | INTRAVENOUS | Status: AC
  Administered 2022-01-18: 500 mL via INTRAVENOUS

## 2022-01-18 MED ORDER — POTASSIUM CHLORIDE 20 MEQ PO PACK
40.0000 meq | PACK | Freq: Once | ORAL | Status: AC
  Administered 2022-01-18: 40 meq via ORAL
  Filled 2022-01-18: qty 2

## 2022-01-18 MED ORDER — DM-GUAIFENESIN ER 30-600 MG PO TB12
1.0000 | ORAL_TABLET | Freq: Two times a day (BID) | ORAL | Status: DC
  Administered 2022-01-18 – 2022-01-19 (×3): 1 via ORAL
  Filled 2022-01-18 (×3): qty 1

## 2022-01-18 MED ORDER — LISINOPRIL 5 MG PO TABS
2.5000 mg | ORAL_TABLET | Freq: Every day | ORAL | Status: DC
  Administered 2022-01-18 – 2022-01-19 (×2): 2.5 mg via ORAL
  Filled 2022-01-18 (×2): qty 1

## 2022-01-18 NOTE — Progress Notes (Addendum)
PROGRESS NOTE    James Wolf  IRJ:[id number] DOB: 23-Nov-1950 DOA: 01/17/2022 PCP: Sherrilee Gilles, DO   Brief Narrative:  This 72 yrs old Male with PMH significant for CAD, s/p CABG, COPD, GERD, type 2 diabetes, hypertension, hyperlipidemia presented in the ED with concerns for aspiration.  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  Patient reports while he was trying to swallow earlier today,  he felt that he has aspirated something and since been coughing as well as having shortness of breath and fatigue which has worsened as the day progressed. Workup in the ED revealed lactic acid 6.6, urine analysis negative for UTI, chest x-ray no acute findings.  Patient admitted for possible aspiration pneumonia and started on IV Zosyn.  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 He reports he has aspirated something earlier today, has developed coughing as well as shortness of breath. Patient empirically started on IV Unasyn and doxycycline. De-escalate antibiotics based on blood culture, sputum culture urine Legionella and strep pneumo antigen. Continue Mucinex, Solu-Medrol, Atrovent. Continue incentive spirometry and flutter valve. Continue Protonix 40 mg daily Continue supplemental oxygen to maintain saturation above 92% and wean as tolerated.  Lactic acidosis: Lactic acid 2.0> 4.4> 6.6> 3.2> 1.7 Resolved with IV fluid resuscitation. Likely secondary to  aspiration pneumonitis.  Sepsis ruled out.  COPD.: Continue Mucinex, Solu-Medrol, Atrovent Continue incentive spirometry, flutter valve. Continue supplemental oxygen  and wean as tolerated.  GERD: Continue pantoprazole.  Hyperglycemia: Last hemoglobin A1c 6.0 consistent with prediabetes. Continue regular insulin sliding scale and hypoglycemic protocol  Essential hypertension Continue lisinopril and Lopressor  Hyperlipidemia: Continue Zetia and Crestor,  CAD s/p CABG: Continue Crestor,  aspirin and Lopressor.  DVT prophylaxis: Lovenox Code Status: Full code Family Communication: No family at bedside Disposition Plan:  Status is: Observation The patient remains OBS appropriate and will d/c before 2 midnights.   Patient admitted for possible aspiration incident leading to aspiration pneumonia and started on IV Unasyn and doxycycline.  Consultants:  None  Procedures: None Antimicrobials:    Anti-infectives (From admission, onward)    Start     Dose/Rate Route Frequency Ordered Stop   01/18/22 0800  Ampicillin-Sulbactam (UNASYN) 3 g in sodium chloride 0.9 % 100 mL IVPB        3 g 200 mL/hr over 30 Minutes Intravenous Every 6 hours 01/18/22 0333     01/18/22 0800  doxycycline (VIBRAMYCIN) 100 mg in sodium chloride 0.9 % 250 mL IVPB        100 mg 125 mL/hr over 120 Minutes Intravenous Every 12 hours 01/18/22 0333     01/17/22 1815  piperacillin-tazobactam (ZOSYN) IVPB 3.375 g        3.375 g 100 mL/hr over 30 Minutes Intravenous  Once 01/17/22 1814 01/17/22 2037       Subjective: Patient was seen and examined at bedside.  Overnight events noted.   Patient reports doing much better, denies any chest pain,  states coughing and shortness of breath has improved.  Objective: Vitals:  01/18/22 0334 01/18/22 0443 01/18/22 0651 01/18/22 1100  BP: 114/73 118/73 128/69 116/63  Pulse: (!) 127 (!) 114 (!) 103 81  Resp: '19 20 18 18  '$ Temp:  98.8 F (37.1 C) 98.8 F (37.1  C) 98 F (36.7 C)  TempSrc:  Oral  Oral  SpO2: 98% 94% 95% 94%  Weight:      Height:        Intake/Output Summary (Last 24 hours) at 01/18/2022 1146 Last data filed at 01/18/2022 0900 Gross per 24 hour  Intake 359.04 ml  Output --  Net 359.04 ml   Filed Weights   01/17/22 1750 01/17/22 2253  Weight: 74.8 kg 76.7 kg    Examination:  General exam: Appears comfortable, not in any acute distress. Respiratory system: CTA bilaterally, respiratory effort normal, RR 14. Cardiovascular system: S1 & S2 heard, regular rate and rhythm, no murmur. Gastrointestinal system: Abdomen is soft, non tender, non distended, BS+ Central nervous system: Alert and oriented x 3 . No focal neurological deficits. Extremities: No edema, no cyanosis, no clubbing. Skin: No rashes, lesions or ulcers Psychiatry: Judgement and insight appear normal. Mood & affect appropriate.     Data Reviewed: I have personally reviewed following labs and imaging studies  CBC: Recent Labs  Lab 01/17/22 1838 01/18/22 0449  WBC 17.3* 16.7*  NEUTROABS 15.2*  --   HGB 13.9 11.4*  HCT 42.3 35.2*  MCV 93.6 94.1  PLT 161 403*   Basic Metabolic Panel: Recent Labs  Lab 01/17/22 1838 01/18/22 0449  NA 139 139  K 3.6 3.4*  CL 103 106  CO2 26 24  GLUCOSE 166* 173*  BUN 17 17  CREATININE 1.19 1.06  CALCIUM 9.1 8.3*  MG  --  1.6*  PHOS  --  2.6   GFR: Estimated Creatinine Clearance: 62 mL/min (by C-G formula based on SCr of 1.06 mg/dL). Liver Function Tests: Recent Labs  Lab 01/17/22 1838  AST 46*  ALT 56*  ALKPHOS 40  BILITOT 0.6  PROT 7.7  ALBUMIN 4.0   No results for input(s): "LIPASE", "AMYLASE" in the last 168 hours. No results for input(s): "AMMONIA" in the last 168 hours. Coagulation Profile: Recent Labs  Lab 01/17/22 1838  INR 1.0   Cardiac Enzymes: No results for input(s): "CKTOTAL", "CKMB", "CKMBINDEX", "TROPONINI" in the last 168 hours. BNP (last 3 results) No results for input(s):  "PROBNP" in the last 8760 hours. HbA1C: No results for input(s): "HGBA1C" in the last 72 hours. CBG: Recent Labs  Lab 01/18/22 0756 01/18/22 1108  GLUCAP 148* 132*   Lipid Profile: No results for input(s): "CHOL", "HDL", "LDLCALC", "TRIG", "CHOLHDL", "LDLDIRECT" in the last 72 hours. Thyroid Function Tests: No results for input(s): "TSH", "T4TOTAL", "FREET4", "T3FREE", "THYROIDAB" in the last 72 hours. Anemia Panel: No results for input(s): "VITAMINB12", "FOLATE", "FERRITIN", "TIBC", "IRON", "RETICCTPCT" in the last 72 hours. Sepsis Labs: Recent Labs  Lab 01/18/22 0131 01/18/22 0449 01/18/22 0744 01/18/22 1052  PROCALCITON 1.07  --   --   --   LATICACIDVEN 6.6* 2.6* 1.5 1.5    Recent Results (from the past 240 hour(s))  Blood Culture (routine x 2)     Status: None (Preliminary result)   Collection Time: 01/17/22  6:38 PM   Specimen: BLOOD  Result Value Ref Range Status   Specimen Description BLOOD BLOOD LEFT ARM  Final   Special Requests   Final    BOTTLES DRAWN AEROBIC AND ANAEROBIC Blood Culture adequate volume Performed at [facility], [address], [place], [place] [address]    Culture PENDING  Incomplete   Report Status PENDING  Incomplete  Blood Culture (routine x 2)     Status: None (Preliminary result)   Collection Time: 01/17/22  6:38 PM   Specimen: BLOOD  Result Value Ref Range Status   Specimen Description BLOOD BLOOD RIGHT ARM  Final   Special Requests   Final    BOTTLES DRAWN AEROBIC AND ANAEROBIC Blood Culture adequate volume Performed at [facility], [address], [place], [place] [address]    Culture PENDING  Incomplete   Report Status PENDING  Incomplete  Resp panel by RT-PCR (RSV, Flu A&B, Covid) Anterior Nasal Swab     Status: None   Collection Time: 01/17/22  8:35 PM   Specimen: Anterior Nasal Swab  Result Value Ref Range Status   SARS Coronavirus 2 by RT PCR NEGATIVE NEGATIVE Final    Comment: (NOTE) SARS-CoV-2 target nucleic acids are  NOT DETECTED.  The SARS-CoV-2 RNA is generally detectable in upper respiratory specimens during the acute phase of infection. The lowest concentration of SARS-CoV-2 viral copies this assay can detect is 138 copies/mL. A negative result does not preclude SARS-Cov-2 infection and should not be used as the sole basis for treatment or other patient management decisions. A negative result may occur with  improper specimen collection/handling, submission of specimen other than nasopharyngeal swab, presence of viral mutation(s) within the areas targeted by this assay, and inadequate number of viral copies(<138 copies/mL). A negative result must be combined with clinical observations, patient history, and epidemiological information. The expected result is Negative.  Fact Sheet for Patients:  EntrepreneurPulse.com.au  Fact Sheet for Healthcare Providers:  IncredibleEmployment.be  This test is no t yet approved or cleared by the [place] FDA and  has been authorized for detection and/or diagnosis of SARS-CoV-2 by FDA under an Emergency Use Authorization (EUA). This EUA will remain  in effect (meaning this test can be used) for the duration of the COVID-19 declaration under Section 564(b)(1) of the Act, 21 U.S.C.section 360bbb-3(b)(1), unless the authorization is terminated  or revoked sooner.       Influenza A by PCR NEGATIVE NEGATIVE Final   Influenza B by PCR NEGATIVE NEGATIVE Final    Comment: (NOTE) The Xpert Xpress SARS-CoV-2/FLU/RSV plus assay is intended as an aid in the diagnosis of influenza from Nasopharyngeal swab specimens and should not be used as a sole basis for treatment. Nasal washings and aspirates are unacceptable for Xpert Xpress SARS-CoV-2/FLU/RSV testing.  Fact Sheet for Patients: EntrepreneurPulse.com.au  Fact Sheet for Healthcare Providers: IncredibleEmployment.be  This test is not  yet approved or cleared by the [place] FDA and has been authorized for detection and/or diagnosis of SARS-CoV-2 by FDA under an Emergency Use Authorization (EUA). This EUA will remain in effect (meaning this test can be used) for the duration of the COVID-19 declaration under Section 564(b)(1) of the Act, 21 U.S.C. section 360bbb-3(b)(1), unless the authorization is terminated or revoked.     Resp Syncytial Virus by PCR NEGATIVE NEGATIVE Final    Comment: (NOTE) Fact Sheet for Patients: EntrepreneurPulse.com.au  Fact Sheet for Healthcare Providers: IncredibleEmployment.be  This test is not yet approved or cleared by the [place] FDA and has been authorized for detection and/or diagnosis of SARS-CoV-2 by FDA under an Emergency Use Authorization (EUA). This EUA will remain in effect (meaning this test can be used) for the duration of the COVID-19 declaration under Section 564(b)(1) of  the Act, 21 U.S.C. section 360bbb-3(b)(1), unless the authorization is terminated or revoked.  Performed at [facility], [address] [place], [place] [address]   Expectorated Sputum Assessment w Gram Stain, Rflx to Resp Cult     Status: None   Collection Time: 01/18/22  5:01 AM   Specimen: Sputum  Result Value Ref Range Status   Specimen Description SPUTUM  Final   Special Requests NONE  Final   Sputum evaluation   Final    Sputum specimen not acceptable for testing.  Please recollect.   Performed at [facility], [address], [place], [place] [address]    Report Status 01/18/2022 FINAL  Final    Radiology Studies: DG Chest 2 View  Result Date: 01/17/2022 CLINICAL DATA:  Aspirated due to acid reflux, pt concerned for PNA. Denies any pain. Mild SOB-pt with hx COPD. Generalized weakness EXAM: CHEST - 2 VIEW COMPARISON:  06/14/2021 FINDINGS: Chronic coarse pulmonary interstitial markings with stable chronic appearing pleural and airspace opacities  in the lung bases right greater than left. Staple line projects over the right hilum. Heart size and mediastinal contours are within normal limits. Aortic Atherosclerosis (ICD10-170.0). CABG markers. Chronic blunting of the right lateral costophrenic angle. Post right thoracotomy. IMPRESSION: Chronic and postoperative changes as above. No acute findings. Electronically Signed   By: Lucrezia Europe M.D.   On: 01/17/2022 18:20    Scheduled Meds:  aspirin EC  81 mg Oral Daily   dextromethorphan-guaiFENesin  1 tablet Oral BID   enoxaparin (LOVENOX) injection  40 mg Subcutaneous Q24H   ezetimibe  10 mg Oral Daily   insulin aspart  0-15 Units Subcutaneous TID WC   lisinopril  2.5 mg Oral Daily   methylPREDNISolone (SOLU-MEDROL) injection  40 mg Intravenous Q12H   metoprolol tartrate  50 mg Oral BID   pantoprazole (PROTONIX) IV  40 mg Intravenous Q12H   rosuvastatin  40 mg Oral QPC supper   Continuous Infusions:  sodium chloride 100 mL/hr at 01/18/22 0233   ampicillin-sulbactam (UNASYN) IV 3 g (01/18/22 0655)   doxycycline (VIBRAMYCIN) IV 100 mg (01/18/22 0848)     LOS: 0 days    Time spent: 50 mins    Francis Yardley, MD [facility]   If 7PM-7AM, please contact night-coverage

## 2022-01-18 NOTE — Progress Notes (Signed)
Date and time results received: 01/18/22 0220   Test: Lactic Acid Critical Value: 6.6   Name of Provider Notified: Dr. Josephine Cables  Orders Received? Or Actions Taken?: Awaiting response from page.

## 2022-01-18 NOTE — Progress Notes (Signed)
Previous nurse left at 1500, noted full bag of magnesium connected to patients IV poll not activated. Attempted to contact previous nurse with no answer. Charge nurse and unit manger Caryl Pina RN made aware. MD Dwyane Dee made aware. Per MD give medication since medication was not activated.

## 2022-01-18 NOTE — Progress Notes (Signed)
Adefeso orders 535m NS bolus and 1031m hr NS continuous. This RN immediately administers. BrBryson CoronauEdd Fabian

## 2022-01-18 NOTE — Sepsis Progress Note (Signed)
Notified provider of need to order repeat lactic acid and fluid bolus.

## 2022-01-18 NOTE — Progress Notes (Signed)
Latest Reference Range & Units 01/17/22 18:38 01/17/22 20:34 01/18/22 01:31 01/18/22 04:49  Lactic Acid, Venous 0.5 - 1.9 mmol/L 2.0 (HH) 4.4 (HH) 6.6 (HH) 2.6 (HH)  (HH): Data is critically high  Called to on call attending Dr. Josephine Cables no new orders at this time

## 2022-01-18 NOTE — Progress Notes (Signed)
  Transition of Care [facility]) Screening Note   Patient Details  Name: James Wolf Date of Birth: [birth year]   Transition of Care [facility]) CM/SW Contact:    Iona Beard, [place] Phone Number: 01/18/2022, 11:32 AM    Transition of Care Department [facility]) has reviewed patient and no TOC needs have been identified at this time. We will continue to monitor patient advancement through interdisciplinary progression rounds. If new patient transition needs arise, please place a TOC consult.

## 2022-01-18 NOTE — Progress Notes (Signed)
Acknowledged new order for lactic acids draw q 3 hours

## 2022-01-18 NOTE — Progress Notes (Addendum)
Patient has been tachycardic in 110's but he's sustaining 128- 135. Just got a full set of vitals that are : T=99.0 Pulse = 127 BP 123/88 R=19 SPO2 97% on RA. He currently denies pain. Patient does not appear to be acute distress. Respirations are symmetrical and unlabored. LR 12m/hr infusing without issues. Overnight coverage, Dr AJosephine Cablesinformed and this RN is currently awaiting further orders. BBryson CoronaSEdd Fabian

## 2022-01-18 NOTE — Progress Notes (Signed)
Pt alert and oriented upon this RN's round. He denies further needs. James Corona Edd Fabian

## 2022-01-18 NOTE — Progress Notes (Signed)
Q 2 VITALS OBTAINED PT ALERT AND ORIENTATED X 4 ABLE TO MAKE NEEDS KNOWN. PT DENIES PAIN OR DISCOMFORT. PLACED SPUTUM AND URINE COLLECTION TUBES IN ROOM, PT MADE AWARE OF THE REASON AND THE LABS TO BE OBTAINED.

## 2022-01-19 DIAGNOSIS — J690 Pneumonitis due to inhalation of food and vomit: Secondary | ICD-10-CM | POA: Diagnosis not present

## 2022-01-19 LAB — CBC WITH DIFFERENTIAL/PLATELET
Abs Immature Granulocytes: 0.2 10*3/uL — ABNORMAL HIGH (ref 0.00–0.07)
Basophils Absolute: 0 10*3/uL (ref 0.0–0.1)
Basophils Relative: 0 %
Eosinophils Absolute: 0 10*3/uL (ref 0.0–0.5)
Eosinophils Relative: 0 %
HCT: 34.9 % — ABNORMAL LOW (ref 39.0–52.0)
Hemoglobin: 11.1 g/dL — ABNORMAL LOW (ref 13.0–17.0)
Immature Granulocytes: 1 %
Lymphocytes Relative: 5 %
Lymphs Abs: 0.8 10*3/uL (ref 0.7–4.0)
MCH: 30.6 pg (ref 26.0–34.0)
MCHC: 31.8 g/dL (ref 30.0–36.0)
MCV: 96.1 fL (ref 80.0–100.0)
Monocytes Absolute: 1 10*3/uL (ref 0.1–1.0)
Monocytes Relative: 6 %
Neutro Abs: 15.4 10*3/uL — ABNORMAL HIGH (ref 1.7–7.7)
Neutrophils Relative %: 88 %
Platelets: 139 10*3/uL — ABNORMAL LOW (ref 150–400)
RBC: 3.63 MIL/uL — ABNORMAL LOW (ref 4.22–5.81)
RDW: 15 % (ref 11.5–15.5)
WBC: 17.4 10*3/uL — ABNORMAL HIGH (ref 4.0–10.5)
nRBC: 0 % (ref 0.0–0.2)

## 2022-01-19 LAB — BASIC METABOLIC PANEL
Anion gap: 6 (ref 5–15)
BUN: 23 mg/dL (ref 8–23)
CO2: 24 mmol/L (ref 22–32)
Calcium: 8 mg/dL — ABNORMAL LOW (ref 8.9–10.3)
Chloride: 109 mmol/L (ref 98–111)
Creatinine, Ser: 1.1 mg/dL (ref 0.61–1.24)
GFR, Estimated: >60 mL/min (ref >60)
Glucose, Bld: 168 mg/dL — ABNORMAL HIGH (ref 70–99)
Potassium: 4 mmol/L (ref 3.5–5.1)
Sodium: 139 mmol/L (ref 135–145)

## 2022-01-19 LAB — GLUCOSE, CAPILLARY: Glucose-Capillary: 175 mg/dL — ABNORMAL HIGH (ref 70–99)

## 2022-01-19 LAB — URINE CULTURE: Culture: NO GROWTH

## 2022-01-19 LAB — MAGNESIUM: Magnesium: 2.3 mg/dL (ref 1.7–2.4)

## 2022-01-19 MED ORDER — PREDNISONE 50 MG PO TABS: 50.0000 mg | ORAL_TABLET | Freq: Every day | ORAL | 0 refills | Status: AC

## 2022-01-19 MED ORDER — AMOXICILLIN-POT CLAVULANATE 875-125 MG PO TABS: 1.0000 | ORAL_TABLET | Freq: Two times a day (BID) | ORAL | 0 refills | Status: AC

## 2022-01-19 NOTE — Discharge Summary (Signed)
Physician Discharge Summary  James Wolf DJM:[id number] DOB: 1950-07-13 DOA: 01/17/2022  PCP: Sherrilee Gilles, DO  Admit date: 01/17/2022 Discharge date: 01/19/2022 30 Day Unplanned Readmission Risk Score    Flowsheet Row ED to Hosp-Admission (Current) from 01/17/2022 in [facility]  30 Day Unplanned Readmission Risk Score (%) 15.5 Filed at 01/19/2022 0800       This score is the patient's risk of an unplanned readmission within 30 days of being discharged (0 -100%). The score is based on dignosis, age, lab data, medications, orders, and past utilization.   Low:  0-14.9   Medium: 15-21.9   High: 22-29.9   Extreme: 30 and above          Admitted From: Home Disposition: Home  Recommendations for Outpatient Follow-up:  Follow up with PCP in 1-2 weeks Please obtain BMP/CBC in one week Please follow up with your PCP on the following pending results: Unresulted Labs (From admission, onward)     Start     Ordered   01/25/22 0500  Creatinine, serum  (enoxaparin (LOVENOX)    CrCl >/= 30 ml/min)  Weekly,   R     Comments: while on enoxaparin therapy    01/18/22 0328   01/19/22 0750  CBC with Differential/Platelet  Once,   R       Question:  Specimen collection method  Answer:  Lab=Lab collect   01/19/22 0749   01/19/22 2229  Basic metabolic panel  Once,   R       Question:  Specimen collection method  Answer:  Lab=Lab collect   01/19/22 0749   01/19/22 0750  Magnesium  Once,   R       Question:  Specimen collection method  Answer:  Lab=Lab collect   01/19/22 0749   01/18/22 0325  Legionella Pneumophila Serogp 1 Ur Ag  (COPD / Pneumonia / Cellulitis / Lower Extremity Wound)  Once,   R        01/18/22 0328   01/17/22 1814  Urine Culture  (Septic presentation on arrival (screening labs, nursing and treatment orders for obvious sepsis))  ONCE - URGENT,   URGENT       Question:  Indication  Answer:  Sepsis   01/17/22 Wall:  None Equipment/Devices: None  Discharge Condition: Stable CODE STATUS: Full code Diet recommendation: Cardiac  Subjective: Seen and examined.  He says that he feels well and he feels ready to go home.  No complaints.  Brief/Interim Summary: This 72 yrs old Male with PMH significant for CAD, s/p CABG, COPD, GERD, type 2 diabetes, hypertension, hyperlipidemia presented in the ED with concerns for aspiration.  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  Patient reports while he was trying to swallow earlier today,  he felt that he has aspirated something and since been coughing as well as having shortness of breath and fatigue which has worsened as the day progressed. Workup in the ED revealed lactic acid 6.6, urine analysis negative for UTI, chest x-ray no acute findings.  Patient admitted for possible aspiration pneumonia vs pneumonitis and started on IV Zosyn as well as IV Solu-Medrol.  All cultures remain negative, he received IV fluids, lactic acidosis resolved as well.  His procalcitonin was 1.07.  He did not have acute COPD exacerbation,  no wheezes.  He was started on steroids for pneumonitis.  He is feeling much better, he did not require any oxygen, was never hypoxic.  Lungs are clear to auscultation today so he is going to be discharged to home.  I will discharge him on 4 more days of oral prednisone and 7 more days of oral Augmentin to complete the course.   GERD: Continue pantoprazole.   Hyperglycemia: Last hemoglobin A1c 6.0 consistent with prediabetes. Continue regular insulin sliding scale and hypoglycemic protocol   Essential hypertension Continue lisinopril and Lopressor   Hyperlipidemia: Continue Zetia and Crestor,   CAD s/p CABG: Continue Crestor,  aspirin and Lopressor.  Discharge plan was discussed with patient and/or family member and they  verbalized understanding and agreed with it.  Discharge Diagnoses:  Principal Problem:   Aspiration pneumonitis ([place]) Active Problems:   Mixed hyperlipidemia   Essential hypertension   COPD (chronic obstructive pulmonary disease) (H[facility])   GERD (gastroesophageal reflux disease)   S/P CABG x 3   SIRS (systemic inflammatory response syndrome) ([place])   Prediabetes   Hyperglycemia    Discharge Instructions   Allergies as of 01/19/2022       Reactions   Zithromax [azithromycin] Diarrhea   Causes pt's stomach to "tear up" does not want to take medication again.         Medication List     STOP taking these medications    HYDROcodone-acetaminophen 5-325 MG tablet Commonly known as: NORCO/VICODIN       TAKE these medications    acetaminophen 500 MG tablet Commonly known as: TYLENOL Take 1,000 mg by mouth every 6 (six) hours as needed for moderate pain.   albuterol 108 (90 Base) MCG/ACT inhaler Commonly known as: VENTOLIN HFA Inhale 1-2 puffs into the lungs every 6 (six) hours as needed for wheezing or shortness of breath.   amoxicillin-clavulanate 875-125 MG tablet Commonly known as: AUGMENTIN Take 1 tablet by mouth 2 (two) times daily for 7 days.   aspirin EC 81 MG tablet Take 1 tablet (81 mg total) by mouth daily. Swallow whole.   cholecalciferol 25 MCG (1000 UNIT) tablet Commonly known as: VITAMIN D3 Take 1,000 Units by mouth 2 (two) times daily.   diclofenac sodium 1 % Gel Commonly known as: VOLTAREN Apply 2 g topically 2 (two) times daily as needed (pain).   esomeprazole 40 MG capsule Commonly known as: NEXIUM Take 40 mg by mouth 2 (two) times daily.   ezetimibe 10 MG tablet Commonly known as: ZETIA TAKE 1 TABLET BY MOUTH EVERY DAY   glipiZIDE 2.5 MG 24 hr tablet Commonly known as: GLUCOTROL XL Take 2.5 mg by mouth daily with breakfast.   hydrocortisone 2.5 % cream Apply 1 application. topically 2 (two) times daily as needed for rash.    ketoconazole 2 % cream Commonly known as: NIZORAL Apply 1 application. topically 2 (two) times daily as needed for irritation.   lisinopril 2.5 MG tablet Commonly known as: ZESTRIL Take 2.5 mg by mouth daily.   metoprolol tartrate 100 MG tablet Commonly known as: LOPRESSOR Take 1/2 (one-half) tablet by mouth twice daily What changed: See the new instructions.   predniSONE 50 MG tablet Commonly known as: DELTASONE Take 1 tablet (50 mg total) by mouth daily with breakfast for 5 days.   rosuvastatin 40 MG tablet Commonly known as: CRESTOR TAKE 1 TABLET BY MOUTH ONCE DAILY AT  6  PM What changed: See the new instructions.   tamsulosin 0.4 MG Caps  capsule Commonly known as: FLOMAX Take 0.4 mg by mouth 4 (four) times a week.   VITAMIN B COMPLEX PO Take 1 tablet by mouth 2 (two) times daily.        Follow-up Information     Sherrilee Gilles, DO Follow up in 1 week(s).   Specialty: Family Medicine Contact information: [place] [address] [phone]                Allergies  Allergen Reactions   Zithromax [Azithromycin] Diarrhea    Causes pt's stomach to "tear up" does not want to take medication again.     Consultations: None   Procedures/Studies: DG Chest 2 View  Result Date: 01/17/2022 CLINICAL DATA:  Aspirated due to acid reflux, pt concerned for PNA. Denies any pain. Mild SOB-pt with hx COPD. Generalized weakness EXAM: CHEST - 2 VIEW COMPARISON:  06/14/2021 FINDINGS: Chronic coarse pulmonary interstitial markings with stable chronic appearing pleural and airspace opacities in the lung bases right greater than left. Staple line projects over the right hilum. Heart size and mediastinal contours are within normal limits. Aortic Atherosclerosis (ICD10-170.0). CABG markers. Chronic blunting of the right lateral costophrenic angle. Post right thoracotomy. IMPRESSION: Chronic and postoperative changes as above. No acute findings. Electronically Signed    By: Lucrezia Europe M.D.   On: 01/17/2022 18:20     Discharge Exam: Vitals:   01/18/22 2023 01/19/22 0342  BP: 120/67 125/64  Pulse: 77 73  Resp: 20 17  Temp: 98.1 F (36.7 C) 98.1 F (36.7 C)  SpO2: 95% 93%   Vitals:   01/18/22 1300 01/18/22 2007 01/18/22 2023 01/19/22 0342  BP: 110/67 (!) 106/53 120/67 125/64  Pulse: 83 69 77 73  Resp: '20 20 20 17  '$ Temp: 98.3 F (36.8 C) 98.4 F (36.9 C) 98.1 F (36.7 C) 98.1 F (36.7 C)  TempSrc: Oral Oral Oral Oral  SpO2: 93% 94% 95% 93%  Weight:      Height:        General: Pt is alert, awake, not in acute distress Cardiovascular: RRR, S1/S2 +, no rubs, no gallops Respiratory: CTA bilaterally, no wheezing, no rhonchi Abdominal: Soft, NT, ND, bowel sounds + Extremities: no edema, no cyanosis    The results of significant diagnostics from this hospitalization (including imaging, microbiology, ancillary and laboratory) are listed below for reference.     Microbiology: Recent Results (from the past 240 hour(s))  Blood Culture (routine x 2)     Status: None (Preliminary result)   Collection Time: 01/17/22  6:38 PM   Specimen: BLOOD  Result Value Ref Range Status   Specimen Description BLOOD BLOOD LEFT ARM  Final   Special Requests   Final    BOTTLES DRAWN AEROBIC AND ANAEROBIC Blood Culture adequate volume   Culture   Final    NO GROWTH 2 DAYS Performed at [facility], [address], [place], [place] [address]    Report Status PENDING  Incomplete  Blood Culture (routine x 2)     Status: None (Preliminary result)   Collection Time: 01/17/22  6:38 PM   Specimen: BLOOD  Result Value Ref Range Status   Specimen Description BLOOD BLOOD RIGHT ARM  Final   Special Requests   Final    BOTTLES DRAWN AEROBIC AND ANAEROBIC Blood Culture adequate volume   Culture   Final    NO GROWTH 2 DAYS Performed at [facility], [address] [place], [place] [address]    Report Status  PENDING  Incomplete  Resp panel by RT-PCR (RSV, Flu A&B,  Covid) Anterior Nasal Swab     Status: None   Collection Time: 01/17/22  8:35 PM   Specimen: Anterior Nasal Swab  Result Value Ref Range Status   SARS Coronavirus 2 by RT PCR NEGATIVE NEGATIVE Final    Comment: (NOTE) SARS-CoV-2 target nucleic acids are NOT DETECTED.  The SARS-CoV-2 RNA is generally detectable in upper respiratory specimens during the acute phase of infection. The lowest concentration of SARS-CoV-2 viral copies this assay can detect is 138 copies/mL. A negative result does not preclude SARS-Cov-2 infection and should not be used as the sole basis for treatment or other patient management decisions. A negative result may occur with  improper specimen collection/handling, submission of specimen other than nasopharyngeal swab, presence of viral mutation(s) within the areas targeted by this assay, and inadequate number of viral copies(<138 copies/mL). A negative result must be combined with clinical observations, patient history, and epidemiological information. The expected result is Negative.  Fact Sheet for Patients:  EntrepreneurPulse.com.au  Fact Sheet for Healthcare Providers:  IncredibleEmployment.be  This test is no t yet approved or cleared by the [place] FDA and  has been authorized for detection and/or diagnosis of SARS-CoV-2 by FDA under an Emergency Use Authorization (EUA). This EUA will remain  in effect (meaning this test can be used) for the duration of the COVID-19 declaration under Section 564(b)(1) of the Act, 21 U.S.C.section 360bbb-3(b)(1), unless the authorization is terminated  or revoked sooner.       Influenza A by PCR NEGATIVE NEGATIVE Final   Influenza B by PCR NEGATIVE NEGATIVE Final    Comment: (NOTE) The Xpert Xpress SARS-CoV-2/FLU/RSV plus assay is intended as an aid in the diagnosis of influenza from Nasopharyngeal swab specimens and should not be used as a sole basis for treatment. Nasal  washings and aspirates are unacceptable for Xpert Xpress SARS-CoV-2/FLU/RSV testing.  Fact Sheet for Patients: EntrepreneurPulse.com.au  Fact Sheet for Healthcare Providers: IncredibleEmployment.be  This test is not yet approved or cleared by the [place] FDA and has been authorized for detection and/or diagnosis of SARS-CoV-2 by FDA under an Emergency Use Authorization (EUA). This EUA will remain in effect (meaning this test can be used) for the duration of the COVID-19 declaration under Section 564(b)(1) of the Act, 21 U.S.C. section 360bbb-3(b)(1), unless the authorization is terminated or revoked.     Resp Syncytial Virus by PCR NEGATIVE NEGATIVE Final    Comment: (NOTE) Fact Sheet for Patients: EntrepreneurPulse.com.au  Fact Sheet for Healthcare Providers: IncredibleEmployment.be  This test is not yet approved or cleared by the [place] FDA and has been authorized for detection and/or diagnosis of SARS-CoV-2 by FDA under an Emergency Use Authorization (EUA). This EUA will remain in effect (meaning this test can be used) for the duration of the COVID-19 declaration under Section 564(b)(1) of the Act, 21 U.S.C. section 360bbb-3(b)(1), unless the authorization is terminated or revoked.  Performed at [facility], [address] [place], [place] [address]   Expectorated Sputum Assessment w Gram Stain, Rflx to Resp Cult     Status: None   Collection Time: 01/18/22  5:01 AM   Specimen: Sputum  Result Value Ref Range Status   Specimen Description SPUTUM  Final   Special Requests NONE  Final   Sputum evaluation   Final    Sputum specimen not acceptable for testing.  Please recollect.   Performed at [facility], [address]  [place], [place] [address]    Report Status 01/18/2022 FINAL  Final     Labs: BNP (last 3 results) No results for input(s): "BNP" in the last 8760 hours. Basic  Metabolic Panel: Recent Labs  Lab 01/17/22 1838 01/18/22 0449  NA 139 139  K 3.6 3.4*  CL 103 106  CO2 26 24  GLUCOSE 166* 173*  BUN 17 17  CREATININE 1.19 1.06  CALCIUM 9.1 8.3*  MG  --  1.6*  PHOS  --  2.6   Liver Function Tests: Recent Labs  Lab 01/17/22 1838  AST 46*  ALT 56*  ALKPHOS 40  BILITOT 0.6  PROT 7.7  ALBUMIN 4.0   No results for input(s): "LIPASE", "AMYLASE" in the last 168 hours. No results for input(s): "AMMONIA" in the last 168 hours. CBC: Recent Labs  Lab 01/17/22 1838 01/18/22 0449  WBC 17.3* 16.7*  NEUTROABS 15.2*  --   HGB 13.9 11.4*  HCT 42.3 35.2*  MCV 93.6 94.1  PLT 161 139*   Cardiac Enzymes: No results for input(s): "CKTOTAL", "CKMB", "CKMBINDEX", "TROPONINI" in the last 168 hours. BNP: Invalid input(s): "POCBNP" CBG: Recent Labs  Lab 01/18/22 0756 01/18/22 1108 01/18/22 1625 01/18/22 2010 01/19/22 0712  GLUCAP 148* 132* 171* 175* 175*   D-Dimer No results for input(s): "DDIMER" in the last 72 hours. Hgb A1c No results for input(s): "HGBA1C" in the last 72 hours. Lipid Profile No results for input(s): "CHOL", "HDL", "LDLCALC", "TRIG", "CHOLHDL", "LDLDIRECT" in the last 72 hours. Thyroid function studies No results for input(s): "TSH", "T4TOTAL", "T3FREE", "THYROIDAB" in the last 72 hours.  Invalid input(s): "FREET3" Anemia work up No results for input(s): "VITAMINB12", "FOLATE", "FERRITIN", "TIBC", "IRON", "RETICCTPCT" in the last 72 hours. Urinalysis    Component Value Date/Time   COLORURINE YELLOW 01/17/2022 2126   APPEARANCEUR HAZY (A) 01/17/2022 2126   LABSPEC 1.026 01/17/2022 2126   PHURINE 5.0 01/17/2022 2126   GLUCOSEU NEGATIVE 01/17/2022 2126   HGBUR NEGATIVE 01/17/2022 2126   BILIRUBINUR NEGATIVE 01/17/2022 2126   KETONESUR 20 (A) 01/17/2022 2126   PROTEINUR 30 (A) 01/17/2022 2126   NITRITE NEGATIVE 01/17/2022 2126   LEUKOCYTESUR TRACE (A) 01/17/2022 2126   Sepsis Labs Recent Labs  Lab  01/17/22 1838 01/18/22 0449  WBC 17.3* 16.7*   Microbiology Recent Results (from the past 240 hour(s))  Blood Culture (routine x 2)     Status: None (Preliminary result)   Collection Time: 01/17/22  6:38 PM   Specimen: BLOOD  Result Value Ref Range Status   Specimen Description BLOOD BLOOD LEFT ARM  Final   Special Requests   Final    BOTTLES DRAWN AEROBIC AND ANAEROBIC Blood Culture adequate volume   Culture   Final    NO GROWTH 2 DAYS Performed at [facility], [address], [place], [place] [address]    Report Status PENDING  Incomplete  Blood Culture (routine x 2)     Status: None (Preliminary result)   Collection Time: 01/17/22  6:38 PM   Specimen: BLOOD  Result Value Ref Range Status   Specimen Description BLOOD BLOOD RIGHT ARM  Final   Special Requests   Final    BOTTLES DRAWN AEROBIC AND ANAEROBIC Blood Culture adequate volume   Culture   Final    NO GROWTH 2 DAYS Performed at [facility], [address] [place], [place] [address]    Report Status PENDING  Incomplete  Resp panel by RT-PCR (RSV, Flu A&B, Covid) Anterior Nasal Swab  Status: None   Collection Time: 01/17/22  8:35 PM   Specimen: Anterior Nasal Swab  Result Value Ref Range Status   SARS Coronavirus 2 by RT PCR NEGATIVE NEGATIVE Final    Comment: (NOTE) SARS-CoV-2 target nucleic acids are NOT DETECTED.  The SARS-CoV-2 RNA is generally detectable in upper respiratory specimens during the acute phase of infection. The lowest concentration of SARS-CoV-2 viral copies this assay can detect is 138 copies/mL. A negative result does not preclude SARS-Cov-2 infection and should not be used as the sole basis for treatment or other patient management decisions. A negative result may occur with  improper specimen collection/handling, submission of specimen other than nasopharyngeal swab, presence of viral mutation(s) within the areas targeted by this assay, and inadequate number of viral copies(<138  copies/mL). A negative result must be combined with clinical observations, patient history, and epidemiological information. The expected result is Negative.  Fact Sheet for Patients:  EntrepreneurPulse.com.au  Fact Sheet for Healthcare Providers:  IncredibleEmployment.be  This test is no t yet approved or cleared by the [place] FDA and  has been authorized for detection and/or diagnosis of SARS-CoV-2 by FDA under an Emergency Use Authorization (EUA). This EUA will remain  in effect (meaning this test can be used) for the duration of the COVID-19 declaration under Section 564(b)(1) of the Act, 21 U.S.C.section 360bbb-3(b)(1), unless the authorization is terminated  or revoked sooner.       Influenza A by PCR NEGATIVE NEGATIVE Final   Influenza B by PCR NEGATIVE NEGATIVE Final    Comment: (NOTE) The Xpert Xpress SARS-CoV-2/FLU/RSV plus assay is intended as an aid in the diagnosis of influenza from Nasopharyngeal swab specimens and should not be used as a sole basis for treatment. Nasal washings and aspirates are unacceptable for Xpert Xpress SARS-CoV-2/FLU/RSV testing.  Fact Sheet for Patients: EntrepreneurPulse.com.au  Fact Sheet for Healthcare Providers: IncredibleEmployment.be  This test is not yet approved or cleared by the [place] FDA and has been authorized for detection and/or diagnosis of SARS-CoV-2 by FDA under an Emergency Use Authorization (EUA). This EUA will remain in effect (meaning this test can be used) for the duration of the COVID-19 declaration under Section 564(b)(1) of the Act, 21 U.S.C. section 360bbb-3(b)(1), unless the authorization is terminated or revoked.     Resp Syncytial Virus by PCR NEGATIVE NEGATIVE Final    Comment: (NOTE) Fact Sheet for Patients: EntrepreneurPulse.com.au  Fact Sheet for Healthcare  Providers: IncredibleEmployment.be  This test is not yet approved or cleared by the [place] FDA and has been authorized for detection and/or diagnosis of SARS-CoV-2 by FDA under an Emergency Use Authorization (EUA). This EUA will remain in effect (meaning this test can be used) for the duration of the COVID-19 declaration under Section 564(b)(1) of the Act, 21 U.S.C. section 360bbb-3(b)(1), unless the authorization is terminated or revoked.  Performed at [facility], [address], [place], [place] [address]   Expectorated Sputum Assessment w Gram Stain, Rflx to Resp Cult     Status: None   Collection Time: 01/18/22  5:01 AM   Specimen: Sputum  Result Value Ref Range Status   Specimen Description SPUTUM  Final   Special Requests NONE  Final   Sputum evaluation   Final    Sputum specimen not acceptable for testing.  Please recollect.   Performed at [facility], [address], [place], [place] [address]    Report Status 01/18/2022 FINAL  Final     Time coordinating discharge:  Over 30 minutes  SIGNED:   Darliss Cheney, MD  [facility] 01/19/2022, 8:41 AM *Please note that this is a verbal dictation therefore any spelling or grammatical errors are due to the "[employer]" system interpretation. If 7PM-7AM, please contact night-coverage [email or web address]

## 2022-01-21 LAB — LEGIONELLA PNEUMOPHILA SEROGP 1 UR AG: L. pneumophila Serogp 1 Ur Ag: NEGATIVE

## 2022-01-22 LAB — CULTURE, BLOOD (ROUTINE X 2)
Culture: NO GROWTH
Culture: NO GROWTH
Special Requests: ADEQUATE
Special Requests: ADEQUATE

## 2022-02-19 ENCOUNTER — Telehealth: Payer: Self-pay | Admitting: Cardiology

## 2022-02-19 MED ORDER — ROSUVASTATIN CALCIUM 40 MG PO TABS: ORAL_TABLET | ORAL | 1 refills | Status: DC

## 2022-02-19 NOTE — Telephone Encounter (Signed)
*  STAT* If patient is at the pharmacy, call can be transferred to refill team.   1. Which medications need to be refilled? (please list name of each medication and dose if known)  rosuvastatin (CRESTOR) 40 MG tablet  2. Which pharmacy/location (including street and city if local pharmacy) is medication to be sent to? [place], [place]   3. Do they need a 30 day or 90 day supply?  90 day supply

## 2022-03-05 ENCOUNTER — Emergency Department (HOSPITAL_COMMUNITY)
Admission: EM | Admit: 2022-03-05 | Discharge: 2022-03-05 | Disposition: A | Discharge status: Home / Self Care | Payer: Medicare Other | Attending: Emergency Medicine | Admitting: Emergency Medicine

## 2022-03-05 ENCOUNTER — Emergency Department (HOSPITAL_COMMUNITY): Payer: Medicare Other

## 2022-03-05 ENCOUNTER — Other Ambulatory Visit: Payer: Self-pay

## 2022-03-05 ENCOUNTER — Encounter (HOSPITAL_COMMUNITY): Payer: Self-pay | Admitting: Emergency Medicine

## 2022-03-05 DIAGNOSIS — R109 Unspecified abdominal pain: Secondary | ICD-10-CM

## 2022-03-05 DIAGNOSIS — I129 Hypertensive chronic kidney disease with stage 1 through stage 4 chronic kidney disease, or unspecified chronic kidney disease: Secondary | ICD-10-CM | POA: Insufficient documentation

## 2022-03-05 DIAGNOSIS — J449 Chronic obstructive pulmonary disease, unspecified: Secondary | ICD-10-CM | POA: Diagnosis not present

## 2022-03-05 DIAGNOSIS — Z8501 Personal history of malignant neoplasm of esophagus: Secondary | ICD-10-CM | POA: Diagnosis not present

## 2022-03-05 DIAGNOSIS — Z951 Presence of aortocoronary bypass graft: Secondary | ICD-10-CM | POA: Diagnosis not present

## 2022-03-05 DIAGNOSIS — I2510 Atherosclerotic heart disease of native coronary artery without angina pectoris: Secondary | ICD-10-CM | POA: Insufficient documentation

## 2022-03-05 DIAGNOSIS — N1831 Chronic kidney disease, stage 3a: Secondary | ICD-10-CM | POA: Diagnosis not present

## 2022-03-05 DIAGNOSIS — N200 Calculus of kidney: Secondary | ICD-10-CM

## 2022-03-05 DIAGNOSIS — Z87891 Personal history of nicotine dependence: Secondary | ICD-10-CM | POA: Insufficient documentation

## 2022-03-05 LAB — COMPREHENSIVE METABOLIC PANEL
ALT: 34 U/L (ref 0–44)
AST: 31 U/L (ref 15–41)
Albumin: 3.7 g/dL (ref 3.5–5.0)
Alkaline Phosphatase: 38 U/L (ref 38–126)
Anion gap: 10 (ref 5–15)
BUN: 14 mg/dL (ref 8–23)
CO2: 26 mmol/L (ref 22–32)
Calcium: 9 mg/dL (ref 8.9–10.3)
Chloride: 101 mmol/L (ref 98–111)
Creatinine, Ser: 1.09 mg/dL (ref 0.61–1.24)
GFR, Estimated: >60 mL/min (ref >60)
Glucose, Bld: 144 mg/dL — ABNORMAL HIGH (ref 70–99)
Potassium: 3.6 mmol/L (ref 3.5–5.1)
Sodium: 137 mmol/L (ref 135–145)
Total Bilirubin: 0.7 mg/dL (ref 0.3–1.2)
Total Protein: 6.7 g/dL (ref 6.5–8.1)

## 2022-03-05 LAB — URINALYSIS, ROUTINE W REFLEX MICROSCOPIC
Bacteria, UA: NONE SEEN
Bilirubin Urine: NEGATIVE
Glucose, UA: NEGATIVE mg/dL
Ketones, ur: 5 mg/dL — AB
Leukocytes,Ua: NEGATIVE
Nitrite: NEGATIVE
Protein, ur: 30 mg/dL — AB
RBC / HPF: >50 RBC/hpf (ref 0–5)
Specific Gravity, Urine: 1.019 (ref 1.005–1.030)
pH: 5 (ref 5.0–8.0)

## 2022-03-05 LAB — CBC
HCT: 39.5 % (ref 39.0–52.0)
Hemoglobin: 12.8 g/dL — ABNORMAL LOW (ref 13.0–17.0)
MCH: 30.7 pg (ref 26.0–34.0)
MCHC: 32.4 g/dL (ref 30.0–36.0)
MCV: 94.7 fL (ref 80.0–100.0)
Platelets: 161 10*3/uL (ref 150–400)
RBC: 4.17 MIL/uL — ABNORMAL LOW (ref 4.22–5.81)
RDW: 13.7 % (ref 11.5–15.5)
WBC: 10.7 10*3/uL — ABNORMAL HIGH (ref 4.0–10.5)
nRBC: 0 % (ref 0.0–0.2)

## 2022-03-05 MED ORDER — ONDANSETRON HCL 4 MG/2ML IJ SOLN
4.0000 mg | Freq: Once | INTRAMUSCULAR | Status: AC
  Administered 2022-03-05: 4 mg via INTRAVENOUS
  Filled 2022-03-05: qty 2

## 2022-03-05 MED ORDER — SODIUM CHLORIDE 0.9 % IV BOLUS
1000.0000 mL | Freq: Once | INTRAVENOUS | Status: AC
  Administered 2022-03-05: 1000 mL via INTRAVENOUS

## 2022-03-05 MED ORDER — HYDROMORPHONE HCL 1 MG/ML IJ SOLN
1.0000 mg | Freq: Once | INTRAMUSCULAR | Status: AC
  Administered 2022-03-05: 1 mg via INTRAVENOUS
  Filled 2022-03-05: qty 1

## 2022-03-05 MED ORDER — TAMSULOSIN HCL 0.4 MG PO CAPS: 0.4000 mg | ORAL_CAPSULE | Freq: Every day | ORAL | 0 refills | Status: AC

## 2022-03-05 MED ORDER — NAPROXEN 500 MG PO TABS: 500.0000 mg | ORAL_TABLET | Freq: Two times a day (BID) | ORAL | 0 refills | Status: DC

## 2022-03-05 MED ORDER — OXYCODONE HCL 5 MG PO TABS: 5.0000 mg | ORAL_TABLET | ORAL | 0 refills | Status: DC | PRN

## 2022-03-05 NOTE — ED Provider Notes (Signed)
[facility] Emergency Department Provider Note MRN:  [id number]  Arrival date & time: 03/05/22     Chief Complaint   Flank Pain   History of Present Illness   James Wolf is a 72 y.o. year-old male with a history of CAD, kidney stone presenting to the ED with chief complaint of flank pain.  Acute sudden onset flank pain on the right a few hours ago.  Some blood in the urine as well.  Pain is moderate to severe at this time.  No nausea vomiting, no chest pain.  Review of Systems  A thorough review of systems was obtained and all systems are negative except as noted in the HPI and PMH.   Patient's Health History    Past Medical History:  Diagnosis Date   ADHD (attention deficit hyperactivity disorder)    Anemia    Aspiration pneumonia (HCC)    Asthma    exersise induced   CAD (coronary artery disease)    a. s/p CABG 05/2020.   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   LIMA to LAD, SVG to OM, SVG to RCA    Past Surgical History:  Procedure Laterality Date   COLONOSCOPY WITH ESOPHAGOGASTRODUODENOSCOPY (EGD)     CORONARY ARTERY BYPASS GRAFT N/A 06/02/2020   Procedure: CORONARY ARTERY BYPASS GRAFTING (CABG) TIMES THREE USING RIGHT GREATER SAPHENOUS VEIN HARVESTED ENDOSCOPICALLY AND LEFT INTERNAL MAMMARY ARTERY.;  Surgeon: Rexene Alberts, MD;  Location: [place];  Service: Open Heart Surgery;  Laterality: N/A;   CORONARY ARTERY BYPASS GRAFT  06/02/2020   CYSTOSCOPY/RETROGRADE/URETEROSCOPY/STONE EXTRACTION WITH BASKET     ENDARTERECTOMY Right  06/12/2021   Procedure: RIGHT CAROTID ENDARTERECTOMY WITH PATCH ANGIOPLASTY;  Surgeon: Waynetta Sandy, MD;  Location: [place];  Service: Vascular;  Laterality: Right;   ESOPHAGECTOMY     Ivor-Lewis esophagectomy 1995 [facility]   IR THORACENTESIS ASP PLEURAL SPACE W/IMG GUIDE  06/06/2020   LEFT HEART CATH AND CORONARY ANGIOGRAPHY N/A 05/30/2020   Procedure: LEFT HEART CATH AND CORONARY ANGIOGRAPHY;  Surgeon: Martinique, Peter M, MD;  Location: [place] [facility];  Service: Cardiovascular;  Laterality: N/A;   partial esophageal ejectory     SHOULDER ARTHROSCOPY WITH ROTATOR CUFF REPAIR Left 11/09/2021   Procedure: SHOULDER ARTHROSCOPY WITH ROTATOR CUFF REPAIR BICEPS TENODESIS;  Surgeon: [name], MD;  Location: [facility];  Service: Orthopedics;  Laterality: Left;  120   SHOULDER OPEN ROTATOR CUFF REPAIR Left 10/26/2019   Procedure: ROTATOR CUFF REPAIR SHOULDER OPEN LEFT;  Surgeon: [name], MD;  Location: [facility];  Service: Orthopedics;  Laterality: Left;    Family History  Problem Relation Age of Onset   Heart disease Mother    CAD Father    CVA Father     Social History   Socioeconomic History   Marital status: Married    Spouse name: Not on file   Number of children: Not on file   Years of  education: Not on file   Highest education level: Not on file  Occupational History   Not on file  Tobacco Use   Smoking status: Former    Packs/day: 1.00    Years: 25.00    Total pack years: 25.00    Types: Cigarettes    Quit date: 02/19/1993    Years since quitting: 29.0    Passive exposure: Never   Smokeless tobacco: Never  Vaping Use   Vaping Use: Never used  Substance and Sexual Activity   Alcohol use: Yes    Comment: occ   Drug use: Never   Sexual activity: Yes  Other Topics Concern   Not on file  Social History Narrative   Not on file   Social Determinants of Health   Financial Resource Strain: Not on file  Food Insecurity: No Food Insecurity (01/17/2022)    Hunger Vital Sign    Worried About Running Out of Food in the Last Year: Never true    Ran Out of Food in the Last Year: Never true  Transportation Needs: No Transportation Needs (01/17/2022)   PRAPARE - Hydrologist (Medical): No    Lack of Transportation (Non-Medical): No  Physical Activity: Not on file  Stress: Not on file  Social Connections: Not on file  Intimate Partner Violence: Not At Risk (01/17/2022)   Humiliation, Afraid, Rape, and Kick questionnaire    Fear of Current or Ex-Partner: No    Emotionally Abused: No    Physically Abused: No    Sexually Abused: No     Physical Exam   Vitals:   03/05/22 0524 03/05/22 0600  BP: 137/78 122/72  Pulse: 64 69  Resp: 18 16  Temp: 97.6 F (36.4 C)   SpO2: 97% 100%    CONSTITUTIONAL: Well-appearing, NAD NEURO/PSYCH:  Alert and oriented x 3, no focal deficits EYES:  eyes equal and reactive ENT/NECK:  no LAD, no JVD CARDIO: Regular rate, well-perfused, normal S1 and S2 PULM:  CTAB no wheezing or rhonchi GI/GU:  non-distended, non-tender MSK/SPINE:  No gross deformities, no edema SKIN:  no rash, atraumatic   *Additional and/or pertinent findings included in MDM below  Diagnostic and Interventional Summary    EKG Interpretation  Date/Time:    Ventricular Rate:    PR Interval:    QRS Duration:   QT Interval:    QTC Calculation:   R Axis:     Text Interpretation:         Labs Reviewed  CBC - Abnormal; Notable for the following components:      Result Value   WBC 10.7 (*)    RBC 4.17 (*)    Hemoglobin 12.8 (*)    All other components within normal limits  COMPREHENSIVE METABOLIC PANEL - Abnormal; Notable for the following components:   Glucose, Bld 144 (*)    All other components within normal limits  URINALYSIS, ROUTINE W REFLEX MICROSCOPIC    CT RENAL STONE STUDY  Final Result      Medications  HYDROmorphone (DILAUDID) injection 1 mg (1 mg Intravenous Given 03/05/22 0600)   sodium chloride 0.9 % bolus 1,000 mL (1,000 mLs Intravenous New Bag/Given 03/05/22 0601)  ondansetron (ZOFRAN) injection 4 mg (4 mg Intravenous Given 03/05/22 0600)     Procedures  /  Critical Care Procedures  ED Course and Medical Decision Making  Initial Impression and Ddx Suspect kidney stone, other considerations include AAA, renal infarct, diverticulitis, pyelonephritis.  Past medical/surgical history  that increases complexity of ED encounter: Kidney stone, CAD  Interpretation of Diagnostics I personally reviewed the laboratory assessment and my interpretation is as follows: No significant blood count or electrolyte disturbance.  Urinalysis without evidence of infection.  CT revealing an obstructing 12 mm UPJ stone on the right    Patient Reassessment and Ultimate Disposition/Management     Patient's pain is well-controlled, no signs of AKI or UTI.  He is appropriate for follow-up with urology for outpatient management.  Patient management required discussion with the following services or consulting groups:  None  Complexity of Problems Addressed Acute illness or injury that poses threat of life of bodily function  Additional Data Reviewed and Analyzed Further history obtained from: Further history from spouse/family member  Additional Factors Impacting ED Encounter Risk Prescriptions and Use of parenteral controlled substances  Barth Kirks. [place] Small, [place] [email or web address]  Final Clinical Impressions(s) / ED Diagnoses     ICD-10-CM   1. Flank pain  R10.9       ED Discharge Orders     None        Discharge Instructions Discussed with and Provided to Patient:   Discharge Instructions   None      Maudie Flakes, MD 03/05/22 [phone]

## 2022-03-05 NOTE — ED Triage Notes (Signed)
Pt with r sided flank pain since 2300 last night. States he has a kidney stone. Has noticed blood in his urine.

## 2022-03-05 NOTE — Discharge Instructions (Signed)
You were evaluated in the Emergency Department and after careful evaluation, we did not find any emergent condition requiring admission or further testing in the hospital.  Your exam/testing today is overall reassuring.  Symptoms seem to be due to a kidney stone.  Your stone is 12 mm and so you may need help passing the stone.  Call the urology specialist to schedule an appointment.  Recommend Tylenol 1000 mg every 4-6 hours and/or Motrin 600 mg every 4-6 hours for pain.  You can use the oxycodone medication for more significant pain.  Use the Flomax daily to help you pass the stone.  Please return to the Emergency Department if you experience any worsening of your condition.   Thank you for allowing [place] to be a part of your care.

## 2022-03-07 ENCOUNTER — Encounter: Payer: Self-pay | Admitting: Radiology

## 2022-03-11 ENCOUNTER — Telehealth: Payer: Self-pay | Admitting: *Deleted

## 2022-03-11 ENCOUNTER — Encounter: Payer: Self-pay | Admitting: Cardiology

## 2022-03-11 NOTE — Telephone Encounter (Signed)
   Name: ABBAS TABOADA  DOB: 1950-05-10  MRN: [id number]  Primary Cardiologist: Carlyle Dolly, MD   Preoperative team, please contact this patient and set up a phone call appointment for further preoperative risk assessment. Please obtain consent and complete medication review. Thank you for your help.  I confirm that guidance regarding antiplatelet and oral anticoagulation therapy has been completed and, if necessary, noted below.  If needed his aspirin may be held for 5 to 7 days prior to his procedure.  Please resume as soon as hemostasis is achieved.   Deberah Pelton, NP 03/11/2022, 3:17 PM [facility]

## 2022-03-11 NOTE — Telephone Encounter (Signed)
Pt has been scheduled for tele pre op 03/19/22 @ 10:20. Med rec and consent are done.     Patient Consent for Virtual Visit        James Wolf has provided verbal consent on 03/11/2022 for a virtual visit (video or telephone).   CONSENT FOR VIRTUAL VISIT FOR:  James Wolf  By participating in this virtual visit I agree to the following:  I hereby voluntarily request, consent and authorize [facility] and its employed or contracted physicians, physician assistants, nurse practitioners or other licensed health care professionals (the Practitioner), to provide me with telemedicine health care services (the "Services") as deemed necessary by the treating Practitioner. I acknowledge and consent to receive the Services by the Practitioner via telemedicine. I understand that the telemedicine visit will involve communicating with the Practitioner through live audiovisual communication technology and the disclosure of certain medical information by electronic transmission. I acknowledge that I have been given the opportunity to request an in-person assessment or other available alternative prior to the telemedicine visit and am voluntarily participating in the telemedicine visit.  I understand that I have the right to withhold or withdraw my consent to the use of telemedicine in the course of my care at any time, without affecting my right to future care or treatment, and that the Practitioner or I may terminate the telemedicine visit at any time. I understand that I have the right to inspect all information obtained and/or recorded in the course of the telemedicine visit and may receive copies of available information for a reasonable fee.  I understand that some of the potential risks of receiving the Services via telemedicine include:  Delay or interruption in medical evaluation due to technological equipment failure or disruption; Information transmitted may not be sufficient (e.g. poor  resolution of images) to allow for appropriate medical decision making by the Practitioner; and/or  In rare instances, security protocols could fail, causing a breach of personal health information.  Furthermore, I acknowledge that it is my responsibility to provide information about my medical history, conditions and care that is complete and accurate to the best of my ability. I acknowledge that Practitioner's advice, recommendations, and/or decision may be based on factors not within their control, such as incomplete or inaccurate data provided by me or distortions of diagnostic images or specimens that may result from electronic transmissions. I understand that the practice of medicine is not an exact science and that Practitioner makes no warranties or guarantees regarding treatment outcomes. I acknowledge that a copy of this consent can be made available to me via my patient portal ([facility]), or I can request a printed copy by calling the office of [facility].    I understand that my insurance will be billed for this visit.   I have read or had this consent read to me. I understand the contents of this consent, which adequately explains the benefits and risks of the Services being provided via telemedicine.  I have been provided ample opportunity to ask questions regarding this consent and the Services and have had my questions answered to my satisfaction. I give my informed consent for the services to be provided through the use of telemedicine in my medical care

## 2022-03-11 NOTE — Telephone Encounter (Signed)
   Pre-operative Risk Assessment    Patient Name: James Wolf  DOB: April 04, 1950 MRN: [id number]      Request for Surgical Clearance    Procedure:   extracorpeal shockwave lithotripsy  Date of Surgery:  Clearance 03/27/22                                 Surgeon:  Dr. Joya Gaskins Surgeon's Group or Practice Name:  [facility] Phone number:  [phone] Fax number:  [phone] Or [phone]   Type of Clearance Requested:   - [place]   Type of Anesthesia:  Not Indicated   Additional requests/questions:   cardiac clearance note by provider clearly stating patient is cleared, copy of most recent EKG dated and signed by provider, clear instruction for management of anticoagulation medication or aspirin. Please fax to attention of Mirna Mires   03/11/2022, 2:21 PM

## 2022-03-11 NOTE — Telephone Encounter (Signed)
Pt has been scheduled for tele pre op 03/19/22 @ 10:20. Med rec and consent are done.

## 2022-03-12 ENCOUNTER — Other Ambulatory Visit: Payer: Self-pay

## 2022-03-12 MED ORDER — METOPROLOL TARTRATE 100 MG PO TABS: 50.0000 mg | ORAL_TABLET | Freq: Two times a day (BID) | ORAL | 0 refills | Status: DC

## 2022-03-18 NOTE — Progress Notes (Unsigned)
Virtual Visit via Telephone Note   Because of [name]'s co-morbid illnesses, he is at least at moderate risk for complications without adequate follow up.  This format is felt to be most appropriate for this patient at this time.  The patient did not have access to video technology/had technical difficulties with video requiring transitioning to audio format only (telephone).  All issues noted in this document were discussed and addressed.  No physical exam could be performed with this format.  Please refer to the patient's chart for his consent to telehealth for [facility].  Evaluation Performed:  Preoperative cardiovascular risk assessment _____________   Date:  03/18/2022   Patient ID:  James Wolf, DOB 05-Jun-1950, MRN [id number] Patient Location:  Home Provider location:   Office  Primary Care Provider:  Sherrilee Gilles, DO Primary Cardiologist:  Carlyle Dolly, MD  Chief Complaint / Patient Profile   72 y.o. y/o male with a h/o CAD s/p NSTEMI, CABG x 3, HTN, HLD, carotid artery stenosis, right CEA, COPD, syncope who is pending extracorporeal shockwave lithotripsy and presents today for telephonic preoperative cardiovascular risk assessment.  History of Present Illness    James Wolf is a 72 y.o. male who presents via audio/video conferencing for a telehealth visit today.  Pt was last seen in cardiology clinic on 10/03/2021 by by Dr. Carlyle Dolly.  At that time James Wolf was doing well with no new cardiac complaints.  He was granted clearance for surgical procedure.  The patient is now pending procedure as outlined above. Since his last visit, he is doing well with no new cardiac complaints at this time.  He denies chest pain, shortness of breath, lower extremity edema, fatigue, palpitations, melena, hematuria, hemoptysis, diaphoresis, weakness, presyncope, syncope, orthopnea, and PND.    Past Medical History    Past Medical History:   Diagnosis Date   ADHD (attention deficit hyperactivity disorder)    Anemia    Aspiration pneumonia (HCC)    Asthma    exersise induced   CAD (coronary artery disease)    a. s/p CABG 05/2020.   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   LIMA to LAD, SVG to OM, SVG to RCA   Past Surgical History:  Procedure Laterality Date   COLONOSCOPY WITH ESOPHAGOGASTRODUODENOSCOPY (EGD)     CORONARY ARTERY BYPASS GRAFT N/A 06/02/2020   Procedure: CORONARY ARTERY BYPASS GRAFTING (CABG) TIMES THREE USING RIGHT GREATER SAPHENOUS VEIN HARVESTED ENDOSCOPICALLY AND LEFT INTERNAL MAMMARY ARTERY.;  Surgeon: Rexene Alberts, MD;  Location: [place];  Service: Open Heart Surgery;  Laterality: N/A;   CORONARY ARTERY BYPASS GRAFT  06/02/2020   CYSTOSCOPY/RETROGRADE/URETEROSCOPY/STONE EXTRACTION WITH BASKET     ENDARTERECTOMY Right 06/12/2021   Procedure: RIGHT CAROTID ENDARTERECTOMY WITH PATCH ANGIOPLASTY;  Surgeon: Waynetta Sandy, MD;  Location: [place];  Service: Vascular;  Laterality: Right;   ESOPHAGECTOMY     Ivor-Lewis esophagectomy 1995 [facility]   IR THORACENTESIS  ASP PLEURAL SPACE W/IMG GUIDE  06/06/2020   LEFT HEART CATH AND CORONARY ANGIOGRAPHY N/A 05/30/2020   Procedure: LEFT HEART CATH AND CORONARY ANGIOGRAPHY;  Surgeon: Martinique, Peter M, MD;  Location: [place] [facility];  Service: Cardiovascular;  Laterality: N/A;   partial esophageal ejectory     SHOULDER ARTHROSCOPY WITH ROTATOR CUFF REPAIR Left 11/09/2021   Procedure: SHOULDER ARTHROSCOPY WITH ROTATOR CUFF REPAIR BICEPS TENODESIS;  Surgeon:  [name], MD;  [facility];  Service: Orthopedics;  Laterality: Left;  120   SHOULDER OPEN ROTATOR CUFF REPAIR Left 10/26/2019   Procedure: ROTATOR CUFF REPAIR SHOULDER OPEN LEFT;  Surgeon: [name], MD;  [facility];  Service: Orthopedics;  Laterality: Left;    Allergies  Allergies  Allergen Reactions   Zithromax [Azithromycin] Diarrhea    Causes pt's stomach to "tear up" does not want to take medication again.     Home Medications    Prior to Admission medications   Medication Sig Start Date End Date Taking? Authorizing Provider  acetaminophen (TYLENOL) 500 MG tablet Take 1,000 mg by mouth every 6 (six) hours as needed for moderate pain.    [provider]  albuterol (VENTOLIN HFA) 108 (90 Base) MCG/ACT inhaler Inhale 1-2 puffs into the lungs every 6 (six) hours as needed for wheezing or shortness of breath.    [provider]  aspirin EC 81 MG EC tablet Take 1 tablet (81 mg total) by mouth daily. Swallow whole. 06/08/20   Antony Odea, PA-C  B Complex Vitamins (VITAMIN B COMPLEX PO) Take 1 tablet by mouth 2 (two) times daily. 03/10/07   [provider]  cholecalciferol (VITAMIN D3) 25 MCG (1000 UNIT) tablet Take 1,000 Units by mouth 2 (two) times daily.    [provider]  diclofenac sodium (VOLTAREN) 1 % GEL Apply 2 g topically 2 (two) times daily as needed (pain).  09/24/15   [provider]  esomeprazole (NEXIUM) 40 MG capsule Take 40 mg by mouth 2 (two) times daily.  05/26/17   [provider]  ezetimibe (ZETIA) 10 MG tablet TAKE 1 TABLET BY MOUTH EVERY DAY 11/16/21   Arnoldo Lenis, MD  glipiZIDE (GLUCOTROL XL) 2.5 MG 24 hr tablet Take 2.5 mg by mouth daily with breakfast. 03/15/21   [provider]  hydrocortisone 2.5 % cream Apply 1 application. topically 2 (two) times daily as needed for rash. 05/29/21   [provider]  ketoconazole (NIZORAL) 2 % cream Apply 1  application. topically 2 (two) times daily as needed for irritation. 05/29/21   [provider]  lisinopril (ZESTRIL) 2.5 MG tablet Take 2.5 mg by mouth daily. 03/15/21   [provider]  metoprolol tartrate (LOPRESSOR) 100 MG tablet Take 0.5 tablets (50 mg total) by mouth 2 (two) times daily. 03/12/22   Arnoldo Lenis, MD  naproxen (NAPROSYN) 500 MG tablet Take 1 tablet (500 mg total) by mouth 2 (two) times daily. Patient not taking: Reported on 03/11/2022 03/05/22   Maudie Flakes, MD  oxyCODONE (ROXICODONE) 5 MG immediate release tablet Take 1 tablet (5 mg total) by mouth every 4 (four) hours as needed for severe pain. Patient not taking: Reported on 03/11/2022 03/05/22   Maudie Flakes, MD  rosuvastatin (CRESTOR) 40 MG tablet Take one tablet every evening at 6 PM 02/19/22   Arnoldo Lenis, MD  tamsulosin (FLOMAX) 0.4 MG CAPS capsule Take 1 capsule (0.4 mg total) by mouth daily. 03/05/22  Maudie Flakes, MD    Physical Exam    Vital Signs:  James Wolf does not have vital signs available for review today.  Given telephonic nature of communication, physical exam is limited. AAOx3. NAD. Normal affect.  Speech and respirations are unlabored.  Accessory Clinical Findings    None  Assessment & Plan    1.  Preoperative Cardiovascular Risk Assessment:  James Wolf perioperative risk of a major cardiac event is 0.9% according to the Revised Cardiac Risk Index (RCRI).  Therefore, he is at low risk for perioperative complications.   His functional capacity is good at 5.07 METs according to the Duke Activity Status Index (DASI). Recommendations: According to ACC/AHA guidelines, no further cardiovascular testing needed.  The patient may proceed to surgery at acceptable risk.   Antiplatelet and/or Anticoagulation Recommendations: Aspirin can be held for 5-7 days prior to his surgery.  Please resume Aspirin post operatively when it is felt to be safe from a bleeding  standpoint.     The patient was advised that if he develops new symptoms prior to surgery to contact our office to arrange for a follow-up visit, and he verbalized understanding.   A copy of this note will be routed to requesting surgeon.  Time:   Today, I have spent 8 minutes with the patient with telehealth technology discussing medical history, symptoms, and management plan.     Mable Fill, Marissa Nestle, NP  03/18/2022, 1:05 PM

## 2022-03-19 ENCOUNTER — Ambulatory Visit: Payer: Medicare Other | Attending: Cardiovascular Disease

## 2022-03-19 DIAGNOSIS — Z01810 Encounter for preprocedural cardiovascular examination: Secondary | ICD-10-CM

## 2022-05-08 ENCOUNTER — Ambulatory Visit (HOSPITAL_COMMUNITY)
Admission: RE | Admit: 2022-05-08 | Discharge: 2022-05-08 | Disposition: A | Discharge status: Home / Self Care | Payer: Medicare Other | Source: Ambulatory Visit | Attending: Vascular Surgery | Admitting: Vascular Surgery

## 2022-05-08 ENCOUNTER — Other Ambulatory Visit: Payer: Self-pay | Admitting: Cardiology

## 2022-05-08 ENCOUNTER — Ambulatory Visit (INDEPENDENT_AMBULATORY_CARE_PROVIDER_SITE_OTHER): Payer: Medicare Other | Admitting: Physician Assistant

## 2022-05-08 VITALS — BP 121/76 | HR 61 | Temp 98.1°F | Resp 20 | Ht 65.0 in | Wt 167.1 lb

## 2022-05-08 DIAGNOSIS — I779 Disorder of arteries and arterioles, unspecified: Secondary | ICD-10-CM

## 2022-05-08 NOTE — Progress Notes (Signed)
Office Note   History of Present Illness   James Wolf is a 72 y.o. (January 08, 1951) male who presents for surveillance of carotid artery stenosis.  He underwent right carotid endarterectomy with bovine pericardial patch angioplasty on 06/12/2021 by Dr. Randie Heinz.  This was performed for asymptomatic high-grade ICA stenosis.  He returns today for follow-up.  He denies any recent CVA or TIA diagnosis.  He denies any strokelike symptoms such as sudden weakness/numbness, sudden visual changes, dizziness, facial droop, or slurred speech.  He is regaining his health after recently being hospitalized for double pneumonia due to an aspiration event.  He is taking his aspirin and Crestor daily.  Current Outpatient Medications  Medication Sig Dispense Refill   acetaminophen (TYLENOL) 500 MG tablet Take 1,000 mg by mouth every 6 (six) hours as needed for moderate pain.     albuterol (VENTOLIN HFA) 108 (90 Base) MCG/ACT inhaler Inhale 1-2 puffs into the lungs every 6 (six) hours as needed for wheezing or shortness of breath.     aspirin EC 81 MG EC tablet Take 1 tablet (81 mg total) by mouth daily. Swallow whole. 30 tablet 11   B Complex Vitamins (VITAMIN B COMPLEX PO) Take 1 tablet by mouth 2 (two) times daily.     cholecalciferol (VITAMIN D3) 25 MCG (1000 UNIT) tablet Take 1,000 Units by mouth 2 (two) times daily.     diclofenac sodium (VOLTAREN) 1 % GEL Apply 2 g topically 2 (two) times daily as needed (pain).      esomeprazole (NEXIUM) 40 MG capsule Take 40 mg by mouth 2 (two) times daily.      ezetimibe (ZETIA) 10 MG tablet TAKE 1 TABLET BY MOUTH EVERY DAY 30 tablet 4   glipiZIDE (GLUCOTROL XL) 2.5 MG 24 hr tablet Take 2.5 mg by mouth daily with breakfast.     hydrocortisone 2.5 % cream Apply 1 application. topically 2 (two) times daily as needed for rash.     ketoconazole (NIZORAL) 2 % cream Apply 1 application. topically 2 (two) times daily as needed for irritation.     lisinopril (ZESTRIL) 2.5 MG  tablet Take 2.5 mg by mouth daily.     metoprolol tartrate (LOPRESSOR) 100 MG tablet Take 0.5 tablets (50 mg total) by mouth 2 (two) times daily. 90 tablet 0   naproxen (NAPROSYN) 500 MG tablet Take 1 tablet (500 mg total) by mouth 2 (two) times daily. 30 tablet 0   rosuvastatin (CRESTOR) 40 MG tablet Take one tablet every evening at 6 PM 90 tablet 1   tamsulosin (FLOMAX) 0.4 MG CAPS capsule Take 1 capsule (0.4 mg total) by mouth daily. 7 capsule 0   No current facility-administered medications for this visit.    REVIEW OF SYSTEMS (negative unless checked):   Cardiac:  []  Chest pain or chest pressure? []  Shortness of breath upon activity? []  Shortness of breath when lying flat? []  Irregular heart rhythm?  Vascular:  []  Pain in calf, thigh, or hip brought on by walking? []  Pain in feet at night that wakes you up from your sleep? []  Blood clot in your veins? []  Leg swelling?  Pulmonary:  []  Oxygen at home? []  Productive cough? []  Wheezing?  Neurologic:  []  Sudden weakness in arms or legs? []  Sudden numbness in arms or legs? []  Sudden onset of difficult speaking or slurred speech? []  Temporary loss of vision in one eye? []  Problems with dizziness?  Gastrointestinal:  []  Blood in stool? []  Vomited blood?  Genitourinary:  []  Burning when urinating? []  Blood in urine?  Psychiatric:  []  Major depression  Hematologic:  []  Bleeding problems? []  Problems with blood clotting?  Dermatologic:  []  Rashes or ulcers?  Constitutional:  []  Fever or chills?  Ear/Nose/Throat:  []  Change in hearing? []  Nose bleeds? []  Sore throat?  Musculoskeletal:  []  Back pain? []  Joint pain? []  Muscle pain?   Physical Examination   Vitals:   05/08/22 1259 05/08/22 1301  BP: 120/72 121/76  Pulse: 61   Resp: 20   Temp: 98.1 F (36.7 C)   TempSrc: Temporal   SpO2: 98%   Weight: 167 lb 1.6 oz (75.8 kg)   Height: 5\' 5"  (1.651 m)    Body mass index is 27.81 kg/m.  General:   WDWN in NAD; vital signs documented above Gait: Not observed HENT: WNL, normocephalic Pulmonary: normal non-labored breathing  Cardiac: regular Abdomen: soft, NT, no masses Skin: without rashes Vascular Exam/Pulses: palpable radial pulses bilaterally Extremities: without ischemic changes, without gangrene , without cellulitis; without open wounds;  Musculoskeletal: no muscle wasting or atrophy  Neurologic: A&O X 3;  No focal weakness or paresthesias are detected Psychiatric:  The pt has Normal affect.  Non-Invasive Vascular Imaging   B Carotid Duplex (05/08/2022):  R ICA stenosis:   patent endarterectomy without stenosis R VA:  patent and antegrade L ICA stenosis:  1-39% L VA:  patent and antegrade   Medical Decision Making   James Wolf is a 72 y.o. male who presents for surveillance of carotid artery stenosis  Based on the patient's vascular studies, his right carotid endarterectomy site is patent without restenosis. His left ICA stenosis is unchanged at 1-39% He denies any recent CVA or TIA diagnosis. He also denies any strokelike symptoms such as slurred speech, sudden vision changes, weakness/numbness, or slurred speech He has palpable and equal radial pulses bilaterally. I do not detect any carotid bruits on exam He will continue to take his aspirin and statin. He will follow up with our office in 1 yr with carotid duplex   Loel Dubonnet PA-C [facility] Office: [phone]  Clinic MD: Randie Heinz

## 2022-05-17 ENCOUNTER — Other Ambulatory Visit: Payer: Self-pay

## 2022-05-17 DIAGNOSIS — I779 Disorder of arteries and arterioles, unspecified: Secondary | ICD-10-CM

## 2022-05-21 ENCOUNTER — Encounter: Payer: Self-pay | Admitting: *Deleted

## 2022-05-21 ENCOUNTER — Encounter: Payer: Self-pay | Admitting: Cardiology

## 2022-05-21 ENCOUNTER — Ambulatory Visit: Payer: Medicare Other | Attending: Cardiology | Admitting: Cardiology

## 2022-05-21 VITALS — BP 138/70 | HR 62 | Ht 65.0 in | Wt 172.2 lb

## 2022-05-21 DIAGNOSIS — R600 Localized edema: Secondary | ICD-10-CM | POA: Diagnosis present

## 2022-05-21 DIAGNOSIS — I779 Disorder of arteries and arterioles, unspecified: Secondary | ICD-10-CM

## 2022-05-21 DIAGNOSIS — I2510 Atherosclerotic heart disease of native coronary artery without angina pectoris: Secondary | ICD-10-CM | POA: Diagnosis present

## 2022-05-21 DIAGNOSIS — E782 Mixed hyperlipidemia: Secondary | ICD-10-CM | POA: Diagnosis present

## 2022-05-21 MED ORDER — FUROSEMIDE 20 MG PO TABS: 20.0000 mg | ORAL_TABLET | ORAL | 3 refills | Status: AC | PRN

## 2022-05-21 MED ORDER — LISINOPRIL 2.5 MG PO TABS: 2.5000 mg | ORAL_TABLET | Freq: Every day | ORAL | 6 refills | Status: DC

## 2022-05-21 NOTE — Patient Instructions (Signed)
Medication Instructions:   Begin Lasix 20mg  as needed for swelling  Restart Lisinopril 2.5mg  daily  Continue all other medications.     Labwork:  none  Testing/Procedures:  none  Follow-Up:  6 months   Any Other Special Instructions Will Be Listed Below (If Applicable).   If you need a refill on your cardiac medications before your next appointment, please call your pharmacy.

## 2022-05-21 NOTE — Progress Notes (Signed)
Clinical Summary Mr. Cuffee is a 72 y.o.male seen today for follow up of the following medical problems.    1.CAD - admit 05/2020 with NSTEMI - cardiac catheterization by Dr. Swaziland with critical left main and multivessel CAD. He underwent CABG x3 (LIMA-LAD, SVG-OM, SVG-distal RCA)   - occasional chest tightness, about every 2 weeks. Comes on with activity, dull/ache midchest. Mild SOB. Relates in activity.  - compliant with meds    2. HTN - home bp's 130s/70s-80s - off lisinopril since recent admission to [facility] with pneumonia, had some low bp's.      3. Hyperlipidemia - labs followed by pcp 07/2021 TC 98 TG 51 TG 72 LDL 35   4. Carotid stenosis 06/2021 right CEA  - followed by vascular - recent visit 05/2022        5. COPD -admit Jan 2024 with aspiration pneumonitis, COPD exacerbation   6. Prior bariartic surgery      9.Syncope - ER evaluation 07/2021 -from EMS eval low bp's and HRs -episode after iron infusion thought to be reaction  - no recurrent episodes.   10. Pneumonia - recent admission last month with pneumonia - 3 days in hospital - low bp's, lisionpril was stopped at that time.  Past Medical History:  Diagnosis Date   ADHD (attention deficit hyperactivity disorder)    Anemia    Aspiration pneumonia (HCC)    Asthma    exersise induced   CAD (coronary artery disease)    a. s/p CABG 05/2020.   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      Current Outpatient Medications  Medication Sig Dispense Refill   acetaminophen (TYLENOL) 500 MG tablet Take 1,000 mg by mouth every 6 (six) hours as needed for moderate pain.     albuterol (VENTOLIN HFA) 108 (90 Base) MCG/ACT inhaler Inhale 1-2 puffs into the lungs every 6 (six) hours as needed for wheezing or shortness of breath.     aspirin EC 81 MG EC tablet Take 1 tablet (81 mg total) by mouth daily. Swallow whole. 30 tablet 11   B Complex Vitamins (VITAMIN B COMPLEX PO) Take 1 tablet by mouth 2 (two) times daily.     cholecalciferol (VITAMIN D3) 25 MCG (1000 UNIT) tablet Take 1,000 Units by mouth 2 (two) times daily.     diclofenac sodium (VOLTAREN) 1 % GEL Apply 2 g topically 2 (two) times daily as needed (pain).      esomeprazole (NEXIUM) 40 MG capsule Take 40 mg by mouth 2 (two) times daily.      ezetimibe (ZETIA) 10 MG tablet TAKE 1 TABLET BY MOUTH EVERY DAY 30 tablet 4   glipiZIDE (GLUCOTROL XL) 2.5 MG 24 hr  tablet Take 2.5 mg by mouth daily with breakfast.     hydrocortisone 2.5 % cream Apply 1 application. topically 2 (two) times daily as needed for rash.     ketoconazole (NIZORAL) 2 % cream Apply 1 application. topically 2 (two) times daily as needed for irritation.     lisinopril (ZESTRIL) 2.5 MG tablet Take 2.5 mg by mouth daily.     metoprolol tartrate (LOPRESSOR) 100 MG tablet Take 0.5 tablets (50 mg total) by mouth 2 (two) times daily. 90 tablet 0   naproxen (NAPROSYN) 500 MG tablet Take 1 tablet (500 mg total) by mouth 2 (two) times daily. 30 tablet 0   rosuvastatin (CRESTOR) 40 MG tablet Take one tablet every evening at 6 PM 90 tablet 1   tamsulosin (FLOMAX) 0.4 MG CAPS capsule Take 1 capsule (0.4 mg total) by mouth daily. 7 capsule 0   No current facility-administered medications for this visit.     Past Surgical History:  Procedure Laterality Date   COLONOSCOPY WITH  ESOPHAGOGASTRODUODENOSCOPY (EGD)     CORONARY ARTERY BYPASS GRAFT N/A 06/02/2020   Procedure: CORONARY ARTERY BYPASS GRAFTING (CABG) TIMES THREE USING RIGHT GREATER SAPHENOUS VEIN HARVESTED ENDOSCOPICALLY AND LEFT INTERNAL MAMMARY ARTERY.;  Surgeon: Purcell Nails, MD;  Location: [facility] OR;  Service: Open Heart Surgery;  Laterality: N/A;   CORONARY ARTERY BYPASS GRAFT  06/02/2020   CYSTOSCOPY/RETROGRADE/URETEROSCOPY/STONE EXTRACTION WITH BASKET     ENDARTERECTOMY Right 06/12/2021   Procedure: RIGHT CAROTID ENDARTERECTOMY WITH PATCH ANGIOPLASTY;  Surgeon: Maeola Harman, MD;  Location: [facility] OR;  Service: Vascular;  Laterality: Right;   ESOPHAGECTOMY     Ivor-Lewis esophagectomy 1995 [facility]   IR THORACENTESIS ASP PLEURAL SPACE W/IMG GUIDE  06/06/2020   LEFT HEART CATH AND CORONARY ANGIOGRAPHY N/A 05/30/2020   Procedure: LEFT HEART CATH AND CORONARY ANGIOGRAPHY;  Surgeon: Swaziland, Peter M, MD;  Location: [facility] INVASIVE CV LAB;  Service: Cardiovascular;  Laterality: N/A;   partial esophageal ejectory     SHOULDER ARTHROSCOPY WITH ROTATOR CUFF REPAIR Left 11/09/2021   Procedure: SHOULDER ARTHROSCOPY WITH ROTATOR CUFF REPAIR BICEPS TENODESIS;  Surgeon: Yolonda Kida, MD;  Location: [facility] ORS;  Service: Orthopedics;  Laterality: Left;  120   SHOULDER OPEN ROTATOR CUFF REPAIR Left 10/26/2019   Procedure: ROTATOR CUFF REPAIR SHOULDER OPEN LEFT;  Surgeon: Vickki Hearing, MD;  Location: [facility] ORS;  Service: Orthopedics;  Laterality: Left;     Allergies  Allergen Reactions   Zithromax [Azithromycin] Diarrhea    Causes pt's stomach to "tear up" does not want to take medication again.       Family History  Problem Relation Age of Onset   Heart disease Mother    CAD Father    CVA Father      Social History Mr. Claborn reports that he quit smoking about 29 years ago. His smoking use included cigarettes. He has a 25.00 pack-year smoking history. He has never been exposed to tobacco smoke. He  has never used smokeless tobacco. Mr. Troccoli reports current alcohol use.   Review of Systems CONSTITUTIONAL: No weight loss, fever, chills, weakness or fatigue.  HEENT: Eyes: No visual loss, blurred vision, double vision or yellow sclerae.No hearing loss, sneezing, congestion, runny nose or sore throat.  SKIN: No rash or itching.  CARDIOVASCULAR: per hpi RESPIRATORY: No shortness of breath, cough or sputum.  GASTROINTESTINAL: No anorexia, nausea, vomiting or diarrhea. No abdominal pain or blood.  GENITOURINARY: No burning on urination, no polyuria NEUROLOGICAL: No headache, dizziness,  syncope, paralysis, ataxia, numbness or tingling in the extremities. No change in bowel or bladder control.  MUSCULOSKELETAL: no significant joint pains.  LYMPHATICS: No enlarged nodes. No history of splenectomy.  PSYCHIATRIC: No history of depression or anxiety.  ENDOCRINOLOGIC: No reports of sweating, cold or heat intolerance. No polyuria or polydipsia.  Marland Kitchen   Physical Examination Today's Vitals   05/21/22 1002  BP: (!) 156/82  Pulse: 62  SpO2: 98%  Weight: 172 lb 3.2 oz (78.1 kg)  Height: 5\' 5"  (1.651 m)   Body mass index is 28.66 kg/m.  Gen: resting comfortably, no acute distress HEENT: no scleral icterus, pupils equal round and reactive, no palptable cervical adenopathy,  CV: RRR, no m/rg, no jvd Resp: Clear to auscultation bilaterally GI: abdomen is soft, non-tender, non-distended, normal bowel sounds, no hepatosplenomegaly MSK: trace bilateral edema Skin: warm, no rash Neuro:  no focal deficits Psych: appropriate affect   Diagnostic Studies     Assessment and Plan  CAD - no significant symptoms, 2 years out from CABG - continue current meds   2. Carotid stenosis - s/p CEA, followed by vascular - continue medical therapy   3. Hyperlipidemia - at goal, continue current meds   4.LE edema - mild, start lasix 20mg  prn. Discussed limiting sodium intake - if significant  progression or other cardiopulmoanry symptoms would repeat echo - recent steroid course for shoulder surgery, may have had a course as well recent pneumonia  5. HTN - above goal, restart his home lisinopril 2.5mg  daily.       Antoine Poche, M.D.

## 2022-06-16 ENCOUNTER — Other Ambulatory Visit: Payer: Self-pay | Admitting: Cardiology

## 2022-06-17 MED ORDER — METOPROLOL TARTRATE 100 MG PO TABS: 50.0000 mg | ORAL_TABLET | Freq: Two times a day (BID) | ORAL | 1 refills | Status: DC

## 2022-08-04 ENCOUNTER — Other Ambulatory Visit: Payer: Self-pay | Admitting: Cardiology

## 2022-10-31 ENCOUNTER — Encounter: Payer: Self-pay | Admitting: Cardiology

## 2022-11-01 ENCOUNTER — Other Ambulatory Visit: Payer: Self-pay | Admitting: Nurse Practitioner

## 2022-11-01 MED ORDER — EZETIMIBE 10 MG PO TABS: 10.0000 mg | ORAL_TABLET | Freq: Every day | ORAL | 1 refills | Status: DC

## 2022-11-26 ENCOUNTER — Ambulatory Visit: Payer: Medicare Other | Attending: Cardiology | Admitting: Cardiology

## 2022-11-26 ENCOUNTER — Encounter: Payer: Self-pay | Admitting: *Deleted

## 2022-11-26 ENCOUNTER — Encounter: Payer: Self-pay | Admitting: Cardiology

## 2022-11-26 VITALS — BP 114/64 | HR 61 | Ht 65.0 in | Wt 169.2 lb

## 2022-11-26 DIAGNOSIS — I2510 Atherosclerotic heart disease of native coronary artery without angina pectoris: Secondary | ICD-10-CM | POA: Diagnosis not present

## 2022-11-26 DIAGNOSIS — I10 Essential (primary) hypertension: Secondary | ICD-10-CM | POA: Insufficient documentation

## 2022-11-26 DIAGNOSIS — E782 Mixed hyperlipidemia: Secondary | ICD-10-CM | POA: Insufficient documentation

## 2022-11-26 DIAGNOSIS — I6523 Occlusion and stenosis of bilateral carotid arteries: Secondary | ICD-10-CM | POA: Insufficient documentation

## 2022-11-26 NOTE — Progress Notes (Signed)
Clinical Summary James Wolf is a 72 y.o.male seen today for follow up of the following medical problems.    1.CAD - admit 05/2020 with NSTEMI - cardiac catheterization by Dr. Swaziland with critical left main and multivessel CAD. He underwent CABG x3 (LIMA-LAD, SVG-OM, SVG-distal RCA)    - no recent chest pains. - does housework, yardwork. No regular exertional symptoms - compliant with meds      2. HTN  - 120s/60s-70s - compliant with meds    3. Hyperlipidemia - labs followed by pcp 07/2021 TC 98 TG 51 TG 72 LDL 35 - reports more recent labs with pcp   4. Carotid stenosis 06/2021 right CEA  - followed by vascular - recent visit 05/2022         5. COPD -admit Jan 2024 with aspiration pneumonitis, COPD exacerbation   6. Prior bariartic surgery     7.Syncope - ER evaluation 07/2021 -from EMS eval low bp's and HRs -episode after iron infusion thought to be reaction  - no recurrent episodes.     Past Medical History:  Diagnosis Date   ADHD (attention deficit hyperactivity disorder)    Anemia    Aspiration pneumonia (HCC)    Asthma    exersise induced   CAD (coronary artery disease)    a. s/p CABG 05/2020.   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      Current Outpatient Medications  Medication Sig Dispense Refill   acetaminophen (TYLENOL) 500 MG tablet Take 1,000  mg by mouth every 6 (six) hours as needed for moderate pain.     albuterol (VENTOLIN HFA) 108 (90 Base) MCG/ACT inhaler Inhale 1-2 puffs into the lungs every 6 (six) hours as needed for wheezing or shortness of breath.     aspirin EC 81 MG EC tablet Take 1 tablet (81 mg total) by mouth daily. Swallow whole. 30 tablet 11   B Complex Vitamins (VITAMIN B COMPLEX PO) Take 1 tablet by mouth 2 (two) times daily.     cholecalciferol (VITAMIN D3) 25 MCG (1000 UNIT) tablet Take 1,000 Units by mouth 2 (two) times daily.     diclofenac sodium (VOLTAREN) 1 % GEL Apply 2 g topically 2 (two) times daily as needed (pain).      esomeprazole (NEXIUM) 40 MG capsule Take 40 mg by mouth 2 (two) times daily.      ezetimibe (ZETIA) 10 MG tablet Take 1 tablet (10 mg total) by mouth daily. 90 tablet 1   furosemide (LASIX) 20 MG tablet Take 1 tablet (20 mg total) by mouth as needed for edema (swelling). 30 tablet 3   glipiZIDE (GLUCOTROL XL) 2.5 MG 24 hr tablet Take 2.5 mg by mouth daily  with breakfast.     hydrocortisone 2.5 % cream Apply 1 application. topically 2 (two) times daily as needed for rash.     ketoconazole (NIZORAL) 2 % cream Apply 1 application. topically 2 (two) times daily as needed for irritation.     lisinopril (ZESTRIL) 2.5 MG tablet Take 1 tablet (2.5 mg total) by mouth daily. 30 tablet 6   metoprolol tartrate (LOPRESSOR) 100 MG tablet Take 0.5 tablets (50 mg total) by mouth 2 (two) times daily. 90 tablet 1   naproxen (NAPROSYN) 500 MG tablet Take 1 tablet (500 mg total) by mouth 2 (two) times daily. 30 tablet 0   rosuvastatin (CRESTOR) 40 MG tablet Take one tablet every evening at 6 PM 90 tablet 1   tamsulosin (FLOMAX) 0.4 MG CAPS capsule Take 1 capsule (0.4 mg total) by mouth daily. 7 capsule 0   No current facility-administered medications for this visit.     Past Surgical History:  Procedure Laterality Date   COLONOSCOPY WITH ESOPHAGOGASTRODUODENOSCOPY (EGD)     CORONARY ARTERY BYPASS GRAFT  N/A 06/02/2020   Procedure: CORONARY ARTERY BYPASS GRAFTING (CABG) TIMES THREE USING RIGHT GREATER SAPHENOUS VEIN HARVESTED ENDOSCOPICALLY AND LEFT INTERNAL MAMMARY ARTERY.;  Surgeon: Purcell Nails, MD;  Location: [facility] OR;  Service: Open Heart Surgery;  Laterality: N/A;   CORONARY ARTERY BYPASS GRAFT  06/02/2020   CYSTOSCOPY/RETROGRADE/URETEROSCOPY/STONE EXTRACTION WITH BASKET     ENDARTERECTOMY Right 06/12/2021   Procedure: RIGHT CAROTID ENDARTERECTOMY WITH PATCH ANGIOPLASTY;  Surgeon: Maeola Harman, MD;  Location: [facility] OR;  Service: Vascular;  Laterality: Right;   ESOPHAGECTOMY     Ivor-Lewis esophagectomy 1995 [facility]   IR THORACENTESIS ASP PLEURAL SPACE W/IMG GUIDE  06/06/2020   LEFT HEART CATH AND CORONARY ANGIOGRAPHY N/A 05/30/2020   Procedure: LEFT HEART CATH AND CORONARY ANGIOGRAPHY;  Surgeon: Swaziland, Peter M, MD;  Location: [facility] INVASIVE CV LAB;  Service: Cardiovascular;  Laterality: N/A;   partial esophageal ejectory     SHOULDER ARTHROSCOPY WITH ROTATOR CUFF REPAIR Left 11/09/2021   Procedure: SHOULDER ARTHROSCOPY WITH ROTATOR CUFF REPAIR BICEPS TENODESIS;  Surgeon: Yolonda Kida, MD;  Location: [facility] ORS;  Service: Orthopedics;  Laterality: Left;  120   SHOULDER OPEN ROTATOR CUFF REPAIR Left 10/26/2019   Procedure: ROTATOR CUFF REPAIR SHOULDER OPEN LEFT;  Surgeon: Vickki Hearing, MD;  Location: [facility] ORS;  Service: Orthopedics;  Laterality: Left;     Allergies  Allergen Reactions   Zithromax [Azithromycin] Diarrhea    Causes pt's stomach to "tear up" does not want to take medication again.       Family History  Problem Relation Age of Onset   Heart disease Mother    CAD Father    CVA Father      Social History Mr. Enwright reports that he quit smoking about 29 years ago. His smoking use included cigarettes. He started smoking about 54 years ago. He has a 25 pack-year smoking history. He has never been exposed to tobacco smoke. He has never used smokeless  tobacco. Mr. Egloff reports current alcohol use.   Review of Systems CONSTITUTIONAL: No weight loss, fever, chills, weakness or fatigue.  HEENT: Eyes: No visual loss, blurred vision, double vision or yellow sclerae.No hearing loss, sneezing, congestion, runny nose or sore throat.  SKIN: No rash or itching.  CARDIOVASCULAR: per hpi RESPIRATORY: No shortness of breath, cough or sputum.  GASTROINTESTINAL: No anorexia, nausea, vomiting or diarrhea. No abdominal pain or blood.  GENITOURINARY: No burning on urination, no polyuria  NEUROLOGICAL: No headache, dizziness, syncope, paralysis, ataxia, numbness or tingling in the extremities. No change in bowel or bladder control.  MUSCULOSKELETAL: No muscle, back pain, joint pain or stiffness.  LYMPHATICS: No enlarged nodes. No history of splenectomy.  PSYCHIATRIC: No history of depression or anxiety.  ENDOCRINOLOGIC: No reports of sweating, cold or heat intolerance. No polyuria or polydipsia.  Marland Kitchen   Physical Examination Today's Vitals   11/26/22 1140  BP: 114/64  Pulse: 61  SpO2: 97%  Weight: 169 lb 3.2 oz (76.7 kg)  Height: 5\' 5"  (1.651 m)   Body mass index is 28.16 kg/m.  Gen: resting comfortably, no acute distress HEENT: no scleral icterus, pupils equal round and reactive, no palptable cervical adenopathy,  CV: RRR, no m/rg, no jvd Resp: Clear to auscultation bilaterally GI: abdomen is soft, non-tender, non-distended, normal bowel sounds, no hepatosplenomegaly MSK: extremities are warm, no edema.  Skin: warm, no rash Neuro:  no focal deficits Psych: appropriate affect   Diagnostic Studies     Assessment and Plan   CAD - doing well without symptoms, continue current meds   2. Carotid stenosis - s/p CEA, followed by vascular -he will continue current medical therapy  3. Hyperlipidemia - request pcp labs, continue current meds   4.HTN - at goal, continue current meds    Antoine Poche, M.D.

## 2022-11-26 NOTE — Patient Instructions (Signed)
Medication Instructions:  Continue all current medications.   Labwork: none  Testing/Procedures: none  Follow-Up: 6 months   Any Other Special Instructions Will Be Listed Below (If Applicable).   If you need a refill on your cardiac medications before your next appointment, please call your pharmacy.  

## 2022-12-18 ENCOUNTER — Other Ambulatory Visit: Payer: Self-pay | Admitting: Cardiology

## 2023-01-18 ENCOUNTER — Emergency Department (HOSPITAL_COMMUNITY)
Admission: EM | Admit: 2023-01-18 | Discharge: 2023-01-18 | Disposition: A | Discharge status: Home / Self Care | Payer: Medicare Other | Attending: Emergency Medicine | Admitting: Emergency Medicine

## 2023-01-18 ENCOUNTER — Emergency Department (HOSPITAL_COMMUNITY): Payer: Medicare Other

## 2023-01-18 ENCOUNTER — Encounter (HOSPITAL_COMMUNITY): Payer: Self-pay

## 2023-01-18 ENCOUNTER — Other Ambulatory Visit: Payer: Self-pay

## 2023-01-18 DIAGNOSIS — S4991XA Unspecified injury of right shoulder and upper arm, initial encounter: Secondary | ICD-10-CM | POA: Diagnosis present

## 2023-01-18 DIAGNOSIS — W000XXA Fall on same level due to ice and snow, initial encounter: Secondary | ICD-10-CM | POA: Diagnosis not present

## 2023-01-18 DIAGNOSIS — S42017A Nondisplaced fracture of sternal end of right clavicle, initial encounter for closed fracture: Secondary | ICD-10-CM | POA: Insufficient documentation

## 2023-01-18 DIAGNOSIS — Z7982 Long term (current) use of aspirin: Secondary | ICD-10-CM | POA: Diagnosis not present

## 2023-01-18 MED ORDER — NAPROXEN 250 MG PO TABS
500.0000 mg | ORAL_TABLET | Freq: Once | ORAL | Status: AC
  Administered 2023-01-18: 500 mg via ORAL
  Filled 2023-01-18: qty 2

## 2023-01-18 MED ORDER — OXYCODONE-ACETAMINOPHEN 5-325 MG PO TABS: 1.0000 | ORAL_TABLET | Freq: Three times a day (TID) | ORAL | 0 refills | Status: DC | PRN

## 2023-01-18 MED ORDER — NAPROXEN 500 MG PO TABS: 500.0000 mg | ORAL_TABLET | Freq: Two times a day (BID) | ORAL | 0 refills | Status: DC

## 2023-01-18 NOTE — ED Notes (Signed)
 EDP at bedside

## 2023-01-18 NOTE — ED Provider Notes (Signed)
 [facility] [place] EMERGENCY DEPARTMENT AT [facility] Provider Note   CSN: [id number] Arrival date & time: 01/18/23  1540     History  Chief Complaint  Patient presents with   [place]    James Wolf is a 73 y.o. male.   Fall   This patient is a 73 year old male, he has a history of prior coronary bypass grafting many years ago, he also has a history of a fall which occurred within the last hour where he was slipping on some ice and landed on his right elbow, this did not actually injure his arm but in fact caused acute onset of pain at the sternoclavicular joint on the right and he has subsequently developed a small area of swelling at that same location.  He denies any pain or numbness in the arm or his fingers or hand, he denies any head injury or neck pain and was able to get up off the ground by himself.  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    Home Medications Prior to Admission medications   Medication Sig Start Date End Date Taking? Authorizing Provider  naproxen  (NAPROSYN ) 500 MG tablet Take 1 tablet (500 mg total) by mouth 2 (two) times daily with a meal. 01/18/23  Yes Cleotilde Rogue, MD  oxyCODONE -acetaminophen  (PERCOCET/ROXICET) 5-325 MG tablet Take 1 tablet by mouth every 8 (eight) hours as needed for severe pain (pain score 7-10). 01/18/23  Yes Cleotilde Rogue, MD  acetaminophen  (TYLENOL ) 500 MG tablet Take 1,000 mg by mouth every 6 (six) hours as needed for moderate pain.    [provider]  albuterol  (VENTOLIN  HFA) 108 (90 Base) MCG/ACT inhaler Inhale 1-2 puffs into the lungs every 6 (six) hours as needed for wheezing or shortness of breath.    [provider]  aspirin  EC 81 MG EC tablet Take 1 tablet (81 mg total) by mouth daily. Swallow whole. 06/08/20    Roddenberry, Myron G, PA-C  B Complex Vitamins (VITAMIN B COMPLEX  PO) Take 1 tablet by mouth 2 (two) times daily. 03/10/07   [provider]  cholecalciferol  (VITAMIN D3) 25 MCG (1000 UNIT) tablet Take 1,000 Units by mouth 2 (two) times daily.    [provider]  diclofenac  sodium (VOLTAREN ) 1 % GEL Apply 2 g topically 2 (two) times daily as needed (pain).  09/24/15   [provider]  esomeprazole  (NEXIUM ) 40 MG capsule Take 40 mg by mouth 2 (two) times daily.  05/26/17   [provider]  ezetimibe  (ZETIA ) 10 MG tablet Take 1 tablet (10 mg total) by mouth daily. 11/01/22   Alvan Dorn FALCON, MD  furosemide  (LASIX ) 20 MG tablet Take 1 tablet (20 mg total) by mouth as needed for edema (swelling). 05/21/22   Alvan Dorn FALCON, MD  glipiZIDE  (GLUCOTROL  XL) 2.5 MG 24 hr tablet Take 2.5 mg by mouth daily with breakfast. 03/15/21   [provider]  hydrocortisone 2.5 % cream Apply 1 application. topically 2 (two) times daily as needed for rash. 05/29/21   [provider]  ketoconazole (NIZORAL) 2 % cream Apply 1 application. topically 2 (two) times daily as needed for irritation. 05/29/21   [provider]  lisinopril  (ZESTRIL ) 2.5 MG tablet Take 1 tablet (2.5 mg total) by mouth daily. 05/21/22  Alvan Dorn FALCON, MD  metoprolol  tartrate (LOPRESSOR ) 100 MG tablet Take 1/2 (one-half) tablet by mouth twice daily 12/18/22   Alvan Dorn FALCON, MD  rosuvastatin  (CRESTOR ) 40 MG tablet Take one tablet every evening at 6 PM 02/19/22   Branch, Dorn FALCON, MD  tamsulosin  (FLOMAX ) 0.4 MG CAPS capsule Take 1 capsule (0.4 mg total) by mouth daily. 03/05/22   Theadore Ozell HERO, MD      Allergies    Zithromax [azithromycin]    Review of Systems   Review of Systems  All other systems reviewed and are negative.   Physical Exam Updated Vital Signs BP (!) 115/57 (BP Location: Left Arm)   Pulse (!) 57   Temp 97.7 F (36.5 C) (Oral)   Resp 18   Ht 1.651 m (5'  5)   Wt 77.1 kg   SpO2 100%   BMI 28.29 kg/m  Physical Exam Vitals and nursing note reviewed.  Constitutional:      General: He is not in acute distress.    Appearance: He is well-developed.  HENT:     Head: Normocephalic and atraumatic.     Mouth/Throat:     Pharynx: No oropharyngeal exudate.  Eyes:     General: No scleral icterus.       Right eye: No discharge.        Left eye: No discharge.     Conjunctiva/sclera: Conjunctivae normal.     Pupils: Pupils are equal, round, and reactive to light.  Neck:     Thyroid: No thyromegaly.     Vascular: No JVD.  Cardiovascular:     Rate and Rhythm: Normal rate and regular rhythm.     Heart sounds: Normal heart sounds. No murmur heard.    No friction rub. No gallop.  Pulmonary:     Effort: Pulmonary effort is normal. No respiratory distress.     Breath sounds: Normal breath sounds. No wheezing or rales.  Abdominal:     General: Bowel sounds are normal. There is no distension.     Palpations: Abdomen is soft. There is no mass.     Tenderness: There is no abdominal tenderness.  Musculoskeletal:        General: Tenderness present. Normal range of motion.     Cervical back: Normal range of motion and neck supple.     Right lower leg: No edema.     Left lower leg: No edema.     Comments: There is tenderness and swelling at the sternoclavicular joint on the right, normal arm exam  Lymphadenopathy:     Cervical: No cervical adenopathy.  Skin:    General: Skin is warm and dry.     Findings: No erythema or rash.  Neurological:     Mental Status: He is alert.     Coordination: Coordination normal.  Psychiatric:        Behavior: Behavior normal.     ED Results / Procedures / Treatments   Labs (all labs ordered are listed, but only abnormal results are displayed) Labs Reviewed - No data to display  EKG None  Radiology No results found.  Procedures Procedures    Medications Ordered in ED Medications  naproxen   (NAPROSYN ) tablet 500 mg (500 mg Oral Given 01/18/23 1602)    ED Course/ Medical Decision Making/ A&P  Medical Decision Making Amount and/or Complexity of Data Reviewed Radiology: ordered.  Risk Prescription drug management.   The patient is totally normal upper extremity exam of the right including the shoulder elbow wrist and hand, the tenderness is purely over the medial clavicle at the sternoclavicular joint.  Will obtain imaging to rule out fractures or dislocations, he has had a prior coronary bypass with a medium sternotomy many years ago but there is no tenderness down the sternum, no changes in breath sounds, normal pulses at the right radial artery.  I do not see any neurologic symptoms I doubt that this is a great vessel injury given the mechanism of the injury I suspect it is bony and he appears hemodynamically stable.  He is agreeable to x-rays and formal immobilization with a sling  Imaging: I personally viewed and interpret the x-ray of the right clavicle which shows a minimally displaced medial clavicular fracture at the sternoclavicular and.  I do not see any other fractures or dislocations, I agree with radiologist interpretation  Medication management: Naprosyn  given, Percocet for home  Immobilization: Patient placed in a sling  I have discussed with the patient at the bedside the results, and the meaning of these results.  They have had opportunity to ask questions,  expressed their understanding to the need for follow-up with primary care physician        Final Clinical Impression(s) / ED Diagnoses Final diagnoses:  Closed nondisplaced fracture of sternal end of right clavicle, initial encounter    Rx / DC Orders ED Discharge Orders          Ordered    naproxen  (NAPROSYN ) 500 MG tablet  2 times daily with meals        01/18/23 1642    oxyCODONE -acetaminophen  (PERCOCET/ROXICET) 5-325 MG tablet  Every 8 hours PRN         01/18/23 1642              Cleotilde Rogue, MD 01/18/23 1643

## 2023-01-18 NOTE — Discharge Instructions (Addendum)
 Your x-ray shows that your collarbone is broken, this is something that should get better by itself over the next couple of months, you will need to see your family doctor for ongoing guidance regarding this but usually does just keeping her arm in a sling, take your arm out of the sling several times a day to do some range of motion exercises with your shoulder.  Take Naprosyn  twice a day for pain, for severe pain oxycodone  1 tablet every 8 hours as needed.  This may cause constipation and nausea.  RICE therapy:  Apply ice wrapped in a towel intermittently keeping it on the skin no longer than 10 minutes a couple of times an hour  Elevate the affected extremity to help reduce blood flow and prevent swelling  Use an anti-inflammatory if you are not allergic to it such as ibuprofen  or Naprosyn  to help with pain and swelling  Use a compressive device whether it is an Ace wrap or other  immobilizer to help minimize movement and compress the swelling.  Thank you for allowing us  to treat you in the emergency department today.  After reviewing your examination and potential testing that was done it appears that you are safe to go home.  I would like for you to follow-up with your doctor within the next several days, have them obtain your records and follow-up with them to review all potential tests and results from your visit.  If you should develop severe or worsening symptoms return to the emergency department immediately

## 2023-01-18 NOTE — ED Triage Notes (Addendum)
 Pt presents to ED from home C/O slip and fall on ice resulting in pain and swelling of R collarbone. Denies hitting head, blood thinner use. Arrives with sling in place from FD.

## 2023-02-24 ENCOUNTER — Other Ambulatory Visit: Payer: Self-pay | Admitting: Cardiology

## 2023-05-02 ENCOUNTER — Telehealth: Payer: Self-pay

## 2023-05-02 NOTE — Telephone Encounter (Signed)
   Name: James Wolf  DOB: [birth year]  MRN: [id number]  Primary Cardiologist: Armida Lander, MD   Preoperative team, please contact this patient and set up a phone call appointment for further preoperative risk assessment. Please obtain consent and complete medication review. Thank you for your help.  I confirm that guidance regarding antiplatelet and oral anticoagulation therapy has been completed and, if necessary, noted below.  Regarding ASA therapy, we recommend continuation of ASA throughout the perioperative period. However, if the surgeon feels that cessation of ASA is required in the perioperative period, it may be stopped 5-7 days prior to surgery with a plan to resume it as soon as felt to be feasible from a surgical standpoint in the post-operative period.     I also confirmed the patient resides in the state of [place] . As per [facility] Medical Board telemedicine laws, the patient must reside in the state in which the provider is licensed.   Carie Charity, NP 05/02/2023, 10:48 AM [facility]

## 2023-05-02 NOTE — Telephone Encounter (Signed)
Left message for the patient to contact the office. 

## 2023-05-02 NOTE — Telephone Encounter (Signed)
   Pre-operative Risk Assessment    Patient Name: James Wolf  DOB: July 21, 1950 MRN: [id number]   Date of last office visit: 11/26/22 Date of next office visit: 06/25/23   Request for Surgical Clearance    Procedure:   Rt shoulder scope RCR, SAD, DCR  Date of Surgery:  Clearance TBD                                Surgeon:  Dr. Carter Clare Surgeon's Group or Practice Name:  [facility] Phone number:  [phone] Fax number:  [phone]   Type of Clearance Requested:   - Medical  - Pharmacy:  Hold Aspirin      Type of Anesthesia:  Not Indicated   Additional requests/questions:    [name]   05/02/2023, 10:40 AM

## 2023-05-06 NOTE — Telephone Encounter (Signed)
  Patient Consent for Virtual Visit        [name] has provided verbal consent on 05/06/2023 for a virtual visit (video or telephone).   CONSENT FOR VIRTUAL VISIT FOR:  James Wolf  By participating in this virtual visit I agree to the following:  I hereby voluntarily request, consent and authorize [facility] and its employed or contracted physicians, physician assistants, nurse practitioners or other licensed health care professionals (the Practitioner), to provide me with telemedicine health care services (the "Services") as deemed necessary by the treating Practitioner. I acknowledge and consent to receive the Services by the Practitioner via telemedicine. I understand that the telemedicine visit will involve communicating with the Practitioner through live audiovisual communication technology and the disclosure of certain medical information by electronic transmission. I acknowledge that I have been given the opportunity to request an in-person assessment or other available alternative prior to the telemedicine visit and am voluntarily participating in the telemedicine visit.  I understand that I have the right to withhold or withdraw my consent to the use of telemedicine in the course of my care at any time, without affecting my right to future care or treatment, and that the Practitioner or I may terminate the telemedicine visit at any time. I understand that I have the right to inspect all information obtained and/or recorded in the course of the telemedicine visit and may receive copies of available information for a reasonable fee.  I understand that some of the potential risks of receiving the Services via telemedicine include:  Delay or interruption in medical evaluation due to technological equipment failure or disruption; Information transmitted may not be sufficient (e.g. poor resolution of images) to allow for appropriate medical decision making by the  Practitioner; and/or  In rare instances, security protocols could fail, causing a breach of personal health information.  Furthermore, I acknowledge that it is my responsibility to provide information about my medical history, conditions and care that is complete and accurate to the best of my ability. I acknowledge that Practitioner's advice, recommendations, and/or decision may be based on factors not within their control, such as incomplete or inaccurate data provided by me or distortions of diagnostic images or specimens that may result from electronic transmissions. I understand that the practice of medicine is not an exact science and that Practitioner makes no warranties or guarantees regarding treatment outcomes. I acknowledge that a copy of this consent can be made available to me via my patient portal [facility] MyChart), or I can request a printed copy by calling the office of [facility].    I understand that my insurance will be billed for this visit.   I have read or had this consent read to me. I understand the contents of this consent, which adequately explains the benefits and risks of the Services being provided via telemedicine.  I have been provided ample opportunity to ask questions regarding this consent and the Services and have had my questions answered to my satisfaction. I give my informed consent for the services to be provided through the use of telemedicine in my medical care

## 2023-05-12 ENCOUNTER — Encounter: Payer: Self-pay | Admitting: Nurse Practitioner

## 2023-05-12 ENCOUNTER — Ambulatory Visit: Payer: PRIVATE HEALTH INSURANCE | Attending: Cardiovascular Disease | Admitting: Nurse Practitioner

## 2023-05-12 DIAGNOSIS — Z01810 Encounter for preprocedural cardiovascular examination: Secondary | ICD-10-CM | POA: Insufficient documentation

## 2023-05-12 NOTE — Progress Notes (Signed)
 Virtual Visit via Telephone Note   Because of SENNA STOTZ co-morbid illnesses, he is at least at moderate risk for complications without adequate follow up.  This format is felt to be most appropriate for this patient at this time.  Due to technical limitations with video connection (technology), today's appointment will be conducted as an audio only telehealth visit, and James Wolf verbally agreed to proceed in this manner.   All issues noted in this document were discussed and addressed.  No physical exam could be performed with this format.  Evaluation Performed:  Preoperative cardiovascular risk assessment _____________   Date:  05/12/2023   Patient ID:  James Wolf, DOB May 03, 1950, MRN [id number] Patient Location:  Home Provider location:   Office  Primary Care Provider:  Ava Lei, DO Primary Cardiologist:  Armida Lander, MD  Chief Complaint / Patient Profile   73 y.o. y/o male with a h/o CAD S/p CABG x 3 in 2022, HTN, HLD, right CEA, COPD, syncope following iron infusion thought to be reaction to infusion who is pending right shoulder scope RCR, SAD, DCR with Dr. Hiram Lukes on date TBD and presents today for telephonic preoperative cardiovascular risk assessment.  History of Present Illness    James Wolf is a 73 y.o. male who presents via audio/video conferencing for a telehealth visit today.  Pt was last seen in cardiology clinic on 11/26/2022 by Dr. Amanda Jungling.  At that time James Wolf was doing well.  The patient is now pending procedure as outlined above. Since his last visit, he  denies chest pain, shortness of breath, lower extremity edema, fatigue, palpitations, melena, hematuria, hemoptysis, diaphoresis, weakness, presyncope, syncope, orthopnea, and PND. He is limited by shoulder pain following a fall, but is able to achieve > 4 METS activity without concerning cardiac symptoms.   Past Medical History    Past Medical History:  Diagnosis Date    ADHD (attention deficit hyperactivity disorder)    Anemia    Aspiration pneumonia (HCC)    Asthma    exersise induced   CAD (coronary artery disease)    a. s/p CABG 05/2020.   Cancer [facility])    esophageal cancer 1995   Carotid artery disease (HCC)    Chronic kidney disease, stage 3a (HCC)    COPD (chronic obstructive pulmonary disease) (HCC)    GERD (gastroesophageal reflux disease)    History of hiatal hernia    History of kidney stones    Hyperlipidemia    Hypertension    Joint pain    Lung nodule    Myocardial infarction (HCC)    Osteomyelitis (HCC)    Osteoporosis    Pre-diabetes    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   LIMA to LAD, SVG to OM, SVG to RCA   Past Surgical History:  Procedure Laterality Date   COLONOSCOPY WITH ESOPHAGOGASTRODUODENOSCOPY (EGD)     CORONARY ARTERY BYPASS GRAFT N/A 06/02/2020   Procedure: CORONARY ARTERY BYPASS GRAFTING (CABG) TIMES THREE USING RIGHT GREATER SAPHENOUS VEIN HARVESTED ENDOSCOPICALLY AND LEFT INTERNAL MAMMARY ARTERY.;  Surgeon: Gardenia Jump, MD;  Location: [facility] OR;  Service: Open Heart Surgery;  Laterality: N/A;   CORONARY ARTERY BYPASS GRAFT  06/02/2020   CYSTOSCOPY/RETROGRADE/URETEROSCOPY/STONE EXTRACTION WITH BASKET     ENDARTERECTOMY Right 06/12/2021   Procedure: RIGHT CAROTID ENDARTERECTOMY WITH PATCH ANGIOPLASTY;  Surgeon: Adine Hoof, MD;  Location: [facility] OR;  Service: Vascular;  Laterality: Right;   ESOPHAGECTOMY     Ivor-Lewis esophagectomy 1995  [facility]   IR THORACENTESIS ASP PLEURAL SPACE W/IMG GUIDE  06/06/2020   LEFT HEART CATH AND CORONARY ANGIOGRAPHY N/A 05/30/2020   Procedure: LEFT HEART CATH AND CORONARY ANGIOGRAPHY;  Surgeon: Swaziland, Peter M, MD;  [facility] INV[facility] CV LAB;  Service: Cardiovascular;  Laterality: N/A;   partial esophageal ejectory     SHOULDER ARTHROSCOPY WITH ROTATOR CUFF REPAIR Left 11/09/2021   Procedure: SHOULDER ARTHROSCOPY WITH ROTATOR CUFF REPAIR BICEPS TENODESIS;  Surgeon: Janeth Medicus, MD;  Location: [facility] ORS;  Service: Orthopedics;  Laterality: Left;  120   SHOULDER OPEN ROTATOR CUFF REPAIR Left 10/26/2019   Procedure: ROTATOR CUFF REPAIR SHOULDER OPEN LEFT;  Surgeon: Darrin Emerald, MD;  Location: [facility] ORS;  Service: Orthopedics;  Laterality: Left;    Allergies  Allergies  Allergen Reactions   Zithromax [Azithromycin] Diarrhea    Causes pt's stomach to "tear up" does not want to take medication again.     Home Medications    Prior to Admission medications   Medication Sig Start Date End Date Taking? Authorizing Provider  acetaminophen  (TYLENOL ) 500 MG tablet Take 1,000 mg by mouth every 6 (six) hours as needed for moderate pain.    [provider]  albuterol  (VENTOLIN  HFA) 108 (90 Base) MCG/ACT inhaler Inhale 1-2 puffs into the lungs every 6 (six) hours as needed for wheezing or shortness of breath. Patient not taking: Reported on 05/06/2023    [provider]  aspirin  EC 81 MG EC tablet Take 1 tablet (81 mg total) by mouth daily. Swallow whole. 06/08/20   Roddenberry, Myron G, PA-C  B Complex Vitamins (VITAMIN B COMPLEX  PO) Take 1 tablet by mouth 2 (two) times daily. 03/10/07   [provider]  cholecalciferol  (VITAMIN D3) 25 MCG (1000 UNIT) tablet Take 1,000 Units by mouth 2 (two) times daily.    [provider]  diclofenac  sodium (VOLTAREN ) 1 % GEL Apply 2 g topically 2 (two) times daily as needed (pain).  09/24/15   [provider]  esomeprazole  (NEXIUM ) 40 MG capsule Take 40 mg by mouth 2 (two) times daily.  05/26/17   [provider]  ezetimibe  (ZETIA ) 10 MG tablet Take 1 tablet (10 mg total) by mouth daily. 11/01/22   Laurann Pollock, MD  furosemide  (LASIX ) 20 MG tablet Take 1 tablet (20 mg total) by mouth as needed for edema (swelling). Patient not taking: Reported on 05/06/2023 05/21/22   Laurann Pollock, MD  glipiZIDE  (GLUCOTROL  XL) 2.5 MG 24 hr tablet Take 2.5 mg by mouth daily with breakfast.  03/15/21   [provider]  hydrocortisone 2.5 % cream Apply 1 application. topically 2 (two) times daily as needed for rash. 05/29/21   [provider]  ketoconazole (NIZORAL) 2 % cream Apply 1 application. topically 2 (two) times daily as needed for irritation. 05/29/21   [provider]  lisinopril  (ZESTRIL ) 2.5 MG tablet Take 1 tablet by mouth once daily 02/25/23   Laurann Pollock, MD  metoprolol  tartrate (LOPRESSOR ) 100 MG tablet Take 1/2 (one-half) tablet by mouth twice daily 12/18/22   Laurann Pollock, MD  naproxen  (NAPROSYN ) 500 MG tablet Take 1 tablet (500 mg total) by mouth 2 (two) times daily with a meal. 01/18/23   Early Glisson, MD  oxyCODONE -acetaminophen  (PERCOCET/ROXICET) 5-325 MG tablet Take 1 tablet by mouth every 8 (eight) hours as needed for severe pain (pain score 7-10). 01/18/23   Early Glisson, MD  rosuvastatin  (CRESTOR ) 40 MG tablet TAKE 1 TABLET  BY MOUTH ONCE DAILY AT  6PM 02/25/23   Laurann Pollock, MD  tamsulosin  (FLOMAX ) 0.4 MG CAPS capsule Take 1 capsule (0.4 mg total) by mouth daily. 03/05/22   Edson Graces, MD    Physical Exam    Vital Signs:  James Wolf does not have vital signs available for review today.  Given telephonic nature of communication, physical exam is limited. AAOx3. NAD. Normal affect.  Speech and respirations are unlabored.  Accessory Clinical Findings    None  Assessment & Plan    1.  Preoperative Cardiovascular Risk Assessment: According to the Revised Cardiac Risk Index (RCRI), his Perioperative Risk of Major Cardiac Event is (%): 0.9. His Functional Capacity in METs is: 6.61 according to the Duke Activity Status Index (DASI). The patient is doing well from a cardiac perspective. Therefore, based on ACC/AHA guidelines, the patient would be at acceptable risk for the planned procedure without further cardiovascular testing.   The patient was advised that if he develops new symptoms prior to surgery to  contact our office to arrange for a follow-up visit, and he verbalized understanding.  Regarding ASA therapy, we recommend continuation of ASA throughout the perioperative period. However, if the surgeon feels that cessation of ASA is required in the perioperative period, it may be stopped 5-7 days prior to surgery with a plan to resume it as soon as felt to be feasible from a surgical standpoint in the post-operative period.    A copy of this note will be routed to requesting surgeon.  Time:   Today, I have spent 10 minutes with the patient with telehealth technology discussing medical history, symptoms, and management plan.    Gerldine Koch, NP-C  05/12/2023, 3:23 PM [address], [address] [place], [place] [address] Office [phone] Fax [phone]

## 2023-05-17 ENCOUNTER — Other Ambulatory Visit: Payer: Self-pay | Admitting: Cardiology

## 2023-06-09 ENCOUNTER — Other Ambulatory Visit: Payer: Self-pay

## 2023-06-09 DIAGNOSIS — I779 Disorder of arteries and arterioles, unspecified: Secondary | ICD-10-CM

## 2023-06-11 NOTE — Patient Instructions (Signed)
 SURGICAL WAITING ROOM VISITATION  Patients having surgery or a procedure may have no more than 2 support people in the waiting area - these visitors may rotate.    Children under the age of 34 must have an adult with them who is not the patient.  Visitors with respiratory illnesses are discouraged from visiting and should remain at home.  If the patient needs to stay at the hospital during part of their recovery, the visitor guidelines for inpatient rooms apply. Pre-op nurse will coordinate an appropriate time for 1 support person to accompany patient in pre-op.  This support person may not rotate.    Please refer to the [facility] website for the visitor guidelines for Inpatients (after your surgery is over and you are in a regular room).       Your procedure is scheduled on: 06-20-23    Report to [facility] Main Entrance    Report to admitting at      0515  AM   Call this number if you have problems the morning of surgery [phone]   Do not eat food :After Midnight.   After Midnight you may have the following liquids until _0415_____ AM/  DAY OF SURGERY  then nothing bymouth  Water Non-Citrus Juices (without pulp, NO RED-Apple, White grape, White cranberry) Black Coffee (NO MILK/CREAM OR CREAMERS, sugar ok)  Clear Tea (NO MILK/CREAM OR CREAMERS, sugar ok) regular and decaf                             Plain Jell-O (NO RED)                                           Fruit ices (not with fruit pulp, NO RED)                                     Popsicles (NO RED)                                                               Sports drinks like Gatorade (NO RED)                    The day of surgery:  Drink ONE (1) Pre-Surgery G2   BY   0415 AM the morning of surgery. Drink in one sitting. Do not sip.  This drink was given to you during your hospital  pre-op appointment visit. Nothing else to drink after completing the  Pre-Surgery  G2.          If you have  questions, please contact your surgeon's office.   FOLLOW  ANY ADDITIONAL PRE OP INSTRUCTIONS YOU RECEIVED FROM YOUR SURGEON'S OFFICE!!!     Oral Hygiene is also important to reduce your risk of infection.                                    Remember - BRUSH YOUR TEETH THE MORNING OF SURGERY  WITH YOUR REGULAR TOOTHPASTE  DENTURES WILL BE REMOVED PRIOR TO SURGERY PLEASE DO NOT APPLY "Poly grip" OR ADHESIVES!!!   Do NOT smoke after Midnight   Stop all vitamins and herbal supplements 7 days before surgery.   Take these medicines the morning of surgery with A SIP OF WATER: Tamsulosin , metoprolol , zetia , nexium , bring rescue inhaler with you    Bring CPAP mask and tubing day of surgery.                              You may not have any metal on your body including hair pins, jewelry, and body piercing             Do not wear , lotions, powders, cologne, or deodorant                Men may shave face and neck.   Do not bring valuables to the hospital. [place] IS NOT             RESPONSIBLE   FOR VALUABLES.   Contacts, glasses, dentures or bridgework may not be worn into surgery.   Bring small overnight bag day of surgery.   DO NOT BRING YOUR HOME MEDICATIONS TO THE HOSPITAL. PHARMACY WILL DISPENSE MEDICATIONS LISTED ON YOUR MEDICATION LIST TO YOU DURING YOUR ADMISSION IN THE HOSPITAL!    Patients discharged on the day of surgery will not be allowed to drive home.  Someone NEEDS to stay with you for the first 24 hours after anesthesia.   Special Instructions: Bring a copy of your healthcare power of attorney and living will documents the day of surgery if you haven't scanned them before.              Please read over the following fact sheets you were given: IF YOU HAVE QUESTIONS ABOUT YOUR PRE-OP INSTRUCTIONS PLEASE CALL [phone]    If you test positive for Covid or have been in contact with anyone that has tested positive in the last 10 days please notify you  surgeon.   [place]- Preparing for Total Shoulder Arthroplasty    Before surgery, you can play an important role. Because skin is not sterile, your skin needs to be as free of germs as possible. You can reduce the number of germs on your skin by using the following products. Benzoyl Peroxide Gel Reduces the number of germs present on the skin Applied twice a day to shoulder area starting two days before surgery    ==================================================================  Please follow these instructions carefully:  BENZOYL PEROXIDE 5% GEL  Please do not use if you have an allergy to benzoyl peroxide.   If your skin becomes reddened/irritated stop using the benzoyl peroxide.  Starting two days before surgery, apply as follows: Apply benzoyl peroxide in the morning and at night. Apply after taking a shower. If you are not taking a shower clean entire shoulder front, back, and side along with the armpit with a clean wet washcloth.  Place a quarter-sized dollop on your shoulder and rub in thoroughly, making sure to cover the front, back, and side of your shoulder, along with the armpit.   2 days before ____ AM   ____ PM              1 day before ____ AM   ____ PM  Do this twice a day for two days.  (Last application is the night before surgery, AFTER using the CHG soap as described below).  Do NOT apply benzoyl peroxide gel on the day of surgery.      Pre-operative 5 CHG Bath Instructions   You can play a key role in reducing the risk of infection after surgery. Your skin needs to be as free of germs as possible. You can reduce the number of germs on your skin by washing with CHG (chlorhexidine  gluconate) soap before surgery. CHG is an antiseptic soap that kills germs and continues to kill germs even after washing.   DO NOT use if you have an allergy to chlorhexidine /CHG or antibacterial soaps. If your skin becomes reddened or irritated, stop using  the CHG and notify one of our RNs at [phone].   Please shower with the CHG soap starting 4 days before surgery using the following schedule:     Please keep in mind the following:  DO NOT shave, including legs and underarms, starting the day of your first shower.   You may shave your face at any point before/day of surgery.  Place clean sheets on your bed the day you start using CHG soap. Use a clean washcloth (not used since being washed) for each shower. DO NOT sleep with pets once you start using the CHG.   CHG Shower Instructions:  If you choose to wash your hair and private area, wash first with your normal shampoo/soap.  After you use shampoo/soap, rinse your hair and body thoroughly to remove shampoo/soap residue.  Turn the water OFF and apply about 3 tablespoons (45 ml) of CHG soap to a CLEAN washcloth.  Apply CHG soap ONLY FROM YOUR NECK DOWN TO YOUR TOES (washing for 3-5 minutes)  DO NOT use CHG soap on face, private areas, open wounds, or sores.  Pay special attention to the area where your surgery is being performed.  If you are having back surgery, having someone wash your back for you may be helpful. Wait 2 minutes after CHG soap is applied, then you may rinse off the CHG soap.  Pat dry with a clean towel  Put on clean clothes/pajamas   If you choose to wear lotion, please use ONLY the CHG-compatible lotions on the back of this paper.     Additional instructions for the day of surgery: DO NOT APPLY any lotions, deodorants, cologne, or perfumes.   Put on clean/comfortable clothes.  Brush your teeth.  Ask your nurse before applying any prescription medications to the skin.      CHG Compatible Lotions   Aveeno Moisturizing lotion  Cetaphil Moisturizing Cream  Cetaphil Moisturizing Lotion  Clairol Herbal Essence Moisturizing Lotion, Dry Skin  Clairol Herbal Essence Moisturizing Lotion, Extra Dry Skin  Clairol Herbal Essence Moisturizing Lotion, Normal Skin   Curel Age Defying Therapeutic Moisturizing Lotion with Alpha Hydroxy  Curel Extreme Care Body Lotion  Curel Soothing Hands Moisturizing Hand Lotion  Curel Therapeutic Moisturizing Cream, Fragrance-Free  Curel Therapeutic Moisturizing Lotion, Fragrance-Free  Curel Therapeutic Moisturizing Lotion, Original Formula  Eucerin Daily Replenishing Lotion  Eucerin Dry Skin Therapy Plus Alpha Hydroxy Crme  Eucerin Dry Skin Therapy Plus Alpha Hydroxy Lotion  Eucerin Original Crme  Eucerin Original Lotion  Eucerin Plus Crme Eucerin Plus Lotion  Eucerin TriLipid Replenishing Lotion  Keri Anti-Bacterial Hand Lotion  Keri Deep Conditioning Original Lotion Dry Skin Formula Softly Scented  Keri Deep Conditioning Original Lotion, Fragrance Free Sensitive Skin Formula  Keri Lotion Fast [employer] Fragrance Free Sensitive Skin Formula  Keri Lotion Fast Absorbing Softly Scented Dry Skin Formula  Keri Original Lotion  Keri Skin Renewal Lotion Keri Silky Smooth Lotion  Keri Silky Smooth Sensitive Skin Lotion  Nivea Body Creamy Conditioning Oil  Nivea Body Extra Enriched Lotion  Nivea Body Original Lotion  Nivea Body Sheer Moisturizing Lotion Nivea Crme  Nivea Skin Firming Lotion  NutraDerm 30 Skin Lotion  NutraDerm Skin Lotion  NutraDerm Therapeutic Skin Cream  NutraDerm Therapeutic Skin Lotion  ProShield Protective Hand Cream  Provon moisturizing lotion   Incentive Spirometer  An incentive spirometer is a tool that can help keep your lungs clear and active. This tool measures how well you are filling your lungs with each breath. Taking long deep breaths may help reverse or decrease the chance of developing breathing (pulmonary) problems (especially infection) following: A long period of time when you are unable to move or be active. BEFORE THE PROCEDURE  If the spirometer includes an indicator to show your best effort, your nurse or respiratory therapist will set it to a desired goal. If  possible, sit up straight or lean slightly forward. Try not to slouch. Hold the incentive spirometer in an upright position. INSTRUCTIONS FOR USE  Sit on the edge of your bed if possible, or sit up as far as you can in bed or on a chair. Hold the incentive spirometer in an upright position. Breathe out normally. Place the mouthpiece in your mouth and seal your lips tightly around it. Breathe in slowly and as deeply as possible, raising the piston or the ball toward the top of the column. Hold your breath for 3-5 seconds or for as long as possible. Allow the piston or ball to fall to the bottom of the column. Remove the mouthpiece from your mouth and breathe out normally. Rest for a few seconds and repeat Steps 1 through 7 at least 10 times every 1-2 hours when you are awake. Take your time and take a few normal breaths between deep breaths. The spirometer may include an indicator to show your best effort. Use the indicator as a goal to work toward during each repetition. After each set of 10 deep breaths, practice coughing to be sure your lungs are clear. If you have an incision (the cut made at the time of surgery), support your incision when coughing by placing a pillow or rolled up towels firmly against it. Once you are able to get out of bed, walk around indoors and cough well. You may stop using the incentive spirometer when instructed by your caregiver.  RISKS AND COMPLICATIONS Take your time so you do not get dizzy or light-headed. If you are in pain, you may need to take or ask for pain medication before doing incentive spirometry. It is harder to take a deep breath if you are having pain. AFTER USE Rest and breathe slowly and easily. It can be helpful to keep track of a log of your progress. Your caregiver can provide you with a simple table to help with this. If you are using the spirometer at home, follow these instructions: SEEK MEDICAL CARE IF:  You are having difficultly using the  spirometer. You have trouble using the spirometer as often as instructed. Your pain medication is not giving enough relief while using the spirometer. You develop fever of 100.5 F (38.1 C) or higher. SEEK IMMEDIATE MEDICAL CARE IF:  You cough up bloody sputum that had not been present  before. You develop fever of 102 F (38.9 C) or greater. You develop worsening pain at or near the incision site. MAKE SURE YOU:  Understand these instructions. Will watch your condition. Will get help right away if you are not doing well or get worse. Document Released: 05/06/2006 Document Revised: 03/18/2011 Document Reviewed: 07/07/2006 [facility] Patient Information 2014 [place], [place].   ________________________________________________________________________

## 2023-06-11 NOTE — Progress Notes (Signed)
 PCP - Burdette Carolin, DO  Cardiologist -Prudy Brownie 11-26-22 / Tele visit 05-12-23 preop eval Slater Duncan, NP  PPM/ICD -  Device Orders -  Rep Notified -   Chest x-ray - 1V 01-18-23 epic EKG -  Stress Test -  ECHO - 2022 epic Cardiac Cath -   Sleep Study -  CPAP -   Fasting Blood Sugar -  Checks Blood Sugar _____ times a day  Blood Thinner Instructions: Aspirin  Instructions:81mg  hold 5-7- days  ERAS Protcol - PRE-SURGERY  G2-    COVID vaccine -  Activity-- Anesthesia review:   Patient denies shortness of breath, fever, cough and chest pain at PAT appointment   All instructions explained to the patient, with a verbal understanding of the material. Patient agrees to go over the instructions while at home for a better understanding. Patient also instructed to self quarantine after being tested for COVID-19. The opportunity to ask questions was provided.

## 2023-06-12 ENCOUNTER — Encounter (HOSPITAL_COMMUNITY)
Admission: RE | Admit: 2023-06-12 | Discharge: 2023-06-12 | Disposition: A | Discharge status: Home / Self Care | Payer: PRIVATE HEALTH INSURANCE | Source: Ambulatory Visit | Attending: Orthopedic Surgery | Admitting: Orthopedic Surgery

## 2023-06-12 ENCOUNTER — Other Ambulatory Visit: Payer: Self-pay

## 2023-06-12 ENCOUNTER — Encounter (HOSPITAL_COMMUNITY): Payer: Self-pay

## 2023-06-12 VITALS — BP 133/72 | HR 60 | Temp 97.8°F | Resp 16 | Ht 65.0 in | Wt 162.0 lb

## 2023-06-12 DIAGNOSIS — N1831 Chronic kidney disease, stage 3a: Secondary | ICD-10-CM | POA: Insufficient documentation

## 2023-06-12 DIAGNOSIS — M19011 Primary osteoarthritis, right shoulder: Secondary | ICD-10-CM | POA: Diagnosis not present

## 2023-06-12 DIAGNOSIS — J449 Chronic obstructive pulmonary disease, unspecified: Secondary | ICD-10-CM | POA: Diagnosis not present

## 2023-06-12 DIAGNOSIS — Z01818 Encounter for other preprocedural examination: Secondary | ICD-10-CM | POA: Diagnosis not present

## 2023-06-12 DIAGNOSIS — Z87891 Personal history of nicotine dependence: Secondary | ICD-10-CM | POA: Diagnosis not present

## 2023-06-12 DIAGNOSIS — I10 Essential (primary) hypertension: Secondary | ICD-10-CM

## 2023-06-12 DIAGNOSIS — Z951 Presence of aortocoronary bypass graft: Secondary | ICD-10-CM | POA: Insufficient documentation

## 2023-06-12 DIAGNOSIS — Z9049 Acquired absence of other specified parts of digestive tract: Secondary | ICD-10-CM | POA: Insufficient documentation

## 2023-06-12 DIAGNOSIS — Z7982 Long term (current) use of aspirin: Secondary | ICD-10-CM | POA: Insufficient documentation

## 2023-06-12 DIAGNOSIS — M25811 Other specified joint disorders, right shoulder: Secondary | ICD-10-CM | POA: Diagnosis not present

## 2023-06-12 DIAGNOSIS — I444 Left anterior fascicular block: Secondary | ICD-10-CM | POA: Insufficient documentation

## 2023-06-12 DIAGNOSIS — R001 Bradycardia, unspecified: Secondary | ICD-10-CM | POA: Diagnosis not present

## 2023-06-12 DIAGNOSIS — I2510 Atherosclerotic heart disease of native coronary artery without angina pectoris: Secondary | ICD-10-CM | POA: Diagnosis not present

## 2023-06-12 DIAGNOSIS — Z8501 Personal history of malignant neoplasm of esophagus: Secondary | ICD-10-CM | POA: Diagnosis not present

## 2023-06-12 DIAGNOSIS — M75101 Unspecified rotator cuff tear or rupture of right shoulder, not specified as traumatic: Secondary | ICD-10-CM | POA: Insufficient documentation

## 2023-06-12 DIAGNOSIS — I129 Hypertensive chronic kidney disease with stage 1 through stage 4 chronic kidney disease, or unspecified chronic kidney disease: Secondary | ICD-10-CM | POA: Insufficient documentation

## 2023-06-12 LAB — BASIC METABOLIC PANEL WITH GFR
Anion gap: 8 (ref 5–15)
BUN: 17 mg/dL (ref 8–23)
CO2: 27 mmol/L (ref 22–32)
Calcium: 9.1 mg/dL (ref 8.9–10.3)
Chloride: 105 mmol/L (ref 98–111)
Creatinine, Ser: 1.13 mg/dL (ref 0.61–1.24)
GFR, Estimated: >60 mL/min (ref >60)
Glucose, Bld: 103 mg/dL — ABNORMAL HIGH (ref 70–99)
Potassium: 4 mmol/L (ref 3.5–5.1)
Sodium: 140 mmol/L (ref 135–145)

## 2023-06-12 LAB — CBC
HCT: 41.3 % (ref 39.0–52.0)
Hemoglobin: 12.9 g/dL — ABNORMAL LOW (ref 13.0–17.0)
MCH: 31.2 pg (ref 26.0–34.0)
MCHC: 31.2 g/dL (ref 30.0–36.0)
MCV: 100 fL (ref 80.0–100.0)
Platelets: 202 10*3/uL (ref 150–400)
RBC: 4.13 MIL/uL — ABNORMAL LOW (ref 4.22–5.81)
RDW: 13.9 % (ref 11.5–15.5)
WBC: 7.4 10*3/uL (ref 4.0–10.5)
nRBC: 0 % (ref 0.0–0.2)

## 2023-06-13 NOTE — Anesthesia Preprocedure Evaluation (Addendum)
 Anesthesia Evaluation  Patient identified by MRN, date of birth, ID band Patient awake    Reviewed: Allergy & Precautions, NPO status , Patient's Chart, lab work & pertinent test results, reviewed documented beta blocker date and time   Airway Mallampati: II  TM Distance: >3 FB     Dental  (+) Partial Upper, Dental Advisory Given, Missing   Pulmonary asthma , pneumonia, resolved, COPD,  COPD inhaler, former smoker   Pulmonary exam normal breath sounds clear to auscultation       Cardiovascular hypertension, Pt. on medications and Pt. 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 Left ventricular ejection fraction, by estimation, is 55 to 60%. The  left ventricle has normal function. The left ventricle has no regional  wall motion abnormalities. There is moderate asymmetric left ventricular  hypertrophy of the basal-septal  segment.   2. Right ventricular systolic function is normal. The right ventricular  size is normal.   3. The inferior vena cava is dilated in size with >50% respiratory  variability, suggesting right atrial pressure of 8 mmHg.    Cardiac Cath 05/30/20  Ost LM to Dist LM lesion is 90% stenosed.  Mid LAD lesion is 90% stenosed.  Prox Cx lesion is 50% stenosed.  1st Mrg lesion is 70% stenosed.  Ost RCA to Prox RCA lesion is 50% stenosed.  Mid RCA lesion is 70% stenosed.  Dist RCA lesion is 70% stenosed.  LV end diastolic pressure is normal.     Neuro/Psych  PSYCHIATRIC DISORDERS      Hx/o ADHD - pt. deniesnegative neurological ROS     GI/Hepatic Neg liver ROS, hiatal hernia,GERD  Medicated,,Hx/o esophageal Ca s/p esophageal resection and gastric  pull-through   Endo/Other  HLD  Renal/GU Renal diseaseHx/o renal calculi  negative genitourinary   Musculoskeletal Right shoulder rotator cuff tear Impingement syndrome right shoulder  Acromioclavicular osteoarthritis right shoulder   Abdominal   Peds  Hematology  (+) Blood dyscrasia, anemia   Anesthesia Other Findings   Reproductive/Obstetrics                             Anesthesia Physical Anesthesia Plan  ASA: 3  Anesthesia Plan: General   Post-op Pain Management: Regional block* and Minimal or no pain anticipated   Induction: Intravenous  PONV Risk Score and Plan: 3 and Treatment may vary due to age or medical condition, Ondansetron  and Dexamethasone   Airway Management Planned: Oral ETT  Additional Equipment: None  Intra-op Plan:   Post-operative Plan: Extubation in OR  Informed Consent: I have reviewed the patients History and Physical, chart, labs and discussed the procedure including the risks, benefits and alternatives for the proposed anesthesia with the patient or authorized representative who has indicated his/her understanding and acceptance.     Dental advisory given  Plan Discussed with: CRNA and Anesthesiologist  Anesthesia Plan Comments: (See PAT note 06/12/2023)       Anesthesia Quick Evaluation

## 2023-06-13 NOTE — Progress Notes (Signed)
 Anesthesia Chart Review   Case: [id number] Date/Time: 06/20/23 0700   Procedures:      ARTHROSCOPY, SHOULDER, WITH ROTATOR CUFF REPAIR (Right: Shoulder)     DECOMPRESSION, SUBACROMIAL SPACE (Right: Shoulder)   Anesthesia type: Choice   Pre-op diagnosis: Right shoulder rotator cuff tear, impingement, acromioclavicular osteoarthritis   Location: [facility] ROOM 07 / [facility]   Surgeons: Janeth Medicus, MD       DISCUSSION:73 y.o. former smoker with h/o HTN, COPD, CAD s/p CABG 2022, s/p right endarterectomy, CKD Stage III, esophageal cancer s/p esophageal resection and gastric pull-through, right shoulder rotator cuff tear scheduled for above procedure 06/20/2023 with Dr. Janeth Medicus.   Per cardiology preoperative evaluation 05/12/2023, " Preoperative Cardiovascular Risk Assessment: According to the Revised Cardiac Risk Index (RCRI), his Perioperative Risk of Major Cardiac Event is (%): 0.9. His Functional Capacity in METs is: 6.61 according to the Duke Activity Status Index (DASI). The patient is doing well from a cardiac perspective. Therefore, based on ACC/AHA guidelines, the patient would be at acceptable risk for the planned procedure without further cardiovascular testing.    The patient was advised that if he develops new symptoms prior to surgery to contact our office to arrange for a follow-up visit, and he verbalized understanding.   Regarding ASA therapy, we recommend continuation of ASA throughout the perioperative period. However, if the surgeon feels that cessation of ASA is required in the perioperative period, it may be stopped 5-7 days prior to surgery with a plan to resume it as soon as felt to be feasible from a surgical standpoint in the post-operative period."  VS: BP 133/72   Pulse 60   Temp 36.6 C (Oral)   Resp 16   Ht 5\' 5"  (1.651 m)   Wt 73.5 kg   SpO2 99%   BMI 26.96 kg/m   PROVIDERS: Ava Lei, DO is PCP   Primary Cardiologist:  Armida Lander, MD   LABS: Labs reviewed: Acceptable for surgery. (all labs ordered are listed, but only abnormal results are displayed)  Labs Reviewed  BASIC METABOLIC PANEL WITH GFR - Abnormal; Notable for the following components:      Result Value   Glucose, Bld 103 (*)    All other components within normal limits  CBC - Abnormal; Notable for the following components:   RBC 4.13 (*)    Hemoglobin 12.9 (*)    All other components within normal limits     IMAGES: VAS US  Carotid 05/08/2022 Right Carotid: The ECA appears >50% stenosed. Patent right ICA  endarterectomy                site without evidence of hemodynamically significant  stenosis.   Left Carotid: Velocities in the left ICA are consistent with a 1-39%  stenosis.   EKG:   CV: Echo 10/16/2020 1. Left ventricular ejection fraction, by estimation, is 55 to 60%. The  left ventricle has normal function. The left ventricle has no regional  wall motion abnormalities. There is moderate asymmetric left ventricular  hypertrophy of the basal-septal  segment.   2. Right ventricular systolic function is normal. The right ventricular  size is normal.   3. The inferior vena cava is dilated in size with >50% respiratory  variability, suggesting right atrial pressure of 8 mmHg.  Past Medical History:  Diagnosis Date   ADHD (attention deficit hyperactivity disorder)    denies no meds   Anemia    Aspiration pneumonia (HCC)    Asthma  exersise induced   CAD (coronary artery disease)    a. s/p CABG 05/2020.   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   LIMA to LAD, SVG to OM, SVG to RCA    Past Surgical History:  Procedure Laterality Date   COLONOSCOPY WITH ESOPHAGOGASTRODUODENOSCOPY (EGD)     CORONARY ARTERY BYPASS GRAFT N/A 06/02/2020   Procedure: CORONARY ARTERY BYPASS GRAFTING (CABG) TIMES THREE USING RIGHT GREATER SAPHENOUS VEIN HARVESTED ENDOSCOPICALLY AND LEFT INTERNAL MAMMARY ARTERY.;  Surgeon: Gardenia Jump, MD;  Location: [facility] OR;  Service: Open Heart Surgery;  Laterality: N/A;   CORONARY ARTERY BYPASS GRAFT  06/02/2020   CYSTOSCOPY/RETROGRADE/URETEROSCOPY/STONE EXTRACTION WITH BASKET     ENDARTERECTOMY Right 06/12/2021   Procedure: RIGHT CAROTID ENDARTERECTOMY WITH PATCH ANGIOPLASTY;  Surgeon: Adine Hoof, MD;  Location: [facility] OR;  Service: Vascular;  Laterality: Right;   ESOPHAGECTOMY     Ivor-Lewis esophagectomy 1995 [facility]   IR THORACENTESIS ASP PLEURAL SPACE W/IMG GUIDE  06/06/2020   LEFT HEART CATH AND CORONARY ANGIOGRAPHY N/A 05/30/2020   Procedure: LEFT HEART CATH AND CORONARY ANGIOGRAPHY;  Surgeon: Swaziland, Peter M, MD;  Location: [facility] INVASIVE CV LAB;  Service: Cardiovascular;  Laterality: N/A;   partial esophageal ejectory     SHOULDER ARTHROSCOPY WITH ROTATOR CUFF REPAIR Left 11/09/2021   Procedure: SHOULDER ARTHROSCOPY WITH ROTATOR CUFF REPAIR BICEPS TENODESIS;  Surgeon: Janeth Medicus, MD;  Location: [facility];  Service: Orthopedics;  Laterality: Left;  120   SHOULDER OPEN ROTATOR CUFF REPAIR Left 10/26/2019   Procedure: ROTATOR CUFF REPAIR SHOULDER OPEN LEFT;  Surgeon: Darrin Emerald, MD;  Location: [facility];  Service: Orthopedics;  Laterality: Left;    MEDICATIONS:  acetaminophen  (TYLENOL ) 500 MG tablet   albuterol  (VENTOLIN  HFA) 108 (90 Base) MCG/ACT inhaler   aspirin  EC 81 MG EC tablet   B Complex Vitamins (VITAMIN B COMPLEX  PO)   Cholecalciferol  (VITAMIN D3 MAXIMUM STRENGTH) 125 MCG (5000 UT) capsule   cyclobenzaprine (FLEXERIL) 5 MG tablet   diclofenac  sodium  (VOLTAREN ) 1 % GEL   esomeprazole  (NEXIUM ) 40 MG capsule   ezetimibe  (ZETIA ) 10 MG tablet   furosemide  (LASIX ) 20 MG tablet   glipiZIDE  (GLUCOTROL  XL) 2.5 MG 24 hr tablet   hydrocortisone 2.5 % cream   ketoconazole (NIZORAL) 2 % cream   lisinopril  (ZESTRIL ) 2.5 MG tablet   meloxicam (MOBIC) 15 MG tablet   metoprolol  tartrate (LOPRESSOR ) 100 MG tablet   rosuvastatin  (CRESTOR ) 40 MG tablet   tamsulosin  (FLOMAX ) 0.4 MG CAPS capsule   No current facility-administered medications for this encounter.   Chick Cotton Ward, PA-C [facility]-Surgical Testing [phone]

## 2023-06-19 ENCOUNTER — Encounter: Payer: Self-pay | Admitting: Cardiology

## 2023-06-19 ENCOUNTER — Encounter (HOSPITAL_COMMUNITY): Payer: Self-pay | Admitting: Orthopedic Surgery

## 2023-06-20 ENCOUNTER — Other Ambulatory Visit: Payer: Self-pay

## 2023-06-20 ENCOUNTER — Ambulatory Visit (HOSPITAL_COMMUNITY): Payer: Self-pay | Admitting: Physician Assistant

## 2023-06-20 ENCOUNTER — Ambulatory Visit (HOSPITAL_BASED_OUTPATIENT_CLINIC_OR_DEPARTMENT_OTHER): Payer: Self-pay | Admitting: Anesthesiology

## 2023-06-20 ENCOUNTER — Ambulatory Visit (HOSPITAL_COMMUNITY)
Admission: RE | Admit: 2023-06-20 | Discharge: 2023-06-20 | Disposition: A | Discharge status: Home / Self Care | Payer: PRIVATE HEALTH INSURANCE | Attending: Orthopedic Surgery | Admitting: Orthopedic Surgery

## 2023-06-20 ENCOUNTER — Encounter (HOSPITAL_COMMUNITY)
Admission: RE | Disposition: A | Discharge status: Home / Self Care | Payer: Self-pay | Source: Home / Self Care | Attending: Orthopedic Surgery

## 2023-06-20 ENCOUNTER — Encounter (HOSPITAL_COMMUNITY): Payer: Self-pay | Admitting: Orthopedic Surgery

## 2023-06-20 DIAGNOSIS — I252 Old myocardial infarction: Secondary | ICD-10-CM | POA: Diagnosis not present

## 2023-06-20 DIAGNOSIS — M7521 Bicipital tendinitis, right shoulder: Secondary | ICD-10-CM | POA: Insufficient documentation

## 2023-06-20 DIAGNOSIS — M810 Age-related osteoporosis without current pathological fracture: Secondary | ICD-10-CM | POA: Diagnosis not present

## 2023-06-20 DIAGNOSIS — I2510 Atherosclerotic heart disease of native coronary artery without angina pectoris: Secondary | ICD-10-CM | POA: Diagnosis not present

## 2023-06-20 DIAGNOSIS — Z87891 Personal history of nicotine dependence: Secondary | ICD-10-CM | POA: Diagnosis not present

## 2023-06-20 DIAGNOSIS — M25811 Other specified joint disorders, right shoulder: Secondary | ICD-10-CM | POA: Insufficient documentation

## 2023-06-20 DIAGNOSIS — I25110 Atherosclerotic heart disease of native coronary artery with unstable angina pectoris: Secondary | ICD-10-CM | POA: Diagnosis not present

## 2023-06-20 DIAGNOSIS — M75121 Complete rotator cuff tear or rupture of right shoulder, not specified as traumatic: Secondary | ICD-10-CM

## 2023-06-20 DIAGNOSIS — R7303 Prediabetes: Secondary | ICD-10-CM | POA: Insufficient documentation

## 2023-06-20 DIAGNOSIS — Z7984 Long term (current) use of oral hypoglycemic drugs: Secondary | ICD-10-CM | POA: Insufficient documentation

## 2023-06-20 DIAGNOSIS — M19011 Primary osteoarthritis, right shoulder: Secondary | ICD-10-CM | POA: Insufficient documentation

## 2023-06-20 DIAGNOSIS — Z951 Presence of aortocoronary bypass graft: Secondary | ICD-10-CM | POA: Insufficient documentation

## 2023-06-20 DIAGNOSIS — S43491A Other sprain of right shoulder joint, initial encounter: Secondary | ICD-10-CM | POA: Diagnosis not present

## 2023-06-20 DIAGNOSIS — I129 Hypertensive chronic kidney disease with stage 1 through stage 4 chronic kidney disease, or unspecified chronic kidney disease: Secondary | ICD-10-CM

## 2023-06-20 DIAGNOSIS — X58XXXA Exposure to other specified factors, initial encounter: Secondary | ICD-10-CM | POA: Insufficient documentation

## 2023-06-20 DIAGNOSIS — N1831 Chronic kidney disease, stage 3a: Secondary | ICD-10-CM | POA: Insufficient documentation

## 2023-06-20 DIAGNOSIS — J4489 Other specified chronic obstructive pulmonary disease: Secondary | ICD-10-CM | POA: Insufficient documentation

## 2023-06-20 DIAGNOSIS — K219 Gastro-esophageal reflux disease without esophagitis: Secondary | ICD-10-CM | POA: Insufficient documentation

## 2023-06-20 LAB — GLUCOSE, CAPILLARY: Glucose-Capillary: 98 mg/dL (ref 70–99)

## 2023-06-20 SURGERY — ARTHROSCOPY, SHOULDER, WITH ROTATOR CUFF REPAIR
Anesthesia: General | Site: Shoulder | Laterality: Right

## 2023-06-20 MED ORDER — EPINEPHRINE 1 MG/ML IJ SOLN
INTRAMUSCULAR | Status: DC | PRN
Start: 2023-06-20 — End: 2023-06-20
  Administered 2023-06-20: 1 mg

## 2023-06-20 MED ORDER — BUPIVACAINE HCL (PF) 0.5 % IJ SOLN
INTRAMUSCULAR | Status: DC | PRN
  Administered 2023-06-20: 15 mL via PERINEURAL

## 2023-06-20 MED ORDER — ROCURONIUM BROMIDE 10 MG/ML (PF) SYRINGE
PREFILLED_SYRINGE | INTRAVENOUS | Status: AC
  Filled 2023-06-20: qty 10

## 2023-06-20 MED ORDER — OXYCODONE HCL 5 MG/5ML PO SOLN: 5.0000 mg | Freq: Once | ORAL | Status: DC | PRN

## 2023-06-20 MED ORDER — PHENYLEPHRINE HCL (PRESSORS) 10 MG/ML IV SOLN
INTRAVENOUS | Status: DC | PRN
  Administered 2023-06-20 (×2): 160 ug via INTRAVENOUS

## 2023-06-20 MED ORDER — OXYCODONE HCL 5 MG PO TABS: 5.0000 mg | ORAL_TABLET | ORAL | 0 refills | Status: DC | PRN

## 2023-06-20 MED ORDER — SODIUM CHLORIDE 0.9 % IR SOLN
Status: DC | PRN
  Administered 2023-06-20: 6000 mL

## 2023-06-20 MED ORDER — MIDAZOLAM HCL 2 MG/2ML IJ SOLN
INTRAMUSCULAR | Status: AC
Start: 2023-06-20 — End: 2023-06-20
  Filled 2023-06-20: qty 2

## 2023-06-20 MED ORDER — LACTATED RINGERS IV SOLN: INTRAVENOUS | Status: DC | PRN

## 2023-06-20 MED ORDER — MIDAZOLAM HCL 5 MG/5ML IJ SOLN
INTRAMUSCULAR | Status: DC | PRN
  Administered 2023-06-20: 1 mg via INTRAVENOUS

## 2023-06-20 MED ORDER — ONDANSETRON HCL 4 MG/2ML IJ SOLN
INTRAMUSCULAR | Status: AC
Start: 2023-06-20 — End: 2023-06-20
  Filled 2023-06-20: qty 2

## 2023-06-20 MED ORDER — LIDOCAINE HCL (PF) 2 % IJ SOLN
INTRAMUSCULAR | Status: AC
Start: 2023-06-20 — End: 2023-06-20
  Filled 2023-06-20: qty 5

## 2023-06-20 MED ORDER — INSULIN ASPART 100 UNIT/ML IJ SOLN: 0.0000 [IU] | INTRAMUSCULAR | Status: DC | PRN

## 2023-06-20 MED ORDER — HYDROMORPHONE HCL 1 MG/ML IJ SOLN: 0.2500 mg | INTRAMUSCULAR | Status: DC | PRN

## 2023-06-20 MED ORDER — FENTANYL CITRATE (PF) 100 MCG/2ML IJ SOLN
INTRAMUSCULAR | Status: DC | PRN
  Administered 2023-06-20 (×2): 50 ug via INTRAVENOUS

## 2023-06-20 MED ORDER — EPHEDRINE SULFATE-NACL 50-0.9 MG/10ML-% IV SOSY
PREFILLED_SYRINGE | INTRAVENOUS | Status: DC | PRN
  Administered 2023-06-20: 10 mg via INTRAVENOUS
  Administered 2023-06-20: 5 mg via INTRAVENOUS

## 2023-06-20 MED ORDER — PROPOFOL 10 MG/ML IV BOLUS
INTRAVENOUS | Status: AC
  Filled 2023-06-20: qty 20

## 2023-06-20 MED ORDER — CHLORHEXIDINE GLUCONATE 0.12 % MT SOLN
15.0000 mL | Freq: Once | OROMUCOSAL | Status: AC
  Administered 2023-06-20: 15 mL via OROMUCOSAL

## 2023-06-20 MED ORDER — OXYCODONE HCL 5 MG PO TABS: 5.0000 mg | ORAL_TABLET | Freq: Once | ORAL | Status: DC | PRN

## 2023-06-20 MED ORDER — BUPIVACAINE LIPOSOME 1.3 % IJ SUSP
INTRAMUSCULAR | Status: DC | PRN
  Administered 2023-06-20: 10 mL via PERINEURAL

## 2023-06-20 MED ORDER — PHENYLEPHRINE HCL-NACL 20-0.9 MG/250ML-% IV SOLN
INTRAVENOUS | Status: DC | PRN
  Administered 2023-06-20: 30 ug/min via INTRAVENOUS

## 2023-06-20 MED ORDER — EPHEDRINE 5 MG/ML INJ
INTRAVENOUS | Status: AC
  Filled 2023-06-20: qty 5

## 2023-06-20 MED ORDER — ALBUMIN HUMAN 5 % IV SOLN: INTRAVENOUS | Status: DC | PRN

## 2023-06-20 MED ORDER — PROPOFOL 10 MG/ML IV BOLUS
INTRAVENOUS | Status: DC | PRN
  Administered 2023-06-20: 140 mg via INTRAVENOUS

## 2023-06-20 MED ORDER — SUGAMMADEX SODIUM 200 MG/2ML IV SOLN
INTRAVENOUS | Status: DC | PRN
  Administered 2023-06-20: 400 mg via INTRAVENOUS

## 2023-06-20 MED ORDER — PHENYLEPHRINE 80 MCG/ML (10ML) SYRINGE FOR IV PUSH (FOR BLOOD PRESSURE SUPPORT)
PREFILLED_SYRINGE | INTRAVENOUS | Status: AC
  Filled 2023-06-20: qty 10

## 2023-06-20 MED ORDER — EPINEPHRINE PF 1 MG/ML IJ SOLN
INTRAMUSCULAR | Status: AC
Start: 2023-06-20 — End: 2023-06-20
  Filled 2023-06-20: qty 1

## 2023-06-20 MED ORDER — DEXAMETHASONE SODIUM PHOSPHATE 10 MG/ML IJ SOLN
INTRAMUSCULAR | Status: AC
  Filled 2023-06-20: qty 1

## 2023-06-20 MED ORDER — ROCURONIUM BROMIDE 100 MG/10ML IV SOLN
INTRAVENOUS | Status: DC | PRN
  Administered 2023-06-20: 70 mg via INTRAVENOUS

## 2023-06-20 MED ORDER — ORAL CARE MOUTH RINSE: 15.0000 mL | Freq: Once | OROMUCOSAL | Status: AC

## 2023-06-20 MED ORDER — LIDOCAINE HCL (CARDIAC) PF 100 MG/5ML IV SOSY
PREFILLED_SYRINGE | INTRAVENOUS | Status: DC | PRN
  Administered 2023-06-20: 60 mg via INTRAVENOUS

## 2023-06-20 MED ORDER — PROPOFOL 10 MG/ML IV BOLUS
INTRAVENOUS | Status: AC
Start: 2023-06-20 — End: 2023-06-20
  Filled 2023-06-20: qty 20

## 2023-06-20 MED ORDER — LACTATED RINGERS IV SOLN: INTRAVENOUS | Status: DC

## 2023-06-20 MED ORDER — ONDANSETRON HCL 4 MG/2ML IJ SOLN
INTRAMUSCULAR | Status: DC | PRN
Start: 2023-06-20 — End: 2023-06-20
  Administered 2023-06-20: 4 mg via INTRAVENOUS

## 2023-06-20 MED ORDER — ONDANSETRON 4 MG PO TBDP: 4.0000 mg | ORAL_TABLET | Freq: Three times a day (TID) | ORAL | 0 refills | Status: DC | PRN

## 2023-06-20 MED ORDER — CEFAZOLIN SODIUM-DEXTROSE 2-4 GM/100ML-% IV SOLN
2.0000 g | INTRAVENOUS | Status: AC
  Administered 2023-06-20: 2 g via INTRAVENOUS
  Filled 2023-06-20: qty 100

## 2023-06-20 MED ORDER — FENTANYL CITRATE (PF) 100 MCG/2ML IJ SOLN
INTRAMUSCULAR | Status: AC
Start: 2023-06-20 — End: 2023-06-20
  Filled 2023-06-20: qty 2

## 2023-06-20 SURGICAL SUPPLY — 60 items
ANCHOR FIBERTAK 2.6X1.7 BLUE (Anchor) IMPLANT
ANCHOR SUT FBRTK 2.6 KNTLS (Anchor) IMPLANT
ANCHOR SWIVELOCK SP KL 4.75 (Anchor) IMPLANT
BAG COUNTER SPONGE SURGICOUNT (BAG) ×1 IMPLANT
BURR OVAL 8 FLU 4.0X13 (MISCELLANEOUS) ×1 IMPLANT
CANNULA 5.75X7 CRYSTAL CLEAR (CANNULA) IMPLANT
CANNULA 5.75X71 LONG (CANNULA) IMPLANT
CANNULA PASSPORT BUTTON 8X4 (CANNULA) IMPLANT
CANNULA TWIST IN 8.25X7CM (CANNULA) IMPLANT
CONNECTOR 5 IN 1 STRAIGHT STRL (MISCELLANEOUS) IMPLANT
COVER FOOTSWITCH UNIV (MISCELLANEOUS) IMPLANT
CUTTER BONE 4.0MM X 13CM (MISCELLANEOUS) IMPLANT
DISSECTOR 3.8MM X 13CM (MISCELLANEOUS) IMPLANT
DRAPE IMP U-DRAPE 54X76 (DRAPES) IMPLANT
DRAPE INCISE IOBAN 66X45 STRL (DRAPES) IMPLANT
DRAPE SHEET LG 3/4 BI-LAMINATE (DRAPES) ×1 IMPLANT
DRAPE STERI 35X30 U-POUCH (DRAPES) ×1 IMPLANT
DRAPE SURG 17X23 STRL (DRAPES) ×1 IMPLANT
DRAPE SURG ORHT 6 SPLT 77X108 (DRAPES) ×2 IMPLANT
DRAPE U-SHAPE 47X51 STRL (DRAPES) ×1 IMPLANT
DURAPREP 26ML APPLICATOR (WOUND CARE) ×1 IMPLANT
ELECT PENCIL ROCKER SW 15FT (MISCELLANEOUS) ×1 IMPLANT
ELECT REM PT RETURN 15FT ADLT (MISCELLANEOUS) IMPLANT
FIBERSTICK 2 (SUTURE) IMPLANT
FILTER STRAW (MISCELLANEOUS) IMPLANT
GAUZE PAD ABD 8X10 STRL (GAUZE/BANDAGES/DRESSINGS) ×3 IMPLANT
GAUZE SPONGE 4X4 12PLY STRL (GAUZE/BANDAGES/DRESSINGS) ×1 IMPLANT
GLOVE BIO SURGEON STRL SZ7.5 (GLOVE) ×2 IMPLANT
GLOVE BIOGEL PI IND STRL 8 (GLOVE) ×2 IMPLANT
GOWN STRL REUS W/ TWL XL LVL3 (GOWN DISPOSABLE) ×2 IMPLANT
IV NS IRRIG 3000ML ARTHROMATIC (IV SOLUTION) ×2 IMPLANT
KIT ANCHOR FBRTK 2.6 STR (KITS) IMPLANT
KIT BASIN OR (CUSTOM PROCEDURE TRAY) ×1 IMPLANT
KIT TURNOVER KIT A (KITS) ×1 IMPLANT
MANIFOLD NEPTUNE II (INSTRUMENTS) ×1 IMPLANT
NDL 1/2 CIR CATGUT .05X1.09 (NEEDLE) IMPLANT
NDL HD SCORPION MEGA LOADER (NEEDLE) IMPLANT
NDL SAFETY ECLIPSE 18X1.5 (NEEDLE) IMPLANT
NEEDLE 1/2 CIR CATGUT .05X1.09 (NEEDLE) IMPLANT
PACK ARTHROSCOPY WL (CUSTOM PROCEDURE TRAY) ×1 IMPLANT
PROBE BIPOLAR ATHRO 135MM 90D (MISCELLANEOUS) IMPLANT
SLEEVE ARM SUSPENSION SYSTEM (MISCELLANEOUS) ×1 IMPLANT
SLING S3 LATERAL DISP (MISCELLANEOUS) ×1 IMPLANT
SLING ULTRA II L (ORTHOPEDIC SUPPLIES) IMPLANT
SPONGE T-LAP 4X18 ~~LOC~~+RFID (SPONGE) IMPLANT
STRIP CLOSURE SKIN 1/2X4 (GAUZE/BANDAGES/DRESSINGS) IMPLANT
SUT MNCRL AB 3-0 PS2 27 (SUTURE) ×1 IMPLANT
SUT PDS AB 0 CT1 36 (SUTURE) IMPLANT
SUT TIGER TAPE 7 IN WHITE (SUTURE) IMPLANT
SUT VIC AB 0 CT1 36 (SUTURE) ×1 IMPLANT
SUT VIC AB 2-0 CT1 TAPERPNT 27 (SUTURE) IMPLANT
SUTURE FIBERWR #2 38 T-5 BLUE (SUTURE) IMPLANT
SYR 27GX1/2 1ML LL SAFETY (SYRINGE) IMPLANT
SYR BULB IRRIG 60ML STRL (SYRINGE) IMPLANT
TAPE CLOTH SURG 6X10 NS LF (GAUZE/BANDAGES/DRESSINGS) ×1 IMPLANT
TOWEL OR 17X26 10 PK STRL BLUE (TOWEL DISPOSABLE) ×1 IMPLANT
TUBING ARTHROSCOPY IRRIG 16FT (MISCELLANEOUS) ×2 IMPLANT
TUBING CONNECTING 10 (TUBING) ×2 IMPLANT
WAND ABLATOR APOLLO I90 (BUR) ×1 IMPLANT
WATER STERILE IRR 500ML POUR (IV SOLUTION) ×1 IMPLANT

## 2023-06-20 NOTE — Anesthesia Postprocedure Evaluation (Signed)
 Anesthesia Post Note  Patient: James Wolf  Procedure(s) Performed: ARTHROSCOPY, SHOULDER, WITH ROTATOR CUFF REPAIR (Right: Shoulder) DECOMPRESSION, SUBACROMIAL SPACE (Right: Shoulder)     Patient location during evaluation: PACU Anesthesia Type: General Level of consciousness: awake and alert and oriented Pain management: pain level controlled Vital Signs Assessment: post-procedure vital signs reviewed and stable Respiratory status: spontaneous breathing, nonlabored ventilation and respiratory function stable Cardiovascular status: blood pressure returned to baseline and stable Postop Assessment: no apparent nausea or vomiting Anesthetic complications: no   No notable events documented.  Last Vitals:  Vitals:   06/20/23 0915 06/20/23 0928  BP: 135/68   Pulse: (!) 55   Resp: 19   Temp:  (!) 36.3 C  SpO2: 100%     Last Pain:  Vitals:   06/20/23 0910  TempSrc:   PainSc: 0-No pain                 Orene Abbasi A.

## 2023-06-20 NOTE — H&P (Signed)
 ORTHOPAEDIC H and P  REQUESTING PHYSICIAN: Janeth Medicus, MD  PCP:  Ava Lei, DO  Chief Complaint: Right shoulder pain  HPI: James Wolf is a 73 y.o. male who complains of right shoulder pain and weakness.  Here today for arthroscopy and repairs.  No new complaints.  Past Medical History:  Diagnosis Date   ADHD (attention deficit hyperactivity disorder)    denies no meds   Anemia    Aspiration pneumonia (HCC)    Asthma    exersise induced   CAD (coronary artery disease)    a. s/p CABG 05/2020.   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   LIMA to LAD, SVG to OM, SVG to RCA   Past Surgical History:  Procedure Laterality Date   COLONOSCOPY WITH ESOPHAGOGASTRODUODENOSCOPY (EGD)     CORONARY ARTERY BYPASS GRAFT N/A 06/02/2020   Procedure: CORONARY ARTERY BYPASS GRAFTING (CABG) TIMES THREE USING RIGHT GREATER SAPHENOUS VEIN HARVESTED ENDOSCOPICALLY AND LEFT INTERNAL MAMMARY ARTERY.;  Surgeon: Gardenia Jump, MD;  Location: [facility] OR;  Service: Open Heart Surgery;  Laterality: N/A;   CORONARY ARTERY BYPASS GRAFT  06/02/2020   CYSTOSCOPY/RETROGRADE/URETEROSCOPY/STONE EXTRACTION WITH BASKET     ENDARTERECTOMY Right 06/12/2021   Procedure: RIGHT CAROTID ENDARTERECTOMY WITH PATCH ANGIOPLASTY;  Surgeon: Adine Hoof, MD;  Location: [facility] OR;  Service: Vascular;  Laterality: Right;   ESOPHAGECTOMY     Ivor-Lewis esophagectomy 1995 [facility]   IR THORACENTESIS ASP PLEURAL SPACE W/IMG GUIDE  06/06/2020   LEFT HEART CATH AND CORONARY ANGIOGRAPHY N/A  05/30/2020   Procedure: LEFT HEART CATH AND CORONARY ANGIOGRAPHY;  Surgeon: Swaziland, Peter M, MD;  Location: [facility] INVASIVE CV LAB;  Service: Cardiovascular;  Laterality: N/A;   partial esophageal ejectory     SHOULDER ARTHROSCOPY WITH ROTATOR CUFF REPAIR Left 11/09/2021   Procedure: SHOULDER ARTHROSCOPY WITH ROTATOR CUFF REPAIR BICEPS TENODESIS;  Surgeon: Janeth Medicus, MD;  Location: [facility] ORS;  Service: Orthopedics;  Laterality: Left;  120   SHOULDER OPEN ROTATOR CUFF REPAIR Left 10/26/2019   Procedure: ROTATOR CUFF REPAIR SHOULDER OPEN LEFT;  Surgeon: Darrin Emerald, MD;  Location: [facility] ORS;  Service: Orthopedics;  Laterality: Left;   Social History   Socioeconomic History   Marital status: Married    Spouse name: Not on file   Number of children: Not on file   Years of education: Not on file   Highest education level: Not on file  Occupational History   Not on file  Tobacco Use   Smoking status: Former    Current packs/day: 0.00    Average packs/day: 1 pack/day for 25.0 years (25.0 ttl pk-yrs)    Types: Cigarettes    Start date: 02/20/1968    Quit date: 02/19/1993    Years since quitting: 30.3    Passive exposure: Never   Smokeless tobacco: Never  Vaping Use   Vaping status: Never Used  Substance and Sexual Activity  Alcohol use: Yes    Comment: occ   Drug use: Never   Sexual activity: Yes  Other Topics Concern   Not on file  Social History Narrative   Not on file   Social Drivers of Health   Financial Resource Strain: Low Risk  (10/09/2019)   Received from [facility]   Overall Financial Resource Strain (CARDIA)    Difficulty of Paying Living Expenses: Not very hard  Food Insecurity: No Food Insecurity (01/17/2022)   Hunger Vital Sign    Worried About Running Out of Food in the Last Year: Never true    Ran Out of Food in the Last Year: Never true  Transportation Needs: No Transportation Needs (01/17/2022)   PRAPARE - Scientist, research (physical sciences) (Medical): No    Lack of Transportation (Non-Medical): No  Physical Activity: Insufficiently Active (10/09/2019)   Received from [facility]   Exercise Vital Sign    On average, how many days per week do you engage in moderate to strenuous exercise (like a brisk walk)?: 2 days    On average, how many minutes do you engage in exercise at this level?: 10 min  Stress: No Stress Concern Present (10/09/2019)   Received from [facility] Occupational [facility] - Occupational Stress Questionnaire    Feeling of Stress : Only a little  Social Connections: Not on file   Family History  Problem Relation Age of Onset   Heart disease Mother    CAD Father    CVA Father    Allergies  Allergen Reactions   Zithromax [Azithromycin] Diarrhea    Causes pt's stomach to tear up does not want to take medication again.    Prior to Admission medications   Medication Sig Start Date End Date Taking? Authorizing Provider  acetaminophen  (TYLENOL ) 500 MG tablet Take 1,000 mg by mouth every 6 (six) hours as needed for moderate pain.   Yes [provider]  aspirin  EC 81 MG EC tablet Take 1 tablet (81 mg total) by mouth daily. Swallow whole. 06/08/20  Yes Roddenberry, Myron G, PA-C  B Complex Vitamins (VITAMIN B COMPLEX  PO) Take 1 tablet by mouth 2 (two) times daily. 03/10/07  Yes [provider]  Cholecalciferol  (VITAMIN D3 MAXIMUM STRENGTH) 125 MCG (5000 UT) capsule Take 5,000 Units by mouth 2 (two) times daily.   Yes [provider]  cyclobenzaprine (FLEXERIL) 5 MG tablet Take 5 mg by mouth 3 (three) times daily as needed.   Yes [provider]  diclofenac  sodium (VOLTAREN ) 1 % GEL Apply 2 g topically 2 (two) times daily as needed (pain).  09/24/15  Yes [provider]  esomeprazole  (NEXIUM ) 40 MG capsule Take 40 mg by mouth 2 (two) times daily.  05/26/17  Yes [provider]  ezetimibe  (ZETIA ) 10  MG tablet Take 1 tablet (10 mg total) by mouth daily. 11/01/22  Yes BranchJoyceann No, MD  furosemide  (LASIX ) 20 MG tablet Take 1 tablet (20 mg total) by mouth as needed for edema (swelling). 05/21/22  Yes Laurann Pollock, MD  glipiZIDE  (GLUCOTROL  XL) 2.5 MG 24 hr tablet Take 2.5 mg by mouth daily with breakfast. 03/15/21  Yes [provider]  hydrocortisone 2.5 % cream Apply 1 application. topically 2 (two) times daily as needed for rash. 05/29/21  Yes [provider]  ketoconazole (NIZORAL) 2 % cream Apply 1 application. topically 2 (two) times daily as  needed for irritation. 05/29/21  Yes [provider]  lisinopril  (ZESTRIL ) 2.5 MG tablet Take 1 tablet by mouth once daily 05/19/23  Yes Branch, Joyceann No, MD  meloxicam (MOBIC) 15 MG tablet Take 15 mg by mouth daily.   Yes [provider]  metoprolol  tartrate (LOPRESSOR ) 100 MG tablet Take 1/2 (one-half) tablet by mouth twice daily 12/18/22  Yes Branch, Joyceann No, MD  rosuvastatin  (CRESTOR ) 40 MG tablet TAKE 1 TABLET BY MOUTH ONCE DAILY AT 6PM 05/19/23  Yes Branch, Joyceann No, MD  tamsulosin  (FLOMAX ) 0.4 MG CAPS capsule Take 1 capsule (0.4 mg total) by mouth daily. 03/05/22  Yes Edson Graces, MD  albuterol  (VENTOLIN  HFA) 108 (90 Base) MCG/ACT inhaler Inhale 1-2 puffs into the lungs every 6 (six) hours as needed for wheezing or shortness of breath.    [provider]   No results found.  Positive ROS: All other systems have been reviewed and were otherwise negative with the exception of those mentioned in the HPI and as above.  Physical Exam: General: Alert, no acute distress Cardiovascular: No pedal edema Respiratory: No cyanosis, no use of accessory musculature GI: No organomegaly, abdomen is soft and non-tender Skin: No lesions in the area of chief complaint Neurologic: Sensation intact distally Psychiatric: Patient is competent for consent with normal mood and affect Lymphatic: No axillary or  cervical lymphadenopathy  MUSCULOSKELETAL: RUE-  wwp, nvi  Assessment: Right shoulder rotator cuff tear Right shoulder impingement Right shoulder ac osteoarthritis  Plan: -  plan for OR today with arthroscopic repairs and decompression with distal clavicel resection.  - The risks, benefits, and alternatives were discussed with the patient. There are risks associated with the surgery including, but not limited to, problems with anesthesia (death), infection, differences in leg length/angulation/rotation, fracture of bones, loosening or failure of implants, malunion, nonunion, hematoma (blood accumulation) which may require surgical drainage, blood clots, pulmonary embolism, nerve injury (foot drop), and blood vessel injury. The patient understands these risks and elects to proceed.   Dc home post op    Janeth Medicus, MD Cell [phone]    06/20/2023 6:22 AM

## 2023-06-20 NOTE — Anesthesia Procedure Notes (Addendum)
 Procedure Name: Intubation Date/Time: 06/20/2023 7:41 AM  Performed by: Linard Reno, CRNAPre-anesthesia Checklist: Patient identified, Emergency Drugs available, Suction available and Patient being monitored Patient Re-evaluated:Patient Re-evaluated prior to induction Oxygen Delivery Method: Circle system utilized Preoxygenation: Pre-oxygenation with 100% oxygen Induction Type: IV induction Ventilation: Mask ventilation without difficulty Laryngoscope Size: Glidescope and 3 Grade View: Grade I Tube type: Subglottic suction tube Tube size: 7.5 mm Number of attempts: 1 Airway Equipment and Method: Stylet and Video-laryngoscopy Placement Confirmation: ETT inserted through vocal cords under direct vision, breath sounds checked- equal and bilateral and CO2 detector Secured at: 23 cm Tube secured with: Tape Dental Injury: Teeth and Oropharynx as per pre-operative assessment  Comments: Pt with hx of esophageal Ca, s/p resection and unable to lie flat due to risk of aspiration. Pt sitting up for induction. Modified RSI, Easy BMV, Atraumatic intubation with elective glidescope use. OGT placed atraumatically post induction.

## 2023-06-20 NOTE — Brief Op Note (Signed)
 06/20/2023  8:47 AM  PATIENT:  James Wolf  73 y.o. male  PRE-OPERATIVE DIAGNOSIS:  Right shoulder rotator cuff tear, impingement, acromioclavicular osteoarthritis  POST-OPERATIVE DIAGNOSIS:  Right shoulder rotator cuff tear, impingement, acromioclavicular osteoarthritis  PROCEDURE:  Procedure(s): ARTHROSCOPY, SHOULDER, WITH ROTATOR CUFF REPAIR (Right) DECOMPRESSION, SUBACROMIAL SPACE (Right)  SURGEON:  Surgeons and Role:    * Janeth Medicus, MD - Primary  PHYSICIAN ASSISTANT: Karyl Paget, PA-C   ANESTHESIA:   regional and general  EBL:  10 cc  BLOOD ADMINISTERED:none  DRAINS: none   LOCAL MEDICATIONS USED:  NONE  SPECIMEN:  No Specimen  DISPOSITION OF SPECIMEN:  N/A  COUNTS:  YES  TOURNIQUET:  * No tourniquets in log *  DICTATION: .Note written in EPIC  PLAN OF CARE: Discharge to home after PACU  PATIENT DISPOSITION:  PACU - hemodynamically stable.   Delay start of Pharmacological VTE agent (>24hrs) due to surgical blood loss or risk of bleeding: not applicable

## 2023-06-20 NOTE — Discharge Instructions (Addendum)
Orthopedic surgery discharge instructions:  -Maintain postoperative bandages for 3 days.  You may remove these bandages on post op day 3 and begin showering at that time.  Please do not submerge underwater.  -Maintain your arm in sling at all times.  You should only remove for showering and getting dressed.  No lifting with the operative arm.  -For mild to moderate pain use Tylenol and Advil in alternating fashion around-the-clock.  For breakthrough pain use oxycodone as necessary.  -Please apply ice to the right shoulder for 20-30 minutes out of each hour that you are awake.  Do this around-the-clock for the first 3 days from surgery.  -Follow-up in 2 weeks for routine postoperative check.  

## 2023-06-20 NOTE — Transfer of Care (Signed)
 Immediate Anesthesia Transfer of Care Note  Patient: James Wolf  Procedure(s) Performed: ARTHROSCOPY, SHOULDER, WITH ROTATOR CUFF REPAIR (Right: Shoulder) DECOMPRESSION, SUBACROMIAL SPACE (Right: Shoulder)  Patient Location: PACU  Anesthesia Type:General and Regional  Level of Consciousness: awake, alert , and oriented  Airway & Oxygen Therapy: Patient Spontanous Breathing  Post-op Assessment: Report given to RN and Post -op Vital signs reviewed and stable  Post vital signs: Reviewed and stable  Last Vitals:  Vitals Value Taken Time  BP 135/68 06/20/23 09:15  Temp    Pulse 56 06/20/23 09:21  Resp 19 06/20/23 09:21  SpO2 91 % 06/20/23 09:21  Vitals shown include unfiled device data.  Last Pain:  Vitals:   06/20/23 0910  TempSrc:   PainSc: 0-No pain         Complications: No notable events documented.

## 2023-06-20 NOTE — Anesthesia Procedure Notes (Signed)
 Anesthesia Regional Block: Interscalene brachial plexus block   Pre-Anesthetic Checklist: , timeout performed,  Correct Patient, Correct Site, Correct Laterality,  Correct Procedure, Correct Position, site marked,  Risks and benefits discussed,  Surgical consent,  Pre-op evaluation,  At surgeon's request and post-op pain management  Laterality: Right  Prep: chloraprep       Needles:  Injection technique: Single-shot  Needle Type: Echogenic Stimulator Needle     Needle Length: 10cm  Needle Gauge: 21   Needle insertion depth: 6 cm   Additional Needles:   Procedures:,,,, ultrasound used (permanent image in chart),,   Motor weakness within 5 minutes.  Narrative:  Start time: 06/20/2023 7:07 AM End time: 06/20/2023 7:12 AM Injection made incrementally with aspirations every 5 mL.  Performed by: Personally  Anesthesiologist: Tura Gaines, MD  Additional Notes: Timeout performed. Patient sedated. Relevant anatomy ID'd using US . Incremental 2-5ml injection of LA with frequent aspiration. Patient tolerated procedure well.

## 2023-06-20 NOTE — Op Note (Signed)
 Date of Surgery: 06/20/2023  INDICATIONS: [name] is a 73 y.o.-year-old male with a right rotator cuff tear as well as symptomatic AC arthropathy and longstanding impingement.  Here today for surgery.;  The patient did consent to the procedure after discussion of the risks and benefits.  PREOPERATIVE DIAGNOSIS:  1.  Right shoulder rotator cuff tear complete 2.  Right shoulder subacromial impingement 3.  Right shoulder acromioclavicular joint arthritis  POSTOPERATIVE DIAGNOSIS:  1.  Right shoulder rotator cuff tear complete 2.  Right shoulder subacromial impingement 3.  Right shoulder acromioclavicular joint arthritis 4.  Right shoulder biceps tendinitis 5.  Right shoulder degenerative labral fraying/tearing of anterior labrum, superior labrum and posterior labrum   PROCEDURE:  1.  Right shoulder arthroscopic extensive debridement of the superior labrum, anterior labrum, rotator interval as well as biceps tenotomy and subacromial bursectomy. 2.  Right shoulder arthroscopic rotator cuff repair 3.  Right shoulder subacromial decompression with coracoacromial ligament release 4.  Right shoulder distal clavicle resection and 1 cm of distal clavicle  SURGEON: Brigitte Canard, M.D.  ASSIST: Karyl Paget, PA-C  Assistant attestation:  PA Mcclung scrubbed and present for the entire procedure..  ANESTHESIA:  general, with interscalene with Exparel   IV FLUIDS AND URINE: See anesthesia.  ESTIMATED BLOOD LOSS: 10 mL.  IMPLANTS: Arthrex 2.6 mm knotless rotator cuff anchor x 1 for medial row Arthrex 4.75 mm swivel lock anchor x 2 for lateral row  DRAINS: None  COMPLICATIONS: None.  DESCRIPTION OF PROCEDURE: The patient was brought to the operating room and placed supine on the operating table.  The patient had been signed prior to the procedure and this was documented. The patient had the anesthesia placed by the anesthesiologist.  A time-out was performed to confirm that this was the  correct patient, site, side and location. The patient did receive antibiotics prior to the incision and was re-dosed during the procedure as needed at indicated intervals.  A tourniquet was not placed.  The patient had the operative extremity prepped and draped in the standard surgical fashion.      After obtaining informed consent the patient was brought to the operating table and underwent satisfactory anesthesia. An exam under anesthesia revealed full range of motion. He was placed in the left lateral decubitus position with an axillary roll and all bony prominences properly padded. A standard surgical timeout was performed. He was placed in 10 pounds of gentle in-line suspension.  Standard posterior and anterior superior portals were established. A diagnostic evaluation of the glenohumeral joint was performed. The biceps tendon was markedly synovitic and partially torn. It was tenotomized. An extensive debridement was performed of the superior anterior and posterior labrum. The articular surfaces revealed no significant chondromalacia .  Once in the subacromial space later we did also perform a arthroscopic bursectomy.  The subscapularis was intact. The rotator cuff was torn and the greater tuberosity footprint was prepared for repair. Releases were performed on the capsular surface of the rotator cuff.  No loose bodies noted.  No chondromalacia.  The arthroscope was inserted in the subacromial space and an additional lateral portal was established.  This was established at the 50 yard line of the [facility] joint about 4 cm distant from the acromion laterally.  An acromioplasty performed nicely decompressing the subacromial space with a motorized burr.  We then switched and viewed from the lateral portal while working for the posterior portal to perform a appropriate acromioplasty with a cutting block technique.  Bursitis in  the subacromial space was removed as well as releases were performed on the bursal  surface of the rotator cuff.    Next we prepared the greater tuberosity for rotator cuff repair with motorized shaver to elicit some petechial bleeding from the greater tuberosity.  We then noted that this was a crescent shaped tear that measured 2 cm x 1.5 cm from medial to lateral.  It had good mobilization to the lateral row.  Next, we placed a single 2.6 mm knotless rotator cuff fiber tack anchor at the articular margin.  We then passed 4 separate times through the myotendinous junction with antegrade suture passing device.  We then divided the sutures for 2 posterior lateral sutures into the 4.75 mm Arthrex swivel lock anchor.  Next the remaining 2 sutures were divided for the anterior lateral row anchor.  This was also a 4.75 mm swivel lock anchor.  This created nice compression of the tendon to bone.  Lastly we moved ahead with a distal clavicle resection.  While viewing from the lateral portal and working from the mid glenoid portal we introduced the radiofrequency wand into the [facility] joint.  We performed a subperiosteal dissection to identify a large inferior osteophyte.  This was debulked with the motorized bur.  Next we resected 1 cm of distal clavicle.  The arthroscope was then removed and portals closed with 4-0 Monocryl in standard fashion followed by a sterile occlusive dressing Polar Care ice sleeve and a slingshot sling. The patient was sent to recovery in stable condition and tolerated the procedure well  POSTOPERATIVE PLAN:   Timi will be discharged home today from PACU.  He will be in his sling at all times for the next 4 weeks.  He will be on the less than 3 cm rotator cuff repair protocol.

## 2023-06-25 ENCOUNTER — Ambulatory Visit (INDEPENDENT_AMBULATORY_CARE_PROVIDER_SITE_OTHER): Payer: PRIVATE HEALTH INSURANCE

## 2023-06-25 ENCOUNTER — Ambulatory Visit (HOSPITAL_COMMUNITY)
Admission: RE | Admit: 2023-06-25 | Discharge: 2023-06-25 | Disposition: A | Discharge status: Home / Self Care | Payer: PRIVATE HEALTH INSURANCE | Source: Ambulatory Visit | Attending: Vascular Surgery | Admitting: Vascular Surgery

## 2023-06-25 DIAGNOSIS — I6523 Occlusion and stenosis of bilateral carotid arteries: Secondary | ICD-10-CM

## 2023-06-25 DIAGNOSIS — I779 Disorder of arteries and arterioles, unspecified: Secondary | ICD-10-CM | POA: Diagnosis present

## 2023-06-25 NOTE — Progress Notes (Signed)
 HISTORY AND PHYSICAL     CC:  follow up. Requesting Provider:  Ava Lei, DO  HPI: This is a 73 y.o. male here for follow up for carotid artery stenosis.  Pt is s/p right CEA for asymptomatic carotid artery stenosis on 06/12/2021 by Dr. Vikki Graves.    Pt was last seen 05/08/2022 and at that time he was not having any neurological sx.    Pt returns today for follow up.    Pt denies any amaurosis fugax, speech difficulties, weakness, numbness, paralysis or clumsiness or facial droop.  He denies claudication, rest pain or non healing wounds.  He is compliant with his asa and statin.    He did undergo rotator cuff surgery this past Friday and right arm is in sling.  He has hx of CABG in 2022.   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  He is here with his wife of 21 years who also worked with EMS.    The pt is on a statin for cholesterol management.  The pt is on a daily aspirin .   Other AC:  none The pt is on ACEI, BB, diuretic for hypertension.   The pt is  on medication for diabetes Tobacco hx:  former    Past Medical History:  Diagnosis Date   ADHD (attention deficit hyperactivity disorder)    denies no meds   Anemia    Aspiration pneumonia (HCC)    Asthma    exersise induced   CAD (coronary artery disease)    a. s/p CABG 05/2020.   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   LIMA to LAD, SVG to OM, SVG to RCA    Past Surgical History:  Procedure Laterality Date   COLONOSCOPY WITH ESOPHAGOGASTRODUODENOSCOPY (EGD)     CORONARY ARTERY BYPASS GRAFT N/A  06/02/2020   Procedure: CORONARY ARTERY BYPASS GRAFTING (CABG) TIMES THREE USING RIGHT GREATER SAPHENOUS VEIN HARVESTED ENDOSCOPICALLY AND LEFT INTERNAL MAMMARY ARTERY.;  Surgeon: Gardenia Jump, MD;  Location: [facility] OR;  Service: Open Heart Surgery;  Laterality: N/A;   CORONARY ARTERY BYPASS GRAFT  06/02/2020   CYSTOSCOPY/RETROGRADE/URETEROSCOPY/STONE EXTRACTION WITH BASKET     ENDARTERECTOMY Right 06/12/2021   Procedure: RIGHT CAROTID ENDARTERECTOMY WITH PATCH ANGIOPLASTY;  Surgeon: Adine Hoof, MD;  Location: [facility] OR;  Service: Vascular;  Laterality: Right;   ESOPHAGECTOMY     Ivor-Lewis esophagectomy 1995 [facility]   IR THORACENTESIS ASP PLEURAL SPACE W/IMG GUIDE  06/06/2020   LEFT HEART CATH AND CORONARY ANGIOGRAPHY N/A 05/30/2020   Procedure: LEFT HEART CATH AND CORONARY ANGIOGRAPHY;  Surgeon: Swaziland, Peter M, MD;  Location: [facility] INVASIVE CV LAB;  Service: Cardiovascular;  Laterality: N/A;   partial esophageal ejectory     SHOULDER ARTHROSCOPY WITH ROTATOR CUFF REPAIR Left 11/09/2021   Procedure: SHOULDER ARTHROSCOPY WITH ROTATOR CUFF REPAIR BICEPS TENODESIS;  Surgeon: Janeth Medicus, MD;  Location: [facility] ORS;  Service: Orthopedics;  Laterality: Left;  120   SHOULDER OPEN ROTATOR CUFF REPAIR Left 10/26/2019   Procedure: ROTATOR CUFF REPAIR SHOULDER OPEN LEFT;  Surgeon: Darrin Emerald, MD;  Location: [facility] ORS;  Service: Orthopedics;  Laterality: Left;    Allergies  Allergen Reactions   Zithromax [Azithromycin] Diarrhea    Causes pt's stomach to tear up does not want to take medication again.     Current Outpatient Medications  Medication Sig Dispense Refill   acetaminophen  (TYLENOL ) 500 MG tablet Take 1,000 mg by mouth every 6 (six) hours as needed for moderate pain.     albuterol  (VENTOLIN  HFA) 108 (90 Base) MCG/ACT inhaler Inhale 1-2 puffs into the lungs every 6 (six) hours as needed for wheezing or shortness of breath.     aspirin  EC 81 MG EC tablet Take 1 tablet (81 mg  total) by mouth daily. Swallow whole. 30 tablet 11   B Complex Vitamins (VITAMIN B COMPLEX  PO) Take 1 tablet by mouth 2 (two) times daily.     Cholecalciferol  (VITAMIN D3 MAXIMUM STRENGTH) 125 MCG (5000 UT) capsule Take 5,000 Units by mouth 2 (two) times daily.     cyclobenzaprine (FLEXERIL) 5 MG tablet Take 5 mg by mouth 3 (three) times daily as needed.     diclofenac  sodium (VOLTAREN ) 1 % GEL Apply 2 g topically 2 (two) times daily as needed (pain).      esomeprazole  (NEXIUM ) 40 MG capsule Take 40 mg by mouth 2 (two) times daily.      ezetimibe  (ZETIA ) 10 MG tablet Take 1 tablet (10 mg total) by mouth daily. 90 tablet 1   furosemide  (LASIX ) 20 MG tablet Take 1 tablet (20 mg total) by mouth as needed for edema (swelling). 30 tablet 3   glipiZIDE  (GLUCOTROL  XL) 2.5 MG 24 hr tablet Take 2.5 mg by mouth daily with breakfast.     hydrocortisone 2.5 % cream Apply 1 application. topically 2 (two) times daily as needed for rash.     ketoconazole (NIZORAL) 2 % cream Apply 1 application. topically 2 (two) times daily as needed for irritation.     lisinopril  (ZESTRIL ) 2.5 MG tablet Take 1 tablet by mouth once daily 90 tablet 0   meloxicam (MOBIC) 15 MG tablet Take 15 mg by mouth daily.     metoprolol  tartrate (LOPRESSOR ) 100 MG tablet Take 1/2 (one-half) tablet by mouth twice daily 90 tablet 1   ondansetron  (ZOFRAN -ODT) 4 MG disintegrating tablet Take 1 tablet (4 mg total) by mouth every 8 (eight) hours as needed for nausea or vomiting. 20 tablet 0   oxyCODONE  (ROXICODONE ) 5 MG immediate release tablet Take 1 tablet (5 mg total) by mouth every 4 (four) hours as needed for moderate pain (pain score 4-6) or severe pain (pain score 7-10). 22 tablet 0   rosuvastatin  (CRESTOR ) 40 MG tablet TAKE 1 TABLET BY MOUTH ONCE DAILY AT 6PM 90 tablet 0   tamsulosin  (FLOMAX ) 0.4 MG CAPS capsule Take 1 capsule (0.4 mg total) by mouth daily. 7 capsule 0   No current facility-administered medications for this visit.     Family History  Problem Relation Age of Onset   Heart disease Mother    CAD Father    CVA Father     Social History   Socioeconomic History   Marital status: Married    Spouse name: Not on file   Number of children: Not on file   Years of education: Not on file   Highest education level: Not on file  Occupational  History   Not on file  Tobacco Use   Smoking status: Former    Current packs/day: 0.00    Average packs/day: 1 pack/day for 25.0 years (25.0 ttl pk-yrs)    Types: Cigarettes    Start date: 02/20/1968    Quit date: 02/19/1993    Years since quitting: 30.3    Passive exposure: Never   Smokeless tobacco: Never  Vaping Use   Vaping status: Never Used  Substance and Sexual Activity   Alcohol use: Yes    Comment: occ   Drug use: Never   Sexual activity: Yes  Other Topics Concern   Not on file  Social History Narrative   Not on file   Social Drivers of Health   Financial Resource Strain: Low Risk  (10/09/2019)   Received from [facility]   Overall Financial Resource Strain (CARDIA)    Difficulty of Paying Living Expenses: Not very hard  Food Insecurity: No Food Insecurity (01/17/2022)   Hunger Vital Sign    Worried About Running Out of Food in the Last Year: Never true    Ran Out of Food in the Last Year: Never true  Transportation Needs: No Transportation Needs (01/17/2022)   PRAPARE - Administrator, Civil Service (Medical): No    Lack of Transportation (Non-Medical): No  Physical Activity: Insufficiently Active (10/09/2019)   Received from [facility]   Exercise Vital Sign    On average, how many days per week do you engage in moderate to strenuous exercise (like a brisk walk)?: 2 days    On average, how many minutes do you engage in exercise at this level?: 10 min  Stress: No Stress Concern Present (10/09/2019)   Received from [facility] -  Occupational Stress Questionnaire    Feeling of Stress : Only a little  Social Connections: Not on file  Intimate Partner Violence: Not At Risk (01/17/2022)   Humiliation, Afraid, Rape, and Kick questionnaire    Fear of Current or Ex-Partner: No    Emotionally Abused: No    Physically Abused: No    Sexually Abused: No     REVIEW OF SYSTEMS:   [X]  denotes positive finding, [ ]  denotes negative finding Cardiac  Comments:  Chest pain or chest pressure:    Shortness of breath upon exertion:    Short of breath when lying flat:    Irregular heart rhythm:        Vascular    Pain in calf, thigh, or hip brought on by ambulation:    Pain in feet at night that wakes you up from your sleep:     Blood clot in your veins:    Leg swelling:         Pulmonary    Oxygen at home:    Productive cough:     Wheezing:         Neurologic    Sudden weakness in arms or legs:     Sudden numbness in arms or legs:     Sudden onset of difficulty speaking or slurred speech:    Temporary loss of vision in one eye:     Problems with dizziness:         Gastrointestinal    Blood in stool:     Vomited blood:         Genitourinary    Burning when urinating:     Blood in  urine:        Psychiatric    Major depression:         Hematologic    Bleeding problems:    Problems with blood clotting too easily:        Skin    Rashes or ulcers:        Constitutional    Fever or chills:      PHYSICAL EXAMINATION:   General:  WDWN in NAD; vital signs documented above Gait: Not observed HENT: WNL, normocephalic Pulmonary: normal non-labored breathing Cardiac: regular HR, without carotid bruits Skin: without rashes Vascular Exam/Pulses: Bilateral radial pulses are palpable  Left PT pulse palpable right PT pulse faintly palpable Extremities: without open wounds Musculoskeletal: no muscle wasting or atrophy  Neurologic: A&O X 3; moving all extremities equally; speech is fluent/normal Psychiatric:   The pt has Normal affect.   Non-Invasive Vascular Imaging:   Carotid Duplex on 06/25/2023 Right:  patent CEA with mural thrombus along patch site with no significant stenosis Left:  1-39% ICA stenosis Vertebrals:  Bilateral vertebral arteries demonstrate antegrade flow.  Subclavians: Normal flow hemodynamics were seen in bilateral subclavian arteries.   Previous Carotid duplex on 05/08/2022: Right: patent ICA Left:   1-39% ICA stenosis    ASSESSMENT/PLAN:: 73 y.o. male here for follow up carotid artery stenosis and has hx of right CEA for asymptomatic carotid artery stenosis on 06/12/2021 by Dr. Vikki Graves.    -duplex today reveals patent right CEA site with mural thrombus along patch without stenosis.  Discussed with Dr. Vikki Graves and advised pt to continue his daily aspirin .  Left remains 1-39% -discussed s/s of stroke with pt and he understands should he develop any of these sx, he will go to the nearest ER or call 911. -pt will f/u in one year with carotid duplex -pt will call sooner should he have any issues. -continue statin/asa    Maryanna Smart, [facility] [phone]  Clinic MD:  Vikki Graves

## 2023-06-27 ENCOUNTER — Encounter (HOSPITAL_COMMUNITY): Payer: Self-pay | Admitting: Orthopedic Surgery

## 2023-06-29 ENCOUNTER — Other Ambulatory Visit: Payer: Self-pay | Admitting: Cardiology

## 2023-07-26 ENCOUNTER — Other Ambulatory Visit: Payer: Self-pay | Admitting: Cardiology

## 2023-08-16 ENCOUNTER — Other Ambulatory Visit: Payer: Self-pay | Admitting: Cardiology

## 2023-08-28 ENCOUNTER — Encounter: Payer: Self-pay | Admitting: *Deleted

## 2023-08-28 ENCOUNTER — Encounter: Payer: Self-pay | Admitting: Cardiology

## 2023-08-28 ENCOUNTER — Ambulatory Visit: Payer: PRIVATE HEALTH INSURANCE | Attending: Cardiology | Admitting: Cardiology

## 2023-08-28 VITALS — BP 114/72 | HR 64 | Ht 65.0 in | Wt 163.8 lb

## 2023-08-28 DIAGNOSIS — I779 Disorder of arteries and arterioles, unspecified: Secondary | ICD-10-CM | POA: Diagnosis present

## 2023-08-28 DIAGNOSIS — I2510 Atherosclerotic heart disease of native coronary artery without angina pectoris: Secondary | ICD-10-CM | POA: Insufficient documentation

## 2023-08-28 DIAGNOSIS — E782 Mixed hyperlipidemia: Secondary | ICD-10-CM | POA: Insufficient documentation

## 2023-08-28 DIAGNOSIS — I10 Essential (primary) hypertension: Secondary | ICD-10-CM | POA: Diagnosis present

## 2023-08-28 NOTE — Progress Notes (Signed)
 Clinical Summary James Wolf is a 73 y.o.male seen today for follow up of the following medical problems.    1.CAD - admit 05/2020 with NSTEMI - cardiac catheterization by Dr. Swaziland with critical left main and multivessel CAD. He underwent CABG x3 (LIMA-LAD, SVG-OM, SVG-distal RCA)     - no recent chest pains, no SOB/DOE - compliant with meds       2. HTN - compliant with meds     3. Hyperlipidemia - labs followed by pcp 07/2021 TC 98 TG 51 TG 72 LDL 35 - reports more recent labs with pcp   4. Carotid stenosis 06/2021 right CEA  - followed by vascular - recent visit 05/2023         5. COPD -admit Jan 2024 with aspiration pneumonitis, COPD exacerbation   6. Prior bariartic surgery     7.Syncope - ER evaluation 07/2021 -from EMS eval low bp's and HRs -episode after iron infusion thought to be reaction  - no recurrent episodes.     8. Shoulder surgery 06/2023   Past Medical History:  Diagnosis Date   ADHD (attention deficit hyperactivity disorder)    denies no meds   Anemia    Aspiration pneumonia (HCC)    Asthma    exersise induced   CAD (coronary artery disease)    a. s/p CABG 05/2020.   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      Current Outpatient Medications  Medication Sig Dispense Refill   acetaminophen  (TYLENOL ) 500 MG tablet Take 1,000 mg  by mouth every 6 (six) hours as needed for moderate pain.     albuterol  (VENTOLIN  HFA) 108 (90 Base) MCG/ACT inhaler Inhale 1-2 puffs into the lungs every 6 (six) hours as needed for wheezing or shortness of breath.     aspirin  EC 81 MG EC tablet Take 1 tablet (81 mg total) by mouth daily. Swallow whole. 30 tablet 11   B Complex Vitamins (VITAMIN B COMPLEX  PO) Take 1 tablet by mouth 2 (two) times daily.     Cholecalciferol  (VITAMIN D3 MAXIMUM STRENGTH) 125 MCG (5000 UT) capsule Take 5,000 Units by mouth 2 (two) times daily.     cyclobenzaprine (FLEXERIL) 5 MG tablet Take 5 mg by mouth 3 (three) times daily as needed.     diclofenac  sodium (VOLTAREN ) 1 % GEL Apply 2 g topically 2 (two) times daily as needed (pain).      esomeprazole  (NEXIUM ) 40 MG capsule Take 40 mg by mouth 2 (two) times daily.      ezetimibe  (ZETIA ) 10 MG tablet Take 1 tablet by mouth once daily 30 tablet 1   furosemide  (LASIX ) 20 MG tablet Take 1 tablet (20  mg total) by mouth as needed for edema (swelling). 30 tablet 3   glipiZIDE  (GLUCOTROL  XL) 2.5 MG 24 hr tablet Take 2.5 mg by mouth daily with breakfast.     hydrocortisone 2.5 % cream Apply 1 application. topically 2 (two) times daily as needed for rash.     ketoconazole (NIZORAL) 2 % cream Apply 1 application. topically 2 (two) times daily as needed for irritation.     lisinopril  (ZESTRIL ) 2.5 MG tablet Take 1 tablet by mouth once daily 90 tablet 0   meloxicam (MOBIC) 15 MG tablet Take 15 mg by mouth daily.     metoprolol  tartrate (LOPRESSOR ) 100 MG tablet Take 1/2 (one-half) tablet by mouth twice daily 90 tablet 2   ondansetron  (ZOFRAN -ODT) 4 MG disintegrating tablet Take 1 tablet (4 mg total) by mouth every 8 (eight) hours as needed for nausea or vomiting. 20 tablet 0   oxyCODONE  (ROXICODONE ) 5 MG immediate release tablet Take 1 tablet (5 mg total) by mouth every 4 (four) hours as needed for moderate pain (pain score 4-6) or severe pain (pain score 7-10). 22 tablet 0    rosuvastatin  (CRESTOR ) 40 MG tablet TAKE 1 TABLET BY MOUTH ONCE DAILY AT  6 PM 90 tablet 0   tamsulosin  (FLOMAX ) 0.4 MG CAPS capsule Take 1 capsule (0.4 mg total) by mouth daily. 7 capsule 0   No current facility-administered medications for this visit.     Past Surgical History:  Procedure Laterality Date   COLONOSCOPY WITH ESOPHAGOGASTRODUODENOSCOPY (EGD)     CORONARY ARTERY BYPASS GRAFT N/A 06/02/2020   Procedure: CORONARY ARTERY BYPASS GRAFTING (CABG) TIMES THREE USING RIGHT GREATER SAPHENOUS VEIN HARVESTED ENDOSCOPICALLY AND LEFT INTERNAL MAMMARY ARTERY.;  Surgeon: Dusty Sudie DEL, MD;  Location: [facility] OR;  Service: Open Heart Surgery;  Laterality: N/A;   CORONARY ARTERY BYPASS GRAFT  06/02/2020   CYSTOSCOPY/RETROGRADE/URETEROSCOPY/STONE EXTRACTION WITH BASKET     ENDARTERECTOMY Right 06/12/2021   Procedure: RIGHT CAROTID ENDARTERECTOMY WITH PATCH ANGIOPLASTY;  Surgeon: Sheree Penne Bruckner, MD;  Location: [facility];  Service: Vascular;  Laterality: Right;   ESOPHAGECTOMY     Ivor-Lewis esophagectomy 1995 [facility]   IR THORACENTESIS ASP PLEURAL SPACE W/IMG GUIDE  06/06/2020   LEFT HEART CATH AND CORONARY ANGIOGRAPHY N/A 05/30/2020   Procedure: LEFT HEART CATH AND CORONARY ANGIOGRAPHY;  Surgeon: Swaziland, Peter M, MD;  Location: [facility] INVASIVE CV LAB;  Service: Cardiovascular;  Laterality: N/A;   partial esophageal ejectory     SHOULDER ARTHROSCOPY WITH ROTATOR CUFF REPAIR Left 11/09/2021   Procedure: SHOULDER ARTHROSCOPY WITH ROTATOR CUFF REPAIR BICEPS TENODESIS;  Surgeon: Sharl Selinda Dover, MD;  Location: [facility];  Service: Orthopedics;  Laterality: Left;  120   SHOULDER ARTHROSCOPY WITH ROTATOR CUFF REPAIR Right 06/20/2023   Procedure: ARTHROSCOPY, SHOULDER, WITH ROTATOR CUFF REPAIR;  Surgeon: Sharl Selinda Dover, MD;  Location: [facility];  Service: Orthopedics;  Laterality: Right;   SHOULDER OPEN ROTATOR CUFF REPAIR Left 10/26/2019   Procedure: ROTATOR CUFF REPAIR SHOULDER OPEN LEFT;  Surgeon:  Margrette Taft BRAVO, MD;  Location: [facility];  Service: Orthopedics;  Laterality: Left;   SUBACROMIAL DECOMPRESSION Right 06/20/2023   Procedure: DECOMPRESSION, SUBACROMIAL SPACE;  Surgeon: Sharl Selinda Dover, MD;  Location: [facility];  Service: Orthopedics;  Laterality: Right;     Allergies  Allergen Reactions   Zithromax [Azithromycin] Diarrhea    Causes pt's stomach to tear up does not want to take medication again.       Family History  Problem Relation Age of  Onset   Heart disease Mother    CAD Father    CVA Father      Social History Mr. Campione reports that he quit smoking about 30 years ago. His smoking use included cigarettes. He started smoking about 55 years ago. He has a 25 pack-year smoking history. He has never been exposed to tobacco smoke. He has never used smokeless tobacco. Mr. Cubit reports current alcohol use.    Physical Examination Today's Vitals   08/28/23 1526  BP: 114/72  Pulse: 64  SpO2: 95%  Weight: 163 lb 12.8 oz (74.3 kg)  Height: 5' 5 (1.651 m)   Body mass index is 27.26 kg/m.  Gen: resting comfortably, no acute distress HEENT: no scleral icterus, pupils equal round and reactive, no palptable cervical adenopathy,  CV: RRR, no m/rg, no jvd Resp: Clear to auscultation bilaterally GI: abdomen is soft, non-tender, non-distended, normal bowel sounds, no hepatosplenomegaly MSK: extremities are warm, no edema.  Skin: warm, no rash Neuro:  no focal deficits Psych: appropriate affect    Assessment and Plan  CAD - no symptoms, continue current meds   2. Carotid stenosis - s/p CEA, followed by vascular - continue medical therapy   3. Hyperlipidemia - continue current meds, we will request pcp labs   4.HTN - bp is at goal, continue current meds      Dorn PHEBE Ross, M.D.

## 2023-08-28 NOTE — Patient Instructions (Signed)
 Medication Instructions:  Continue all current medications.   Labwork: none  Testing/Procedures: none  Follow-Up: 6 months   Any Other Special Instructions Will Be Listed Below (If Applicable).   If you need a refill on your cardiac medications before your next appointment, please call your pharmacy.

## 2023-09-26 ENCOUNTER — Emergency Department (HOSPITAL_COMMUNITY)
Admission: EM | Admit: 2023-09-26 | Discharge: 2023-09-27 | Disposition: A | Discharge status: Home / Self Care | Attending: Emergency Medicine | Admitting: Emergency Medicine

## 2023-09-26 ENCOUNTER — Encounter (HOSPITAL_COMMUNITY): Payer: Self-pay

## 2023-09-26 ENCOUNTER — Other Ambulatory Visit: Payer: Self-pay | Admitting: Cardiology

## 2023-09-26 ENCOUNTER — Emergency Department (HOSPITAL_COMMUNITY)

## 2023-09-26 DIAGNOSIS — N200 Calculus of kidney: Secondary | ICD-10-CM

## 2023-09-26 DIAGNOSIS — Z7982 Long term (current) use of aspirin: Secondary | ICD-10-CM | POA: Diagnosis not present

## 2023-09-26 DIAGNOSIS — R109 Unspecified abdominal pain: Secondary | ICD-10-CM | POA: Diagnosis present

## 2023-09-26 DIAGNOSIS — N179 Acute kidney failure, unspecified: Secondary | ICD-10-CM | POA: Insufficient documentation

## 2023-09-26 DIAGNOSIS — N132 Hydronephrosis with renal and ureteral calculous obstruction: Secondary | ICD-10-CM | POA: Insufficient documentation

## 2023-09-26 LAB — BASIC METABOLIC PANEL WITH GFR
Anion gap: 10 (ref 5–15)
BUN: 15 mg/dL (ref 8–23)
CO2: 27 mmol/L (ref 22–32)
Calcium: 9.2 mg/dL (ref 8.9–10.3)
Chloride: 104 mmol/L (ref 98–111)
Creatinine, Ser: 1.36 mg/dL — ABNORMAL HIGH (ref 0.61–1.24)
GFR, Estimated: 55 mL/min — ABNORMAL LOW (ref >60)
Glucose, Bld: 121 mg/dL — ABNORMAL HIGH (ref 70–99)
Potassium: 4 mmol/L (ref 3.5–5.1)
Sodium: 141 mmol/L (ref 135–145)

## 2023-09-26 LAB — URINALYSIS, ROUTINE W REFLEX MICROSCOPIC
Bacteria, UA: NONE SEEN
Bilirubin Urine: NEGATIVE
Glucose, UA: NEGATIVE mg/dL
Hgb urine dipstick: NEGATIVE
Ketones, ur: NEGATIVE mg/dL
Leukocytes,Ua: NEGATIVE
Nitrite: NEGATIVE
Protein, ur: 30 mg/dL — AB
RBC / HPF: >50 RBC/hpf (ref 0–5)
Specific Gravity, Urine: 1.026 (ref 1.005–1.030)
pH: 5 (ref 5.0–8.0)

## 2023-09-26 LAB — CBC
HCT: 37.2 % — ABNORMAL LOW (ref 39.0–52.0)
Hemoglobin: 12.1 g/dL — ABNORMAL LOW (ref 13.0–17.0)
MCH: 31.5 pg (ref 26.0–34.0)
MCHC: 32.5 g/dL (ref 30.0–36.0)
MCV: 96.9 fL (ref 80.0–100.0)
Platelets: 167 K/uL (ref 150–400)
RBC: 3.84 MIL/uL — ABNORMAL LOW (ref 4.22–5.81)
RDW: 13 % (ref 11.5–15.5)
WBC: 6.9 K/uL (ref 4.0–10.5)
nRBC: 0 % (ref 0.0–0.2)

## 2023-09-26 MED ORDER — ONDANSETRON 8 MG PO TBDP
8.0000 mg | ORAL_TABLET | Freq: Once | ORAL | Status: AC
  Administered 2023-09-27: 8 mg via ORAL
  Filled 2023-09-26: qty 1

## 2023-09-26 MED ORDER — KETOROLAC TROMETHAMINE 60 MG/2ML IM SOLN
30.0000 mg | Freq: Once | INTRAMUSCULAR | Status: AC
  Administered 2023-09-27: 30 mg via INTRAMUSCULAR
  Filled 2023-09-26: qty 2

## 2023-09-26 MED ORDER — OXYCODONE-ACETAMINOPHEN 5-325 MG PO TABS
2.0000 | ORAL_TABLET | Freq: Once | ORAL | Status: AC
  Administered 2023-09-27: 2 via ORAL
  Filled 2023-09-26: qty 2

## 2023-09-26 NOTE — ED Triage Notes (Signed)
 Pt stated that he is having left flank pain. Hx of kidney stones and stated that he knows he has a stone. Denies urinary symptoms. Had 25 + stones in the last 25 years

## 2023-09-27 DIAGNOSIS — N179 Acute kidney failure, unspecified: Secondary | ICD-10-CM | POA: Diagnosis not present

## 2023-09-27 MED ORDER — OXYCODONE-ACETAMINOPHEN 5-325 MG PO TABS: 1.0000 | ORAL_TABLET | Freq: Four times a day (QID) | ORAL | 0 refills | Status: AC | PRN

## 2023-09-27 MED ORDER — ONDANSETRON HCL 4 MG PO TABS: 4.0000 mg | ORAL_TABLET | Freq: Three times a day (TID) | ORAL | 0 refills | Status: AC | PRN

## 2023-09-27 MED ORDER — ONDANSETRON 4 MG PO TBDP: 4.0000 mg | ORAL_TABLET | Freq: Three times a day (TID) | ORAL | 0 refills | Status: AC | PRN

## 2023-09-27 MED ORDER — OXYCODONE-ACETAMINOPHEN 5-325 MG PO TABS: 1.0000 | ORAL_TABLET | Freq: Three times a day (TID) | ORAL | 0 refills | Status: AC | PRN

## 2023-09-27 NOTE — ED Provider Notes (Signed)
 [place] EMERGENCY DEPARTMENT AT [facility] Provider Note   CSN: [id number] Arrival date & time: 09/26/23  2101     Patient presents with: Flank Pain   James Wolf is a 73 y.o. male.   The patient is presenting with left leg and left hip pain, suspected to be related to kidney stones. He has a history of recurrent kidney stones and typically manages them independently unless the pain becomes severe. The patient reports no issues with urination or changes in urine color. He is currently experiencing significant discomfort and is taking Flomax  daily. He has a history of rotator cuff surgery and is unsure about the use of anti-inflammatory medications post-surgery. The patient describes a previous experience of passing a large stone, measuring seven by nine millimeters, but acknowledges that passing a 10-millimeter stone, as identified in a recent CT scan, is uncommon without intervention. History was obtained from the patient.   Flank Pain       Prior to Admission medications   Medication Sig Start Date End Date Taking? Authorizing Provider  ondansetron  (ZOFRAN ) 4 MG tablet Take 1 tablet (4 mg total) by mouth every 8 (eight) hours as needed for nausea or vomiting. 09/27/23  Yes Obrien Huskins, Selinda, MD  ondansetron  (ZOFRAN -ODT) 4 MG disintegrating tablet Take 1 tablet (4 mg total) by mouth every 8 (eight) hours as needed for vomiting. 09/27/23  Yes Jaiyah Beining, Selinda, MD  oxyCODONE -acetaminophen  (PERCOCET/ROXICET) 5-325 MG tablet Take 1-2 tablets by mouth every 8 (eight) hours as needed for severe pain (pain score 7-10). 09/27/23  Yes Jeremie Abdelaziz, Selinda, MD  oxyCODONE -acetaminophen  (PERCOCET/ROXICET) 5-325 MG tablet Take 1 tablet by mouth every 6 (six) hours as needed for severe pain (pain score 7-10). 09/27/23  Yes Ibn Stief, Selinda, MD  acetaminophen  (TYLENOL ) 500 MG tablet Take 1,000 mg by mouth every 6 (six) hours as needed for moderate pain.    [provider]  albuterol  (VENTOLIN  HFA)  108 (90 Base) MCG/ACT inhaler Inhale 1-2 puffs into the lungs every 6 (six) hours as needed for wheezing or shortness of breath.    [provider]  aspirin  EC 81 MG EC tablet Take 1 tablet (81 mg total) by mouth daily. Swallow whole. 06/08/20   Roddenberry, Myron G, PA-C  B Complex Vitamins (VITAMIN B COMPLEX  PO) Take 1 tablet by mouth 2 (two) times daily. 03/10/07   [provider]  Cholecalciferol  (VITAMIN D3 MAXIMUM STRENGTH) 125 MCG (5000 UT) capsule Take 5,000 Units by mouth 2 (two) times daily.    [provider]  cyclobenzaprine (FLEXERIL) 5 MG tablet Take 5 mg by mouth 3 (three) times daily as needed.    [provider]  diclofenac  sodium (VOLTAREN ) 1 % GEL Apply 2 g topically 2 (two) times daily as needed (pain).  09/24/15   [provider]  esomeprazole  (NEXIUM ) 40 MG capsule Take 40 mg by mouth 2 (two) times daily.  05/26/17   [provider]  ezetimibe  (ZETIA ) 10 MG tablet TAKE 1 TABLET BY MOUTH ONCE DAILY. APPOINTMENT REQUIRED FOR FUTURE REFILLS 09/26/23   Alvan Dorn FALCON, MD  furosemide  (LASIX ) 20 MG tablet Take 1 tablet (20 mg total) by mouth as needed for edema (swelling). 05/21/22   Alvan Dorn FALCON, MD  glipiZIDE  (GLUCOTROL  XL) 2.5 MG 24 hr tablet Take 2.5 mg by mouth daily with breakfast. 03/15/21   [provider]  hydrocortisone 2.5 % cream Apply 1 application. topically 2 (two) times daily as needed for rash. 05/29/21   [provider]  ketoconazole (NIZORAL) 2 % cream Apply 1 application. topically 2 (two) times daily as needed for irritation. 05/29/21   [provider]  lisinopril  (ZESTRIL ) 2.5 MG tablet Take 1 tablet by mouth once daily 08/19/23   Alvan Dorn FALCON, MD  meloxicam (MOBIC) 15 MG tablet Take 15 mg by mouth daily.    [provider]  metoprolol  tartrate (LOPRESSOR ) 100 MG tablet Take 1/2 (one-half) tablet by mouth twice daily 06/30/23   Alvan Dorn FALCON, MD  rosuvastatin  (CRESTOR )  40 MG tablet TAKE 1 TABLET BY MOUTH ONCE DAILY AT  6 PM 08/19/23   Alvan Dorn FALCON, MD  tamsulosin  (FLOMAX ) 0.4 MG CAPS capsule Take 1 capsule (0.4 mg total) by mouth daily. 03/05/22   Theadore Ozell HERO, MD    Allergies: Zithromax [azithromycin]    Review of Systems  Genitourinary:  Positive for flank pain.    Updated Vital Signs BP 103/65   Pulse 73   Temp (!) 97.5 F (36.4 C)   Resp 15   Ht 5' 5 (1.651 m)   Wt 77.1 kg   SpO2 96%   BMI 28.29 kg/m   Physical Exam Vitals and nursing note reviewed.  Constitutional:      Appearance: He is well-developed.     Comments: Looks uncomfortable, but no distress  HENT:     Head: Normocephalic and atraumatic.  Cardiovascular:     Rate and Rhythm: Normal rate.  Pulmonary:     Effort: Pulmonary effort is normal. No respiratory distress.  Abdominal:     General: There is no distension.  Musculoskeletal:        General: Normal range of motion.     Cervical back: Normal range of motion.  Neurological:     Mental Status: He is alert.     (all labs ordered are listed, but only abnormal results are displayed) Labs Reviewed  URINALYSIS, ROUTINE W REFLEX MICROSCOPIC - Abnormal; Notable for the following components:      Result Value   Color, Urine AMBER (*)    APPearance CLOUDY (*)    Protein, ur 30 (*)    All other components within normal limits  BASIC METABOLIC PANEL WITH GFR - Abnormal; Notable for the following components:   Glucose, Bld 121 (*)    Creatinine, Ser 1.36 (*)    GFR, Estimated 55 (*)    All other components within normal limits  CBC - Abnormal; Notable for the following components:   RBC 3.84 (*)    Hemoglobin 12.1 (*)    HCT 37.2 (*)    All other components within normal limits    EKG: None  Radiology: CT Renal Stone Study Result Date: 09/26/2023 CLINICAL DATA:  Left flank pain EXAM: CT ABDOMEN AND PELVIS WITHOUT CONTRAST TECHNIQUE: Multidetector CT imaging of the abdomen and pelvis was performed  following the standard protocol without IV contrast. RADIATION DOSE REDUCTION: This exam was performed according to the departmental dose-optimization program which includes automated exposure control, adjustment of the mA and/or kV according to patient size and/or use of iterative reconstruction technique. COMPARISON:  03/05/2022 FINDINGS: Lower chest: Stable bibasilar fibrotic changes. Large hiatal hernia with the majority of the stomach in an intrathoracic location. Hepatobiliary: Small gallstones without cholecystitis. No biliary duct dilation. Unremarkable unenhanced appearance of the liver. Pancreas: Unremarkable unenhanced appearance. Spleen: Unremarkable unenhanced appearance. Adrenals/Urinary Tract: Multiple left renal calculi, largest in the left renal pelvis near the UPJ measuring 10 mm. Mild left hydronephrosis primarily within  the upper pole. No right-sided calculi or right-sided obstruction. Distal ureters and bladder appear unremarkable. The adrenals are normal. Stomach/Bowel: No bowel obstruction or ileus. Distal colonic diverticulosis without diverticulitis. No bowel wall thickening or inflammatory change. Large hiatal hernia as above. Vascular/Lymphatic: 3 cm infrarenal abdominal aortic aneurysm. Assessment of the vascular lumen cannot be performed without intravenous contrast. Diffuse aortic atherosclerosis. No pathologic adenopathy. Reproductive: Prostate is unremarkable.  Small bilateral hydroceles. Other: No free fluid or free intraperitoneal gas. Small fat containing umbilical hernia. Musculoskeletal: No acute or destructive bony abnormalities. Progressive compression deformity inferior endplate L2 vertebral body, with approximately 25% loss of height. Reconstructed images demonstrate no additional findings. IMPRESSION: 1. Obstructing 10 mm calculus within the left renal pelvis, with mild hydronephrosis involving the upper pole calices. 2. Distal colonic diverticulosis without diverticulitis.  3. Large hiatal hernia. 4. Abdominal aortic aneurysm measuring 3 cm. Recommend surveillance ultrasound in 3 years. Reference: Journal of Vascular Surgery 67.1 (2018): 2-77. J Am Coll Radiol 2013;10:789-794. 5.  Aortic Atherosclerosis (ICD10-I70.0). Electronically Signed   By: Ozell Daring M.D.   On: 09/26/2023 23:03     Procedures   Medications Ordered in the ED  oxyCODONE -acetaminophen  (PERCOCET/ROXICET) 5-325 MG per tablet 2 tablet (2 tablets Oral Given 09/27/23 0101)  ondansetron  (ZOFRAN -ODT) disintegrating tablet 8 mg (8 mg Oral Given 09/27/23 0013)  ketorolac  (TORADOL ) injection 30 mg (30 mg Intramuscular Given 09/27/23 0015)                                    Medical Decision Making Amount and/or Complexity of Data Reviewed Labs: ordered.  Risk Prescription drug management.   ED Course: The patient presented to the ED with left leg and hip pain, suspected to be related to a kidney stone. The patient has a history of passing kidney stones and is under the care of a urologist. A CT scan confirmed the presence of a 10 mm stone in the renal pelvis. The primary focus is on pain and symptom control. The patient reported no nausea. The plan includes administering pain and nausea medications, possibly fluids, and considering oral medications initially. If oral medications are insufficient, IV medications will be administered. The patient is recovering from rotator cuff surgery and has not taken any anti-inflammatories.  Differential Diagnosis: Differential diagnosis includes but is not limited to: nephrolithiasis, musculoskeletal pain, and referred pain from other abdominal or pelvic conditions.  Diagnostics Review  Imaging Interpretation (Interpreted by me): CT scan shows a 10 mm stone in the renal pelvis.  Oral medications given here with resolution of pain and nausea, prepack for same given. Rx's sent.   Potential for Clinical Deterioration: Yes, due to the size of the kidney stone  and potential for obstruction or increased pain if not managed effectively.  High Risk of Morbidity/Mortality Consideration: No immediate high risk of morbidity or mortality, but potential complications from untreated kidney stones include obstruction and infection.  Clinical Impression & Plan  Primary Diagnosis: Nephrolithiasis (kidney stone)  Disposition: Discharge. The patient will be discharged with pain management and follow-up with a urologist for further evaluation and potential intervention. Admission was considered but not pursued due to the plan for outpatient management and follow-up with a specialist.     Final diagnoses:  Kidney stone  AKI (acute kidney injury) [facility])    ED Discharge Orders          Ordered    ondansetron  (ZOFRAN -ODT)  4 MG disintegrating tablet  Every 8 hours PRN        09/27/23 0213    oxyCODONE -acetaminophen  (PERCOCET/ROXICET) 5-325 MG tablet  Every 8 hours PRN        09/27/23 0213    oxyCODONE -acetaminophen  (PERCOCET/ROXICET) 5-325 MG tablet  Every 6 hours PRN        09/27/23 0213    ondansetron  (ZOFRAN ) 4 MG tablet  Every 8 hours PRN        09/27/23 0213               Marianna Cid, MD 09/27/23 [phone]

## 2023-09-29 MED FILL — Oxycodone w/ Acetaminophen Tab 5-325 MG: ORAL | Qty: 6 | Status: AC

## 2023-09-29 MED FILL — Ondansetron HCl Tab 4 MG: ORAL | Qty: 4 | Status: AC

## 2023-10-03 ENCOUNTER — Telehealth (HOSPITAL_BASED_OUTPATIENT_CLINIC_OR_DEPARTMENT_OTHER): Payer: Self-pay | Admitting: *Deleted

## 2023-10-03 NOTE — Telephone Encounter (Signed)
 Dr. Alvan Lanius saw this patient on 08/28/2023. Per protocol we request that you comment on his cardiac risk to proceed with  EXTRACORPOREAL SHOCKWAVE LITHOTRIPSY since it has been less than 2 months since evaluated in the office. Please send your comment to P CV Pre-Op Pool.  Thank you, Lamarr Satterfield DNP, ANP, AACC.

## 2023-10-03 NOTE — Telephone Encounter (Signed)
   Pre-operative Risk Assessment    Patient Name: James Wolf  DOB: 12/23/1950 MRN: [id number]   Date of last office visit: 08/28/23 DR. DOROTHA ROSS Date of next office visit: NONE   Request for Surgical Clearance    Procedure:  EXTRACORPOREAL SHOCKWAVE LITHOTRIPSY  Date of Surgery:  Clearance 10/22/23                                Surgeon:  DR. BRIEN  Surgeon's Group or Practice Name:  [facility] Phone number:  [phone] Fax number:  [phone]; ATTN: JODY   Type of Clearance Requested:   - Medical  - Pharmacy:  Hold Aspirin      Type of Anesthesia:  Not Indicated   Additional requests/questions:    [name]   10/03/2023, 7:55 AM

## 2023-10-06 NOTE — Telephone Encounter (Signed)
 Ok to proceed with procedure from cardiac standpoint, if needed can hold aspirin   James Ross MD

## 2023-10-06 NOTE — Telephone Encounter (Signed)
   Primary Cardiologist: Alvan Carrier, MD  Chart reviewed as part of pre-operative protocol coverage. Given past medical history and time since last visit, based on ACC/AHA guidelines, REMMY RIFFE would be at acceptable risk for the planned procedure without further cardiovascular testing.   Patient should contact our office if he is having new symptoms that are concerning from a cardiac perspective to arrange a follow-up appointment.    Per office protocol, he may hold aspirin  for 5-7 days prior to procedure and should resume as soon as hemodynamically stable postoperatively.  I will route this recommendation to the requesting party via Epic fax function and remove from pre-op pool.  Please call with questions.  Rosaline EMERSON Bane, NP-C 10/06/2023, 1:00 PM [address] [place], [place] [address] Office [phone] Fax [phone]

## 2023-10-07 NOTE — Telephone Encounter (Signed)
 Office called back to say they need a copy of patient last EKG. Please advise

## 2023-10-07 NOTE — Telephone Encounter (Signed)
 Will fax over last EKG to requesting office.

## 2023-10-31 ENCOUNTER — Encounter: Payer: Self-pay | Admitting: Cardiology

## 2023-11-09 ENCOUNTER — Other Ambulatory Visit: Payer: Self-pay | Admitting: Cardiology

## 2023-11-15 ENCOUNTER — Other Ambulatory Visit: Payer: Self-pay | Admitting: Cardiology

## 2024-03-09 ENCOUNTER — Ambulatory Visit: Payer: PRIVATE HEALTH INSURANCE | Admitting: Nurse Practitioner
